# Patient Record
Sex: Female | Born: 1955
Health system: Southern US, Community
[De-identification: ages and names within clinical notes are randomized; demographics above are authoritative.]

## PROBLEM LIST (undated history)

## (undated) DIAGNOSIS — F419 Anxiety disorder, unspecified: Secondary | ICD-10-CM

## (undated) DIAGNOSIS — I10 Essential (primary) hypertension: Secondary | ICD-10-CM

## (undated) DIAGNOSIS — L309 Dermatitis, unspecified: Secondary | ICD-10-CM

## (undated) DIAGNOSIS — M1990 Unspecified osteoarthritis, unspecified site: Secondary | ICD-10-CM

## (undated) DIAGNOSIS — M797 Fibromyalgia: Secondary | ICD-10-CM

## (undated) DIAGNOSIS — T7840XA Allergy, unspecified, initial encounter: Secondary | ICD-10-CM

## (undated) DIAGNOSIS — R011 Cardiac murmur, unspecified: Secondary | ICD-10-CM

## (undated) DIAGNOSIS — S83249A Other tear of medial meniscus, current injury, unspecified knee, initial encounter: Secondary | ICD-10-CM

## (undated) DIAGNOSIS — G43909 Migraine, unspecified, not intractable, without status migrainosus: Secondary | ICD-10-CM

## (undated) DIAGNOSIS — F329 Major depressive disorder, single episode, unspecified: Secondary | ICD-10-CM

## (undated) HISTORY — PX: FINGER SURGERY: SHX640

## (undated) HISTORY — DX: Major depressive disorder, single episode, unspecified: F32.9

## (undated) HISTORY — PX: JOINT REPLACEMENT: SHX530

## (undated) HISTORY — DX: Essential (primary) hypertension: I10

## (undated) HISTORY — DX: Unspecified osteoarthritis, unspecified site: M19.90

## (undated) HISTORY — PX: HERNIA REPAIR: SHX51

## (undated) HISTORY — DX: Dermatitis, unspecified: L30.9

## (undated) HISTORY — DX: Cardiac murmur, unspecified: R01.1

## (undated) HISTORY — DX: Allergy, unspecified, initial encounter: T78.40XA

---

## 1990-07-17 HISTORY — PX: TUBAL LIGATION: SHX77

## 1998-10-04 ENCOUNTER — Ambulatory Visit (HOSPITAL_COMMUNITY): Admission: RE | Admit: 1998-10-04 | Discharge: 1998-10-04 | Payer: Self-pay | Admitting: Preventative Medicine

## 1998-10-04 ENCOUNTER — Encounter: Payer: Self-pay | Admitting: Preventative Medicine

## 1998-10-18 ENCOUNTER — Ambulatory Visit (HOSPITAL_COMMUNITY): Admission: RE | Admit: 1998-10-18 | Discharge: 1998-10-18 | Payer: Self-pay | Admitting: Preventative Medicine

## 1998-10-18 ENCOUNTER — Encounter: Payer: Self-pay | Admitting: Preventative Medicine

## 1998-11-03 ENCOUNTER — Encounter: Payer: Self-pay | Admitting: Preventative Medicine

## 1998-11-03 ENCOUNTER — Ambulatory Visit (HOSPITAL_COMMUNITY): Admission: RE | Admit: 1998-11-03 | Discharge: 1998-11-03 | Payer: Self-pay | Admitting: Preventative Medicine

## 2000-07-17 DIAGNOSIS — F32A Depression, unspecified: Secondary | ICD-10-CM

## 2000-07-17 HISTORY — DX: Depression, unspecified: F32.A

## 2002-07-17 DIAGNOSIS — I10 Essential (primary) hypertension: Secondary | ICD-10-CM

## 2002-07-17 HISTORY — DX: Essential (primary) hypertension: I10

## 2003-01-15 ENCOUNTER — Ambulatory Visit (HOSPITAL_COMMUNITY): Admission: RE | Admit: 2003-01-15 | Discharge: 2003-01-15 | Payer: Self-pay | Admitting: Internal Medicine

## 2003-01-15 ENCOUNTER — Encounter: Payer: Self-pay | Admitting: Internal Medicine

## 2003-12-11 ENCOUNTER — Ambulatory Visit (HOSPITAL_COMMUNITY): Admission: RE | Admit: 2003-12-11 | Discharge: 2003-12-11 | Payer: Self-pay | Admitting: Family Medicine

## 2004-01-19 ENCOUNTER — Ambulatory Visit (HOSPITAL_COMMUNITY): Admission: RE | Admit: 2004-01-19 | Discharge: 2004-01-19 | Payer: Self-pay | Admitting: Family Medicine

## 2004-02-19 ENCOUNTER — Ambulatory Visit (HOSPITAL_COMMUNITY): Admission: RE | Admit: 2004-02-19 | Discharge: 2004-02-19 | Payer: Self-pay | Admitting: General Surgery

## 2004-02-19 LAB — HM COLONOSCOPY: HM Colonoscopy: NORMAL

## 2004-03-30 ENCOUNTER — Ambulatory Visit (HOSPITAL_COMMUNITY): Admission: RE | Admit: 2004-03-30 | Discharge: 2004-03-30 | Payer: Self-pay | Admitting: Family Medicine

## 2004-04-05 ENCOUNTER — Ambulatory Visit (HOSPITAL_COMMUNITY): Admission: RE | Admit: 2004-04-05 | Discharge: 2004-04-05 | Payer: Self-pay | Admitting: General Surgery

## 2004-05-24 ENCOUNTER — Ambulatory Visit: Payer: Self-pay | Admitting: Family Medicine

## 2004-06-23 ENCOUNTER — Ambulatory Visit: Payer: Self-pay | Admitting: Family Medicine

## 2004-07-06 ENCOUNTER — Ambulatory Visit: Payer: Self-pay | Admitting: Orthopedic Surgery

## 2004-08-03 ENCOUNTER — Ambulatory Visit: Payer: Self-pay | Admitting: Orthopedic Surgery

## 2004-08-24 ENCOUNTER — Ambulatory Visit: Payer: Self-pay | Admitting: Orthopedic Surgery

## 2004-09-15 ENCOUNTER — Ambulatory Visit: Payer: Self-pay | Admitting: Orthopedic Surgery

## 2004-09-21 ENCOUNTER — Ambulatory Visit: Payer: Self-pay | Admitting: Family Medicine

## 2004-09-23 ENCOUNTER — Ambulatory Visit (HOSPITAL_COMMUNITY): Admission: RE | Admit: 2004-09-23 | Discharge: 2004-09-23 | Payer: Self-pay | Admitting: Family Medicine

## 2004-10-03 ENCOUNTER — Ambulatory Visit: Payer: Self-pay | Admitting: Orthopedic Surgery

## 2004-10-06 ENCOUNTER — Encounter (HOSPITAL_COMMUNITY): Admission: RE | Admit: 2004-10-06 | Discharge: 2004-11-05 | Payer: Self-pay | Admitting: Orthopedic Surgery

## 2004-10-13 ENCOUNTER — Ambulatory Visit: Payer: Self-pay | Admitting: Orthopedic Surgery

## 2004-10-21 ENCOUNTER — Encounter: Admission: RE | Admit: 2004-10-21 | Discharge: 2004-10-21 | Payer: Self-pay | Admitting: Occupational Medicine

## 2004-11-16 ENCOUNTER — Ambulatory Visit: Payer: Self-pay | Admitting: Orthopedic Surgery

## 2004-11-25 ENCOUNTER — Ambulatory Visit: Payer: Self-pay | Admitting: Orthopedic Surgery

## 2004-11-25 ENCOUNTER — Ambulatory Visit (HOSPITAL_COMMUNITY): Admission: RE | Admit: 2004-11-25 | Discharge: 2004-11-25 | Payer: Self-pay | Admitting: Orthopedic Surgery

## 2004-11-25 HISTORY — PX: SHOULDER ARTHROSCOPY WITH ROTATOR CUFF REPAIR: SHX5685

## 2004-11-28 ENCOUNTER — Ambulatory Visit: Payer: Self-pay | Admitting: Orthopedic Surgery

## 2004-11-29 ENCOUNTER — Encounter (HOSPITAL_COMMUNITY): Admission: RE | Admit: 2004-11-29 | Discharge: 2004-12-14 | Payer: Self-pay | Admitting: Orthopedic Surgery

## 2004-12-02 ENCOUNTER — Ambulatory Visit: Payer: Self-pay | Admitting: Family Medicine

## 2004-12-08 ENCOUNTER — Ambulatory Visit: Payer: Self-pay | Admitting: Orthopedic Surgery

## 2004-12-13 ENCOUNTER — Ambulatory Visit: Payer: Self-pay | Admitting: Family Medicine

## 2004-12-14 ENCOUNTER — Ambulatory Visit (HOSPITAL_COMMUNITY): Admission: RE | Admit: 2004-12-14 | Discharge: 2004-12-14 | Payer: Self-pay | Admitting: Family Medicine

## 2004-12-19 ENCOUNTER — Encounter (HOSPITAL_COMMUNITY): Admission: RE | Admit: 2004-12-19 | Discharge: 2005-01-18 | Payer: Self-pay | Admitting: Orthopedic Surgery

## 2005-01-05 ENCOUNTER — Ambulatory Visit: Payer: Self-pay | Admitting: Orthopedic Surgery

## 2005-01-20 ENCOUNTER — Encounter (HOSPITAL_COMMUNITY): Admission: RE | Admit: 2005-01-20 | Discharge: 2005-02-19 | Payer: Self-pay | Admitting: Orthopedic Surgery

## 2005-02-02 ENCOUNTER — Ambulatory Visit: Payer: Self-pay | Admitting: Orthopedic Surgery

## 2005-03-16 ENCOUNTER — Ambulatory Visit: Payer: Self-pay | Admitting: Orthopedic Surgery

## 2005-04-04 ENCOUNTER — Ambulatory Visit: Payer: Self-pay | Admitting: Family Medicine

## 2005-04-04 ENCOUNTER — Encounter (INDEPENDENT_AMBULATORY_CARE_PROVIDER_SITE_OTHER): Payer: Self-pay | Admitting: *Deleted

## 2005-04-04 LAB — CONVERTED CEMR LAB: Pap Smear: NORMAL

## 2005-06-28 ENCOUNTER — Ambulatory Visit (HOSPITAL_COMMUNITY): Admission: RE | Admit: 2005-06-28 | Discharge: 2005-06-28 | Payer: Self-pay | Admitting: Family Medicine

## 2005-09-12 ENCOUNTER — Ambulatory Visit: Payer: Self-pay | Admitting: Family Medicine

## 2005-11-07 ENCOUNTER — Ambulatory Visit: Payer: Self-pay | Admitting: Family Medicine

## 2005-11-30 ENCOUNTER — Ambulatory Visit: Payer: Self-pay | Admitting: Family Medicine

## 2006-04-11 ENCOUNTER — Encounter (INDEPENDENT_AMBULATORY_CARE_PROVIDER_SITE_OTHER): Payer: Self-pay | Admitting: *Deleted

## 2006-04-11 ENCOUNTER — Ambulatory Visit: Payer: Self-pay | Admitting: Family Medicine

## 2006-04-11 ENCOUNTER — Other Ambulatory Visit: Admission: RE | Admit: 2006-04-11 | Discharge: 2006-04-11 | Payer: Self-pay | Admitting: Family Medicine

## 2006-04-11 LAB — CONVERTED CEMR LAB: Pap Smear: NORMAL

## 2006-07-17 HISTORY — PX: UMBILICAL HERNIA REPAIR: SHX196

## 2006-08-30 ENCOUNTER — Ambulatory Visit: Payer: Self-pay | Admitting: Family Medicine

## 2006-10-15 ENCOUNTER — Ambulatory Visit: Payer: Self-pay | Admitting: Family Medicine

## 2006-10-19 ENCOUNTER — Ambulatory Visit (HOSPITAL_COMMUNITY): Admission: RE | Admit: 2006-10-19 | Discharge: 2006-10-19 | Payer: Self-pay | Admitting: Family Medicine

## 2007-04-05 ENCOUNTER — Encounter: Payer: Self-pay | Admitting: Family Medicine

## 2007-04-05 ENCOUNTER — Encounter (INDEPENDENT_AMBULATORY_CARE_PROVIDER_SITE_OTHER): Payer: Self-pay | Admitting: *Deleted

## 2007-04-05 ENCOUNTER — Other Ambulatory Visit: Admission: RE | Admit: 2007-04-05 | Discharge: 2007-04-05 | Payer: Self-pay | Admitting: Family Medicine

## 2007-04-05 ENCOUNTER — Ambulatory Visit: Payer: Self-pay | Admitting: Family Medicine

## 2007-04-05 LAB — CONVERTED CEMR LAB: Pap Smear: NORMAL

## 2007-04-06 ENCOUNTER — Encounter: Payer: Self-pay | Admitting: Family Medicine

## 2007-04-06 LAB — CONVERTED CEMR LAB
BUN: 14 mg/dL (ref 6–23)
Basophils Absolute: 0 10*3/uL (ref 0.0–0.1)
CO2: 26 meq/L (ref 19–32)
Glucose, Bld: 83 mg/dL (ref 70–99)
HCT: 38 % (ref 36.0–46.0)
Helicobacter Pylori Antibody-IgG: 0.5
Hemoglobin: 12.8 g/dL (ref 12.0–15.0)
MCHC: 33.7 g/dL (ref 30.0–36.0)
Monocytes Relative: 9 % (ref 3–11)
Neutrophils Relative %: 39 % — ABNORMAL LOW (ref 43–77)
Platelets: 374 10*3/uL (ref 150–400)
Triglycerides: 83 mg/dL (ref <150)

## 2007-04-12 ENCOUNTER — Ambulatory Visit: Payer: Self-pay | Admitting: Internal Medicine

## 2007-07-18 HISTORY — PX: SHOULDER ARTHROSCOPY: SHX128

## 2007-08-15 ENCOUNTER — Ambulatory Visit: Payer: Self-pay | Admitting: Family Medicine

## 2007-10-14 ENCOUNTER — Ambulatory Visit: Payer: Self-pay | Admitting: Family Medicine

## 2007-10-18 ENCOUNTER — Encounter (INDEPENDENT_AMBULATORY_CARE_PROVIDER_SITE_OTHER): Payer: Self-pay | Admitting: *Deleted

## 2007-10-18 DIAGNOSIS — G43909 Migraine, unspecified, not intractable, without status migrainosus: Secondary | ICD-10-CM

## 2007-10-18 DIAGNOSIS — I10 Essential (primary) hypertension: Secondary | ICD-10-CM | POA: Insufficient documentation

## 2007-11-01 ENCOUNTER — Ambulatory Visit (HOSPITAL_COMMUNITY): Admission: RE | Admit: 2007-11-01 | Discharge: 2007-11-01 | Payer: Self-pay | Admitting: Family Medicine

## 2007-11-28 ENCOUNTER — Ambulatory Visit: Payer: Self-pay | Admitting: Family Medicine

## 2007-12-03 ENCOUNTER — Encounter (INDEPENDENT_AMBULATORY_CARE_PROVIDER_SITE_OTHER): Payer: Self-pay | Admitting: *Deleted

## 2008-02-11 ENCOUNTER — Telehealth: Payer: Self-pay | Admitting: Family Medicine

## 2008-02-13 ENCOUNTER — Encounter: Payer: Self-pay | Admitting: Family Medicine

## 2008-02-13 ENCOUNTER — Ambulatory Visit: Payer: Self-pay | Admitting: Family Medicine

## 2008-02-14 DIAGNOSIS — M542 Cervicalgia: Secondary | ICD-10-CM

## 2008-02-14 DIAGNOSIS — F32A Depression, unspecified: Secondary | ICD-10-CM | POA: Insufficient documentation

## 2008-02-14 DIAGNOSIS — F329 Major depressive disorder, single episode, unspecified: Secondary | ICD-10-CM

## 2008-03-09 ENCOUNTER — Telehealth: Payer: Self-pay | Admitting: Family Medicine

## 2008-03-11 ENCOUNTER — Ambulatory Visit: Payer: Self-pay | Admitting: Family Medicine

## 2008-03-11 DIAGNOSIS — H547 Unspecified visual loss: Secondary | ICD-10-CM

## 2008-03-13 ENCOUNTER — Telehealth: Payer: Self-pay | Admitting: Family Medicine

## 2008-03-25 ENCOUNTER — Ambulatory Visit: Payer: Self-pay | Admitting: Family Medicine

## 2008-03-26 ENCOUNTER — Encounter: Payer: Self-pay | Admitting: Family Medicine

## 2008-03-31 DIAGNOSIS — D259 Leiomyoma of uterus, unspecified: Secondary | ICD-10-CM | POA: Insufficient documentation

## 2008-04-28 ENCOUNTER — Other Ambulatory Visit: Admission: RE | Admit: 2008-04-28 | Discharge: 2008-04-28 | Payer: Self-pay | Admitting: Obstetrics and Gynecology

## 2008-05-01 ENCOUNTER — Encounter: Payer: Self-pay | Admitting: Family Medicine

## 2008-05-01 LAB — CONVERTED CEMR LAB
CO2: 25 meq/L (ref 19–32)
Calcium: 10.2 mg/dL (ref 8.4–10.5)
Creatinine, Ser: 0.9 mg/dL (ref 0.40–1.20)
Eosinophils Relative: 2 % (ref 0–5)
Glucose, Bld: 83 mg/dL (ref 70–99)
HCT: 37 % (ref 36.0–46.0)
Hemoglobin: 12.8 g/dL (ref 12.0–15.0)
Lymphocytes Relative: 40 % (ref 12–46)
Lymphs Abs: 3.5 10*3/uL (ref 0.7–4.0)
Monocytes Absolute: 0.8 10*3/uL (ref 0.1–1.0)
WBC: 8.8 10*3/uL (ref 4.0–10.5)

## 2008-05-04 ENCOUNTER — Ambulatory Visit: Payer: Self-pay | Admitting: Family Medicine

## 2008-05-04 DIAGNOSIS — R079 Chest pain, unspecified: Secondary | ICD-10-CM | POA: Insufficient documentation

## 2008-05-05 ENCOUNTER — Ambulatory Visit (HOSPITAL_COMMUNITY): Admission: RE | Admit: 2008-05-05 | Discharge: 2008-05-05 | Payer: Self-pay | Admitting: Obstetrics and Gynecology

## 2008-05-11 ENCOUNTER — Telehealth: Payer: Self-pay | Admitting: Family Medicine

## 2008-05-19 ENCOUNTER — Telehealth: Payer: Self-pay | Admitting: Family Medicine

## 2008-09-30 ENCOUNTER — Telehealth: Payer: Self-pay | Admitting: Family Medicine

## 2008-10-02 ENCOUNTER — Encounter: Payer: Self-pay | Admitting: Family Medicine

## 2008-10-05 ENCOUNTER — Ambulatory Visit: Payer: Self-pay | Admitting: Family Medicine

## 2008-10-05 DIAGNOSIS — G5600 Carpal tunnel syndrome, unspecified upper limb: Secondary | ICD-10-CM | POA: Insufficient documentation

## 2008-10-07 ENCOUNTER — Encounter (INDEPENDENT_AMBULATORY_CARE_PROVIDER_SITE_OTHER): Payer: Self-pay | Admitting: *Deleted

## 2008-10-09 ENCOUNTER — Encounter: Payer: Self-pay | Admitting: Family Medicine

## 2008-11-06 ENCOUNTER — Encounter: Payer: Self-pay | Admitting: Family Medicine

## 2008-11-10 ENCOUNTER — Telehealth: Payer: Self-pay | Admitting: Family Medicine

## 2008-11-13 ENCOUNTER — Ambulatory Visit (HOSPITAL_COMMUNITY): Admission: RE | Admit: 2008-11-13 | Discharge: 2008-11-13 | Payer: Self-pay | Admitting: Family Medicine

## 2008-11-13 ENCOUNTER — Ambulatory Visit: Payer: Self-pay | Admitting: Family Medicine

## 2008-11-13 DIAGNOSIS — M549 Dorsalgia, unspecified: Secondary | ICD-10-CM | POA: Insufficient documentation

## 2009-01-29 ENCOUNTER — Encounter: Payer: Self-pay | Admitting: Gastroenterology

## 2009-02-05 ENCOUNTER — Ambulatory Visit (HOSPITAL_COMMUNITY): Admission: RE | Admit: 2009-02-05 | Discharge: 2009-02-05 | Payer: Self-pay | Admitting: Gastroenterology

## 2009-02-05 ENCOUNTER — Ambulatory Visit: Payer: Self-pay | Admitting: Gastroenterology

## 2009-02-05 ENCOUNTER — Encounter: Payer: Self-pay | Admitting: Family Medicine

## 2009-02-05 HISTORY — PX: COLONOSCOPY: SHX174

## 2009-04-23 ENCOUNTER — Encounter: Payer: Self-pay | Admitting: Family Medicine

## 2009-04-23 ENCOUNTER — Ambulatory Visit: Payer: Self-pay | Admitting: Family Medicine

## 2009-04-23 ENCOUNTER — Other Ambulatory Visit: Admission: RE | Admit: 2009-04-23 | Discharge: 2009-04-23 | Payer: Self-pay | Admitting: Family Medicine

## 2009-04-23 LAB — CONVERTED CEMR LAB
Chlamydia, DNA Probe: NEGATIVE
GC Probe Amp, Genital: NEGATIVE

## 2009-04-24 ENCOUNTER — Encounter: Payer: Self-pay | Admitting: Family Medicine

## 2009-04-24 LAB — CONVERTED CEMR LAB: Gardnerella vaginalis: NEGATIVE

## 2009-05-06 ENCOUNTER — Encounter (INDEPENDENT_AMBULATORY_CARE_PROVIDER_SITE_OTHER): Payer: Self-pay | Admitting: Family Medicine

## 2009-05-07 ENCOUNTER — Ambulatory Visit: Payer: Self-pay | Admitting: Family Medicine

## 2009-05-07 ENCOUNTER — Ambulatory Visit (HOSPITAL_COMMUNITY): Admission: RE | Admit: 2009-05-07 | Discharge: 2009-05-07 | Payer: Self-pay | Admitting: Family Medicine

## 2009-05-18 ENCOUNTER — Ambulatory Visit (HOSPITAL_COMMUNITY): Admission: RE | Admit: 2009-05-18 | Discharge: 2009-05-18 | Payer: Self-pay | Admitting: Family Medicine

## 2009-11-08 ENCOUNTER — Encounter: Payer: Self-pay | Admitting: Physician Assistant

## 2009-11-08 ENCOUNTER — Ambulatory Visit: Payer: Self-pay | Admitting: Family Medicine

## 2009-11-08 DIAGNOSIS — J310 Chronic rhinitis: Secondary | ICD-10-CM | POA: Insufficient documentation

## 2010-02-10 ENCOUNTER — Ambulatory Visit: Payer: Self-pay | Admitting: Family Medicine

## 2010-02-10 DIAGNOSIS — IMO0001 Reserved for inherently not codable concepts without codable children: Secondary | ICD-10-CM | POA: Insufficient documentation

## 2010-02-11 ENCOUNTER — Encounter: Payer: Self-pay | Admitting: Family Medicine

## 2010-02-14 LAB — CONVERTED CEMR LAB
Basophils Absolute: 0 10*3/uL (ref 0.0–0.1)
Eosinophils Absolute: 0.1 10*3/uL (ref 0.0–0.7)
Glucose, Bld: 99 mg/dL (ref 70–99)
Lymphs Abs: 2.1 10*3/uL (ref 0.7–4.0)
MCV: 89.8 fL (ref 78.0–100.0)
Neutrophils Relative %: 58 % (ref 43–77)
Platelets: 325 10*3/uL (ref 150–400)
Potassium: 4.3 meq/L (ref 3.5–5.3)
Sodium: 141 meq/L (ref 135–145)
TSH: 2.33 microintl units/mL (ref 0.350–4.500)
Vit D, 25-Hydroxy: 60 ng/mL (ref 30–89)
WBC: 6.4 10*3/uL (ref 4.0–10.5)

## 2010-02-15 ENCOUNTER — Telehealth: Payer: Self-pay | Admitting: Family Medicine

## 2010-02-18 ENCOUNTER — Ambulatory Visit (HOSPITAL_COMMUNITY): Admission: RE | Admit: 2010-02-18 | Discharge: 2010-02-18 | Payer: Self-pay | Admitting: Family Medicine

## 2010-02-21 ENCOUNTER — Telehealth: Payer: Self-pay | Admitting: Family Medicine

## 2010-03-04 ENCOUNTER — Encounter: Payer: Self-pay | Admitting: Family Medicine

## 2010-03-09 ENCOUNTER — Encounter: Payer: Self-pay | Admitting: Family Medicine

## 2010-04-08 ENCOUNTER — Encounter: Payer: Self-pay | Admitting: Family Medicine

## 2010-06-03 ENCOUNTER — Encounter: Payer: Self-pay | Admitting: Family Medicine

## 2010-06-17 ENCOUNTER — Other Ambulatory Visit
Admission: RE | Admit: 2010-06-17 | Discharge: 2010-06-17 | Payer: Self-pay | Source: Home / Self Care | Admitting: Family Medicine

## 2010-06-17 ENCOUNTER — Ambulatory Visit: Payer: Self-pay | Admitting: Family Medicine

## 2010-06-17 LAB — CONVERTED CEMR LAB: OCCULT 1: NEGATIVE

## 2010-06-21 ENCOUNTER — Encounter: Payer: Self-pay | Admitting: Family Medicine

## 2010-06-21 LAB — CONVERTED CEMR LAB
LDL Cholesterol: 128 mg/dL — ABNORMAL HIGH (ref 0–99)
Triglycerides: 111 mg/dL (ref <150)

## 2010-06-22 ENCOUNTER — Encounter: Payer: Self-pay | Admitting: Family Medicine

## 2010-06-22 LAB — CONVERTED CEMR LAB: Pap Smear: NEGATIVE

## 2010-08-06 ENCOUNTER — Encounter: Payer: Self-pay | Admitting: Family Medicine

## 2010-08-07 ENCOUNTER — Encounter: Payer: Self-pay | Admitting: Family Medicine

## 2010-08-08 ENCOUNTER — Encounter: Payer: Self-pay | Admitting: Family Medicine

## 2010-08-16 NOTE — Assessment & Plan Note (Signed)
Summary: office visit   Vital Signs:  Patient profile:   55 year old female Menstrual status:  postmenopausal Height:      63 inches Weight:      150.75 pounds BMI:     26.80 O2 Sat:      100 % on Room air Pulse rate:   82 / minute Pulse rhythm:   regular Resp:     16 per minute BP sitting:   160 / 90  (left arm)  Vitals Entered By: Adella Hare LPN (February 10, 2010 3:25 PM)  Nutrition Counseling: Patient's BMI is greater than 25 and therefore counseled on weight management options.  O2 Flow:  Room air CC: migraine the other day, bp up Is Patient Diabetic? No Comments patient admitts to not taking her medication   CC:  migraine the other day and bp up.  History of Present Illness: Pt reports she has not been doing well, takes no med, states they causing more pain than any help. Constant daily pain in left hand, rated at 7 since past 2 yrs , she has an established dx of bilateral carpal tunnel since over 1 year, left worse than right , she has opted not to have surgery.  She has occipital neck pain, relaxation and gentle exercise provides relief for 6 months.  Low back pain intermittently for th e past 2 yrs(Oct 2009)Heaviness  in both thighs to the knees Tingling intermittently in the left foot since Nov 2009.  Intermittent bilateral shoulder pain, primarily assocd with heavy lifting Pt reports that since the past 1 week , she developed fatigue and generalised pains, felt light headed and dizzy, yesterday she was too weak to do anything, did not go to work, she has had  severe nausea, no diarereah , fever or chills  Allergies (verified): No Known Drug Allergies  Review of Systems      See HPI General:  Complains of fatigue, malaise, sleep disorder, and weakness; denies chills and fever; reports poor sleep , difficulty both falling and staying asleep. Eyes:  Denies blurring and discharge. ENT:  Denies earache, hoarseness, nasal congestion, postnasal drainage, and sinus  pressure. CV:  Denies chest pain or discomfort, palpitations, and swelling of feet. Resp:  Denies cough and sputum productive. GI:  Complains of nausea; denies abdominal pain, constipation, diarrhea, and vomiting. GU:  Denies dysuria and urinary frequency. MS:  Complains of joint pain, muscle weakness, and stiffness. Neuro:  Complains of headaches. Psych:  Complains of anxiety and depression; denies suicidal thoughts/plans, thoughts of violence, and unusual visions or sounds. Endo:  Denies excessive thirst and excessive urination. Heme:  Denies abnormal bruising and bleeding. Allergy:  Denies hives or rash and itching eyes.  Physical Exam  General:  Well-developed,well-nourished,in no acute distress; alert,appropriate and cooperative throughout examination. pt anxious and somewhat depressed appearing, and in pain HEENT: No facial asymmetry,  EOMI, No sinus tenderness, TM's Clear, oropharynx  pink and moist.   Chest: Clear to auscultation bilaterally.  CVS: S1, S2, No murmurs, No S3.   Abd: Soft, Nontender.  MS: Adequate ROM spine, hips, shoulders and knees. Tender top palapation at back of neck and elbows Ext: No edema.   CNS: CN 2-12 intact, power tone and sensation normal throughout.   Skin: Intact, no visible lesions or rashes.  Psych: Good eye contact, normal affect.  Memory intact, not anxious or depressed appearing.    Impression & Recommendations:  Problem # 1:  UNSPECIFIED MYALGIA AND MYOSITIS (ICD-729.1) Assessment Deteriorated  Her updated medication list for this problem includes:    Hydrocodone-acetaminophen 5-500 Mg Tabs (Hydrocodone-acetaminophen) .Marland Kitchen... Take 1 every 4-6 hrs as needed for headache  Orders: Rheumatology Referral (Rheumatology)  Problem # 2:  DEPRESSIVE DISORDER NOT ELSEWHERE CLASSIFIED (ICD-311) Assessment: Deteriorated pt refusing antidepressantsat this time, not suicidal, homicidal or hallucinating  Problem # 3:  HYPERTENSION  (ICD-401.9) Assessment: Unchanged  The following medications were removed from the medication list:    Maxzide-25 37.5-25 Mg Tabs (Triamterene-hctz) ..... One and half daily, stopped taking stated she was intolerant of the med Her updated medication list for this problem includes:    Uniretic 15-12.5 Mg Tabs (Moexipril-hydrochlorothiazide) .Marland Kitchen... Take 1 tablet by mouth once a day  Orders: T-Basic Metabolic Panel [id number])  BP today: 160/90 Prior BP: 160/90 (11/08/2009)  Labs Reviewed: K+: 4.5 (05/01/2008) Creat: : 0.90 (05/01/2008)   Chol: 200 (05/01/2008)   HDL: 57 (05/01/2008)   LDL: 127 (05/01/2008)   TG: 79 (05/01/2008)  Complete Medication List: 1)  Benzaclin 1-5 % Gel (Clindamycin phos-benzoyl perox) .... Apply topically to affected area at bedtime 2)  Sumatriptan Succinate 100 Mg Tabs (Sumatriptan succinate) .... Take 1 tab at onset of migraine.  you may take a 2nd tab 2 hrs later if migraine is not completely resolved. max 2 tabs per 24 hrs. 3)  Hydrocodone-acetaminophen 5-500 Mg Tabs (Hydrocodone-acetaminophen) .... Take 1 every 4-6 hrs as needed for headache 4)  Uniretic 15-12.5 Mg Tabs (Moexipril-hydrochlorothiazide) .... Take 1 tablet by mouth once a day 5)  Temazepam 7.5 Mg Caps (Temazepam) .... Take 1 capsule by mouth at bedtime  Other Orders: T-CBC w/Diff [id number]) T-TSH [phone]) T-Vitamin D (25-Hydroxy) [phone]) Radiology Referral (Radiology) Ophthalmology Referral (Ophthalmology)  Patient Instructions: 1)  CPE  in 5 to 6 weeks. 2)  BMP prior to visit, ICD-9: 3)  TSH prior to visit, ICD-9:  today 4)  CBC w/ Diff prior to visit, ICD-9: 5)  Vit D 6)  You will be referred to a rheumartologist and also for  a mamogram. 7)  Your bP is high, you need to take the new med prescribed that you requested. 8)  You wll start a med for sleep , pls practiice good sleep hygiene 9)  Work excuse from 7/27 to return 02/13/2010 Prescriptions: TEMAZEPAM 7.5 MG  CAPS (TEMAZEPAM) Take 1 capsule by mouth at bedtime  #30 x 2   Entered and Authorized by:   Syliva Overman MD   Signed by:   Syliva Overman MD on 02/10/2010   Method used:   Printed then faxed to ...       [facility] [facility] (retail)       [address]       [place], [place]  [address]       Ph: [phone]       Fax: [phone]   RxID:   [id number] Earl Gala 15-12.5 MG TABS (MOEXIPRIL-HYDROCHLOROTHIAZIDE) Take 1 tablet by mouth once a day  #30 x 2   Entered and Authorized by:   Syliva Overman MD   Signed by:   Syliva Overman MD on 02/10/2010   Method used:   Electronically to        [facility]  [address] (retail)       [address]       [place], [place]  [address]       Ph: [phone]  Fax: [phone]   RxID:   [id number]

## 2010-08-16 NOTE — Letter (Signed)
Summary: mammogram appointment  mammogram appointment   Imported By: Curtis Sites 02/15/2010 13:39:46  _____________________________________________________________________  External Attachment:    Type:   Image     Comment:   External Document

## 2010-08-16 NOTE — Letter (Signed)
Summary: DR.TRUSLOW  DR.TRUSLOW   Imported By: Lind Guest 03/15/2010 08:53:29  _____________________________________________________________________  External Attachment:    Type:   Image     Comment:   External Document

## 2010-08-16 NOTE — Letter (Signed)
Summary: Out of Work  [facility]  [address]   [place], [place] [address]   Phone: [phone]  Fax: [phone]    February 10, 2010   Employee:  [employer]    To Whom It May Concern:   For Medical reasons, please excuse the above named employee from work for the following dates:  Start:   02/09/10  End:   02/13/10 to return with no restrictions  If you need additional information, please feel free to contact our office.         Sincerely,    Syliva Overman, MD

## 2010-08-16 NOTE — Letter (Signed)
Summary: 1st missed appt. letter  1st missed appt. letter   Imported By: Lind Guest 06/03/2010 08:42:23  _____________________________________________________________________  External Attachment:    Type:   Image     Comment:   External Document

## 2010-08-16 NOTE — Miscellaneous (Signed)
  Clinical Lists Changes  Medications: Added new medication of ALPRAZOLAM 0.5 MG TABS (ALPRAZOLAM) one tab by mouth two times a day as needed

## 2010-08-16 NOTE — Letter (Signed)
Summary: Letter  Letter   Imported By: Lind Guest 06/21/2010 09:19:45  _____________________________________________________________________  External Attachment:    Type:   Image     Comment:   External Document

## 2010-08-16 NOTE — Assessment & Plan Note (Signed)
Summary: neck pain- room 1   Vital Signs:  Patient profile:   55 year old female Menstrual status:  postmenopausal Height:      63 inches Weight:      148.75 pounds BMI:     26.45 O2 Sat:      98 % on Room air Pulse rate:   100 / minute Resp:     16 per minute BP sitting:   160 / 90  (left arm)  Vitals Entered By: Adella Hare LPN (November 08, 2009 2:18 PM)  Serial Vital Signs/Assessments:  Time      Position  BP       Pulse  Resp  Temp     By                     164/100                        Esperanza Sheets PA  CC: migraine since thursday, Headache Is Patient Diabetic? No Pain Assessment Patient in pain? yes     Location: neck Intensity: 3 Type: sharp Onset of pain  Intermittent Comments patient admits she does not take bp med as prescribed   CC:  migraine since thursday and Headache.  History of Present Illness: Hx of migraines. Usually monthly.  Has had them for  approx 19 yrs. Has been menopausal since 2007  but still gets the monthly migraines.  This time has had the headaches off & on x 3 weeks.  Has been using various over the counter pain relievers for the last 3 weeks.  States they only provide some relief.  Rx triptans have worked well in the past. + photophobia + phonophobia  Hx of htn. Not taking medications as prescribed. No headache, chest pain or palpitations.  Has daily congestion, which she states is due to the cold air at work blowing on her.  She brings with her today various cold medications that she takes daily to help with the congestion. States prescription nasal sprays, etc not help.  Pt states she doesn't take her BP meds because the cold medicines raise her BP anyway.     Current Medications (verified): 1)  Maxzide-25 37.5-25 Mg Tabs (Triamterene-Hctz) .... One and Half Daily  Allergies (verified): No Known Drug Allergies  Past History:  Past medical history reviewed for relevance to current acute and chronic problems.  Past Medical  History: Reviewed history from 10/18/2007 and no changes required. Current Problems:  OTITIS MEDIA (ICD-382.9) HYPERTENSION (ICD-401.9) MIGRAINE HEADACHE (ICD-346.90) .  Review of Systems General:  Denies chills and fever. ENT:  Complains of nasal congestion; denies earache, postnasal drainage, sinus pressure, and sore throat. CV:  Denies chest pain or discomfort. Resp:  Denies cough and shortness of breath. GI:  Denies nausea and vomiting. Neuro:  Complains of headaches; denies numbness, tingling, and visual disturbances.  Physical Exam  General:  Well-developed,well-nourished,in no acute distress; alert,appropriate and cooperative throughout examination Head:  Normocephalic and atraumatic without obvious abnormalities. No apparent alopecia or balding. Eyes:  No corneal or conjunctival inflammation noted. EOMI. Perrla. Funduscopic exam benign, without hemorrhages, exudates or papilledema. Vision grossly normal. Ears:  External ear exam shows no significant lesions or deformities.  Otoscopic examination reveals clear canals, tympanic membranes are intact bilaterally without bulging, retraction, inflammation or discharge. Hearing is grossly normal bilaterally. Nose:  External nasal examination shows no deformity or inflammation. Nasal mucosa are pink and moist without lesions or  exudates. Mouth:  Oral mucosa and oropharynx without lesions or exudates.  Teeth in good repair. Neck:  No deformities, masses, or tenderness noted.no thyromegaly.   Lungs:  Normal respiratory effort, chest expands symmetrically. Lungs are clear to auscultation, no crackles or wheezes. Heart:  Normal rate and regular rhythm. S1 and S2 normal without gallop, murmur, click, rub or other extra sounds. Neurologic:  alert & oriented X3, cranial nerves II-XII intact, strength normal in all extremities, and DTRs symmetrical and normal.   Cervical Nodes:  No lymphadenopathy noted Psych:  Cognition and judgment appear  intact. Alert and cooperative with normal attention span and concentration. No apparent delusions, illusions, hallucinations   Impression & Recommendations:  Problem # 1:  MIGRAINE HEADACHE (ICD-346.90) Assessment Deteriorated Discussed rebound headaches from over the counter pain relievers.  Advised pt that if she needs an over the counter pain reliever to use Aleve.  She should avoid Tylenol or ibuprofen if needing to use them > 2 days/wk. Also discussed that her uncontrolled htn may be contributing to headaches.  The following medications were removed from the medication list:    Ibuprofen 800 Mg Tabs (Ibuprofen) .Marland Kitchen... Take 1 tablet by mouth two times a day  i Her updated medication list for this problem includes:    Sumatriptan Succinate 100 Mg Tabs (Sumatriptan succinate) .Marland Kitchen... Take 1 tab at onset of migraine.  you may take a 2nd tab 2 hrs later if migraine is not completely resolved. max 2 tabs per 24 hrs.    Hydrocodone-acetaminophen 5-500 Mg Tabs (Hydrocodone-acetaminophen) .Marland Kitchen... Take 1 every 4-6 hrs as needed for headache  Orders: Ketorolac-Toradol 15mg  (H0865) Admin of Therapeutic Inj  intramuscular or subcutaneous (78469)  Problem # 2:  RHINITIS (ICD-472.0) Assessment: Unchanged Discussed with pt that she must avoid the over the counter cold medications due to her htn. She may continue Ziacam nasal spray, and may use any over the counter cold meds that say for high blood pressure.  Problem # 3:  HYPERTENSION (ICD-401.9) Assessment: Deteriorated Discussed importance of medication compliance.   Discussed risk of uncontrolled htn, including kidney disease, heart disease, heart attack & stroke.  The following medications were removed from the medication list:    Amlodipine Besylate 5 Mg Tabs (Amlodipine besylate) ..... One tab by mouth qd Her updated medication list for this problem includes:    Maxzide-25 37.5-25 Mg Tabs (Triamterene-hctz) ..... One and half daily  BP  today: 160/90 Prior BP: 122/84 (05/07/2009)  Labs Reviewed: K+: 4.5 (05/01/2008) Creat: : 0.90 (05/01/2008)   Chol: 200 (05/01/2008)   HDL: 57 (05/01/2008)   LDL: 127 (05/01/2008)   TG: 79 (05/01/2008)  Complete Medication List: 1)  Benzaclin 1-5 % Gel (Clindamycin phos-benzoyl perox) .... Apply topically to affected area at bedtime 2)  Maxzide-25 37.5-25 Mg Tabs (Triamterene-hctz) .... One and half daily 3)  Sumatriptan Succinate 100 Mg Tabs (Sumatriptan succinate) .... Take 1 tab at onset of migraine.  you may take a 2nd tab 2 hrs later if migraine is not completely resolved. max 2 tabs per 24 hrs. 4)  Hydrocodone-acetaminophen 5-500 Mg Tabs (Hydrocodone-acetaminophen) .... Take 1 every 4-6 hrs as needed for headache  Patient Instructions: 1)  Please schedule a follow-up appointment in 2 months. 2)  Restart your blood pressure medication. 3)  Avoid over the counter cold medications with decongestants.  You may take over the counter cold medications that say for use with high blood pressure. 4)  Use aleve as needed for pain.  You may  take 1 or 2 tabs two times a day as needed. 5)  I have prescribed Hydrocodone for you to use today for headache due to your high blood pressure today. 6)  I have also prescribed sumatriptan (Imitrex) for migraines.  You may use this after you restart your blood pressure meds. 7)  Check your Blood Pressure regularly. If it is above 160/100 you should make an appointment. Prescriptions: BENZACLIN 1-5 % GEL (CLINDAMYCIN PHOS-BENZOYL PEROX) Apply topically to affected area at bedtime  #30 x 2   Entered by:   Adella Hare LPN   Authorized by:   Esperanza Sheets PA   Signed by:   Adella Hare LPN on 04/54/0981   Method used:   Electronically to        [facility]  [address] (retail)       [address]       [place], [place]  [address]       Ph: [phone]       Fax: [phone]   RxID:   [id number] HYDROCODONE-ACETAMINOPHEN 5-500 MG TABS  (HYDROCODONE-ACETAMINOPHEN) take 1 every 4-6 hrs as needed for headache  #10 x 0   Entered and Authorized by:   Esperanza Sheets PA   Signed by:   Esperanza Sheets PA on 11/08/2009   Method used:   Printed then faxed to ...       [facility]  [address] [facility] (retail)       [address]       [place], [place]  [address]       Ph: [phone]       Fax: [phone]   RxID:   [id number] SUMATRIPTAN SUCCINATE 100 MG TABS (SUMATRIPTAN SUCCINATE) take 1 tab at onset of migraine.  You may take a 2nd tab 2 hrs later if migraine is not completely resolved. Max 2 tabs per 24 hrs.  #6 x 2   Entered and Authorized by:   Esperanza Sheets PA   Signed by:   Esperanza Sheets PA on 11/08/2009   Method used:   Electronically to        [facility]  [address] (retail)       [address]       [place], [place]  [address]       Ph: [phone]       Fax: [phone]   RxID:   [id number]    Medication Administration  Injection # 1:    Medication: Ketorolac-Toradol 15mg     Diagnosis: MIGRAINE HEADACHE (ICD-346.90)    Route: IM    Site: LUOQ gluteus    Exp Date: 06/17/2011    Lot #: [id number]    Mfr: hospira    Comments: toradol 60mg  given    Patient tolerated injection without complications    Given by: Adella Hare LPN (November 08, 2009 3:30 PM)  Orders Added: 1)  Ketorolac-Toradol 15mg  [J1885] 2)  Admin of Therapeutic Inj  intramuscular or subcutaneous [96372] 3)  Est. Patient Level IV [27035]

## 2010-08-16 NOTE — Letter (Signed)
Summary: Work Excuse  [facility]  [address]   [place], [place] [address]   Phone: [phone]  Fax: [phone]    Today's Date: November 08, 2009  Name of Patient: Shirley Bush  The above named patient had a medical visit today at: 2:00 pm.  Please take this into consideration when reviewing the time away from work/school.    Special Instructions:  [  ] None  [ x ] To be off the remainder of today, returning to the normal work / school schedule tomorrow.  [  ] To be off until the next scheduled appointment on ______________________.  [ X ] Other also excuse from work on 4/21/11________________________________________________________________   Sincerely yours,   Esperanza Sheets PA

## 2010-08-16 NOTE — Progress Notes (Signed)
Summary: referral  Phone Note Call from Patient   Summary of Call: pt has appt with dr. Jiles Garter office. They will see pt on 03/04/2010 10:30. called and left message for pt to call office visit Initial call taken by: Rudene Anda,  February 21, 2010 11:01 AM

## 2010-08-16 NOTE — Assessment & Plan Note (Signed)
Summary: PHY   Vital Signs:  Patient profile:   55 year old female Menstrual status:  postmenopausal Height:      63 inches Weight:      148.25 pounds BMI:     26.36 O2 Sat:      98 % on Room air Pulse rate:   85 / minute Pulse rhythm:   regular Resp:     16 per minute BP supine:   130 / 82  (right arm) BP sitting:   144 / 92  (left arm)  Vitals Entered By: Adella Hare LPN (June 17, 2010 10:31 AM)  Nutrition Counseling: Patient's BMI is greater than 25 and therefore counseled on weight management options.  O2 Flow:  Room air CC: physical Is Patient Diabetic? No Pain Assessment Patient in pain? no       Vision Screening:Left eye w/o correction: 20 / 50 Right Eye w/o correction: 20 / 25 Both eyes w/o correction:  20/ 30        Vision Entered By: Adella Hare LPN (June 17, 2010 10:33 AM)   CC:  physical.  History of Present Illness: Reports  that she is doing fairly well.She has recently been diagnised with fibromyalgia, and c/o neck, shoulder and generalised joint pains. She states she is now contemplating applying for disability. Denies recent fever or chills. Denies sinus pressure, nasal congestion , ear pain or sore throat. Denies chest congestion, or cough productive of sputum. Denies chest pain, palpitations, PND, orthopnea or leg swelling. Denies abdominal pain, nausea, vomitting, diarrhea or constipation. Denies change in bowel movements or bloody stool. Denies dysuria , frequency, incontinence or hesitancy.  Denies headaches, vertigo, seizures. Denies depression or  anxiety, she does have mild  insomnia. Denies  rash, lesions, or itch.     Current Medications (verified): 1)  Benzaclin 1-5 % Gel (Clindamycin Phos-Benzoyl Perox) .... Apply Topically To Affected Area At Bedtime 2)  Uniretic 15-12.5 Mg Tabs (Moexipril-Hydrochlorothiazide) .... Take 1 Tablet By Mouth Once A Day 3)  Cyclobenzaprine Hcl 10 Mg Tabs (Cyclobenzaprine Hcl) .Marland Kitchen.. 1-2  Tabs By Mouth At Bedtime As Needed  Allergies (verified): No Known Drug Allergies  Past History:  Past Medical History: Current Problems:  OTITIS MEDIA (ICD-382.9) HYPERTENSION (ICD-401.9) MIGRAINE HEADACHE (ICD-346.90) fibromyalgia syndrome  2011 .  Review of Systems      See HPI Eyes:  Denies discharge, eye pain, and red eye. ENT:  Denies hoarseness, nasal congestion, and nosebleeds. MS:  Complains of joint pain, joint swelling, and stiffness. Endo:  Denies cold intolerance, excessive thirst, excessive urination, and heat intolerance. Heme:  Denies abnormal bruising and bleeding. Allergy:  Denies hives or rash and itching eyes.  Physical Exam  General:  Well-developed,well-nourished,in no acute distress; alert,appropriate and cooperative throughout examination Head:  Normocephalic and atraumatic without obvious abnormalities. No apparent alopecia or balding. Eyes:  No corneal or conjunctival inflammation noted. EOMI. Perrla. Funduscopic exam benign, without hemorrhages, exudates or papilledema. Vision grossly normal. Ears:  External ear exam shows no significant lesions or deformities.  Otoscopic examination reveals clear canals, tympanic membranes are intact bilaterally without bulging, retraction, inflammation or discharge. Hearing is grossly normal bilaterally. Nose:  External nasal examination shows no deformity or inflammation. Nasal mucosa are pink and moist without lesions or exudates. Mouth:  Oral mucosa and oropharynx without lesions or exudates.  Teeth in good repair. Neck:  No deformities, masses, or tenderness noted. Chest Wall:  No deformities, masses, or tenderness noted. Breasts:  No mass, nodules,  thickening, tenderness, bulging, retraction, inflamation, nipple discharge or skin changes noted.   Lungs:  Normal respiratory effort, chest expands symmetrically. Lungs are clear to auscultation, no crackles or wheezes. Heart:  Normal rate and regular rhythm. S1 and S2  normal without gallop, murmur, click, rub or other extra sounds. Abdomen:  Bowel sounds positive,abdomen soft and non-tender without masses, organomegaly or hernias noted. Rectal:  No external abnormalities noted. Normal sphincter tone. No rectal masses or tenderness. Genitalia:  Normal introitus for age, no external lesions, no vaginal discharge, mucosa pink and moist, no vaginal or cervical lesions, no vaginal atrophy, no friaility or hemorrhage, enlarged uterus size , no adnexal masses or tenderness Msk:  No deformity or scoliosis noted of thoracic or lumbar spine.   Pulses:  R and L carotid,radial,femoral,dorsalis pedis and posterior tibial pulses are full and equal bilaterally Extremities:  No clubbing, cyanosis, edema, or deformity noted with normal full range of motion of all joints, pt does have back stiffness   Neurologic:  No cranial nerve deficits noted. Station and gait are normal. Plantar reflexes are down-going bilaterally. DTRs are symmetrical throughout. Sensory, motor and coordinative functions appear intact.   Impression & Recommendations:  Problem # 1:  UNSPECIFIED MYALGIA AND MYOSITIS (ICD-729.1) Assessment Unchanged  The following medications were removed from the medication list:    Hydrocodone-acetaminophen 5-500 Mg Tabs (Hydrocodone-acetaminophen) .Marland Kitchen... Take 1 every 4-6 hrs as needed for headache Her updated medication list for this problem includes:    Cyclobenzaprine Hcl 10 Mg Tabs (Cyclobenzaprine hcl) .Marland Kitchen... 1-2 tabs by mouth at bedtime as needed  Problem # 2:  HYPERTENSION (ICD-401.9) Assessment: Improved  Her updated medication list for this problem includes:    Uniretic 15-12.5 Mg Tabs (Moexipril-hydrochlorothiazide) .Marland Kitchen... Take 1 tablet by mouth once a day  BP today: 144/92 Prior BP: 160/90 (02/10/2010)  Labs Reviewed: K+: 4.3 (02/10/2010) Creat: : 0.76 (02/10/2010)   Chol: 200 (05/01/2008)   HDL: 57 (05/01/2008)   LDL: 127 (05/01/2008)   TG: 79  (05/01/2008)  Problem # 3:  NECK AND BACK PAIN (ICD-723.1) Assessment: Unchanged  The following medications were removed from the medication list:    Hydrocodone-acetaminophen 5-500 Mg Tabs (Hydrocodone-acetaminophen) .Marland Kitchen... Take 1 every 4-6 hrs as needed for headache Her updated medication list for this problem includes:    Cyclobenzaprine Hcl 10 Mg Tabs (Cyclobenzaprine hcl) .Marland Kitchen... 1-2 tabs by mouth at bedtime as needed  Complete Medication List: 1)  Benzaclin 1-5 % Gel (Clindamycin phos-benzoyl perox) .... Apply topically to affected area at bedtime 2)  Uniretic 15-12.5 Mg Tabs (Moexipril-hydrochlorothiazide) .... Take 1 tablet by mouth once a day 3)  Cyclobenzaprine Hcl 10 Mg Tabs (Cyclobenzaprine hcl) .Marland Kitchen.. 1-2 tabs by mouth at bedtime as needed  Other Orders: Ophthalmology Referral (Ophthalmology) T-Lipid Profile [phone]) Pap Smear (09811) Hemoccult Guaiac-1 spec.(in office) (91478)  Patient Instructions: 1)  Please schedule a follow-up appointment in 4.5 to 5  months. 2)  Lipid Panel prior to visit, ICD-9:  fasting today Prescriptions: UNIRETIC 15-12.5 MG TABS (MOEXIPRIL-HYDROCHLOROTHIAZIDE) Take 1 tablet by mouth once a day  #30 Each x 5   Entered by:   Adella Hare LPN   Authorized by:   Syliva Overman MD   Signed by:   Adella Hare LPN on 29/56/2130   Method used:   Electronically to        [facility] [address] [facility] (retail)       [address]       [place]  [place], [place]  [address]       Ph: [phone]       Fax: [phone]   RxID:   [id number]    Orders Added: 1)  Est. Patient 40-64 years [99396] 2)  Ophthalmology Referral [Ophthalmology] 3)  T-Lipid Profile [[id number]] 4)  Pap Smear [88150] 5)  Hemoccult Guaiac-1 spec.(in office) [82270]    Laboratory Results  Date/Time Received: June 17, 2010 11:18 AM  Date/Time Reported: June 17, 2010 11:18 AM   Stool - Occult Blood Hemmoccult #1: negative Date: 06/17/2010 Comments:  50201 10L 02/13 118 10/12 Adella Hare LPN  June 17, 2010 11:19 AM

## 2010-08-16 NOTE — Letter (Signed)
Summary: Pap Smear, Normal Letter, [facility]  [address]   [place], [place] [address]   Phone: [phone]  Fax: [phone]          June 22, 2010    Dear: Shirley Bush    I am pleased to notify you that your PAP smear was normal.  You will need your next PAP smear in:     ____ 3 Months    ____ 6 Months    ____ 12 Months    Please call the office at our office number above, to schedule your next appointment.    Sincerely,     [facility]

## 2010-08-16 NOTE — Letter (Signed)
Summary: Out of Work  [facility]  [address]   [place], [place] [address]   Phone: [phone]  Fax: [phone]    November 08, 2009   Employee:  Shirley Bush    To Whom It May Concern:   For Medical reasons, please excuse the above named employee from work for the following dates:  Start:   11/04/09  End:   11/09/09 to return with no restrictions  If you need additional information, please feel free to contact our office.         Sincerely,    Esperanza Sheets, [place]

## 2010-08-16 NOTE — Letter (Signed)
Summary: Letter  Letter   Imported By: Lind Guest 03/09/2010 11:39:23  _____________________________________________________________________  External Attachment:    Type:   Image     Comment:   External Document

## 2010-08-16 NOTE — Progress Notes (Signed)
Summary: please advise  Phone Note Call from Patient   Summary of Call: patient would like for Dr. Lodema Hong to call her she said she is under a lot of stress right now, crying why she talks to me, states she is on a sleeping medicine and its not helping.  Please advise. [phone] Initial call taken by: Curtis Sites,  February 15, 2010 2:25 PM  Follow-up for Phone Call        needs asn ov tommoprow Follow-up by: Syliva Overman MD,  February 15, 2010 4:39 PM

## 2010-08-16 NOTE — Letter (Signed)
Summary: RHEUMATOLOGY  RHEUMATOLOGY   Imported By: Lind Guest 05/03/2010 14:12:03  _____________________________________________________________________  External Attachment:    Type:   Image     Comment:   External Document

## 2010-08-16 NOTE — Assessment & Plan Note (Signed)
Summary: ov   Vital Signs:  Patient profile:   55 year old female Menstrual status:  postmenopausal Height:      63 inches (160.02 cm) Weight:      150 pounds (68.18 kg) BMI:     26.67 BSA:     1.71 O2 Sat:      98 % Pulse rate:   69 / minute Pulse rhythm:   regular Resp:     16 per minute BP sitting:   150 / 100  (left arm) Cuff size:   large  Vitals Entered By: Everitt Amber (November 13, 2008 10:54 AM)  Nutrition Counseling: Patient's BMI is greater than 25 and therefore counseled on weight management options. CC: Follow up chronic problems, wants to talk about the possibility of fibromyalgia, numbness and tingling in legs and she is concerned Pain Assessment Patient in pain? yes     Location: back and upper left leg Intensity: 8 Type: cramping Onset of pain  6 months ago   CC:  Follow up chronic problems, wants to talk about the possibility of fibromyalgia, and numbness and tingling in legs and she is concerned.  History of Present Illness: Aching al l over , 2 week h/o nion radiating low back pain., intermittent NOT ASSOCIATED WITH LOWER EXTREMITY WEAKNESS OR NUMBNESS. nOT ASSOCIATED WITH INCONTINENCE OF STOOL OR URINE. sHE DENIES ANY RECENT FEVR OR CHILLS. sHE DENIES HEAD OR CHEST CONGESION, BUT HAS INCREASED ALLERGY SYMPTOMS which is expected at this time of the year. She does c/o mild anxiety and feeling strssed, she denies depression and does not wish to resume medication for this at this time. Her sleep is poor.    Preventive Screening-Counseling & Management     Smoking Status: never  Allergies: No Known Drug Allergies  Review of Systems General:  Complains of fatigue and sleep disorder; denies chills and fever. ENT:  Complains of nasal congestion and postnasal drainage; denies sinus pressure and sore throat. CV:  Denies chest pain or discomfort, palpitations, and swelling of feet. Resp:  Denies cough and sputum productive. GI:  Denies abdominal pain,  constipation, diarrhea, nausea, and vomiting. GU:  Denies dysuria and urinary frequency. MS:  Complains of joint pain, low back pain, and mid back pain. Neuro:  Complains of headaches; denies memory loss and seizures. Psych:  Complains of anxiety and easily tearful; denies suicidal thoughts/plans, thoughts of violence, and unusual visions or sounds. Endo:  Denies cold intolerance, excessive hunger, excessive thirst, excessive urination, heat intolerance, polyuria, and weight change.  Physical Exam  General:  alert, well-nourished, and well-hydrated. HEENT: No facial asymmetry,  EOMI, No sinus tenderness, TM's Clear, oropharynx  pink and moist.   Chest: Clear to auscultation bilaterally.  CVS: S1, S2, No murmurs, No S3.   Abd: Soft, Nontender.  MS: DECREASED ROM spine,ADEQUATE hips, shoulders and knees.  Ext: No edema.   CNS: CN 2-12 intact, power tone and sensation normal throughout.   Skin: Intact, no visible lesions or rashes.  Psych: Good eye contact, normal affect.  Memory intact, not anxious or depressed appearing.       Impression & Recommendations:  Problem # 1:  BACK PAIN (ICD-724.5) Assessment Deteriorated  Her updated medication list for this problem includes:    Eq Ibuprofen 200 Mg Caps (Ibuprofen) .Marland Kitchen... Take 2 caps by mouth every 6 hours as needed    Ibuprofen 800 Mg Tabs (Ibuprofen) .Marland Kitchen... Take 1 tablet by mouth two times a day  i  Orders: Depo- Medrol  80mg  (J1040) Ketorolac-Toradol 15mg  (V4098) Admin of Therapeutic Inj  intramuscular or subcutaneous (11914)  Problem # 2:  DEPRESSIVE DISORDER NOT ELSEWHERE CLASSIFIED (ICD-311) Assessment: Deteriorated  Her updated medication list for this problem includes:    Amitriptyline Hcl 10 Mg Tabs (Amitriptyline hcl) .Marland Kitchen... Take 1 tab by mouth at bedtime  Problem # 3:  HYPERTENSION (ICD-401.9) Assessment: Unchanged  The following medications were removed from the medication list:    Maxzide-25 37.5-25 Mg Tabs  (Triamterene-hctz) .Marland Kitchen... Take 1 tablet by mouth once a day    Metoprolol Tartrate 50 Mg Tabs (Metoprolol tartrate) .Marland Kitchen... Take 1 tablet by mouth two times a day Her updated medication list for this problem includes:    Maxzide-25 37.5-25 Mg Tabs (Triamterene-hctz) ..... One and half daily  BP today: 150/100 Prior BP: 140/100 (10/05/2008)  Labs Reviewed: K+: 4.5 (05/01/2008) Creat: : 0.90 (05/01/2008)   Chol: 200 (05/01/2008)   HDL: 57 (05/01/2008)   LDL: 127 (05/01/2008)   TG: 79 (05/01/2008)  Complete Medication List: 1)  Eq Ibuprofen 200 Mg Caps (Ibuprofen) .... Take 2 caps by mouth every 6 hours as needed 2)  Benzaclin 1-5 % Gel (Clindamycin phos-benzoyl perox) .... Apply topically to affected area at bedtime 3)  Maxzide-25 37.5-25 Mg Tabs (Triamterene-hctz) .... One and half daily 4)  Ibuprofen 800 Mg Tabs (Ibuprofen) .... Take 1 tablet by mouth two times a day  i 5)  Amitriptyline Hcl 10 Mg Tabs (Amitriptyline hcl) .... Take 1 tab by mouth at bedtime  Other Orders: Diagnostic X-Ray/Fluoroscopy (Diagnostic X-Ray/Flu)  Patient Instructions: 1)  Please schedule a follow-up appointment in 2 months. 2)  You will get injections for your back, the dose of your BP med is increased to one and a half tablets daily, and you need to start a tablet at night , for help with chronic pain and sleep. Only fill the ibuprofen and prednisone dose pack if the back pain is still bad next week. 3)  X ray of low back today. Prescriptions: AMITRIPTYLINE HCL 10 MG TABS (AMITRIPTYLINE HCL) Take 1 tab by mouth at bedtime  #30 x 2   Entered and Authorized by:   Syliva Overman MD   Signed by:   Syliva Overman MD on 11/13/2008   Method used:   Electronically to        [facility]  [address] (retail)       [address]       [place], [place]  [address]       Ph: [phone]       Fax: [phone]   RxID:   [id number] PREDNISONE (PAK) 5 MG TABS (PREDNISONE) Use as directed  #21 x  0   Entered and Authorized by:   Syliva Overman MD   Signed by:   Syliva Overman MD on 11/13/2008   Method used:   Electronically to        [facility] [address] [facility] (retail)       [address]       [place], [place]  [address]       Ph: [phone]       Fax: [phone]   RxID:   [id number] IBUPROFEN 800 MG TABS (IBUPROFEN) Take 1 tablet by mouth two times a day  i  #20 x 0   Entered and Authorized by:   Syliva Overman MD   Signed by:  Syliva Overman MD on 11/13/2008   Method used:   Electronically to        [facility] (retail)       [address]       [place], [place]  [address]       Ph: [phone]       Fax: [phone]   RxID:   [id number] MAXZIDE-25 37.5-25 MG TABS (TRIAMTERENE-HCTZ) one and half daily  #45 x 4   Entered and Authorized by:   Syliva Overman MD   Signed by:   Syliva Overman MD on 11/13/2008   Method used:   Electronically to        [facility] [address] [facility] (retail)       [address]       [place], [place]  [address]       Ph: [phone]       Fax: [phone]   RxID:   [id number]    Medication Administration  Injection # 1:    Medication: Depo- Medrol 80mg     Diagnosis: BACK PAIN (ICD-724.5)    Route: IM    Site: RUOQ gluteus    Exp Date: 7/12    Lot #: [id number]    Mfr: Pharmacia    Patient tolerated injection without complications    Given by: Worthy Keeler LPN (November 13, 2008 4:40 PM)  Injection # 2:    Medication: Ketorolac-Toradol 15mg     Diagnosis: BACK PAIN (ICD-724.5)    Route: IM    Site: RUOQ gluteus    Exp Date: 08/17/2010    Lot #: [id number]    Mfr: [email or web address]    Comments: toradol 60mg  given    Patient tolerated injection without complications    Given by: Worthy Keeler LPN (November 13, 2008 4:41 PM)  Orders Added: 1)  Est. Patient Level IV [23557] 2)  Diagnostic X-Ray/Fluoroscopy [Diagnostic X-Ray/Flu] 3)  Depo- Medrol 80mg  [J1040] 4)   Ketorolac-Toradol 15mg  [J1885] 5)  Admin of Therapeutic Inj  intramuscular or subcutaneous [32202]

## 2010-11-11 ENCOUNTER — Encounter: Payer: Self-pay | Admitting: Family Medicine

## 2010-11-16 ENCOUNTER — Encounter: Payer: Self-pay | Admitting: Family Medicine

## 2010-11-18 ENCOUNTER — Encounter: Payer: Self-pay | Admitting: Family Medicine

## 2010-11-18 ENCOUNTER — Ambulatory Visit (INDEPENDENT_AMBULATORY_CARE_PROVIDER_SITE_OTHER): Payer: Managed Care, Other (non HMO) | Admitting: Family Medicine

## 2010-11-18 VITALS — BP 140/92 | HR 75 | Resp 16 | Ht 63.0 in | Wt 152.0 lb

## 2010-11-18 DIAGNOSIS — Z79899 Other long term (current) drug therapy: Secondary | ICD-10-CM

## 2010-11-18 DIAGNOSIS — J310 Chronic rhinitis: Secondary | ICD-10-CM

## 2010-11-18 DIAGNOSIS — M542 Cervicalgia: Secondary | ICD-10-CM

## 2010-11-18 DIAGNOSIS — I10 Essential (primary) hypertension: Secondary | ICD-10-CM

## 2010-11-18 MED ORDER — CLONIDINE HCL 0.1 MG PO TABS: ORAL_TABLET | ORAL | Status: DC

## 2010-11-18 MED ORDER — CLINDAMYCIN PHOS-BENZOYL PEROX 1-5 % EX GEL: Freq: Two times a day (BID) | CUTANEOUS | Status: DC

## 2010-11-18 NOTE — Progress Notes (Signed)
  Subjective:    Patient ID: Shirley Bush, female    DOB: Jul 09, 1956, 55 y.o.   MRN: [id number]  HPI Pt is in for re-evaluation of her chronic conditions. She continues to ex[perience significant generalized pain , and is being followed by rheumatology for fibromyalgia.Reports that she is experiencing increased forgetfulness with the xanax which is being prescribed, and is not really comfortable with either that or the flexeril. She does note however that with improved sleep she feels less pain and discomfort. She sleepson average 4 hrs per night. She uses OTC menthol nose spray for her allergies and is happy with this.    Review of Systems Denies recent fever or chills. Denies sinus pressure, nasal congestion, ear pain or sore throat. Denies chest congestion, productive cough or wheezing. Denies chest pains, palpitations, paroxysmal nocturnal dyspnea, orthopnea and leg swelling Denies abdominal pain, nausea, vomiting,diarrhea or constipation.  Denies rectal bleeding or change in bowel movement. Denies dysuria, frequency, hesitancy or incontinence.  Denies headaches, seizure, numbness, or tingling. Denies depression or  anxiety  Denies skin break down or rash.Shje states that her acne is improving with the benzaclin and requests a refill        Objective:   Physical Exam Patient alert and oriented and in no Cardiopulmonary distress.  HEENT: No facial asymmetry, EOMI, no sinus tenderness, TM's clear, Oropharynx pink and moist.  Neck supple no adenopathy.  Chest: Clear to auscultation bilaterally.  CVS: S1, S2 no murmurs, no S3.  ABD: Soft non tender. Bowel sounds normal.  Ext: No edema  MS: Adequate ROM spine, shoulders, hips and knees.  Skin: Intact, no ulcerations or rash noted. Mild to moderate facial acne, controlled  Psych: Good eye contact, normal affect. Memory intact not anxious or depressed appearing.  CNS: CN 2-12 intact, power, tone and sensation normal  throughout.        Assessment & Plan:

## 2010-11-18 NOTE — Patient Instructions (Signed)
F/u in 2 months.   New additional medication sent in for hypertension.take 1 at bedtime   Fasting lipid  And chem 7 in 2 months.  pls follow a low fat diet, and eat a lot of fruit and vegetables  It is important that you exercise regularly at least 30 minutes 5 times a week. If you develop chest pain, have severe difficulty breathing, or feel very tired, stop exercising immediately and seek medical attention    A healthy diet is rich in fruit, vegetables and whole grains. Poultry fish, nuts and beans are a healthy choice for protein rather then red meat. A low sodium diet and drinking 64 ounces of water daily is generally recommended. Oils and sweet should be limited. Carbohydrates especially for those who are diabetic or overweight, should be limited to 34-45 gram per meal. It is important to eat on a regular schedule, at least 3 times daily. Snacks should be primarily fruits, vegetables or nuts.

## 2010-11-18 NOTE — Assessment & Plan Note (Addendum)
Uncontrolled. Medication compliance addressed. Commitment to regular exercise and healthy  food choices, with portion control discussed. DASH diet and low fat diet discussed and literature offered. Changes in medication made at this visit.Clonidine added at bedtime. S/E of sedation and dry mouth discussed

## 2010-11-18 NOTE — Assessment & Plan Note (Signed)
Slightly improved, management per rheumatology.

## 2010-11-18 NOTE — Assessment & Plan Note (Signed)
Unchanged, pt will continue with OTC menthol by choice

## 2010-11-28 ENCOUNTER — Other Ambulatory Visit: Payer: Self-pay

## 2010-11-28 ENCOUNTER — Telehealth: Payer: Self-pay | Admitting: Family Medicine

## 2010-11-28 MED ORDER — CLINDAMYCIN PHOS-BENZOYL PEROX 1-5 % EX GEL: Freq: Two times a day (BID) | CUTANEOUS | Status: DC

## 2010-11-28 MED ORDER — CLINDAMYCIN PHOS-BENZOYL PEROX 1-5 % EX GEL: CUTANEOUS | Status: DC

## 2010-11-28 NOTE — Telephone Encounter (Signed)
Resent her face cream to [facility] because the other pharmacy didn't have the generic available

## 2010-11-29 NOTE — Assessment & Plan Note (Signed)
[facility]                       [facility] OFFICE NOTE   NAME:Bush, Shirley HEGGS                     MRN:          [id number]  DATE:04/12/2007                            DOB:          08-14-1955    IDENTIFICATION:  Shirley Bush is a 55 year old woman who is referred by Dr.  Lodema Hong for evaluation of chest pain.   HISTORY OF PRESENT ILLNESS:  The patient has no known history of  coronary artery disease.  She said over the about the past month she has  had two episodes of left chest pain under her left breast (just left of  the sternum).  The episodes have both last lasted a few seconds. One was  accompanied by shortness of breath, she had to sit down.  They are  sharp, non pleuritic, no change with motion though they are short lived.   Otherwise she is active at work, she works as a Nature conservation officer, is walking and  shelving things; she notes no problems with this, no change in her  ability to do it, no pain with this.   She denies reflux. She denies palpitations.   CURRENT MEDICATIONS:  1. Fexofenadine 180.  2. Triamterene hydrochlorothiazide 37.5/25 (just started Friday as she      could not afford Avalide.  3. Bupropion 100 b.i.d.  4. Mucus relief p.r.n.  5. Ibuprofen p.r.n.  6. Aspirin p.r.n.   PAST MEDICAL HISTORY:  Hypertension.  Allergies.   SOCIAL HISTORY:  Rotator cuff repair; tubal ligation.   SOCIAL HISTORY:  Does not smoke. Does not drink.   FAMILY HISTORY:  Negative for premature CAD.  One sister with  hypertension.   REVIEW OF SYSTEMS:  All systems reviewed, negative to the above problem  except as noted above.   PHYSICAL EXAMINATION:  GENERAL:  On exam the patient is in no distress.  Denies chest pain.  VITAL SIGNS:  Blood pressure is 140/90 left arm, pulse is 77, weight is  148.  HEENT:  Normocephalic, atraumatic.  EOMI.  PRL.  Mucous membranes moist.  NECK:  Normal JVP, no bruits, no thyromegaly.  LUNGS:  Clear without  rales or wheezes.  CARDIAC EXAM:  Regular rate and rhythm, grade 1/6 systolic murmur at the  left sternal border that disappears with standing. No S3 or S4.  ABDOMEN:  Supple, nontender, no masses, no hepatomegaly.  EXTREMITIES:  Good distal pulses, no lower extremity edema.   A 12-lead EKG shows normal sinus rhythm 77 beats per minute, nonspecific  ST, T wave changes.   IMPRESSION:  The patient is a 55 year old with hypertension, no family  history, no use of tobacco, no diabetes. Cholesterol - her LDL is about  120 by her report, I do not have the record.   She has had a couple of episodes of chest pain that were short lived, I  am not convinced they are representative of cardiac ischemia.  She is  active otherwise though not in an organized exercise program.  Has not  had any problems with her work load though.   What I have recommended for now - she can  continue on her medicine.  She  will need close followup with the blood pressure.  I have given her a  prescription for nitroglycerin to have as security if needed.  I do not  think she needs to start on aspirin daily.  I would like to see her back  in about  6 weeks to see how she has done.  If her symptoms worsen or change  again, she can call sooner but I do not think scheduling her for a test  is the right thing right now since the symptoms are atypical, I think  our chance of a false/positive are higher.  She seems comfortable with  this. We talked about dietary modification.  I gave her the web site for  the American Heart Association.     Pricilla Riffle, MD, [facility]  Electronically Signed    PVR/MedQ  DD: 04/12/2007  DT: 04/12/2007  Job #: [id number]

## 2010-11-29 NOTE — Op Note (Signed)
NAME:  Shirley, Bush              ACCOUNT NO.:  [id number]   MEDICAL RECORD NO.:  [id number]          PATIENT TYPE:  AMB   LOCATION:  DAY                           FACILITY:  [facility]   PHYSICIAN:  Kassie Mends, M.D.      DATE OF BIRTH:  1956/06/13   DATE OF PROCEDURE:  02/05/2009  DATE OF DISCHARGE:  02/05/2009                               OPERATIVE REPORT   REFERRING PHYSICIAN:  Milus Mallick. Lodema Hong, MD   PROCEDURE:  Colonoscopy.   INDICATION FOR EXAM:  [name] is a 55 year old female who has a first-  degree relative with colon cancer at age 99.  The last colonoscopy was 5  years ago.   FINDINGS:  1. Frequent sigmoid colon diverticula.  Otherwise, no polyps, masses,      inflammatory changes, or AVMs.  2. Normal retroflex view of the rectum.   RECOMMENDATIONS:  1. Screening colonoscopy in 5 years.  2. She should follow a high-fiber diet.  She was given a handout on      high-fiber diet and diverticulosis.   MEDICATIONS:  1. Demerol.  2. Versed.   PROCEDURE TECHNIQUE:  Physical exam was performed.  Informed consent was  obtained from the patient explaining the benefits, risks, and  alternatives to the procedure.  The patient was connected to the monitor  and placed in left lateral position.  Continuous oxygen was provided by  nasal cannula and IV medicine administered through an indwelling  cannula.  After administration of sedation and rectal exam, the  patient's rectum was intubated  and the scope was advanced under direct visualization to the cecum.  The  scope was removed slowly by carefully examining the color, texture,  anatomy integrity of mucosa on the way out.  The patient was recovered  in endoscopy and discharged to home in satisfactory condition.      Kassie Mends, M.D.  Electronically Signed     SM/MEDQ  D:  02/05/2009  T:  02/06/2009  Job:  [id number]   cc:   Milus Mallick. Lodema Hong, M.D.  Fax: [phone]

## 2010-12-02 NOTE — H&P (Signed)
NAME:  Bush, Shirley              ACCOUNT NO.:  [id number]   MEDICAL RECORD NO.:  [id number]          PATIENT TYPE:  AMB   LOCATION:  DAY                           FACILITY:  [facility]   PHYSICIAN:  Vickki Hearing, M.D.DATE OF BIRTH:  1955-10-12   DATE OF ADMISSION:  DATE OF DISCHARGE:  LH                                HISTORY & PHYSICAL   CHIEF COMPLAINT:  Pain, left shoulder.   HISTORY:  Shirley Bush is a 55 year old female who has failed conservative  management of left shoulder pain.  She has a small to moderate size left  rotator cuff tear and impingement syndrome and was treated non-operatively  with multiple injections and several weeks of physical therapy as well as  oral analgesics.  Her MRI does indicate that she has a small rotator cuff  tear of the supraspinatus tendon.  It is without atrophy and should respond  well to surgical repair.   REVIEW OF SYSTEMS:  Positive findings:  Migraine headaches and sinusitis  problems; otherwise the other eight systems reviewed were normal.   PAST MEDICAL HISTORY:  1.  Depression.  2.  Hypertension.   PAST SURGICAL HISTORY:  1.  Cesarean section on three occasions.  2.  She also had surgery on her right long finger for fracture.   MEDICATIONS:  Accupril, Zoloft, Allegra, penicillin.   FAMILY HISTORY:  Hypertension, colon cancer.   SOCIAL HISTORY:  She is single.  She does not smoke or drink.  Uses caffeine  on occasional basis and has completed her education through the 12th grade.  Her job requires frequent overhead activity and is contributing to her  disease process.   PHYSICAL EXAMINATION:  GENERAL:  She is well-developed and well-nourished.  Grooming and hygiene are normal.  She has no deformities.  Her body habitus  would be described as thin.  CARDIOVASCULAR:  No swelling or varicose veins.  Pulses were normal.  Temperature of her extremities was normal and there was no peripheral edema  or tenderness.  LYMPH NODES:   Her cervical lymph nodes were benign.  CHEST:  Clear.  HEART:  Rate and rhythm normal.  ABDOMEN:  Soft.  SKIN:  Warm, dry and intact with no rash, lesions, ulcer or cafe-au-lait  spots.  NEUROPSYCHOLOGICAL:  Normal neurologic exam including sensation and  reflexes.  She was alert and oriented x3.  Her mood was flat.  EXTREMITIES:  Her left shoulder exam showed decreased range of motion and  flexion, internal rotation and external rotation.  Straight abduction was  also limited.  However, passively her range of motions were near full.  She  did have a positive impingement sign.  She has some weakness but compensated  weakness in her rotator cuff.   DIAGNOSIS:  Rotator cuff tear with impingement syndrome.  Recommend  arthroscopy, left shoulder, with mini open rotator cuff repair.      SEH/MEDQ  D:  11/24/2004  T:  11/25/2004  Job:  [id number]

## 2010-12-02 NOTE — Op Note (Signed)
NAME:  Shirley Bush, Shirley Bush              ACCOUNT NO.:  [id number]   MEDICAL RECORD NO.:  [id number]          PATIENT TYPE:  AMB   LOCATION:  DAY                           FACILITY:  [facility]   PHYSICIAN:  Vickki Hearing, M.D.DATE OF BIRTH:  [birth year]   DATE OF PROCEDURE:  11/25/2004  DATE OF DISCHARGE:                                 OPERATIVE REPORT   CHIEF COMPLAINT:  Pain, left shoulder.   INDICATIONS FOR PROCEDURE:  Shirley Bush is 56 years old.  She has a left  rotator cuff tear documented by MRI.  She failed conservative treatment and  presented for repair.   PREOPERATIVE DIAGNOSIS:  Left rotator cuff tear.   POSTOPERATIVE DIAGNOSIS:  Left rotator cuff tear.   PROCEDURE:  1.  Arthroscopy left shoulder.  2.  Mini open rotator cuff repair.   SURGEON:  Dr. Romeo Apple.   ASSISTANT:  Victoriano Lain.   SPECIMENS:  None.   ANESTHESIA:  General.   ESTIMATED BLOOD LOSS:  Minimal blood loss.   COMPLICATIONS:  No complications.  The patient went to PACU good condition.   DETAILS OF PROCEDURE:  This patient was seen in the preop holding area,  identified as Shirley Bush.  Arm band was checked.  The patient confirmed  her identity; chart was updated.  She marked her left shoulder as the  surgical site, was countersigned by the surgeon.  She was given 1 g of  Ancef.  She was taken to the operating room for general anesthesia.  She was  placed in a Schlein shoulder positioner.  After successful anesthesia and  positioning, her left upper extremity was prepped and draped using sterile  technique.  This included the shoulder.  A time-out was taken.  The  procedure was confirmed as an arthroscopy left shoulder with mini open  rotator cuff repair on Shirley Bush.  She had gotten her antibiotics;  equipment necessary for procedure was in the room.   A posterior approach were used to enter the shoulder.  Glenohumeral  arthroscopy was performed.  Anterior portal was established, and  some  fraying of the humeral joint was debrided.  This was minor.   Re-placed the scope in the subacromial space.  She had a thickened bursal  curtain with signs of inflammation.  This was resected.  An acromioplasty  was performed.   We then identified the rotator cuff tear, supraspinatus, nonretracted 1.5 cm  tear.  We marked it with a spinal needle and then opened through an anterior  deltoid splitting approach.   Through the skin incision made over the acromion at the anterolateral  corner, subcutaneous tissue was divided; deltoid was split line with its  fibers but not detached.  Rotator cuff tear was identified, and a suture  anchor 6.5 mm with two Fibrewire sutures was placed in the humeral head and  then into the cuff.  Horizontal mattress sutures were applied.  This  repaired the cuff to a watertight seal.  The shoulder was taken through a  range of motion; cuff tear was in good condition with no tension.  This was  with  the arm at the side.  We then injected about 20 mL of Marcaine deep in  the subacromial space and soft tissues, closed the deltoid with #5 Ethibond  suture.  We placed a subcu pain pump catheter, injected the remaining 10 mL  of Marcaine into the subcu tissue.  We closed the portal sites with staples  and Steri-Strips, used 2-0 Monocryl to close the subcu, and placed staples  and Steri-Strips in the skin.  We put on a Cryocuff immobilizer, extubated  her, and took her to the recovery room in stable condition.   POSTOPERATIVE PLAN:  Routine rotator cuff protocol, immediate range of  motion to tolerance, and advance as tolerated for 6 weeks, then progressive  resistance exercises from 6-12 weeks and beyond.      SEH/MEDQ  D:  11/25/2004  T:  11/25/2004  Job:  [id number]

## 2011-01-25 ENCOUNTER — Other Ambulatory Visit: Payer: Self-pay | Admitting: Family Medicine

## 2011-01-25 DIAGNOSIS — Z139 Encounter for screening, unspecified: Secondary | ICD-10-CM

## 2011-02-04 LAB — LIPID PANEL
Cholesterol: 177 mg/dL (ref 0–200)
HDL: 59 mg/dL (ref >39)
Total CHOL/HDL Ratio: 3 Ratio
Triglycerides: 87 mg/dL (ref <150)
VLDL: 17 mg/dL (ref 0–40)

## 2011-02-04 LAB — BASIC METABOLIC PANEL
Calcium: 9.8 mg/dL (ref 8.4–10.5)
Potassium: 4.1 mEq/L (ref 3.5–5.3)
Sodium: 141 mEq/L (ref 135–145)

## 2011-02-08 ENCOUNTER — Encounter: Payer: Self-pay | Admitting: Family Medicine

## 2011-02-10 ENCOUNTER — Encounter: Payer: Self-pay | Admitting: Family Medicine

## 2011-02-10 ENCOUNTER — Ambulatory Visit (INDEPENDENT_AMBULATORY_CARE_PROVIDER_SITE_OTHER): Payer: Managed Care, Other (non HMO) | Admitting: Family Medicine

## 2011-02-10 VITALS — BP 150/84 | HR 70 | Resp 16 | Ht 63.0 in | Wt 146.1 lb

## 2011-02-10 DIAGNOSIS — F329 Major depressive disorder, single episode, unspecified: Secondary | ICD-10-CM

## 2011-02-10 DIAGNOSIS — E785 Hyperlipidemia, unspecified: Secondary | ICD-10-CM

## 2011-02-10 DIAGNOSIS — IMO0001 Reserved for inherently not codable concepts without codable children: Secondary | ICD-10-CM

## 2011-02-10 DIAGNOSIS — L708 Other acne: Secondary | ICD-10-CM

## 2011-02-10 DIAGNOSIS — M797 Fibromyalgia: Secondary | ICD-10-CM

## 2011-02-10 DIAGNOSIS — Z23 Encounter for immunization: Secondary | ICD-10-CM

## 2011-02-10 DIAGNOSIS — L709 Acne, unspecified: Secondary | ICD-10-CM

## 2011-02-10 DIAGNOSIS — I10 Essential (primary) hypertension: Secondary | ICD-10-CM

## 2011-02-10 NOTE — Patient Instructions (Addendum)
CPE December 3 or after.  You absolutely need to take the clonidine 0.1 mg at bedtime, this will help your blood pressure, hot flashes, and sleep .  Please make appt with dr Kellie Simmering and discuss your nails with him.  TdAP today  Use any OTC acne prep on your face with a benzoyl peroxide base at bedtime

## 2011-02-10 NOTE — Progress Notes (Signed)
  Subjective:    Patient ID: Shirley Bush, female    DOB: 1955/10/20, 55 y.o.   MRN: [id number]  HPI Generalized joint pains , and muscular pains persist and bad cramps, will call for f/u with rheumatologist, cold espescilly worsens her symptoms.  She has noted ridging of the nails and discoloration progressive over the past 2 years, they do in fact look "abnormal' i will send a note to rheumatologist to request his input, dermatology may need to be involved. The PT is here for follow up and re-evaluation of chronic medical conditions, medication management and review of any available recent lab and radiology data.  Preventive health is updated, specifically  Cancer screening and Immunization.   Questions or concerns regarding consultations or procedures which the PT has had in the interim are  addressed. The PT denies any adverse reactions to current medications since the last visit. She is non compliant wilt clonidine, has stopped xanax when she found out it may be associated with memory loss, and c/o uncontrolled hot flashes and poor sleep      Review of Systems Denies recent fever or chills. Denies sinus pressure, nasal congestion, ear pain or sore throat. Denies chest congestion, productive cough or wheezing. Denies chest pains, palpitations and leg swelling Denies abdominal pain, nausea, vomiting,diarrhea or constipation.   Denies dysuria, frequency, hesitancy or incontinence. Denies headaches, seizures, numbness, or tingling. Increased and anxiety and insomnia, mild depression due to challenges with her children. Denies skin break down or rash.        Objective:   Physical Exam Patient alert and oriented and in no cardiopulmonary distress.  HEENT: No facial asymmetry, EOMI, no sinus tenderness,  oropharynx pink and moist.  Neck supple no adenopathy.  Chest: Clear to auscultation bilaterally.  CVS: S1, S2 no murmurs, no S3.  ABD: Soft non tender. Bowel sounds  normal.  Ext: No edema  MS: Adequate ROM spine, shoulders, hips and knees.  Skin: Intact, no ulcerations or rash noted.  Psych: Good eye contact, normal affect. Memory intact not anxious or depressed appearing.  CNS: CN 2-12 intact, power, tone and sensation normal throughout.        Assessment & Plan:   No problem-specific assessment & plan notes found for this encounter.

## 2011-02-11 DIAGNOSIS — E785 Hyperlipidemia, unspecified: Secondary | ICD-10-CM | POA: Insufficient documentation

## 2011-02-11 NOTE — Assessment & Plan Note (Signed)
Medication compliance addressed. Commitment to regular exercise and healthy  food choices, with portion control discussed. DASH diet and low fat diet discussed and literature offered. Changes in medication made at this visit.  

## 2011-02-11 NOTE — Assessment & Plan Note (Signed)
Overall markedly improved, a lot of stress due to her children , but dealing well overall

## 2011-02-11 NOTE — Assessment & Plan Note (Signed)
Improved with dietary change only

## 2011-02-11 NOTE — Assessment & Plan Note (Signed)
Uncontrolled sympotms, non compliant with treatment plan by the rheumatologist, will be calling for f/u , also to discuss nail abnormalities

## 2011-02-13 ENCOUNTER — Telehealth: Payer: Self-pay | Admitting: Family Medicine

## 2011-02-14 NOTE — Telephone Encounter (Signed)
Called patient, no answer 

## 2011-02-15 NOTE — Telephone Encounter (Signed)
Called patient, left message.

## 2011-02-15 NOTE — Telephone Encounter (Signed)
unlikey to be due to TDAP if no redness over the area she got the injection pls let her know

## 2011-02-15 NOTE — Telephone Encounter (Signed)
Patient aware.

## 2011-02-15 NOTE — Telephone Encounter (Signed)
Patient states that after tdap injections she had three knots come up on arm pit of that arm, were swollen, and sore, states they have gone down now. Could this have been an allergic reaction or unrelated?

## 2011-02-24 ENCOUNTER — Ambulatory Visit (HOSPITAL_COMMUNITY): Admission: RE | Admit: 2011-02-24 | Payer: Managed Care, Other (non HMO) | Source: Ambulatory Visit

## 2011-03-14 ENCOUNTER — Other Ambulatory Visit: Payer: Self-pay | Admitting: Family Medicine

## 2011-03-14 DIAGNOSIS — Z139 Encounter for screening, unspecified: Secondary | ICD-10-CM

## 2011-03-17 ENCOUNTER — Ambulatory Visit (HOSPITAL_COMMUNITY): Admission: RE | Admit: 2011-03-17 | Payer: Managed Care, Other (non HMO) | Source: Ambulatory Visit

## 2011-03-21 ENCOUNTER — Ambulatory Visit (HOSPITAL_COMMUNITY): Payer: Managed Care, Other (non HMO)

## 2011-05-12 ENCOUNTER — Ambulatory Visit (HOSPITAL_COMMUNITY)
Admission: RE | Admit: 2011-05-12 | Discharge: 2011-05-12 | Disposition: A | Discharge status: Home / Self Care | Payer: Managed Care, Other (non HMO) | Source: Ambulatory Visit | Attending: Family Medicine | Admitting: Family Medicine

## 2011-05-12 DIAGNOSIS — Z139 Encounter for screening, unspecified: Secondary | ICD-10-CM

## 2011-05-12 DIAGNOSIS — Z1231 Encounter for screening mammogram for malignant neoplasm of breast: Secondary | ICD-10-CM | POA: Insufficient documentation

## 2011-06-16 ENCOUNTER — Other Ambulatory Visit: Payer: Self-pay

## 2011-06-16 MED ORDER — CLINDAMYCIN PHOS-BENZOYL PEROX 1-5 % EX GEL: CUTANEOUS | Status: DC

## 2011-06-20 ENCOUNTER — Other Ambulatory Visit: Payer: Self-pay

## 2011-06-20 MED ORDER — CLINDAMYCIN PHOS-BENZOYL PEROX 1-5 % EX GEL: CUTANEOUS | Status: DC

## 2011-06-21 ENCOUNTER — Encounter: Payer: Self-pay | Admitting: Family Medicine

## 2011-06-23 ENCOUNTER — Telehealth: Payer: Self-pay | Admitting: Family Medicine

## 2011-06-23 ENCOUNTER — Encounter: Payer: Managed Care, Other (non HMO) | Admitting: Family Medicine

## 2011-06-26 ENCOUNTER — Emergency Department (HOSPITAL_COMMUNITY): Payer: Managed Care, Other (non HMO)

## 2011-06-26 ENCOUNTER — Telehealth: Payer: Self-pay | Admitting: Family Medicine

## 2011-06-26 ENCOUNTER — Encounter (HOSPITAL_COMMUNITY): Payer: Self-pay | Admitting: Emergency Medicine

## 2011-06-26 ENCOUNTER — Emergency Department (HOSPITAL_COMMUNITY)
Admission: EM | Admit: 2011-06-26 | Discharge: 2011-06-26 | Disposition: A | Discharge status: Home / Self Care | Payer: Managed Care, Other (non HMO) | Attending: Emergency Medicine | Admitting: Emergency Medicine

## 2011-06-26 DIAGNOSIS — I10 Essential (primary) hypertension: Secondary | ICD-10-CM | POA: Insufficient documentation

## 2011-06-26 DIAGNOSIS — J111 Influenza due to unidentified influenza virus with other respiratory manifestations: Secondary | ICD-10-CM | POA: Insufficient documentation

## 2011-06-26 DIAGNOSIS — IMO0001 Reserved for inherently not codable concepts without codable children: Secondary | ICD-10-CM | POA: Insufficient documentation

## 2011-06-26 DIAGNOSIS — J40 Bronchitis, not specified as acute or chronic: Secondary | ICD-10-CM | POA: Insufficient documentation

## 2011-06-26 HISTORY — DX: Anxiety disorder, unspecified: F41.9

## 2011-06-26 MED ORDER — OSELTAMIVIR PHOSPHATE 75 MG PO CAPS: 75.0000 mg | ORAL_CAPSULE | Freq: Two times a day (BID) | ORAL | Status: AC

## 2011-06-26 MED ORDER — AMOXICILLIN 500 MG PO CAPS: 500.0000 mg | ORAL_CAPSULE | Freq: Three times a day (TID) | ORAL | Status: AC

## 2011-06-26 NOTE — Telephone Encounter (Signed)
Noted and agreed.

## 2011-06-26 NOTE — Telephone Encounter (Signed)
Patient called back again and was advised to go to the ER. See previous message

## 2011-06-26 NOTE — ED Provider Notes (Signed)
History   This chart was scribed for Shirley Lennert, MD by Sofie Rower. The patient was seen in room APA10/APA10 and the patient's care was started at 10:15AM.    CSN: [id number] Arrival date & time: 06/26/2011  9:41 AM   First MD Initiated Contact with Patient 06/26/11 0950      Chief Complaint  Patient presents with  . Cough  . Generalized Body Aches  . Otalgia  . Sore Throat    (Consider location/radiation/quality/duration/timing/severity/associated sxs/prior treatment) HPI  Shirley Bush is a 55 y.o. female who presents to the Emergency Department complaining of moderate, constant cough onset Saturday. The patient rates the pain 8/10 at worst. Associated symptoms include sore throat, ear pain, and myalgia. Pt. Denies vomiting, diarrhea. The patient reports that she has a productive cough, generating yellow sputum. Pt. Claims she has had her flu shot in September of this year. Patients PCP is Dr. Lodema Hong.  Past Medical History  Diagnosis Date  . Otitis media   . Hypertension   . FHx: migraine headaches   . Fibromyalgia syndrome 2011  . Anxiety     Past Surgical History  Procedure Date  . Tubal ligation 1992  . Cesarean section 1978  . Cesarean section 1980  . Cesarean section 1992  . Umbilical hernia repair 2008  . Left shoulder arthroscopy 2006  . Right shoulder 2007  . Left shoulder arthroscopy 2009    Family History  Problem Relation Age of Onset  . Hypertension Mother   . Hypertension Father   . Alcohol abuse Father   . Seizures Father   . Migraines Sister   . Colon cancer Sister   . Thyroid disease Sister   . Diabetes Sister   . Cancer Sister     History  Substance Use Topics  . Smoking status: Never Smoker   . Smokeless tobacco: Never Used  . Alcohol Use: No    OB History    Grav Para Term Preterm Abortions TAB SAB Ect Mult Living   4 3 3  1     3       Review of Systems  10 Systems reviewed and are negative for acute change except as  noted in the HPI.   Allergies  Review of patient's allergies indicates no known allergies.  Home Medications   Current Outpatient Rx  Name Route Sig Dispense Refill  . CLONIDINE HCL 0.1 MG PO TABS  One at bedtime 30 tablet 11  . MOEXIPRIL-HYDROCHLOROTHIAZIDE 15-12.5 MG PO TABS Oral Take by mouth daily.      Marland Kitchen HAIR/SKIN/NAILS PO Oral Take by mouth. 3 tabs daily     . AMOXICILLIN 500 MG PO CAPS Oral Take 1 capsule (500 mg total) by mouth 3 (three) times daily. 21 capsule 0  . CALCIUM-VITAMIN D 250-125 MG-UNIT PO TABS Oral Take 1 tablet by mouth daily.     . OSELTAMIVIR PHOSPHATE 75 MG PO CAPS Oral Take 1 capsule (75 mg total) by mouth every 12 (twelve) hours. 10 capsule 0    BP 158/32  Pulse 76  Temp(Src) 99 F (37.2 C) (Oral)  Resp 18  Ht 5\' 3"  (1.6 m)  Wt 142 lb (64.411 kg)  BMI 25.15 kg/m2  SpO2 100%  Physical Exam  Nursing note and vitals reviewed. Constitutional: She is oriented to person, place, and time. She appears well-developed and well-nourished.  HENT:  Head: Normocephalic and atraumatic.  Eyes: Conjunctivae and EOM are normal. No scleral icterus.  Neck: Neck supple.  No thyromegaly present.  Cardiovascular: Normal rate and regular rhythm.  Exam reveals no gallop and no friction rub.   No murmur heard. Pulmonary/Chest: Effort normal. No stridor. She has no wheezes. She has no rales. She exhibits no tenderness.  Abdominal: She exhibits no distension. There is no tenderness. There is no rebound.  Musculoskeletal: Normal range of motion. She exhibits no edema.  Lymphadenopathy:    She has no cervical adenopathy.  Neurological: She is alert and oriented to person, place, and time. Coordination normal.  Skin: Skin is warm and dry. No rash noted. No erythema.  Psychiatric: She has a normal mood and affect. Her behavior is normal.    ED Course  Procedures (including critical care time)  DIAGNOSTIC STUDIES: Oxygen Saturation is 100% on room air, normal by my  interpretation.    COORDINATION OF CARE:  10:17AM-EDP at bedside discusses patient treatment plan. 12:53PM- Recheck. EDP at bedside discusses continued treatment plan.   Results for orders placed in visit on 11/18/10  BASIC METABOLIC PANEL      Component Value Range   Sodium 141  135 - 145 (mEq/L)   Potassium 4.1  3.5 - 5.3 (mEq/L)   Chloride 106  96 - 112 (mEq/L)   CO2 25  19 - 32 (mEq/L)   Glucose, Bld 84  70 - 99 (mg/dL)   BUN 11  6 - 23 (mg/dL)   Creat 4.09  8.11 - 9.14 (mg/dL)   Calcium 9.8  8.4 - 78.2 (mg/dL)  LIPID PANEL      Component Value Range   Cholesterol 177  0 - 200 (mg/dL)   Triglycerides 87  <956 (mg/dL)   HDL 59  >21 (mg/dL)   Total CHOL/HDL Ratio 3.0     VLDL 17  0 - 40 (mg/dL)   LDL Cholesterol 308 (*) 0 - 99 (mg/dL)   Dg Chest 2 View  65/78/4696  *RADIOLOGY REPORT*  Clinical Data: Productive cough for 2 days, congestion, hypertension  CHEST - 2 VIEW  Comparison: 03/30/2004  Findings: Biconvex thoracolumbar scoliosis. Normal heart size, mediastinal contours, and pulmonary vascularity. Lungs appear minimally hyperinflated but clear. No acute infiltrate, pleural effusion, or pneumothorax. No acute osseous findings.  IMPRESSION: Minimally hyperinflated lungs without acute infiltrate. Scoliosis.  Original Report Authenticated By: Lollie Marrow, M.D.                MDM   The chart was scribed for me under my direct supervision.  I personally performed the history, physical, and medical decision making and all procedures in the evaluation of this patient.Shirley Lennert, MD 06/26/11 [phone]

## 2011-06-26 NOTE — ED Notes (Signed)
Patient c/o cough, sore throat, left ear pain, and generalized body aches. Per patient productive cough-yellowish green sputum.

## 2011-07-22 ENCOUNTER — Other Ambulatory Visit: Payer: Self-pay | Admitting: Family Medicine

## 2011-07-24 ENCOUNTER — Telehealth: Payer: Self-pay | Admitting: Family Medicine

## 2011-07-26 ENCOUNTER — Other Ambulatory Visit: Payer: Self-pay

## 2011-07-26 DIAGNOSIS — I10 Essential (primary) hypertension: Secondary | ICD-10-CM

## 2011-07-26 MED ORDER — MOEXIPRIL-HYDROCHLOROTHIAZIDE 15-12.5 MG PO TABS: 1.0000 | ORAL_TABLET | Freq: Every day | ORAL | Status: DC

## 2011-07-26 NOTE — Telephone Encounter (Signed)
Refilled med x 30 day supply.  Pt has appointment on feb. 8th

## 2011-08-25 ENCOUNTER — Other Ambulatory Visit (HOSPITAL_COMMUNITY)
Admission: RE | Admit: 2011-08-25 | Discharge: 2011-08-25 | Disposition: A | Discharge status: Home / Self Care | Payer: Managed Care, Other (non HMO) | Source: Ambulatory Visit | Attending: Family Medicine | Admitting: Family Medicine

## 2011-08-25 ENCOUNTER — Ambulatory Visit (INDEPENDENT_AMBULATORY_CARE_PROVIDER_SITE_OTHER): Payer: Managed Care, Other (non HMO) | Admitting: Family Medicine

## 2011-08-25 ENCOUNTER — Encounter: Payer: Self-pay | Admitting: Family Medicine

## 2011-08-25 VITALS — BP 150/90 | HR 86 | Resp 18 | Ht 63.0 in | Wt 143.0 lb

## 2011-08-25 DIAGNOSIS — B351 Tinea unguium: Secondary | ICD-10-CM

## 2011-08-25 DIAGNOSIS — IMO0001 Reserved for inherently not codable concepts without codable children: Secondary | ICD-10-CM

## 2011-08-25 DIAGNOSIS — J209 Acute bronchitis, unspecified: Secondary | ICD-10-CM | POA: Insufficient documentation

## 2011-08-25 DIAGNOSIS — I10 Essential (primary) hypertension: Secondary | ICD-10-CM

## 2011-08-25 DIAGNOSIS — Z0000 Encounter for general adult medical examination without abnormal findings: Secondary | ICD-10-CM

## 2011-08-25 DIAGNOSIS — Z01419 Encounter for gynecological examination (general) (routine) without abnormal findings: Secondary | ICD-10-CM | POA: Insufficient documentation

## 2011-08-25 DIAGNOSIS — R5383 Other fatigue: Secondary | ICD-10-CM

## 2011-08-25 DIAGNOSIS — M899 Disorder of bone, unspecified: Secondary | ICD-10-CM

## 2011-08-25 MED ORDER — FLUCONAZOLE 150 MG PO TABS: ORAL_TABLET | ORAL | Status: AC

## 2011-08-25 MED ORDER — BENZONATATE 100 MG PO CAPS: 100.0000 mg | ORAL_CAPSULE | Freq: Four times a day (QID) | ORAL | Status: DC | PRN

## 2011-08-25 MED ORDER — AZITHROMYCIN 250 MG PO TABS: ORAL_TABLET | ORAL | Status: AC

## 2011-08-25 MED ORDER — TERBINAFINE HCL 250 MG PO TABS: 250.0000 mg | ORAL_TABLET | Freq: Every day | ORAL | Status: DC

## 2011-08-25 NOTE — Assessment & Plan Note (Signed)
Reports persistent joint pains , limiting ability to work at times, requests 2nd opinion from rheumatology, will refer per her request

## 2011-08-25 NOTE — Patient Instructions (Signed)
F/u in 4 month.  Labs today, cbc, tsh, vit D and CMP   You are being treated for bronchitis and also fungal nail infection.  You are refererred to another rheumatologist per your request  It is important that you exercise regularly at least 30 minutes 5 times a week. If you develop chest pain, have severe difficulty breathing, or feel very tired, stop exercising immediately and seek medical attention   A healthy diet is rich in fruit, vegetables and whole grains. Poultry fish, nuts and beans are a healthy choice for protein rather then red meat. A low sodium diet and drinking 64 ounces of water daily is generally recommended. Oils and sweet should be limited. Carbohydrates especially for those who are diabetic or overweight, should be limited to 30-45 gram per meal. It is important to eat on a regular schedule, at least 3 times daily. Snacks should be primarily fruits, vegetables or nuts.

## 2011-08-25 NOTE — Progress Notes (Signed)
  Subjective:    Patient ID: Shirley Bush, female    DOB: 1956/02/22, 56 y.o.   MRN: [id number]  HPI The PT is here for annual exam and re-evaluation of chronic medical conditions, medication management and review of any available recent lab and radiology data.  Preventive health is updated, specifically  Cancer screening and Immunization.   Questions or concerns regarding consultations or procedures which the PT has had in the interim are  Addressed.Requests a new rheumatologist , since she c/o uncontrolled joint pains, esp hips , knees and hands, at times this affects her ability to adequately work. Concerned about her hair and nails also. Uses naproxen or ibuprofen. Had requested work note from Dr. Kellie Simmering who she states told her to quit her job The PT is non compliant with bP med stating at times she feels light headed or has cramps when she takes the recommended dose C/o hair loss and well as thickening and lifting of nails     Review of Systems See HPI C/o recent  Chills for the past week intermittently Denies sinus pressure c/o nasal congestion,. Denies ear pain, has had some sore throat C/o chest congestion with cough productive of yellow sputum x 1 week Denies chest pains, palpitations and leg swelling Denies abdominal pain, nausea, vomiting,diarrhea or constipation.   Denies dysuria, frequency, hesitancy or incontinence.  Denies headaches, seizures, numbness, or tingling. Denies depression, anxiety or insomnia. Denies skin break down or rash.         Objective:   Physical Exam Pleasant well nourished female, alert and oriented x 3, in no cardio-pulmonary distress. Afebrile. HEENT No facial trauma or asymetry. Sinuses non tender.  EOMI, PERTL. External ears normal, tympanic membranes clear. Oropharynx moist, no exudate, good dentition. Neck: supple, no adenopathy,JVD or thyromegaly.No bruits.  Chest: Decreased air entry scattered crackles few wheezes Non  tender to palpation  Breast: No asymetry,no masses. No nipple discharge or inversion. No axillary or supraclavicular adenopathy  Cardiovascular system; Heart sounds normal,  S1 and  S2 ,no S3.  No murmur, or thrill. Apical beat not displaced Peripheral pulses normal.  Abdomen: Soft, non tender, no organomegaly or masses. No bruits. Bowel sounds normal. No guarding, tenderness or rebound.  Rectal:  No mass. Guaiac negative stool.  GU: External genitalia normal. No lesions. Vaginal canal normal.No discharge. Uterus enlarged ,no cervical motion or adnexal tenderness.  Musculoskeletal exam: Full ROM of spine, hips , shoulders and knees. No deformity ,swelling or crepitus noted. No muscle wasting or atrophy.   Neurologic: Cranial nerves 2 to 12 intact. Power, tone ,sensation and reflexes normal throughout. No disturbance in gait. No tremor.  Skin: Intact, no ulceration, erythema , scaling or rash noted. Onychomycosis, thickening and splitting of toennails and fingernaiols Pigmentation normal throughout  Psych; Normal mood and affect. Judgement and concentration normal        Assessment & Plan:

## 2011-08-25 NOTE — Assessment & Plan Note (Signed)
Uncontrolled, reports intolerance of meds, she is advised to take half clonidine dose at bedtiime and one regular tablet during the day

## 2011-08-25 NOTE — Assessment & Plan Note (Signed)
Antibiotic and decongestant prescribed 

## 2011-08-25 NOTE — Assessment & Plan Note (Signed)
Fungal toe and fingernail infection, lamisil prescribed x 3 month

## 2011-08-26 LAB — COMPLETE METABOLIC PANEL WITH GFR
Albumin: 4.8 g/dL (ref 3.5–5.2)
Alkaline Phosphatase: 70 U/L (ref 39–117)
CO2: 23 mEq/L (ref 19–32)
Calcium: 10.1 mg/dL (ref 8.4–10.5)
Chloride: 103 mEq/L (ref 96–112)
GFR, Est African American: >89 mL/min
GFR, Est Non African American: 83 mL/min
Glucose, Bld: 92 mg/dL (ref 70–99)
Potassium: 4.5 mEq/L (ref 3.5–5.3)
Sodium: 138 mEq/L (ref 135–145)
Total Protein: 7.5 g/dL (ref 6.0–8.3)

## 2011-08-26 LAB — CBC WITH DIFFERENTIAL/PLATELET
Basophils Absolute: 0.1 10*3/uL (ref 0.0–0.1)
Basophils Relative: 1 % (ref 0–1)
Eosinophils Absolute: 0.4 10*3/uL (ref 0.0–0.7)
Eosinophils Relative: 5 % (ref 0–5)
HCT: 38.3 % (ref 36.0–46.0)
Hemoglobin: 12.8 g/dL (ref 12.0–15.0)
MCH: 30.3 pg (ref 26.0–34.0)
MCHC: 33.4 g/dL (ref 30.0–36.0)
Monocytes Absolute: 0.8 10*3/uL (ref 0.1–1.0)
Monocytes Relative: 10 % (ref 3–12)
Neutro Abs: 4 10*3/uL (ref 1.7–7.7)
RDW: 12.9 % (ref 11.5–15.5)

## 2011-08-28 ENCOUNTER — Telehealth: Payer: Self-pay | Admitting: Family Medicine

## 2011-08-28 NOTE — Telephone Encounter (Signed)
A z pack was prescribed on 2/8 pls let her know and find out what the probs are I recommend saline flushes  with otc netty pot for nasal congestion and pressure also 2 to 3 times per day

## 2011-08-29 ENCOUNTER — Other Ambulatory Visit: Payer: Self-pay | Admitting: Family Medicine

## 2011-08-29 MED ORDER — PENICILLIN V POTASSIUM 500 MG PO TABS: 500.0000 mg | ORAL_TABLET | Freq: Three times a day (TID) | ORAL | Status: AC

## 2011-08-29 NOTE — Telephone Encounter (Signed)
Spoke with pt and she stated that she completed z pak on today and now she says that she is having sinus pressure and drainage. And that she has been using the saline daily all along.  Says that she would like advice on something else to try in order to avoid staying out of work.

## 2011-08-29 NOTE — Telephone Encounter (Signed)
Left message on pt voicemail notifying her of new rx and that she can call back with any questions.

## 2011-08-29 NOTE — Telephone Encounter (Signed)
I will send in 10 days of penicillin pls let her know

## 2011-10-11 ENCOUNTER — Other Ambulatory Visit: Payer: Self-pay | Admitting: Family Medicine

## 2011-12-22 ENCOUNTER — Ambulatory Visit: Payer: Managed Care, Other (non HMO) | Admitting: Family Medicine

## 2012-01-12 ENCOUNTER — Encounter: Payer: Self-pay | Admitting: Family Medicine

## 2012-01-12 ENCOUNTER — Ambulatory Visit (INDEPENDENT_AMBULATORY_CARE_PROVIDER_SITE_OTHER): Payer: Managed Care, Other (non HMO) | Admitting: Family Medicine

## 2012-01-12 VITALS — BP 180/100 | HR 69 | Resp 18 | Ht 63.0 in | Wt 148.1 lb

## 2012-01-12 DIAGNOSIS — I10 Essential (primary) hypertension: Secondary | ICD-10-CM

## 2012-01-12 DIAGNOSIS — E785 Hyperlipidemia, unspecified: Secondary | ICD-10-CM

## 2012-01-12 DIAGNOSIS — G43909 Migraine, unspecified, not intractable, without status migrainosus: Secondary | ICD-10-CM

## 2012-01-12 MED ORDER — PROMETHAZINE HCL 25 MG PO TABS: ORAL_TABLET | ORAL | Status: DC

## 2012-01-12 MED ORDER — HYDROCODONE-ACETAMINOPHEN 5-500 MG PO TABS: ORAL_TABLET | ORAL | Status: DC

## 2012-01-12 MED ORDER — AMLODIPINE BESYLATE 5 MG PO TABS: 5.0000 mg | ORAL_TABLET | Freq: Every day | ORAL | Status: DC

## 2012-01-12 MED ORDER — METOPROLOL SUCCINATE ER 50 MG PO TB24: 50.0000 mg | ORAL_TABLET | Freq: Every day | ORAL | Status: DC

## 2012-01-12 NOTE — Assessment & Plan Note (Signed)
Uncontrolled, pt to start medication consistently

## 2012-01-12 NOTE — Patient Instructions (Addendum)
F/U in 3 weeks.  Please get appt for new rheumatologist re fibromyalgia today   Your blood pressure is too high, please commit  To taking blood pressure medication daily and return in 3 weeks for re evaluation. Call if problems occur

## 2012-01-12 NOTE — Assessment & Plan Note (Signed)
Hyperlipidemia:Low fat diet discussed and encouraged.   

## 2012-01-12 NOTE — Assessment & Plan Note (Signed)
Uncontrolled, increased frequency, on avg 3 times per month

## 2012-01-12 NOTE — Progress Notes (Signed)
  Subjective:    Patient ID: Shirley Bush, female    DOB: Apr 02, 1956, 56 y.o.   MRN: [id number]  HPI  The PT is here for follow up and re-evaluation of chronic medical conditions, medication management and review of any available recent lab and radiology data.  Preventive health is updated, specifically  Cancer screening and Immunization.   Still awaiting appt with rheumatology re fibromyalgia. States her headaches have increased in frequency, and severity, on average 3 times per month. Non compliant with med for blood pressure, states it causes cramps C/o headache both due to sinuses and sleep deprivation   Review of Systems See HPI Denies recent fever or chills. Denies sinus pressure, nasal congestion, ear pain or sore throat. Denies chest congestion, productive cough or wheezing. Denies chest pains, palpitations and leg swelling Denies abdominal pain, nausea, vomiting,diarrhea or constipation.   Denies dysuria, frequency, hesitancy or incontinence. Denies joint pain, swelling and limitation in mobility. Denies uncontrolled  depression, anxiety or insomnia.Increased stress on the job and with family Denies skin break down or rash.        Objective:   Physical Exam  Patient alert and oriented and in no cardiopulmonary distress.  HEENT: No facial asymmetry, EOMI, no sinus tenderness,  oropharynx pink and moist.  Neck supple no adenopathy.  Chest: Clear to auscultation bilaterally.  CVS: S1, S2 no murmurs, no S3.  ABD: Soft non tender. Bowel sounds normal.  Ext: No edema  MS: Adequate ROM spine, shoulders, hips and knees.  Skin: Intact, no ulcerations or rash noted.  Psych: Good eye contact, normal affect. Memory intact not anxious or depressed appearing.  CNS: CN 2-12 intact, power, tone and sensation normal throughout.       Assessment & Plan:

## 2012-01-15 ENCOUNTER — Telehealth: Payer: Self-pay | Admitting: Family Medicine

## 2012-02-07 NOTE — Telephone Encounter (Signed)
Patient is aware 

## 2012-02-09 ENCOUNTER — Ambulatory Visit: Payer: Managed Care, Other (non HMO) | Admitting: Family Medicine

## 2012-02-16 ENCOUNTER — Ambulatory Visit: Payer: Managed Care, Other (non HMO) | Admitting: Family Medicine

## 2012-03-22 ENCOUNTER — Ambulatory Visit: Payer: Managed Care, Other (non HMO) | Admitting: Family Medicine

## 2012-04-05 ENCOUNTER — Encounter: Payer: Self-pay | Admitting: Family Medicine

## 2012-04-05 ENCOUNTER — Ambulatory Visit (INDEPENDENT_AMBULATORY_CARE_PROVIDER_SITE_OTHER): Payer: Managed Care, Other (non HMO) | Admitting: Family Medicine

## 2012-04-05 VITALS — BP 146/86 | HR 68 | Resp 18 | Ht 63.0 in | Wt 147.1 lb

## 2012-04-05 DIAGNOSIS — IMO0001 Reserved for inherently not codable concepts without codable children: Secondary | ICD-10-CM

## 2012-04-05 DIAGNOSIS — I10 Essential (primary) hypertension: Secondary | ICD-10-CM

## 2012-04-05 DIAGNOSIS — M797 Fibromyalgia: Secondary | ICD-10-CM

## 2012-04-05 DIAGNOSIS — G43909 Migraine, unspecified, not intractable, without status migrainosus: Secondary | ICD-10-CM

## 2012-04-05 DIAGNOSIS — M549 Dorsalgia, unspecified: Secondary | ICD-10-CM

## 2012-04-05 MED ORDER — DIAZEPAM 2 MG PO TABS: ORAL_TABLET | ORAL | Status: DC

## 2012-04-05 MED ORDER — METOPROLOL SUCCINATE ER 100 MG PO TB24: 100.0000 mg | ORAL_TABLET | Freq: Every day | ORAL | Status: DC

## 2012-04-05 NOTE — Assessment & Plan Note (Signed)
Uncontrolled, dose increase on metoprolol

## 2012-04-05 NOTE — Assessment & Plan Note (Signed)
Back, knee and generalized pain increased with spasm, poor sleep due to symptoms, start valium at bedtime

## 2012-04-05 NOTE — Assessment & Plan Note (Signed)
Daily headaches, hydrocodone not as effective as in the past, limitation ion use suggested

## 2012-04-05 NOTE — Assessment & Plan Note (Signed)
persistent uncontrolled symptoms pt to schedule her own appt will give contact info

## 2012-04-05 NOTE — Progress Notes (Signed)
  Subjective:    Patient ID: Shirley Bush, female    DOB: [birth year], 56 y.o.   MRN: [id number]  HPI The PT is here for follow up and re-evaluation of chronic medical conditions, medication management and review of any available recent lab and radiology data.  Preventive health is updated, specifically  Cancer screening and Immunization.   Pt did not keep any appts with rheumatologist in [place], requests  a new appt with one in [place] for 2nd opinion on dx and manage,ment of fibromyalgia, hurts all over, and poor sleep. Cramps, pain meds not particularly helpful, exercise routine not consistent The PT denies any adverse reactions to current medications since the last visit.         Review of Systems See HPI Denies recent fever or chills. Denies sinus pressure, nasal congestion, ear pain or sore throat.Increased allergy symptoms with weather change , but no fevr or chills , will get flu vaccine at her workplace Denies chest congestion, productive cough or wheezing. Denies chest pains, palpitations and leg swelling Denies abdominal pain, nausea, vomiting,diarrhea or constipation.   Denies dysuria, frequency, hesitancy or incontinence.  Denies headaches, seizures, numbness, or tingling. Denies depression, anxiety or insomnia. Denies skin break down or rash.        Objective:   Physical Exam  Patient alert and oriented and in no cardiopulmonary distress.  HEENT: No facial asymmetry, EOMI, no sinus tenderness,  oropharynx pink and moist.  Neck supple no adenopathy.  Chest: Clear to auscultation bilaterally.  CVS: S1, S2 no murmurs, no S3.  ABD: Soft non tender. Bowel sounds normal.  Ext: No edema  MS: Adequate ROM spine, shoulders, hips and knees.  Skin: Intact, no ulcerations or rash noted.  Psych: Good eye contact, normal affect. Memory intact not anxious or depressed appearing.  CNS: CN 2-12 intact, power, tone and sensation normal throughout.         Assessment & Plan:

## 2012-04-05 NOTE — Patient Instructions (Addendum)
F/u in 2 month  Dose increase in toprol to 100mg  one daily. Please take TWO 50mg  tablets once daily, till done.   You will be referred to a rheumatologist in [place], we will give you the contact info to make your own appointment.  Please limit the use of ibuprofen and tylenol as discussed.  New medication at bedtime for muscle spasms, that will hopefully assist with sleep at  Night.  Please commit to physical activity every day and stretches, this is good for fibromyalgia.  Please schedule your mammogram

## 2012-04-10 ENCOUNTER — Emergency Department (HOSPITAL_COMMUNITY): Payer: Managed Care, Other (non HMO)

## 2012-04-10 ENCOUNTER — Encounter (HOSPITAL_COMMUNITY): Payer: Self-pay | Admitting: *Deleted

## 2012-04-10 ENCOUNTER — Emergency Department (HOSPITAL_COMMUNITY)
Admission: EM | Admit: 2012-04-10 | Discharge: 2012-04-10 | Disposition: A | Discharge status: Home / Self Care | Payer: Managed Care, Other (non HMO) | Attending: Emergency Medicine | Admitting: Emergency Medicine

## 2012-04-10 DIAGNOSIS — Z833 Family history of diabetes mellitus: Secondary | ICD-10-CM | POA: Insufficient documentation

## 2012-04-10 DIAGNOSIS — G43909 Migraine, unspecified, not intractable, without status migrainosus: Secondary | ICD-10-CM | POA: Insufficient documentation

## 2012-04-10 DIAGNOSIS — X500XXA Overexertion from strenuous movement or load, initial encounter: Secondary | ICD-10-CM | POA: Insufficient documentation

## 2012-04-10 DIAGNOSIS — Z8249 Family history of ischemic heart disease and other diseases of the circulatory system: Secondary | ICD-10-CM | POA: Insufficient documentation

## 2012-04-10 DIAGNOSIS — F411 Generalized anxiety disorder: Secondary | ICD-10-CM | POA: Insufficient documentation

## 2012-04-10 DIAGNOSIS — Z820 Family history of epilepsy and other diseases of the nervous system: Secondary | ICD-10-CM | POA: Insufficient documentation

## 2012-04-10 DIAGNOSIS — I10 Essential (primary) hypertension: Secondary | ICD-10-CM | POA: Insufficient documentation

## 2012-04-10 DIAGNOSIS — Z800 Family history of malignant neoplasm of digestive organs: Secondary | ICD-10-CM | POA: Insufficient documentation

## 2012-04-10 DIAGNOSIS — S8990XA Unspecified injury of unspecified lower leg, initial encounter: Secondary | ICD-10-CM | POA: Insufficient documentation

## 2012-04-10 DIAGNOSIS — IMO0001 Reserved for inherently not codable concepts without codable children: Secondary | ICD-10-CM | POA: Insufficient documentation

## 2012-04-10 DIAGNOSIS — M25569 Pain in unspecified knee: Secondary | ICD-10-CM

## 2012-04-10 MED ORDER — HYDROCODONE-ACETAMINOPHEN 5-325 MG PO TABS
1.0000 | ORAL_TABLET | Freq: Once | ORAL | Status: AC
  Administered 2012-04-10: 1 via ORAL
  Filled 2012-04-10: qty 1

## 2012-04-10 MED ORDER — HYDROCODONE-ACETAMINOPHEN 5-325 MG PO TABS: 1.0000 | ORAL_TABLET | ORAL | Status: AC | PRN

## 2012-04-10 MED ORDER — IBUPROFEN 800 MG PO TABS: 800.0000 mg | ORAL_TABLET | Freq: Three times a day (TID) | ORAL | Status: DC

## 2012-04-10 MED ORDER — IBUPROFEN 800 MG PO TABS
800.0000 mg | ORAL_TABLET | Freq: Once | ORAL | Status: AC
  Administered 2012-04-10: 800 mg via ORAL
  Filled 2012-04-10: qty 1

## 2012-04-10 NOTE — ED Provider Notes (Signed)
History     CSN: [id number]  Arrival date & time 04/10/12  4098   First MD Initiated Contact with Patient 04/10/12 0550      Chief Complaint  Patient presents with  . Knee Pain    Left knee    (Consider location/radiation/quality/duration/timing/severity/associated sxs/prior treatment) HPI Shirley Bush is a 56 y.o. female who presents to the Emergency Department complaining of left knee pain after a twisting injury around 2:00 AM where she stepped wrong. She is c/o medial knee pain worse with walking. Denies locking of the knee or weakness. Has taken no medicines.  PCP Dr. Lodema Hong  Past Medical History  Diagnosis Date  . Otitis media   . Hypertension   . FHx: migraine headaches   . Fibromyalgia syndrome 2011  . Anxiety     Past Surgical History  Procedure Date  . Tubal ligation 1992  . Cesarean section 1978  . Cesarean section 1980  . Cesarean section 1992  . Umbilical hernia repair 2008  . Left shoulder arthroscopy 2006  . Right shoulder 2007  . Left shoulder arthroscopy 2009    Family History  Problem Relation Age of Onset  . Hypertension Mother   . Hypertension Father   . Alcohol abuse Father   . Seizures Father   . Migraines Sister   . Colon cancer Sister   . Thyroid disease Sister   . Diabetes Sister   . Cancer Sister     History  Substance Use Topics  . Smoking status: Never Smoker   . Smokeless tobacco: Never Used  . Alcohol Use: No    OB History    Grav Para Term Preterm Abortions TAB SAB Ect Mult Living   4 3 3  1     3       Review of Systems  Constitutional: Negative for fever.       10 Systems reviewed and are negative for acute change except as noted in the HPI.  HENT: Negative for congestion.   Eyes: Negative for discharge and redness.  Respiratory: Negative for cough and shortness of breath.   Cardiovascular: Negative for chest pain.  Gastrointestinal: Negative for vomiting and abdominal pain.  Musculoskeletal: Negative for  back pain.       Left knee pain  Skin: Negative for rash.  Neurological: Negative for syncope, numbness and headaches.  Psychiatric/Behavioral:       No behavior change.    Allergies  Review of patient's allergies indicates no known allergies.  Home Medications   Current Outpatient Rx  Name Route Sig Dispense Refill  . ASPIRIN-ACETAMINOPHEN-CAFFEINE 250-250-65 MG PO TABS Oral Take 1 tablet by mouth every 6 (six) hours as needed.    Marland Kitchen CALCIUM-VITAMIN D 250-125 MG-UNIT PO TABS Oral Take 1 tablet by mouth daily.     Marland Kitchen DIAZEPAM 2 MG PO TABS  One tablet at bedtime for muscle spasm 30 tablet 1  . HYDROCODONE-ACETAMINOPHEN 5-500 MG PO TABS  One twice daily , as needed for headache 30 tablet 0  . IBUPROFEN 200 MG PO CAPS Oral Take by mouth.    . METOPROLOL SUCCINATE ER 100 MG PO TB24 Oral Take 1 tablet (100 mg total) by mouth daily. Take with or immediately following a meal. 30 tablet 11    Dose increase effective 04/05/2012. Discontinue 5 ...  . HAIR/SKIN/NAILS PO Oral Take by mouth. 3 tabs daily     . NAPROXEN SODIUM 220 MG PO TABS Oral Take 220 mg by mouth 2 (  two) times daily with a meal.    . PROMETHAZINE HCL 25 MG PO TABS  One half to one tablet once daily, as needed for headache and nausea 30 tablet 0    BP 188/93  Pulse 70  Temp 97.7 F (36.5 C) (Oral)  Resp 16  Ht 5\' 3"  (1.6 m)  Wt 147 lb (66.679 kg)  BMI 26.04 kg/m2  SpO2 98%  Physical Exam  Nursing note and vitals reviewed. Constitutional:       Awake, alert, nontoxic appearance.  HENT:  Head: Atraumatic.  Eyes: Right eye exhibits no discharge. Left eye exhibits no discharge.  Neck: Neck supple.  Cardiovascular: Normal heart sounds.   Pulmonary/Chest: Effort normal and breath sounds normal. She exhibits no tenderness.  Abdominal: Soft. There is no tenderness. There is no rebound.  Musculoskeletal: She exhibits no tenderness.       Baseline ROM, no obvious new focal weakness.Focal tenderness to medial joint line  with palpation. No crepitus. No effusion noted. Pulses 2+  Neurological:       Mental status and motor strength appears baseline for patient and situation.  Skin: No rash noted.  Psychiatric: She has a normal mood and affect.    ED Course  Procedures (including critical care time)  Dg Knee Complete 4 Views Left  04/10/2012  *RADIOLOGY REPORT*  Clinical Data: Left knee pain secondary to a twisting injury.  LEFT KNEE - COMPLETE 4+ VIEW  Comparison: None.  Findings: There is no fracture, dislocation, or joint effusion. Slight marginal spur formation in the patellofemoral and medial compartments.  IMPRESSION: No acute abnormalities.   Original Report Authenticated By: Gwynn Burly, M.D.     No diagnosis found.    MDM  Patient with a twisting injury to left knee. Xrays without acute findings. Suspect internal derangement of the knee. Placed in a knee immobilizer and crutches.Referral to orthopedist. Pt stable in ED with no significant deterioration in condition.The patient appears reasonably screened and/or stabilized for discharge and I doubt any other medical condition or other [facility] requiring further screening, evaluation, or treatment in the ED at this time prior to discharge.  MDM Reviewed: nursing note and vitals Interpretation: x-ray           Nicoletta Dress. Colon Branch, MD 04/10/12 [phone]

## 2012-04-10 NOTE — ED Notes (Signed)
Left knee pain started at work around 0200 after stepping back against a rack at work and twisted her knee.

## 2012-05-01 ENCOUNTER — Other Ambulatory Visit: Payer: Self-pay | Admitting: Family Medicine

## 2012-05-01 DIAGNOSIS — IMO0001 Reserved for inherently not codable concepts without codable children: Secondary | ICD-10-CM

## 2012-05-17 ENCOUNTER — Ambulatory Visit (HOSPITAL_COMMUNITY): Payer: Managed Care, Other (non HMO)

## 2012-05-24 ENCOUNTER — Ambulatory Visit (HOSPITAL_COMMUNITY): Payer: Managed Care, Other (non HMO)

## 2012-05-31 ENCOUNTER — Ambulatory Visit: Payer: Managed Care, Other (non HMO) | Admitting: Family Medicine

## 2013-01-27 ENCOUNTER — Ambulatory Visit: Payer: Managed Care, Other (non HMO) | Admitting: Family Medicine

## 2013-01-31 ENCOUNTER — Other Ambulatory Visit: Payer: Self-pay

## 2013-01-31 ENCOUNTER — Telehealth: Payer: Self-pay | Admitting: Family Medicine

## 2013-01-31 DIAGNOSIS — G43909 Migraine, unspecified, not intractable, without status migrainosus: Secondary | ICD-10-CM

## 2013-01-31 MED ORDER — IBUPROFEN 800 MG PO TABS: 800.0000 mg | ORAL_TABLET | Freq: Three times a day (TID) | ORAL | Status: DC

## 2013-01-31 MED ORDER — HYDROCODONE-ACETAMINOPHEN 5-500 MG PO TABS: ORAL_TABLET | ORAL | Status: DC

## 2013-01-31 NOTE — Telephone Encounter (Signed)
New rx called in for patient.

## 2013-02-07 ENCOUNTER — Ambulatory Visit (INDEPENDENT_AMBULATORY_CARE_PROVIDER_SITE_OTHER): Payer: Managed Care, Other (non HMO) | Admitting: Family Medicine

## 2013-02-07 ENCOUNTER — Encounter: Payer: Self-pay | Admitting: Family Medicine

## 2013-02-07 VITALS — BP 200/110 | HR 69 | Resp 16 | Wt 137.8 lb

## 2013-02-07 DIAGNOSIS — R5383 Other fatigue: Secondary | ICD-10-CM

## 2013-02-07 DIAGNOSIS — F329 Major depressive disorder, single episode, unspecified: Secondary | ICD-10-CM

## 2013-02-07 DIAGNOSIS — Z1239 Encounter for other screening for malignant neoplasm of breast: Secondary | ICD-10-CM

## 2013-02-07 DIAGNOSIS — M797 Fibromyalgia: Secondary | ICD-10-CM

## 2013-02-07 DIAGNOSIS — E785 Hyperlipidemia, unspecified: Secondary | ICD-10-CM

## 2013-02-07 DIAGNOSIS — I10 Essential (primary) hypertension: Secondary | ICD-10-CM

## 2013-02-07 DIAGNOSIS — IMO0001 Reserved for inherently not codable concepts without codable children: Secondary | ICD-10-CM

## 2013-02-07 DIAGNOSIS — Z139 Encounter for screening, unspecified: Secondary | ICD-10-CM

## 2013-02-07 DIAGNOSIS — M549 Dorsalgia, unspecified: Secondary | ICD-10-CM

## 2013-02-07 DIAGNOSIS — R5381 Other malaise: Secondary | ICD-10-CM

## 2013-02-07 DIAGNOSIS — M25562 Pain in left knee: Secondary | ICD-10-CM

## 2013-02-07 DIAGNOSIS — F3289 Other specified depressive episodes: Secondary | ICD-10-CM

## 2013-02-07 DIAGNOSIS — M25569 Pain in unspecified knee: Secondary | ICD-10-CM

## 2013-02-07 MED ORDER — CLONIDINE HCL 0.1 MG PO TABS: 0.1000 mg | ORAL_TABLET | Freq: Every day | ORAL | Status: DC

## 2013-02-07 MED ORDER — HYDROCODONE-ACETAMINOPHEN 5-325 MG PO TABS: ORAL_TABLET | ORAL | Status: AC

## 2013-02-07 MED ORDER — METOPROLOL SUCCINATE ER 100 MG PO TB24: 100.0000 mg | ORAL_TABLET | Freq: Every day | ORAL | Status: DC

## 2013-02-07 MED ORDER — HYDROCODONE-ACETAMINOPHEN 5-300 MG PO TABS: ORAL_TABLET | ORAL | Status: DC

## 2013-02-07 NOTE — Progress Notes (Signed)
  Subjective:    Patient ID: Shirley Bush, female    DOB: 08/02/1955, 57 y.o.   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 Has no clear recall of specific injury on the job or other that started this problem Record review shows that she has had epidural injections in 2000 Feels as though she will fall  at times, no fall, no incontinence of stool or urine  2 month h/o left knee pain and limitation in movement unable to fully flex x 2 month. Pain is across knee cap Says that last September she did injure the left knee on the job, saw her company Doctor and was released after 1 visit. States that up until 2 months ago she has been having no knee problems, no associated  Injury with this pain Has discontinued her blood pressure meds, and no longer checks at home, because she Knows it will be high"    Review of Systems See HPI Denies recent fever or chills. Denies sinus pressure, nasal congestion, ear pain or sore throat. Denies chest congestion, productive cough or wheezing. Denies chest pains, palpitations and leg swelling Denies abdominal pain, nausea, vomiting,diarrhea or constipation.   Denies dysuria, frequency, hesitancy or incontinence. Denies skin break down or rash.        Objective:   Physical Exam Patient alert and oriented and in no cardiopulmonary distress.Tearful at times  HEENT: No facial asymmetry, EOMI, no sinus tenderness,  oropharynx pink and moist.  Neck supple no adenopathy.  Chest: Clear to auscultation bilaterally.  CVS: S1, S2 no murmurs, no  S3.  ABD: Soft non tender. Bowel sounds normal.  Ext: No edema  MS: decreased  ROM spine, and left knee, tender over patella of left knee  Skin: Intact, no ulcerations or rash noted.  Psych: Poor  eye contact, . Memory intact  Anxious, tearful and  depressed appearing.  CNS: CN 2-12 intact, decreased power and sensation on left lower extremity      Assessment & Plan:

## 2013-02-07 NOTE — Assessment & Plan Note (Signed)
2 month h/o back pain radiating to left lower foot with left lower extremity numbness

## 2013-02-07 NOTE — Patient Instructions (Addendum)
F/u in 2 weeks, call with problems before this as we  Discussed.  New for your blood pressure are metoprolol and clonidine, please take them consistently at the same time every day for maximal benefit, clonidine is to be taken at bedtime since it has a sleepy side effect and you request medication to help with sleep  Fasting CBC, lipid, cmp, TSH and vit D tomoorw. Results will be released to you with a message   STOP ibuprofen , and alleve  For pain vicodin one daily for 30 days only is being prescribed. Chronic pain management will be through orthopedics and/or rheumatology  You are referred to rheumatologist about about fibromyalgia managem,ent, you NEEd to go   You are referred to Dr Eulah Pont re left knee   You are referred  For mammogram and MRI of your low back , you will be called with appointment information. Call back by next Tuesday if you have not heard from [place]

## 2013-02-07 NOTE — Assessment & Plan Note (Signed)
Pt c/o increasing pain and debility, had got discouraged and stopped seeing anyone for help, now wants to seek help once more

## 2013-02-08 LAB — LIPID PANEL
Cholesterol: 173 mg/dL (ref 0–200)
Total CHOL/HDL Ratio: 2.8 Ratio
Triglycerides: 93 mg/dL (ref <150)
VLDL: 19 mg/dL (ref 0–40)

## 2013-02-08 LAB — CBC WITH DIFFERENTIAL/PLATELET
Hemoglobin: 13.1 g/dL (ref 12.0–15.0)
Lymphs Abs: 2.7 10*3/uL (ref 0.7–4.0)
Monocytes Relative: 9 % (ref 3–12)
Neutro Abs: 2.7 10*3/uL (ref 1.7–7.7)
Neutrophils Relative %: 42 % — ABNORMAL LOW (ref 43–77)
RBC: 4.21 MIL/uL (ref 3.87–5.11)

## 2013-02-08 LAB — COMPREHENSIVE METABOLIC PANEL
AST: 23 U/L (ref 0–37)
Alkaline Phosphatase: 55 U/L (ref 39–117)
BUN: 13 mg/dL (ref 6–23)
Calcium: 9.6 mg/dL (ref 8.4–10.5)
Chloride: 105 mEq/L (ref 96–112)
Creat: 0.78 mg/dL (ref 0.50–1.10)

## 2013-02-08 NOTE — Assessment & Plan Note (Signed)
Increased pain and instability, refer back to ortho for re eval

## 2013-02-08 NOTE — Assessment & Plan Note (Signed)
Increasingly debilitating generalized joint and muscle pains, refer to rheumatology for re eval

## 2013-02-08 NOTE — Assessment & Plan Note (Signed)
Uncontrolled and non compliant Pt to resume medication DASH diet and commitment to daily physical activity for a minimum of 30 minutes discussed and encouraged, as a part of hypertension management. The importance of attaining a healthy weight is also discussed.

## 2013-02-08 NOTE — Assessment & Plan Note (Signed)
Stressed, "tired" of hurting, PHQ 9 score of 8 , no medication indicated , though cymbalta would be an excellent possibility to help her fibromyalgia also, I will wait on rheumatology to decide

## 2013-02-10 LAB — VITAMIN D 25 HYDROXY (VIT D DEFICIENCY, FRACTURES): Vit D, 25-Hydroxy: 72 ng/mL (ref 30–89)

## 2013-02-20 ENCOUNTER — Other Ambulatory Visit (HOSPITAL_COMMUNITY): Payer: Managed Care, Other (non HMO)

## 2013-02-20 ENCOUNTER — Ambulatory Visit (HOSPITAL_COMMUNITY): Payer: Managed Care, Other (non HMO)

## 2013-02-21 ENCOUNTER — Ambulatory Visit (HOSPITAL_COMMUNITY): Payer: Managed Care, Other (non HMO)

## 2013-02-21 ENCOUNTER — Ambulatory Visit (INDEPENDENT_AMBULATORY_CARE_PROVIDER_SITE_OTHER): Payer: Managed Care, Other (non HMO) | Admitting: Family Medicine

## 2013-02-21 ENCOUNTER — Encounter: Payer: Self-pay | Admitting: Family Medicine

## 2013-02-21 VITALS — BP 180/90 | HR 65 | Resp 16 | Ht 63.0 in | Wt 137.8 lb

## 2013-02-21 DIAGNOSIS — I10 Essential (primary) hypertension: Secondary | ICD-10-CM

## 2013-02-21 DIAGNOSIS — M797 Fibromyalgia: Secondary | ICD-10-CM

## 2013-02-21 DIAGNOSIS — M25569 Pain in unspecified knee: Secondary | ICD-10-CM

## 2013-02-21 DIAGNOSIS — M25562 Pain in left knee: Secondary | ICD-10-CM

## 2013-02-21 DIAGNOSIS — IMO0001 Reserved for inherently not codable concepts without codable children: Secondary | ICD-10-CM

## 2013-02-21 MED ORDER — CLONIDINE HCL 0.2 MG PO TABS: ORAL_TABLET | ORAL | Status: DC

## 2013-02-21 MED ORDER — LOSARTAN POTASSIUM-HCTZ 50-12.5 MG PO TABS: 1.0000 | ORAL_TABLET | Freq: Every day | ORAL | Status: DC

## 2013-02-21 NOTE — Progress Notes (Signed)
  Subjective:    Patient ID: Shirley Bush, female    DOB: November 13, 1955, 57 y.o.   MRN: [id number]  HPI The PT is here for follow up and re-evaluation of chronic medical conditions, medication management and review of any available recent lab and radiology data.  Preventive health is updated, specifically  Cancer screening and Immunization.   Questions or concerns regarding consultations or procedures which the PT has had in the interim are  Addressed.Has upcoming rheumatology appt , and today will have MRI of knee per ortho The PT c/o cramps with BP med , but is willing to take it as she is aware of how high her bP is and she is assured of good blood work, will try mustard There are no new concerns.       Review of Systems See HPI Denies recent fever or chills. Denies sinus pressure, nasal congestion, ear pain or sore throat. Denies chest congestion, productive cough or wheezing. Denies chest pains, palpitations and leg swelling Denies abdominal pain, nausea, vomiting,diarrhea or constipation.   Denies dysuria, frequency, hesitancy or incontinence. . Denies headaches, seizures, numbness, or tingling. Denies depression, anxiety or insomnia. Denies skin break down or rash.        Objective:   Physical Exam Patient alert and oriented and in no cardiopulmonary distress.  HEENT: No facial asymmetry, EOMI, no sinus tenderness,  oropharynx pink and moist.  Neck supple no adenopathy.  Chest: Clear to auscultation bilaterally.  CVS: S1, S2 no murmurs, no S3.  ABD: Soft non tender. Bowel sounds normal.  Ext: No edema  MS: Adequate ROM spine, shoulders, hips and reduced in  knees.  Skin: Intact, no ulcerations or rash noted.  Psych: Good eye contact, normal affect. Memory intact less  anxious or depressed appearing.  CNS: CN 2-12 intact, power, tone and sensation normal throughout.        Assessment & Plan:

## 2013-02-21 NOTE — Patient Instructions (Addendum)
F/u in 5 weeks, call if you need me before  Dose of clonidine increased to 0.2mg  one at bedtime. OK to take TWO 0.1mg  tablets till done, but pls collect increased dose of 0.2mg  tablet as soon as possible, that will be one at bedtime Other new medition  For blood pressure start today please hyzaar 50/12.5 one daily, take in the morning since this will make you urinate more  It is important that you exercise regularly at least 30 minutes 5 times a week. If you develop chest pain, have severe difficulty breathing, or feel very tired, stop exercising immediately and seek medical attention

## 2013-02-23 NOTE — Assessment & Plan Note (Signed)
Improved but still markedly elevated. Gradual inc in med dose and addition of hyzaar DASH diet and commitment to daily physical activity for a minimum of 30 minutes discussed and encouraged, as a part of hypertension management. The importance of attaining a healthy weight is also discussed.

## 2013-02-23 NOTE — Assessment & Plan Note (Signed)
Unchanged, MRI today per ortho

## 2013-02-23 NOTE — Assessment & Plan Note (Signed)
Unchanged, upcoming appt with rheumatology

## 2013-02-25 ENCOUNTER — Other Ambulatory Visit: Payer: Self-pay | Admitting: Family Medicine

## 2013-02-25 ENCOUNTER — Ambulatory Visit (HOSPITAL_COMMUNITY)
Admission: RE | Admit: 2013-02-25 | Discharge: 2013-02-25 | Disposition: A | Discharge status: Home / Self Care | Payer: Managed Care, Other (non HMO) | Source: Ambulatory Visit | Attending: Family Medicine | Admitting: Family Medicine

## 2013-02-25 DIAGNOSIS — M549 Dorsalgia, unspecified: Secondary | ICD-10-CM

## 2013-02-25 DIAGNOSIS — M5380 Other specified dorsopathies, site unspecified: Secondary | ICD-10-CM | POA: Insufficient documentation

## 2013-02-25 DIAGNOSIS — R209 Unspecified disturbances of skin sensation: Secondary | ICD-10-CM | POA: Insufficient documentation

## 2013-02-25 DIAGNOSIS — Z1231 Encounter for screening mammogram for malignant neoplasm of breast: Secondary | ICD-10-CM | POA: Insufficient documentation

## 2013-02-25 DIAGNOSIS — M545 Low back pain, unspecified: Secondary | ICD-10-CM | POA: Insufficient documentation

## 2013-02-25 DIAGNOSIS — M5126 Other intervertebral disc displacement, lumbar region: Secondary | ICD-10-CM | POA: Insufficient documentation

## 2013-02-25 DIAGNOSIS — Z1239 Encounter for other screening for malignant neoplasm of breast: Secondary | ICD-10-CM

## 2013-02-28 DIAGNOSIS — M064 Inflammatory polyarthropathy: Secondary | ICD-10-CM | POA: Insufficient documentation

## 2013-04-03 ENCOUNTER — Telehealth: Payer: Self-pay | Admitting: Family Medicine

## 2013-04-03 NOTE — Telephone Encounter (Signed)
Noted  

## 2013-04-03 NOTE — Telephone Encounter (Signed)
noted 

## 2013-04-04 ENCOUNTER — Ambulatory Visit: Payer: Managed Care, Other (non HMO) | Admitting: Family Medicine

## 2013-04-11 ENCOUNTER — Other Ambulatory Visit: Payer: Self-pay | Admitting: Family Medicine

## 2013-04-11 DIAGNOSIS — R1900 Intra-abdominal and pelvic swelling, mass and lump, unspecified site: Secondary | ICD-10-CM

## 2013-04-16 ENCOUNTER — Telehealth: Payer: Self-pay | Admitting: Family Medicine

## 2013-04-16 DIAGNOSIS — S83249A Other tear of medial meniscus, current injury, unspecified knee, initial encounter: Secondary | ICD-10-CM

## 2013-04-16 HISTORY — DX: Other tear of medial meniscus, current injury, unspecified knee, initial encounter: S83.249A

## 2013-04-16 NOTE — Telephone Encounter (Signed)
Please contact pt, let her know that she can have follow up studies done of her known fibroids, if she is having lower abdominal or pelvic painpelvic pressure ,or bleeding, then I can order pelvic US , trans abdominal and vaginal I had to leave her a message to call you back Please let me hear her response once she calls back

## 2013-04-17 ENCOUNTER — Encounter (HOSPITAL_BASED_OUTPATIENT_CLINIC_OR_DEPARTMENT_OTHER): Payer: Self-pay | Admitting: *Deleted

## 2013-04-17 ENCOUNTER — Telehealth: Payer: Self-pay | Admitting: Family Medicine

## 2013-04-17 ENCOUNTER — Other Ambulatory Visit: Payer: Self-pay | Admitting: Family Medicine

## 2013-04-17 DIAGNOSIS — R102 Pelvic and perineal pain: Secondary | ICD-10-CM

## 2013-04-17 DIAGNOSIS — I10 Essential (primary) hypertension: Secondary | ICD-10-CM

## 2013-04-17 DIAGNOSIS — D219 Benign neoplasm of connective and other soft tissue, unspecified: Secondary | ICD-10-CM

## 2013-04-17 DIAGNOSIS — R1900 Intra-abdominal and pelvic swelling, mass and lump, unspecified site: Secondary | ICD-10-CM

## 2013-04-17 MED ORDER — METOPROLOL SUCCINATE ER 100 MG PO TB24: 100.0000 mg | ORAL_TABLET | Freq: Every day | ORAL | Status: DC

## 2013-04-17 NOTE — Telephone Encounter (Signed)
Med sent.

## 2013-04-17 NOTE — Pre-Procedure Instructions (Signed)
To come for BMET and EKG 

## 2013-04-17 NOTE — Telephone Encounter (Signed)
I spoke directly with the pt explaining the situation with regards to imaging studies ordered. She does c/o lower abdominal pain and pressure in addition to the back pain, she has known fibroids , due to her complaint, I am re ordering pelvic strudies to furhter address this, pls see order, she is aware

## 2013-04-18 ENCOUNTER — Encounter (HOSPITAL_BASED_OUTPATIENT_CLINIC_OR_DEPARTMENT_OTHER)
Admission: RE | Admit: 2013-04-18 | Discharge: 2013-04-18 | Disposition: A | Discharge status: Home / Self Care | Payer: Managed Care, Other (non HMO) | Source: Ambulatory Visit | Attending: Orthopedic Surgery | Admitting: Orthopedic Surgery

## 2013-04-18 ENCOUNTER — Other Ambulatory Visit: Payer: Self-pay

## 2013-04-18 DIAGNOSIS — M79641 Pain in right hand: Secondary | ICD-10-CM | POA: Insufficient documentation

## 2013-04-18 DIAGNOSIS — M79673 Pain in unspecified foot: Secondary | ICD-10-CM | POA: Insufficient documentation

## 2013-04-18 DIAGNOSIS — Z01810 Encounter for preprocedural cardiovascular examination: Secondary | ICD-10-CM | POA: Insufficient documentation

## 2013-04-18 LAB — BASIC METABOLIC PANEL
GFR calc Af Amer: >90 mL/min (ref >90)
GFR calc non Af Amer: >90 mL/min (ref >90)
Glucose, Bld: 106 mg/dL — ABNORMAL HIGH (ref 70–99)
Potassium: 4.4 mEq/L (ref 3.5–5.1)
Sodium: 138 mEq/L (ref 135–145)

## 2013-04-21 ENCOUNTER — Other Ambulatory Visit: Payer: Self-pay | Admitting: Orthopedic Surgery

## 2013-04-21 ENCOUNTER — Ambulatory Visit (HOSPITAL_COMMUNITY): Admission: RE | Admit: 2013-04-21 | Payer: Managed Care, Other (non HMO) | Source: Ambulatory Visit

## 2013-04-21 ENCOUNTER — Ambulatory Visit (HOSPITAL_COMMUNITY): Payer: Managed Care, Other (non HMO)

## 2013-04-21 NOTE — Telephone Encounter (Signed)
Patient is aware 

## 2013-04-23 NOTE — H&P (Signed)
[facility] [address]   [address] [place], [place] [address] [phone] A Division of [facility]  Loreta Ave, M.D.   Robert A. Thurston Hole, M.D.   Burnell Blanks, M.D.   Eulas Post, M.D.   Lunette Stands, M.D Buford Dresser, M.D.  Charlsie Quest, M.D.   Estell Harpin, M.D.   Melina Fiddler, M.D. Genene Churn. Barry Dienes, PA-C            Kirstin A. Shepperson, PA-C Josh Leeds, PA-C [place], [place]   RE: Shirley Bush, Shirley Bush                                [phone]      DOB: January 12, 1956 PROGRESS NOTE: 02-11-13 Shirley Bush is a new patient to the office.  Presents for evaluation and treatment recommendation for her left knee.  She has been followed by Dr. Syliva Overman.  There are numerous underlying issues including fibromyalgia, as well as issues with her lumbar spine.  She is awaiting workup of her back with an MRI scan.  She is coming in specifically for me to see her for her left knee.  She injured this last year while working.  Vertical load twisting injury.  Pain medially.  This did get better, but never returned to normal.  It has gotten worse over the last couple of months.  Soreness with activity.  Medial mechanical symptoms.  Gets very stiff after prolonged sitting.  Difficulty going up and down stairs.  No real giving way.  No previous intervention, workup or treatment.  Followed for her fibromyalgia by Dr. Kellie Simmering.   Remaining history is reviewed, updated and included in the chart, including discussion of numerous issues as outlined above.  EXAMINATION: General exam is outlined and included in the chart.  Specifically, 57 year-old female.  Height: 5?3.  Weight: 137 pounds.  Blood pressure: 178/104 and she admits she did not take her hypertension medicine this morning.  Other than that she has typical soft tissue points from fibromyalgia.  A little bit of a slow gait I think more from her back than her knee.   Both knees have good motion.  2+ patellofemoral crepitus, left greater than right.  Left knee point tender medial joint line.  Positive medial McMurray's.  Ligaments are stable.  She is neurovascularly intact distally.  A little bit of varus on both sides, not too extreme.     X-RAYS: Four view standing x-ray shows some moderate medial narrowing of both knees, right equaling left.  Certainly not bone on bone.  Patellofemoral joint doesn't look too bad.    DISPOSITION:  At this point in time it is time to get a definitive diagnosis for treatment recommendation.  We are going to get an MRI scan looking at the patellofemoral joint, as well as the medial meniscus.  She will follow up with me when that is complete, tailoring further treatment depending on what that shows.    Loreta Ave, M.D.   Electronically verified by Loreta Ave, M.D. DFM:jjh Cc: Dr. Syliva Overman, fax: [phone] Cc: Dr. Stacey Drain, fax: [phone] D 02-12-13  [facility] [address]   [address] [place], [place] [address] [phone] A Division of [facility]  Loreta Ave, M.D.   Robert A. Thurston Hole, M.D.   Burnell Blanks, M.D.   Joshua P.  Dion Saucier, M.D.   Lunette Stands, M.D Jewel Baize. Eulah Pont, M.D.  Buford Dresser, M.D.  Charlsie Quest, M.D.  Estell Harpin, M.D.   Melina Fiddler, M.D. Kirstin A. Shepperson, PA-C  Josh Manchester, PA-C [place], [place]    RE: Shirley Bush, Shirley Bush   [phone]      DOB: July 16, 1956 PROGRESS NOTE: 02-25-13 Shirley Bush comes in for follow-up.  Recent review of the MRI of the left knee.  I have gone over her workup and treatment to date.  I have looked at her previous x-rays that showed some moderate medial narrowing both knees.  Her recent MRI scan shows complex sharing midbody posteromedial meniscus where she has most of her symptoms.  There are changes of chondromalacia of the patellofemoral joint and medial  compartment but these to no look too extreme.  She certainly has sufficient symptoms to proceed.  I have reviewed all of this with her.    The plan is exam under anesthesia, arthroscopy,  care of her meniscus tear and chondroplasia.  Her outcome is going to be very dependent on degree of degenerative changes that are found in her knee and I have strongly emphasized this to her.  Paperwork is completed.  All questions were answered.  More than 25 minutes were spent face-to-face covering all of this with her.  I will see her at the time of operative intervention.     Loreta Ave, M.D.  Electronically verified by Loreta Ave, M.D. DFM:gde D 02-25-13 T 02-26-13  Chart, studies, history reviewed,  No change. 04/23/13

## 2013-04-24 ENCOUNTER — Encounter (HOSPITAL_BASED_OUTPATIENT_CLINIC_OR_DEPARTMENT_OTHER)
Admission: RE | Disposition: A | Discharge status: Home / Self Care | Payer: Self-pay | Source: Ambulatory Visit | Attending: Orthopedic Surgery

## 2013-04-24 ENCOUNTER — Encounter (HOSPITAL_BASED_OUTPATIENT_CLINIC_OR_DEPARTMENT_OTHER): Payer: Self-pay | Admitting: *Deleted

## 2013-04-24 ENCOUNTER — Ambulatory Visit (HOSPITAL_BASED_OUTPATIENT_CLINIC_OR_DEPARTMENT_OTHER)
Admission: RE | Admit: 2013-04-24 | Discharge: 2013-04-24 | Disposition: A | Discharge status: Home / Self Care | Payer: Managed Care, Other (non HMO) | Source: Ambulatory Visit | Attending: Orthopedic Surgery | Admitting: Orthopedic Surgery

## 2013-04-24 ENCOUNTER — Encounter (HOSPITAL_BASED_OUTPATIENT_CLINIC_OR_DEPARTMENT_OTHER): Payer: Managed Care, Other (non HMO) | Admitting: Anesthesiology

## 2013-04-24 ENCOUNTER — Ambulatory Visit (HOSPITAL_BASED_OUTPATIENT_CLINIC_OR_DEPARTMENT_OTHER): Payer: Managed Care, Other (non HMO) | Admitting: Anesthesiology

## 2013-04-24 DIAGNOSIS — M25562 Pain in left knee: Secondary | ICD-10-CM

## 2013-04-24 DIAGNOSIS — IMO0001 Reserved for inherently not codable concepts without codable children: Secondary | ICD-10-CM | POA: Insufficient documentation

## 2013-04-24 DIAGNOSIS — I10 Essential (primary) hypertension: Secondary | ICD-10-CM | POA: Insufficient documentation

## 2013-04-24 DIAGNOSIS — M23329 Other meniscus derangements, posterior horn of medial meniscus, unspecified knee: Secondary | ICD-10-CM | POA: Insufficient documentation

## 2013-04-24 DIAGNOSIS — F1920 Other psychoactive substance dependence, uncomplicated: Secondary | ICD-10-CM | POA: Insufficient documentation

## 2013-04-24 DIAGNOSIS — M2240 Chondromalacia patellae, unspecified knee: Secondary | ICD-10-CM | POA: Insufficient documentation

## 2013-04-24 HISTORY — DX: Other tear of medial meniscus, current injury, unspecified knee, initial encounter: S83.249A

## 2013-04-24 HISTORY — PX: KNEE ARTHROSCOPY WITH MEDIAL MENISECTOMY: SHX5651

## 2013-04-24 HISTORY — DX: Migraine, unspecified, not intractable, without status migrainosus: G43.909

## 2013-04-24 HISTORY — DX: Fibromyalgia: M79.7

## 2013-04-24 SURGERY — ARTHROSCOPY, KNEE, WITH MEDIAL MENISCECTOMY
Anesthesia: General | Site: Knee | Laterality: Left | Wound class: Clean

## 2013-04-24 MED ORDER — LIDOCAINE HCL (CARDIAC) 20 MG/ML IV SOLN
INTRAVENOUS | Status: DC | PRN
  Administered 2013-04-24: 50 mg via INTRAVENOUS

## 2013-04-24 MED ORDER — FENTANYL CITRATE 0.05 MG/ML IJ SOLN
INTRAMUSCULAR | Status: DC | PRN
  Administered 2013-04-24: 100 ug via INTRAVENOUS
  Administered 2013-04-24 (×2): 25 ug via INTRAVENOUS

## 2013-04-24 MED ORDER — PROPOFOL 10 MG/ML IV BOLUS
INTRAVENOUS | Status: DC | PRN
  Administered 2013-04-24: 200 mg via INTRAVENOUS

## 2013-04-24 MED ORDER — DEXAMETHASONE SODIUM PHOSPHATE 4 MG/ML IJ SOLN
INTRAMUSCULAR | Status: DC | PRN
  Administered 2013-04-24: 10 mg via INTRAVENOUS

## 2013-04-24 MED ORDER — ONDANSETRON HCL 4 MG/2ML IJ SOLN
INTRAMUSCULAR | Status: DC | PRN
  Administered 2013-04-24: 4 mg via INTRAMUSCULAR

## 2013-04-24 MED ORDER — MIDAZOLAM HCL 2 MG/2ML IJ SOLN: 1.0000 mg | INTRAMUSCULAR | Status: DC | PRN

## 2013-04-24 MED ORDER — ACETAMINOPHEN 500 MG PO TABS
1000.0000 mg | ORAL_TABLET | Freq: Once | ORAL | Status: AC
  Administered 2013-04-24: 975 mg via ORAL

## 2013-04-24 MED ORDER — SODIUM CHLORIDE 0.9 % IR SOLN
Status: DC | PRN
  Administered 2013-04-24: 6000 mL

## 2013-04-24 MED ORDER — FENTANYL CITRATE 0.05 MG/ML IJ SOLN: 50.0000 ug | INTRAMUSCULAR | Status: DC | PRN

## 2013-04-24 MED ORDER — HYDROMORPHONE HCL PF 1 MG/ML IJ SOLN
0.2500 mg | INTRAMUSCULAR | Status: DC | PRN
  Administered 2013-04-24 (×2): 0.5 mg via INTRAVENOUS

## 2013-04-24 MED ORDER — LACTATED RINGERS IV SOLN
INTRAVENOUS | Status: DC
  Administered 2013-04-24: 09:00:00 via INTRAVENOUS

## 2013-04-24 MED ORDER — CEFAZOLIN SODIUM-DEXTROSE 2-3 GM-% IV SOLR
2.0000 g | INTRAVENOUS | Status: AC
  Administered 2013-04-24: 2 g via INTRAVENOUS

## 2013-04-24 MED ORDER — MIDAZOLAM HCL 2 MG/ML PO SYRP: 12.0000 mg | ORAL_SOLUTION | Freq: Once | ORAL | Status: DC | PRN

## 2013-04-24 MED ORDER — OXYCODONE-ACETAMINOPHEN 10-325 MG PO TABS: 1.0000 | ORAL_TABLET | ORAL | Status: DC | PRN

## 2013-04-24 MED ORDER — MIDAZOLAM HCL 5 MG/5ML IJ SOLN
INTRAMUSCULAR | Status: DC | PRN
  Administered 2013-04-24: 2 mg via INTRAVENOUS

## 2013-04-24 MED ORDER — CHLORHEXIDINE GLUCONATE 4 % EX LIQD: 60.0000 mL | Freq: Once | CUTANEOUS | Status: DC

## 2013-04-24 MED ORDER — BUPIVACAINE HCL (PF) 0.5 % IJ SOLN
INTRAMUSCULAR | Status: DC | PRN
  Administered 2013-04-24: 25 mL

## 2013-04-24 MED ORDER — METHYLPREDNISOLONE ACETATE 80 MG/ML IJ SUSP
INTRAMUSCULAR | Status: DC | PRN
  Administered 2013-04-24: 80 mg

## 2013-04-24 MED ORDER — OXYCODONE HCL 5 MG PO TABS: 5.0000 mg | ORAL_TABLET | Freq: Once | ORAL | Status: DC | PRN

## 2013-04-24 MED ORDER — LABETALOL HCL 5 MG/ML IV SOLN
INTRAVENOUS | Status: DC | PRN
  Administered 2013-04-24: 5 mg via INTRAVENOUS

## 2013-04-24 MED ORDER — DEXTROSE-NACL 5-0.45 % IV SOLN: INTRAVENOUS | Status: DC

## 2013-04-24 MED ORDER — OXYCODONE HCL 5 MG/5ML PO SOLN: 5.0000 mg | Freq: Once | ORAL | Status: DC | PRN

## 2013-04-24 MED ORDER — KETOROLAC TROMETHAMINE 30 MG/ML IJ SOLN
INTRAMUSCULAR | Status: DC | PRN
  Administered 2013-04-24: 30 mg via INTRAVENOUS

## 2013-04-24 SURGICAL SUPPLY — 40 items
BANDAGE ELASTIC 6 VELCRO ST LF (GAUZE/BANDAGES/DRESSINGS) ×2 IMPLANT
BLADE CUDA 5.5 (BLADE) IMPLANT
BLADE CUDA GRT WHITE 3.5 (BLADE) IMPLANT
BLADE CUTTER GATOR 3.5 (BLADE) ×2 IMPLANT
BLADE CUTTER MENIS 5.5 (BLADE) IMPLANT
BLADE GREAT WHITE 4.2 (BLADE) ×2 IMPLANT
BUR OVAL 4.0 (BURR) IMPLANT
CANISTER OMNI JUG 16 LITER (MISCELLANEOUS) ×2 IMPLANT
CANISTER SUCTION 2500CC (MISCELLANEOUS) IMPLANT
CLOTH BEACON ORANGE TIMEOUT ST (SAFETY) ×2 IMPLANT
CUTTER MENISCUS  4.2MM (BLADE)
CUTTER MENISCUS 4.2MM (BLADE) IMPLANT
DRAPE ARTHROSCOPY W/POUCH 90 (DRAPES) ×2 IMPLANT
DURAPREP 26ML APPLICATOR (WOUND CARE) ×2 IMPLANT
ELECT MENISCUS 165MM 90D (ELECTRODE) IMPLANT
ELECT REM PT RETURN 9FT ADLT (ELECTROSURGICAL) ×2
ELECTRODE REM PT RTRN 9FT ADLT (ELECTROSURGICAL) IMPLANT
GAUZE XEROFORM 1X8 LF (GAUZE/BANDAGES/DRESSINGS) ×2 IMPLANT
GLOVE BIO SURGEON STRL SZ 6.5 (GLOVE) ×1 IMPLANT
GLOVE BIO SURGEON STRL SZ8 (GLOVE) ×2 IMPLANT
GLOVE BIOGEL PI IND STRL 7.0 (GLOVE) IMPLANT
GLOVE BIOGEL PI IND STRL 8.5 (GLOVE) ×1 IMPLANT
GLOVE BIOGEL PI INDICATOR 7.0 (GLOVE) ×1
GLOVE BIOGEL PI INDICATOR 8.5 (GLOVE) ×1
GLOVE ORTHO TXT STRL SZ7.5 (GLOVE) ×2 IMPLANT
GOWN PREVENTION PLUS XLARGE (GOWN DISPOSABLE) ×3 IMPLANT
GOWN STRL REIN 2XL XLG LVL4 (GOWN DISPOSABLE) ×2 IMPLANT
HOLDER KNEE FOAM BLUE (MISCELLANEOUS) ×2 IMPLANT
IV NS IRRIG 3000ML ARTHROMATIC (IV SOLUTION) ×4 IMPLANT
KNEE WRAP E Z 3 GEL PACK (MISCELLANEOUS) ×2 IMPLANT
PACK ARTHROSCOPY DSU (CUSTOM PROCEDURE TRAY) ×2 IMPLANT
PACK BASIN DAY SURGERY FS (CUSTOM PROCEDURE TRAY) ×2 IMPLANT
PENCIL BUTTON HOLSTER BLD 10FT (ELECTRODE) IMPLANT
SET ARTHROSCOPY TUBING (MISCELLANEOUS) ×2
SET ARTHROSCOPY TUBING LN (MISCELLANEOUS) ×1 IMPLANT
SPONGE GAUZE 4X4 12PLY (GAUZE/BANDAGES/DRESSINGS) ×4 IMPLANT
SUT ETHILON 3 0 PS 1 (SUTURE) ×2 IMPLANT
SUT VIC AB 3-0 FS2 27 (SUTURE) IMPLANT
TOWEL OR 17X24 6PK STRL BLUE (TOWEL DISPOSABLE) ×2 IMPLANT
WATER STERILE IRR 1000ML POUR (IV SOLUTION) ×2 IMPLANT

## 2013-04-24 NOTE — Transfer of Care (Signed)
Immediate Anesthesia Transfer of Care Note  Patient: Shirley Bush  Procedure(s) Performed: Procedure(s): LEFT KNEE ARTHROSCOPY CHONDROPLASTY WITH MEDIAL MENISECTOMY (Left)  Patient Location: PACU  Anesthesia Type:General  Level of Consciousness: sedated  Airway & Oxygen Therapy: Patient Spontanous Breathing and Patient connected to face mask oxygen  Post-op Assessment: Report given to PACU RN and Post -op Vital signs reviewed and stable  Post vital signs: Reviewed and stable  Complications: No apparent anesthesia complications

## 2013-04-24 NOTE — Interval H&P Note (Signed)
History and Physical Interval Note:  04/24/2013 7:29 AM  [name]  has presented today for surgery, with the diagnosis of LEFT KNEE MEDIAL MENISCAL TEAR, CHONDROMALACIA 717.7, 836.0  The various methods of treatment have been discussed with the patient and family. After consideration of risks, benefits and other options for treatment, the patient has consented to  Procedure(s): LEFT KNEE ARTHROSCOPY CHONDROPLASTY WITH MEDIAL MENISECTOMY (Left) as a surgical intervention .  The patient's history has been reviewed, patient examined, no change in status, stable for surgery.  I have reviewed the patient's chart and labs.  Questions were answered to the patient's satisfaction.     Shirley Bush

## 2013-04-24 NOTE — Anesthesia Procedure Notes (Signed)
Procedure Name: LMA Insertion Performed by: York Grice Pre-anesthesia Checklist: Patient identified, Emergency Drugs available, Timeout performed, Suction available and Patient being monitored Patient Re-evaluated:Patient Re-evaluated prior to inductionOxygen Delivery Method: Circle system utilized Preoxygenation: Pre-oxygenation with 100% oxygen Intubation Type: IV induction Ventilation: Mask ventilation without difficulty LMA: LMA inserted LMA Size: 4.0 Number of attempts: 1 Placement Confirmation: breath sounds checked- equal and bilateral and positive ETCO2 Tube secured with: Tape Dental Injury: Teeth and Oropharynx as per pre-operative assessment

## 2013-04-24 NOTE — Progress Notes (Signed)
Crutch teaching performed with pt. Pt. Tolerated well, and used proper crutch walking

## 2013-04-24 NOTE — Anesthesia Preprocedure Evaluation (Addendum)
Anesthesia Evaluation  Patient identified by MRN, date of birth, ID band Patient awake    Reviewed: Allergy & Precautions, H&P , NPO status , Patient's Chart, lab work & pertinent test results, reviewed documented beta blocker date and time   Airway Mallampati: I TM Distance: >3 FB Neck ROM: Full    Dental no notable dental hx. (+) Teeth Intact and Dental Advisory Given   Pulmonary neg pulmonary ROS,  breath sounds clear to auscultation  Pulmonary exam normal       Cardiovascular hypertension, On Medications, Pt. on home beta blockers and Pt. on medications Rhythm:Regular Rate:Normal     Neuro/Psych Depression  Neuromuscular disease    GI/Hepatic negative GI ROS, Neg liver ROS,   Endo/Other  negative endocrine ROS  Renal/GU negative Renal ROS  negative genitourinary   Musculoskeletal  (+) Fibromyalgia -, narcotic dependent  Abdominal   Peds  Hematology negative hematology ROS (+)   Anesthesia Other Findings   Reproductive/Obstetrics negative OB ROS                         Anesthesia Physical Anesthesia Plan  ASA: II  Anesthesia Plan: General   Post-op Pain Management:    Induction: Intravenous  Airway Management Planned: LMA  Additional Equipment:   Intra-op Plan:   Post-operative Plan: Extubation in OR  Informed Consent: I have reviewed the patients History and Physical, chart, labs and discussed the procedure including the risks, benefits and alternatives for the proposed anesthesia with the patient or authorized representative who has indicated his/her understanding and acceptance.   Dental advisory given  Plan Discussed with: CRNA  Anesthesia Plan Comments:         Anesthesia Quick Evaluation

## 2013-04-24 NOTE — Anesthesia Postprocedure Evaluation (Signed)
  Anesthesia Post-op Note  Patient: Shirley Bush  Procedure(s) Performed: Procedure(s): LEFT KNEE ARTHROSCOPY CHONDROPLASTY WITH MEDIAL MENISECTOMY (Left)  Patient Location: PACU  Anesthesia Type:General  Level of Consciousness: awake and alert   Airway and Oxygen Therapy: Patient Spontanous Breathing  Post-op Pain: none  Post-op Assessment: Post-op Vital signs reviewed, Patient's Cardiovascular Status Stable, Respiratory Function Stable and Patent Airway  Post-op Vital Signs: Reviewed and stable  Complications: No apparent anesthesia complications

## 2013-04-25 ENCOUNTER — Encounter (HOSPITAL_BASED_OUTPATIENT_CLINIC_OR_DEPARTMENT_OTHER): Payer: Self-pay | Admitting: Orthopedic Surgery

## 2013-04-25 NOTE — Op Note (Signed)
NAME:  Shirley Bush, Shirley Bush NO.:  [id number]  MEDICAL RECORD NO.:  [id number]  LOCATION:                               FACILITY:  [facility]  PHYSICIAN:  Loreta Ave, M.D. DATE OF BIRTH:  1955/08/26  DATE OF PROCEDURE:  04/24/2013 DATE OF DISCHARGE:  04/24/2013                              OPERATIVE REPORT   PREOPERATIVE DIAGNOSIS:  Left knee medial meniscus tear.  POSTOPERATIVE DIAGNOSIS:  Left knee medial meniscus tear with some grade 2 and 3 chondromalacia trochlea patellofemoral joint, lateral tibial plateau.  PROCEDURE:  Left knee exam under anesthesia, arthroscopy.  Partial medial meniscectomy.  Chondroplasty, patellofemoral joint and lateral tibial plateau.  SURGEON:  Loreta Ave, M.D.  ASSISTANT:  Domingo Cocking, PA.  ANESTHESIA:  General.  BLOOD LOSS:  Minimal.  SPECIMENS:  None.  CULTURES:  None.  COMPLICATIONS:  None.  DRESSINGS:  Soft compressive.  PROCEDURE:  The patient was brought to the operating room, placed on the operating table in a supine position.  After adequate anesthesia had been obtained, leg holder applied.  Leg prepped and draped in usual sterile fashion.  Two portals, one each medial and lateral parapatellar. Arthroscope introduced, knee distended and inspected.  Good patellar tracking.  Grade 2 on patella, grade 3 on the trochlea, all debrided. Cruciate ligaments intact.  Medial compartment looked good with complex tearing medial meniscus, middle posterior 3rd.  Taken down to a stable rim, tapered into remaining meniscus.  One fragment down underneath, but nothing found actually in the gutter.  Tapered in smoothly.  Laterally a little fraying of meniscus debrided.  Grade 2, mild grade 3 changes on the plateau debrided. Entire knee examined.  No other findings were appreciated.  Instruments were fully removed.  Portals were closed with nylon.  Portals injected with Marcaine.  Knee injected with Depo-Medrol and Marcaine.   Anesthesia reversed.  Brought to the recovery room.  Tolerated the surgery well. No complications.     Loreta Ave, M.D.     DFM/MEDQ  D:  04/24/2013  T:  04/25/2013  Job:  [id number]

## 2013-05-02 ENCOUNTER — Ambulatory Visit (HOSPITAL_COMMUNITY): Payer: Managed Care, Other (non HMO)

## 2013-05-02 ENCOUNTER — Ambulatory Visit (HOSPITAL_COMMUNITY)
Admission: RE | Admit: 2013-05-02 | Discharge: 2013-05-02 | Disposition: A | Discharge status: Home / Self Care | Payer: Managed Care, Other (non HMO) | Source: Ambulatory Visit | Attending: Family Medicine | Admitting: Family Medicine

## 2013-05-02 DIAGNOSIS — N949 Unspecified condition associated with female genital organs and menstrual cycle: Secondary | ICD-10-CM | POA: Insufficient documentation

## 2013-05-02 DIAGNOSIS — D219 Benign neoplasm of connective and other soft tissue, unspecified: Secondary | ICD-10-CM

## 2013-05-02 DIAGNOSIS — R102 Pelvic and perineal pain: Secondary | ICD-10-CM

## 2013-05-02 DIAGNOSIS — D259 Leiomyoma of uterus, unspecified: Secondary | ICD-10-CM | POA: Insufficient documentation

## 2013-05-02 DIAGNOSIS — R1900 Intra-abdominal and pelvic swelling, mass and lump, unspecified site: Secondary | ICD-10-CM

## 2013-05-08 DIAGNOSIS — M722 Plantar fascial fibromatosis: Secondary | ICD-10-CM | POA: Insufficient documentation

## 2013-05-22 ENCOUNTER — Other Ambulatory Visit: Payer: Self-pay

## 2013-11-14 ENCOUNTER — Other Ambulatory Visit: Payer: Self-pay | Admitting: Occupational Medicine

## 2013-11-14 ENCOUNTER — Ambulatory Visit: Payer: Worker's Compensation

## 2013-11-14 DIAGNOSIS — R52 Pain, unspecified: Secondary | ICD-10-CM

## 2013-12-12 ENCOUNTER — Telehealth: Payer: Self-pay | Admitting: Family Medicine

## 2013-12-12 NOTE — Telephone Encounter (Signed)
I really  Have no specific ortho surgeonI I would suggest. I recommend, since surgery is "recent" she tries to work through the situation with the surgeon, Percell Miller, otherwise , she may do her research and determine who she wants to give her the 2nd opinion, I will refer if she needs a referral sent in

## 2013-12-12 NOTE — Telephone Encounter (Signed)
Patient is aware and said Thank you

## 2014-02-13 ENCOUNTER — Ambulatory Visit: Payer: Self-pay | Admitting: Family Medicine

## 2014-02-18 ENCOUNTER — Other Ambulatory Visit (HOSPITAL_COMMUNITY)
Admission: RE | Admit: 2014-02-18 | Discharge: 2014-02-18 | Disposition: A | Discharge status: Home / Self Care | Payer: BC Managed Care – PPO | Source: Ambulatory Visit | Attending: Family Medicine | Admitting: Family Medicine

## 2014-02-18 ENCOUNTER — Ambulatory Visit (INDEPENDENT_AMBULATORY_CARE_PROVIDER_SITE_OTHER): Payer: BC Managed Care – PPO | Admitting: Family Medicine

## 2014-02-18 ENCOUNTER — Encounter: Payer: Self-pay | Admitting: Gastroenterology

## 2014-02-18 ENCOUNTER — Encounter: Payer: Self-pay | Admitting: Family Medicine

## 2014-02-18 VITALS — BP 166/98 | HR 90 | Resp 18 | Wt 148.0 lb

## 2014-02-18 DIAGNOSIS — Z124 Encounter for screening for malignant neoplasm of cervix: Secondary | ICD-10-CM

## 2014-02-18 DIAGNOSIS — Z0000 Encounter for general adult medical examination without abnormal findings: Secondary | ICD-10-CM | POA: Insufficient documentation

## 2014-02-18 DIAGNOSIS — Z01419 Encounter for gynecological examination (general) (routine) without abnormal findings: Secondary | ICD-10-CM | POA: Insufficient documentation

## 2014-02-18 DIAGNOSIS — Z1211 Encounter for screening for malignant neoplasm of colon: Secondary | ICD-10-CM

## 2014-02-18 DIAGNOSIS — Z800 Family history of malignant neoplasm of digestive organs: Secondary | ICD-10-CM

## 2014-02-18 DIAGNOSIS — I10 Essential (primary) hypertension: Secondary | ICD-10-CM

## 2014-02-18 DIAGNOSIS — Z1151 Encounter for screening for human papillomavirus (HPV): Secondary | ICD-10-CM | POA: Insufficient documentation

## 2014-02-18 DIAGNOSIS — E785 Hyperlipidemia, unspecified: Secondary | ICD-10-CM

## 2014-02-18 DIAGNOSIS — Z1239 Encounter for other screening for malignant neoplasm of breast: Secondary | ICD-10-CM

## 2014-02-18 LAB — POC HEMOCCULT BLD/STL (OFFICE/1-CARD/DIAGNOSTIC): Fecal Occult Blood, POC: NEGATIVE

## 2014-02-18 MED ORDER — METOPROLOL SUCCINATE ER 100 MG PO TB24: 100.0000 mg | ORAL_TABLET | Freq: Every day | ORAL | Status: DC

## 2014-02-18 MED ORDER — METOPROLOL TARTRATE 50 MG PO TABS: 50.0000 mg | ORAL_TABLET | Freq: Two times a day (BID) | ORAL | Status: DC

## 2014-02-18 NOTE — Assessment & Plan Note (Addendum)
UnControlled, no change in medication, pt has not been taking prescribed meds, "just got tired of meds" DASH diet and commitment to daily physical activity for a minimum of 30 minutes discussed and encouraged, as a part of hypertension management. The importance of attaining a healthy weight is also discussed.

## 2014-02-18 NOTE — Assessment & Plan Note (Signed)
Annual exam as documented. Counseling done  re healthy lifestyle involving commitment to 150 minutes exercise per week, heart healthy diet, and attaining healthy weight.The importance of adequate sleep also discussed. Regular seat belt use and safe storage  of firearms if patient has them, is also discussed. Changes in health habits are decided on by the patient with goals and time frames  set for achieving them. Immunization and cancer screening needs are specifically addressed at this visit.  

## 2014-02-18 NOTE — Progress Notes (Signed)
   Subjective:    Patient ID: Shirley Bush, female    DOB: 03/26/1956, 58 y.o.   MRN: [id number]  HPI Patient is in for annual physicaleast exam. Blood pressure is markedly elevated at this visit. She has been non compliant with her medications , just "got tired" of medication. Had knee surgery and has been off work for some time, still significant joint pain   Review of Systems See HPI     Objective:   Physical Exam Pleasant well nourished female, alert and oriented x 3, in no cardio-pulmonary distress. Afebrile. HEENT No facial trauma or asymetry. Sinuses non tender.  EOMI, PERTL,  External ears normal, tympanic membranes clear. Oropharynx moist, no exudate,fairly  good dentition. Neck: supple, no adenopathy,JVD or thyromegaly.No bruits.  Chest: Clear to ascultation bilaterally.No crackles or wheezes. Non tender to palpation  Breast: No asymetry,no masses or lumps. No tenderness. No nipple discharge or inversion. No axillary or supraclavicular adenopathy  Cardiovascular system; Heart sounds normal,  S1 and  S2 ,no S3.  No murmur, or thrill. Apical beat not displaced Peripheral pulses normal.  Abdomen: Soft, non tender, no organomegaly or masses. No bruits. Bowel sounds normal. No guarding, tenderness or rebound.  Rectal:  Normal sphincter tone. No mass.No rectal masses.  Guaiac negative stool.  GU: External genitalia normal female genitalia , female distribution of hair. No lesions. Urethral meatus normal in size, no  Prolapse, no lesions visibly  Present. Bladder non tender. Vagina pink and moist , with no visible lesions , discharge present . Adequate pelvic support no  cystocele or rectocele noted Cervix pink and appears healthy, no lesions or ulcerations noted, no discharge noted from os Uterus normal size, no adnexal masses, no cervical motion or adnexal tenderness.   Musculoskeletal exam: Full ROM of spine, hips , shoulders and knees.  No  muscle wasting or atrophy.   Neurologic: Cranial nerves 2 to 12 intact. Power, tone ,sensation and reflexes normal throughout. No disturbance in gait. No tremor.  Skin: Intact, no ulceration, erythema , scaling or rash noted. Pigmentation normal throughout  Psych; Normal mood and tearful  Affect at times. Judgement and concentration normal        Assessment & Plan:  HYPERTENSION UnControlled, no change in medication, pt has not been taking prescribed meds, "just got tired of meds" DASH diet and commitment to daily physical activity for a minimum of 30 minutes discussed and encouraged, as a part of hypertension management. The importance of attaining a healthy weight is also discussed.   Annual physical exam Annual exam as documented. Counseling done  re healthy lifestyle involving commitment to 150 minutes exercise per week, heart healthy diet, and attaining healthy weight.The importance of adequate sleep also discussed. Regular seat belt use and safe storage  of firearms if patient has them, is also discussed. Changes in health habits are decided on by the patient with goals and time frames  set for achieving them. Immunization and cancer screening needs are specifically addressed at this visit.

## 2014-02-18 NOTE — Patient Instructions (Addendum)
F/u in 3 month, call if you need me before   It is extremely important that you take blood pressure medication daily a t the same time, untreated hypertension is dangerous   Fasting cbc, lipid, chem 7 and TSH this week please  You will get appt for mammogram and are referred to Dr Oneida Alar for your colonoscopy.  I hope the left knee hold up well enough to keep you active for as long as you want to be active

## 2014-02-19 LAB — LIPID PANEL
Cholesterol: 220 mg/dL — ABNORMAL HIGH (ref 0–200)
HDL: 60 mg/dL (ref >39)
LDL Cholesterol: 138 mg/dL — ABNORMAL HIGH (ref 0–99)
Total CHOL/HDL Ratio: 3.7 Ratio
Triglycerides: 112 mg/dL (ref <150)
VLDL: 22 mg/dL (ref 0–40)

## 2014-02-19 LAB — CBC
HCT: 39.3 % (ref 36.0–46.0)
Hemoglobin: 13.7 g/dL (ref 12.0–15.0)
MCH: 30.6 pg (ref 26.0–34.0)
MCHC: 34.9 g/dL (ref 30.0–36.0)
MCV: 87.7 fL (ref 78.0–100.0)
PLATELETS: 363 10*3/uL (ref 150–400)
RBC: 4.48 MIL/uL (ref 3.87–5.11)
RDW: 13 % (ref 11.5–15.5)
WBC: 7.1 10*3/uL (ref 4.0–10.5)

## 2014-02-19 LAB — BASIC METABOLIC PANEL
BUN: 14 mg/dL (ref 6–23)
CO2: 28 meq/L (ref 19–32)
CREATININE: 0.78 mg/dL (ref 0.50–1.10)
Calcium: 10.1 mg/dL (ref 8.4–10.5)
Chloride: 102 mEq/L (ref 96–112)
Glucose, Bld: 90 mg/dL (ref 70–99)
Potassium: 4.5 mEq/L (ref 3.5–5.3)
SODIUM: 138 meq/L (ref 135–145)

## 2014-02-19 LAB — TSH: TSH: 3.727 u[IU]/mL (ref 0.350–4.500)

## 2014-02-19 LAB — CYTOLOGY - PAP

## 2014-02-20 ENCOUNTER — Encounter: Payer: Self-pay | Admitting: Gastroenterology

## 2014-02-20 ENCOUNTER — Encounter: Payer: Self-pay | Admitting: Family Medicine

## 2014-02-26 ENCOUNTER — Ambulatory Visit (HOSPITAL_COMMUNITY)
Admission: RE | Admit: 2014-02-26 | Discharge: 2014-02-26 | Disposition: A | Discharge status: Home / Self Care | Payer: BC Managed Care – PPO | Source: Ambulatory Visit | Attending: Family Medicine | Admitting: Family Medicine

## 2014-02-26 DIAGNOSIS — Z1239 Encounter for other screening for malignant neoplasm of breast: Secondary | ICD-10-CM

## 2014-02-26 DIAGNOSIS — Z1231 Encounter for screening mammogram for malignant neoplasm of breast: Secondary | ICD-10-CM | POA: Insufficient documentation

## 2014-03-02 ENCOUNTER — Telehealth: Payer: Self-pay | Admitting: *Deleted

## 2014-03-02 NOTE — Telephone Encounter (Signed)
Pt wants to schedule a TCS [phone]

## 2014-03-03 ENCOUNTER — Other Ambulatory Visit: Payer: Self-pay

## 2014-03-03 ENCOUNTER — Encounter (HOSPITAL_COMMUNITY): Payer: Self-pay | Admitting: Pharmacy Technician

## 2014-03-03 DIAGNOSIS — Z1211 Encounter for screening for malignant neoplasm of colon: Secondary | ICD-10-CM

## 2014-03-03 NOTE — Progress Notes (Signed)
Opened in error

## 2014-03-03 NOTE — Telephone Encounter (Signed)
Gastroenterology Pre-Procedure Review  Request Date: 03/06/2014 Requesting Physician: ON RECALL/ RECEIVED LETTER LAST COLONOSCOPY WAS 02/05/2009 AND NEXT IN 5 YEARS ( FAMILY HX OF COLON CANCER) PT WANTED TO BE DONE THIS WEEK DUE TO GOING BACK TO WORK NEXT WEEK  PATIENT REVIEW QUESTIONS: The patient responded to the following health history questions as indicated:    1. Diabetes Melitis: no 2. Joint replacements in the past 12 months: no 3. Major health problems in the past 3 months: no 4. Has an artificial valve or MVP: no 5. Has a defibrillator: no 6. Has been advised in past to take antibiotics in advance of a procedure like teeth cleaning: no    MEDICATIONS & ALLERGIES:    Patient reports the following regarding taking any blood thinners:   Plavix? no Aspirin? no Coumadin? no  Patient confirms/reports the following medications:  Current Outpatient Prescriptions  Medication Sig Dispense Refill  . calcium carbonate (OS-CAL) 600 MG TABS tablet Take 600 mg by mouth 2 (two) times daily with a meal.      . cholecalciferol (VITAMIN D) 1000 UNITS tablet Take 1,000 Units by mouth daily.      Marland Kitchen HYDROcodone-acetaminophen (NORCO/VICODIN) 5-325 MG per tablet Take 1 tablet by mouth every 8 (eight) hours as needed for moderate pain.      . metoprolol (LOPRESSOR) 50 MG tablet Take 1 tablet (50 mg total) by mouth 2 (two) times daily.  180 tablet  2  . Multiple Vitamins-Minerals (HAIR/SKIN/NAILS PO) Take by mouth. 3 tabs daily       . naproxen sodium (ANAPROX) 550 MG tablet Take 550 mg by mouth 2 (two) times daily with a meal. As needed      . S-Adenosylmethionine 400 MG TABS Take by mouth. As needed       No current facility-administered medications for this visit.    Patient confirms/reports the following allergies:  No Known Allergies  No orders of the defined types were placed in this encounter.    AUTHORIZATION INFORMATION Primary Insurance:   ID #:   Group #:  Pre-Cert / Auth  required:  Pre-Cert / Auth #:   Secondary Insurance:   ID #:   Group #:  Pre-Cert / Auth required: Pre-Cert / Auth #:   SCHEDULE INFORMATION: Procedure has been scheduled as follows:  Date:03/06/2014                     Time:  10:30 AM Location: [facility]  This Gastroenterology Pre-Precedure Review Form is being routed to the following provider(s): Barney Drain, MD

## 2014-03-04 MED ORDER — PEG-KCL-NACL-NASULF-NA ASC-C 100 G PO SOLR: 1.0000 | ORAL | Status: DC

## 2014-03-04 NOTE — Telephone Encounter (Signed)
MOVI PREP SPLIT DOSING, FULL LIQUIDS WITH BREAKFAST.  Full Liquid Diet A high-calorie, high-protein supplement should be used to meet your nutritional requirements when the full liquid diet is continued for more than 2 or 3 days. If this diet is to be used for an extended period of time (more than 7 days), a multivitamin should be considered.  Breads and Starches  Allowed: None are allowed except crackers WHOLE OR pureed (made into a thick, smooth soup) in soup.   Avoid: Any others.    Potatoes/Pasta/Rice  Allowed: ANY ITEM AS A SOUP OR SMALL PLATE OF MASHED POTATOES.       Vegetables  Allowed: Strained tomato or vegetable juice. Vegetables pureed in soup.   Avoid: Any others.    Fruit  Allowed: Any strained fruit juices and fruit drinks. Include 1 serving of citrus or vitamin C-enriched fruit juice daily.   Avoid: Any others.  Meat and Meat Substitutes  Allowed: Egg  Avoid: Any meat, fish, or fowl. All cheese.  Milk  Allowed: Milk beverages, including milk shakes and instant breakfast mixes. Smooth yogurt.   Avoid: Any others. Avoid dairy products if not tolerated.    Soups and Combination Foods  Allowed: Broth, strained cream soups. Strained, broth-based soups.   Avoid: Any others.    Desserts and Sweets  Allowed: flavored gelatin, tapioca, plain ice cream, sherbet, smooth pudding, junket, fruit ices, frozen ice pops, pudding pops,, frozen fudge pops, chocolate syrup. Sugar, honey, jelly, syrup.   Avoid: Any others.  Fats and Oils  Allowed: Margarine, butter, cream, sour cream, oils.   Avoid: Any others.  Beverages  Allowed: All.   Avoid: None.  Condiments  Allowed: Iodized salt, pepper, spices, flavorings. Cocoa powder.   Avoid: Any others.    SAMPLE MEAL PLAN Breakfast   cup orange juice.   2 EGGS  1 cup  milk.   1 cup beverage (coffee or tea).   Cream or sugar, if desired.    Midmorning Snack  2 SCRAMBLED OR HARD BOILED  EGG   Lunch  1 cup cream soup.    cup fruit juice.   1 cup milk.    cup custard.   1 cup beverage (coffee or tea).   Cream or sugar, if desired.    Midafternoon Snack  1 cup milk shake.  Dinner  1 cup cream soup.    cup fruit juice.   1 cup milk.    cup pudding.   1 cup beverage (coffee or tea).   Cream or sugar, if desired.  Evening Snack  1 cup supplement.  To increase calories, add sugar, cream, butter, or margarine if possible. Nutritional supplements will also increase the total calories.

## 2014-03-04 NOTE — Telephone Encounter (Signed)
Rx sent to the pharmacy and the pt will come by the office to pick up instructions.

## 2014-03-06 ENCOUNTER — Encounter (HOSPITAL_COMMUNITY): Payer: Self-pay | Admitting: *Deleted

## 2014-03-06 ENCOUNTER — Encounter (HOSPITAL_COMMUNITY)
Admission: RE | Disposition: A | Discharge status: Home / Self Care | Payer: Self-pay | Source: Ambulatory Visit | Attending: Gastroenterology

## 2014-03-06 ENCOUNTER — Ambulatory Visit (HOSPITAL_COMMUNITY)
Admission: RE | Admit: 2014-03-06 | Discharge: 2014-03-06 | Disposition: A | Discharge status: Home / Self Care | Payer: BC Managed Care – PPO | Source: Ambulatory Visit | Attending: Gastroenterology | Admitting: Gastroenterology

## 2014-03-06 DIAGNOSIS — D126 Benign neoplasm of colon, unspecified: Secondary | ICD-10-CM | POA: Diagnosis not present

## 2014-03-06 DIAGNOSIS — Z1211 Encounter for screening for malignant neoplasm of colon: Secondary | ICD-10-CM | POA: Diagnosis present

## 2014-03-06 DIAGNOSIS — K648 Other hemorrhoids: Secondary | ICD-10-CM | POA: Insufficient documentation

## 2014-03-06 DIAGNOSIS — K5730 Diverticulosis of large intestine without perforation or abscess without bleeding: Secondary | ICD-10-CM | POA: Diagnosis not present

## 2014-03-06 DIAGNOSIS — Z800 Family history of malignant neoplasm of digestive organs: Secondary | ICD-10-CM | POA: Diagnosis not present

## 2014-03-06 DIAGNOSIS — I10 Essential (primary) hypertension: Secondary | ICD-10-CM | POA: Diagnosis not present

## 2014-03-06 HISTORY — PX: COLONOSCOPY: SHX5424

## 2014-03-06 SURGERY — COLONOSCOPY
Anesthesia: Moderate Sedation

## 2014-03-06 MED ORDER — MEPERIDINE HCL 100 MG/ML IJ SOLN
INTRAMUSCULAR | Status: DC | PRN
  Administered 2014-03-06 (×4): 25 mg via INTRAVENOUS

## 2014-03-06 MED ORDER — SODIUM CHLORIDE 0.9 % IV SOLN
INTRAVENOUS | Status: DC
  Administered 2014-03-06: 10:00:00 via INTRAVENOUS

## 2014-03-06 MED ORDER — STERILE WATER FOR IRRIGATION IR SOLN
Status: DC | PRN
Start: 2014-03-06 — End: 2014-03-06
  Administered 2014-03-06: 10:00:00

## 2014-03-06 MED ORDER — MIDAZOLAM HCL 5 MG/5ML IJ SOLN
INTRAMUSCULAR | Status: DC | PRN
  Administered 2014-03-06: 2 mg via INTRAVENOUS
  Administered 2014-03-06: 1 mg via INTRAVENOUS
  Administered 2014-03-06: 2 mg via INTRAVENOUS

## 2014-03-06 MED ORDER — MIDAZOLAM HCL 5 MG/5ML IJ SOLN
INTRAMUSCULAR | Status: AC
  Filled 2014-03-06: qty 10

## 2014-03-06 MED ORDER — MEPERIDINE HCL 100 MG/ML IJ SOLN
INTRAMUSCULAR | Status: AC
  Filled 2014-03-06: qty 2

## 2014-03-06 NOTE — Discharge Instructions (Signed)
You had 2 polyps removed. You have internal hemorrhoids and diverticulosis IN YOUR EFT COLON.   FOLLOW A HIGH FIBER DIET. AVOID ITEMS THAT CAUSE BLOATING. SEE INFO BELOW.  YOUR BIOPSY RESULTS SHOULD BE BACK IN 14 DAYS.  Next colonoscopy in  5 years.   Colonoscopy Care After Read the instructions outlined below and refer to this sheet in the next week. These discharge instructions provide you with general information on caring for yourself after you leave the hospital. While your treatment has been planned according to the most current medical practices available, unavoidable complications occasionally occur. If you have any problems or questions after discharge, call DR. Callin Ashe, [phone].  ACTIVITY  You may resume your regular activity, but move at a slower pace for the next 24 hours.   Take frequent rest periods for the next 24 hours.   Walking will help get rid of the air and reduce the bloated feeling in your belly (abdomen).   No driving for 24 hours (because of the medicine (anesthesia) used during the test).   You may shower.   Do not sign any important legal documents or operate any machinery for 24 hours (because of the anesthesia used during the test).    NUTRITION  Drink plenty of fluids.   You may resume your normal diet as instructed by your doctor.   Begin with a light meal and progress to your normal diet. Heavy or fried foods are harder to digest and may make you feel sick to your stomach (nauseated).   Avoid alcoholic beverages for 24 hours or as instructed.    MEDICATIONS  You may resume your normal medications.   WHAT YOU CAN EXPECT TODAY  Some feelings of bloating in the abdomen.   Passage of more gas than usual.   Spotting of blood in your stool or on the toilet paper  .  IF YOU HAD POLYPS REMOVED DURING THE COLONOSCOPY:  Eat a soft diet IF YOU HAVE NAUSEA, BLOATING, ABDOMINAL PAIN, OR VOMITING.    FINDING OUT THE RESULTS OF YOUR  TEST Not all test results are available during your visit. DR. Oneida Alar WILL CALL YOU WITHIN 7 DAYS OF YOUR PROCEDUE WITH YOUR RESULTS. Do not assume everything is normal if you have not heard from DR. Jenna Routzahn IN ONE WEEK, CALL HER OFFICE AT [phone].  SEEK IMMEDIATE MEDICAL ATTENTION AND CALL THE OFFICE: [phone] IF:  You have more than a spotting of blood in your stool.   Your belly is swollen (abdominal distention).   You are nauseated or vomiting.   You have a temperature over 101F.   You have abdominal pain or discomfort that is severe or gets worse throughout the day.  Polyps, Colon  A polyp is extra tissue that grows inside your body. Colon polyps grow in the large intestine. The large intestine, also called the colon, is part of your digestive system. It is a long, hollow tube at the end of your digestive tract where your body makes and stores stool. Most polyps are not dangerous. They are benign. This means they are not cancerous. But over time, some types of polyps can turn into cancer. Polyps that are smaller than a pea are usually not harmful. But larger polyps could someday become or may already be cancerous. To be safe, doctors remove all polyps and test them.   WHO GETS POLYPS? Anyone can get polyps, but certain people are more likely than others. You may have a greater chance of getting  polyps if:  You are over 28.   You have had polyps before.   Someone in your family has had polyps.   Someone in your family has had cancer of the large intestine.   Find out if someone in your family has had polyps. You may also be more likely to get polyps if you:   Eat a lot of fatty foods   Smoke   Drink alcohol   Do not exercise  Eat too much   TREATMENT  The caregiver will remove the polyp during sigmoidoscopy or colonoscopy.  PREVENTION There is not one sure way to prevent polyps. You might be able to lower your risk of getting them if you:  Eat more fruits and  vegetables and less fatty food.   Do not smoke.   Avoid alcohol.   Exercise every day.   Lose weight if you are overweight.   Eating more calcium and folate can also lower your risk of getting polyps. Some foods that are rich in calcium are milk, cheese, and broccoli. Some foods that are rich in folate are chickpeas, kidney beans, and spinach.   High-Fiber Diet A high-fiber diet changes your normal diet to include more whole grains, legumes, fruits, and vegetables. Changes in the diet involve replacing refined carbohydrates with unrefined foods. The calorie level of the diet is essentially unchanged. The Dietary Reference Intake (recommended amount) for adult males is 38 grams per day. For adult females, it is 25 grams per day. Pregnant and lactating women should consume 28 grams of fiber per day. Fiber is the intact part of a plant that is not broken down during digestion. Functional fiber is fiber that has been isolated from the plant to provide a beneficial effect in the body. PURPOSE  Increase stool bulk.   Ease and regulate bowel movements.   Lower cholesterol.  INDICATIONS THAT YOU NEED MORE FIBER  Constipation and hemorrhoids.   Uncomplicated diverticulosis (intestine condition) and irritable bowel syndrome.   Weight management.   As a protective measure against hardening of the arteries (atherosclerosis), diabetes, and cancer.   GUIDELINES FOR INCREASING FIBER IN THE DIET  Start adding fiber to the diet slowly. A gradual increase of about 5 more grams (2 slices of whole-wheat bread, 2 servings of most fruits or vegetables, or 1 bowl of high-fiber cereal) per day is best. Too rapid an increase in fiber may result in constipation, flatulence, and bloating.   Drink enough water and fluids to keep your urine clear or pale yellow. Water, juice, or caffeine-free drinks are recommended. Not drinking enough fluid may cause constipation.   Eat a variety of high-fiber foods rather  than one type of fiber.   Try to increase your intake of fiber through using high-fiber foods rather than fiber pills or supplements that contain small amounts of fiber.   The goal is to change the types of food eaten. Do not supplement your present diet with high-fiber foods, but replace foods in your present diet.  INCLUDE A VARIETY OF FIBER SOURCES  Replace refined and processed grains with whole grains, canned fruits with fresh fruits, and incorporate other fiber sources. White rice, white breads, and most bakery goods contain little or no fiber.   Brown whole-grain rice, buckwheat oats, and many fruits and vegetables are all good sources of fiber. These include: broccoli, Brussels sprouts, cabbage, cauliflower, beets, sweet potatoes, white potatoes (skin on), carrots, tomatoes, eggplant, squash, berries, fresh fruits, and dried fruits.   Cereals  appear to be the richest source of fiber. Cereal fiber is found in whole grains and bran. Bran is the fiber-rich outer coat of cereal grain, which is largely removed in refining. In whole-grain cereals, the bran remains. In breakfast cereals, the largest amount of fiber is found in those with "bran" in their names. The fiber content is sometimes indicated on the label.   You may need to include additional fruits and vegetables each day.   In baking, for 1 cup white flour, you may use the following substitutions:   1 cup whole-wheat flour minus 2 tablespoons.   1/2 cup white flour plus 1/2 cup whole-wheat flour.   Diverticulosis Diverticulosis is a common condition that develops when small pouches (diverticula) form in the wall of the colon. The risk of diverticulosis increases with age. It happens more often in people who eat a low-fiber diet. Most individuals with diverticulosis have no symptoms. Those individuals with symptoms usually experience belly (abdominal) pain, constipation, or loose stools (diarrhea).  HOME CARE  INSTRUCTIONS  Increase the amount of fiber in your diet as directed by your caregiver or dietician. This may reduce symptoms of diverticulosis.   Drink at least 6 to 8 glasses of water each day to prevent constipation.   Try not to strain when you have a bowel movement.   Avoiding nuts and seeds to prevent complications is still an uncertain benefit.       FOODS HAVING HIGH FIBER CONTENT INCLUDE:  Fruits. Apple, peach, pear, tangerine, raisins, prunes.   Vegetables. Brussels sprouts, asparagus, broccoli, cabbage, carrot, cauliflower, romaine lettuce, spinach, summer squash, tomato, winter squash, zucchini.   Starchy Vegetables. Baked beans, kidney beans, lima beans, split peas, lentils, potatoes (with skin).   Grains. Whole wheat bread, brown rice, bran flake cereal, plain oatmeal, white rice, shredded wheat, bran muffins.    SEEK IMMEDIATE MEDICAL CARE IF:  You develop increasing pain or severe bloating.   You have an oral temperature above 101F.   You develop vomiting or bowel movements that are bloody or black.   Hemorrhoids Hemorrhoids are dilated (enlarged) veins around the rectum. Sometimes clots will form in the veins. This makes them swollen and painful. These are called thrombosed hemorrhoids. Causes of hemorrhoids include:  Constipation.   Straining to have a bowel movement.   HEAVY LIFTING HOME CARE INSTRUCTIONS  Eat a well balanced diet and drink 6 to 8 glasses of water every day to avoid constipation. You may also use a bulk laxative.   Avoid straining to have bowel movements.   Keep anal area dry and clean.   Do not use a donut shaped pillow or sit on the toilet for long periods. This increases blood pooling and pain.   Move your bowels when your body has the urge; this will require less straining and will decrease pain and pressure.

## 2014-03-06 NOTE — Op Note (Signed)
[facility] [address] [place], [address]   COLONOSCOPY PROCEDURE REPORT  PATIENT: Shirley Bush, Shirley Bush  MR#: [id number] BIRTHDATE: 1956/05/23 , 51  yrs. old GENDER: Female ENDOSCOPIST: Barney Drain, MD REFERRED YK:DXIPJASN Moshe Cipro, M.D. PROCEDURE DATE:  03/06/2014 PROCEDURE:   Colonoscopy with snare polypectomy and Colonoscopy with cold biopsy polypectomy INDICATIONS:Patient's immediate family history of colon cancer. MEDICATIONS: Demerol 100 mg IV and Versed 5 mg IV  DESCRIPTION OF PROCEDURE:    Physical exam was performed.  Informed consent was obtained from the patient after explaining the benefits, risks, and alternatives to procedure.  The patient was connected to monitor and placed in left lateral position. Continuous oxygen was provided by nasal cannula and IV medicine administered through an indwelling cannula.  After administration of sedation and rectal exam, the patients rectum was intubated and the EC-3890Li ([id number])  colonoscope was advanced under direct visualization to the cecum.  The scope was removed slowly by carefully examining the color, texture, anatomy, and integrity mucosa on the way out.  The patient was recovered in endoscopy and discharged home in satisfactory condition.    COLON FINDINGS: A sessile polyp measuring 2 mm in size was found at the cecum.  A polypectomy was performed with cold forceps.  , A sessile polyp measuring 6 mm in size was found in the sigmoid colon.  A polypectomy was performed using snare cautery.  , There was moderate diverticulosis noted in the sigmoid colon with associated muscular hypertrophy.  , and Moderate sized internal hemorrhoids were found.  PREP QUALITY: good.  CECAL W/D TIME: 17 minutes     COMPLICATIONS: None  ENDOSCOPIC IMPRESSION: 1.   Two colon polyps removed 2.   Moderate diverticulosis in the sigmoid colon 3.   Moderate sized internal hemorrhoids  RECOMMENDATIONS: FOLLOW A HIGH  FIBER DIET.  AVOID ITEMS THAT CAUSE BLOATING. BIOPSY RESULTS SHOULD BE BACK IN 14 DAYS.  Next colonoscopy in 5 years.       _______________________________ Lorrin MaisBarney Drain, MD 03/06/2014 4:36 PM

## 2014-03-06 NOTE — H&P (Signed)
Primary Care Physician:  Tula Nakayama, MD Primary Gastroenterologist:  Dr. Oneida Alar  Pre-Procedure History & Physical: HPI:  Shirley Bush is a 58 y.o. female here for [place].  Past Medical History  Diagnosis Date  . Migraines   . Fibromyalgia   . Medial meniscus tear 04/2013    left  . Hypertension     under control with meds., has been on med. x 4 yr.    Past Surgical History  Procedure Laterality Date  . Tubal ligation  1992  . Cesarean section  1978  . Cesarean section  1980  . Cesarean section  1992  . Umbilical hernia repair  2008  . Shoulder arthroscopy Left 2009  . Shoulder arthroscopy with rotator cuff repair Left 11/25/2004  . Colonoscopy  02/05/2009  . Shoulder arthroscopy Right   . Finger surgery Right     long finger  . Knee arthroscopy with medial menisectomy Left 04/24/2013    Procedure: LEFT KNEE ARTHROSCOPY CHONDROPLASTY WITH MEDIAL MENISECTOMY;  Surgeon: Ninetta Lights, MD;  Location: [facility];  Service: Orthopedics;  Laterality: Left;    Prior to Admission medications   Medication Sig Start Date End Date Taking? Authorizing Provider  calcium carbonate (OS-CAL) 600 MG TABS tablet Take 600 mg by mouth 2 (two) times daily with a meal.   Yes Historical Provider, MD  cholecalciferol (VITAMIN D) 1000 UNITS tablet Take 1,000 Units by mouth daily.   Yes Historical Provider, MD  metoprolol (LOPRESSOR) 50 MG tablet Take 1 tablet (50 mg total) by mouth 2 (two) times daily. 02/18/14  Yes Fayrene Helper, MD  Multiple Vitamins-Minerals (HAIR/SKIN/NAILS PO) Take 3 tablets by mouth daily. 3 tabs daily   Yes Historical Provider, MD  naproxen sodium (ANAPROX) 550 MG tablet Take 550 mg by mouth 2 (two) times daily with a meal. As needed(pain)   Yes Historical Provider, MD  S-Adenosylmethionine 400 MG TABS Take 400 mg by mouth as needed. As needed   Yes Historical Provider, MD  HYDROcodone-acetaminophen (NORCO/VICODIN) 5-325 MG per  tablet Take 1 tablet by mouth every 8 (eight) hours as needed for moderate pain.    Historical Provider, MD    Allergies as of 03/03/2014  . (No Known Allergies)    Family History  Problem Relation Age of Onset  . Hypertension Mother   . Hypertension Father   . Alcohol abuse Father   . Seizures Father   . Migraines Sister   . Colon cancer Sister   . Thyroid disease Sister   . Diabetes Sister   . Cancer Sister     History   Social History  . Marital Status: Divorced    Spouse Name: N/A    Number of Children: 3  . Years of Education: N/A   Occupational History  . employed     Social History Main Topics  . Smoking status: Never Smoker   . Smokeless tobacco: Never Used  . Alcohol Use: Yes     Comment: occasionally  . Drug Use: No  . Sexual Activity: No   Other Topics Concern  . Not on file   Social History Narrative  . No narrative on file    Review of Systems: See HPI, otherwise negative ROS   Physical Exam: BP 135/85  Pulse 90  Temp(Src) 97.4 F (36.3 C) (Oral)  Resp 20  Ht 5\' 3"  (1.6 m)  Wt 145 lb (65.772 kg)  BMI 25.69 kg/m2  SpO2 98% General:  Alert,  pleasant and cooperative in NAD Head:  Normocephalic and atraumatic. Neck:  Supple; Lungs:  Clear throughout to auscultation.    Heart:  Regular rate and rhythm. Abdomen:  Soft, nontender and nondistended. Normal bowel sounds, without guarding, and without rebound.   Neurologic:  Alert and  oriented x4;  grossly normal neurologically.  Impression/Plan:    SCREENING  Plan:  1. TCS TODAY

## 2014-03-11 ENCOUNTER — Encounter (HOSPITAL_COMMUNITY): Payer: Self-pay | Admitting: Gastroenterology

## 2014-03-18 ENCOUNTER — Telehealth: Payer: Self-pay | Admitting: Gastroenterology

## 2014-03-18 NOTE — Telephone Encounter (Signed)
Please call pt. She had a simple adenoma removed from her colon.    FOLLOW A HIGH FIBER DIET. AVOID ITEMS THAT CAUSE BLOATING.   Next colonoscopy in  5 years.

## 2014-03-19 NOTE — Telephone Encounter (Signed)
Called and informed pt.  

## 2014-03-19 NOTE — Telephone Encounter (Signed)
Reminder in epic °

## 2014-03-20 ENCOUNTER — Ambulatory Visit: Payer: Self-pay | Admitting: Gastroenterology

## 2014-05-18 ENCOUNTER — Encounter (HOSPITAL_COMMUNITY): Payer: Self-pay | Admitting: Gastroenterology

## 2014-05-19 ENCOUNTER — Other Ambulatory Visit: Payer: Self-pay | Admitting: Physician Assistant

## 2014-05-19 ENCOUNTER — Telehealth: Payer: Self-pay

## 2014-05-19 NOTE — H&P (Signed)
TOTAL KNEE ADMISSION H&P  Patient is being admitted for left total knee arthroplasty.  Subjective:  Chief Complaint:left knee pain.  HPI: Shirley Bush, 58 y.o. female, has a history of pain and functional disability in the left knee due to arthritis and has failed non-surgical conservative treatments for greater than 12 weeks to includeNSAID's and/or analgesics, corticosteriod injections, viscosupplementation injections and activity modification.  Onset of symptoms was gradual, starting 2 years ago with rapidlly worsening course since that time. The patient noted prior procedures on the knee to include  arthroscopy and menisectomy on the left knee(s).  Patient currently rates pain in the left knee(s) at 8 out of 10 with activity. Patient has night pain, worsening of pain with activity and weight bearing, pain that interferes with activities of daily living and joint swelling.  Patient has evidence of subchondral cysts, subchondral sclerosis, periarticular osteophytes and joint space narrowing by imaging studies. There is no active infection.  Patient Active Problem List   Diagnosis Date Noted  . Annual physical exam 02/18/2014  . Left knee pain 02/07/2013  . Back pain with radiation 02/07/2013  . Fibromyalgia 04/05/2012  . Onychomycosis 08/25/2011  . Other and unspecified hyperlipidemia 02/11/2011  . UNSPECIFIED MYALGIA AND MYOSITIS 02/10/2010  . CARPAL TUNNEL SYNDROME, LEFT 10/05/2008  . CHEST PAIN 05/04/2008  . FIBROIDS, UTERUS 03/31/2008  . DEPRESSIVE DISORDER NOT ELSEWHERE CLASSIFIED 02/14/2008  . NECK AND BACK PAIN 02/14/2008  . MIGRAINE HEADACHE 10/18/2007  . HYPERTENSION 10/18/2007   Past Medical History  Diagnosis Date  . Migraines   . Fibromyalgia   . Medial meniscus tear 04/2013    left  . Hypertension     under control with meds., has been on med. x 4 yr.    Past Surgical History  Procedure Laterality Date  . Tubal ligation  1992  . Cesarean section  1978  .  Cesarean section  1980  . Cesarean section  1992  . Umbilical hernia repair  2008  . Shoulder arthroscopy Left 2009  . Shoulder arthroscopy with rotator cuff repair Left 11/25/2004  . Colonoscopy  02/05/2009  . Shoulder arthroscopy Right   . Finger surgery Right     long finger  . Knee arthroscopy with medial menisectomy Left 04/24/2013    Procedure: LEFT KNEE ARTHROSCOPY CHONDROPLASTY WITH MEDIAL MENISECTOMY;  Surgeon: Ninetta Lights, MD;  Location: [facility];  Service: Orthopedics;  Laterality: Left;  . Colonoscopy N/A 03/06/2014    Procedure: COLONOSCOPY;  Surgeon: Danie Binder, MD;  [facility];  Service: Endoscopy;  Laterality: N/A;  10:30 AM     (Not in a hospital admission) No Known Allergies  History  Substance Use Topics  . Smoking status: Never Smoker   . Smokeless tobacco: Never Used  . Alcohol Use: Yes     Comment: occasionally    Family History  Problem Relation Age of Onset  . Hypertension Mother   . Hypertension Father   . Alcohol abuse Father   . Seizures Father   . Migraines Sister   . Colon cancer Sister   . Thyroid disease Sister   . Diabetes Sister   . Cancer Sister      Review of Systems  Constitutional: Negative.   HENT: Negative.   Eyes: Negative.   Respiratory: Negative.   Cardiovascular: Negative.   Gastrointestinal: Negative.   Genitourinary: Negative.   Musculoskeletal: Positive for joint pain.  Skin: Negative.   Neurological: Negative.   Endo/Heme/Allergies: Negative.  Psychiatric/Behavioral: Negative.     Objective:  Physical Exam  Constitutional: She is oriented to person, place, and time. She appears well-developed and well-nourished.  HENT:  Head: Normocephalic and atraumatic.  Eyes: EOM are normal. Pupils are equal, round, and reactive to light.  Neck: Normal range of motion. Neck supple.  Cardiovascular: Normal rate, regular rhythm and normal heart sounds.  Exam reveals no gallop and no friction  rub.   No murmur heard. Respiratory: No respiratory distress. She has no wheezes. She has no rales.  GI: Soft. Bowel sounds are normal.  Musculoskeletal:  Antalgic gait varus thrust on the left. Left knee has 5 degrees of varus which is partially correctable. Motion is 0 to a little better than 90. Tibiofemoral and patellofemoral crepitus. She is neurovascularly intact distally.  Neurological: She is alert and oriented to person, place, and time.  Skin: Skin is warm and dry.  Psychiatric: She has a normal mood and affect. Her behavior is normal. Judgment and thought content normal.    Vital signs in last 24 hours: @VSRANGES @  Labs:   Estimated body mass index is 25.69 kg/(m^2) as calculated from the following:   Height as of 03/06/14: 5\' 3"  (1.6 m).   Weight as of 03/06/14: 65.772 kg (145 lb).   Imaging Review Plain radiographs demonstrate severe degenerative joint disease of the left knee(s). The overall alignment ismild varus. The bone quality appears to be fair for age and reported activity level.  Assessment/Plan:  End stage arthritis, left knee   The patient history, physical examination, clinical judgment of the provider and imaging studies are consistent with end stage degenerative joint disease of the left knee(s) and total knee arthroplasty is deemed medically necessary. The treatment options including medical management, injection therapy arthroscopy and arthroplasty were discussed at length. The risks and benefits of total knee arthroplasty were presented and reviewed. The risks due to aseptic loosening, infection, stiffness, patella tracking problems, thromboembolic complications and other imponderables were discussed. The patient acknowledged the explanation, agreed to proceed with the plan and consent was signed. Patient is being admitted for inpatient treatment for surgery, pain control, PT, OT, prophylactic antibiotics, VTE prophylaxis, progressive ambulation and ADL's and  discharge planning. The patient is planning to be discharged home with home health services

## 2014-05-19 NOTE — Telephone Encounter (Signed)
She needs an OV was high at last visit , needs to come in  Send in pls lisinopril/hctz 20/25 one daily #30 only , and let her know She needs to make and keep appt in next 3 weeks for evaluation and medical management Remove metoprolol

## 2014-05-20 MED ORDER — LISINOPRIL-HYDROCHLOROTHIAZIDE 20-25 MG PO TABS: 1.0000 | ORAL_TABLET | Freq: Every day | ORAL | Status: DC

## 2014-05-20 NOTE — Telephone Encounter (Signed)
Spoke with patient and she is aware that she needs to have appointment.  Given appointment for 11-17.  Med sent to pharmacy.

## 2014-05-20 NOTE — Addendum Note (Signed)
Addended by: Denman George B on: 05/20/2014 08:43 AM   Modules accepted: Orders

## 2014-05-26 ENCOUNTER — Inpatient Hospital Stay (HOSPITAL_COMMUNITY)
Admission: RE | Admit: 2014-05-26 | Discharge: 2014-05-26 | Disposition: A | Discharge status: Home / Self Care | Payer: Self-pay | Source: Ambulatory Visit

## 2014-06-02 ENCOUNTER — Ambulatory Visit (INDEPENDENT_AMBULATORY_CARE_PROVIDER_SITE_OTHER): Payer: Worker's Compensation | Admitting: Family Medicine

## 2014-06-02 ENCOUNTER — Encounter: Payer: Self-pay | Admitting: Family Medicine

## 2014-06-02 VITALS — BP 130/80 | HR 76 | Resp 18 | Ht 63.0 in | Wt 152.1 lb

## 2014-06-02 DIAGNOSIS — I10 Essential (primary) hypertension: Secondary | ICD-10-CM

## 2014-06-02 DIAGNOSIS — M25562 Pain in left knee: Secondary | ICD-10-CM

## 2014-06-02 DIAGNOSIS — E785 Hyperlipidemia, unspecified: Secondary | ICD-10-CM

## 2014-06-02 DIAGNOSIS — J302 Other seasonal allergic rhinitis: Secondary | ICD-10-CM

## 2014-06-02 DIAGNOSIS — G43109 Migraine with aura, not intractable, without status migrainosus: Secondary | ICD-10-CM

## 2014-06-02 MED ORDER — RANITIDINE HCL 150 MG PO CAPS: 150.0000 mg | ORAL_CAPSULE | Freq: Two times a day (BID) | ORAL | Status: DC

## 2014-06-02 NOTE — Progress Notes (Signed)
   Subjective:    Patient ID: Shirley Bush, female    DOB: 1956/02/06, 58 y.o.   MRN: [id number]  HPI The PT is here for follow up and re-evaluation of chronic medical conditions, medication management and review of any available recent lab and radiology data. Needs bP re evaluated ans she plans to have knee surgery in the near future and this needs to be controlled Preventive health is updated, specifically  Cancer screening and Immunization.   Questions or concerns regarding consultations or procedures which the PT has had in the interim are  addressed. The PT denies any adverse reactions to current medications since the last visit.         Review of Systems See HPI Denies recent fever or chills. Denies sinus pressure, nasal congestion, ear pain or sore throat. Denies chest congestion, productive cough or wheezing. Denies chest pains, palpitations and leg swelling Denies abdominal pain, nausea, vomiting,diarrhea or constipation.   Denies dysuria, frequency, hesitancy or incontinence.  Denies headaches, seizures, numbness, or tingling. Denies uncontrolled  depression, anxiety or insomnia. Denies skin break down or rash.        Objective:   Physical Exam BP 130/80 mmHg  Pulse 76  Resp 18  Ht 5\' 3"  (1.6 m)  Wt 152 lb 1.9 oz (69.001 kg)  BMI 26.95 kg/m2  SpO2 98% Patient alert and oriented and in no cardiopulmonary distress.  HEENT: No facial asymmetry, EOMI,   oropharynx pink and moist.  Neck supple no JVD, no mass.  Chest: Clear to auscultation bilaterally.  CVS: S1, S2 no murmurs, no S3.Regular rate.  ABD: Soft non tender.   Ext: No edema  MS: Adequate ROM spine, shoulders, hips and reduced in  knees.  Skin: Intact, no ulcerations or rash noted.  Psych: Good eye contact, normal affect. Memory intact not anxious or depressed appearing.  CNS: CN 2-12 intact, power,  normal throughout.no focal deficits noted.        Assessment & Plan:  Essential  hypertension Controlled, no change in medication DASH diet and commitment to daily physical activity for a minimum of 30 minutes discussed and encouraged, as a part of hypertension management. The importance of attaining a healthy weight is also discussed.   Hyperlipemia deteriorated and uncontrolled Hyperlipidemia:Low fat diet discussed and encouraged.  \  Migraine Less frequent and severe, responds well to fioricet , continue same  Allergic rhinitis Controlled, no change in medication   Left knee pain Worsened, surgery planned, pain management is by orthopedics

## 2014-06-02 NOTE — Patient Instructions (Addendum)
F/u in 3.5 month, call if you need me before  Excellent blood pressure no med change  Work on low fat diet so cholesterol improves  Fasting lipid and cmp in 3.5 month  Info on low fat diet ,  will be provided today  Zantac twice daily has been sent for heartburn

## 2014-06-03 ENCOUNTER — Encounter (HOSPITAL_COMMUNITY): Admission: RE | Payer: Self-pay | Source: Ambulatory Visit

## 2014-06-03 ENCOUNTER — Inpatient Hospital Stay (HOSPITAL_COMMUNITY)
Admission: RE | Admit: 2014-06-03 | Payer: Worker's Compensation | Source: Ambulatory Visit | Admitting: Orthopedic Surgery

## 2014-06-03 SURGERY — ARTHROPLASTY, KNEE, TOTAL
Anesthesia: General | Site: Knee | Laterality: Left

## 2014-06-05 ENCOUNTER — Other Ambulatory Visit: Payer: Self-pay | Admitting: Family Medicine

## 2014-06-19 ENCOUNTER — Ambulatory Visit: Payer: BC Managed Care – PPO | Admitting: Family Medicine

## 2014-07-19 DIAGNOSIS — J309 Allergic rhinitis, unspecified: Secondary | ICD-10-CM | POA: Insufficient documentation

## 2014-07-19 NOTE — Assessment & Plan Note (Signed)
Worsened, surgery planned, pain management is by orthopedics

## 2014-07-19 NOTE — Assessment & Plan Note (Signed)
Less frequent and severe, responds well to fioricet , continue same

## 2014-07-19 NOTE — Assessment & Plan Note (Signed)
Controlled, no change in medication DASH diet and commitment to daily physical activity for a minimum of 30 minutes discussed and encouraged, as a part of hypertension management. The importance of attaining a healthy weight is also discussed.  

## 2014-07-19 NOTE — Assessment & Plan Note (Signed)
Controlled, no change in medication  

## 2014-07-19 NOTE — Assessment & Plan Note (Signed)
deteriorated and uncontrolled Hyperlipidemia:Low fat diet discussed and encouraged.  \

## 2014-08-26 ENCOUNTER — Other Ambulatory Visit: Payer: Self-pay | Admitting: Family Medicine

## 2014-08-28 ENCOUNTER — Other Ambulatory Visit: Payer: Self-pay

## 2014-08-28 ENCOUNTER — Other Ambulatory Visit: Payer: Self-pay | Admitting: Family Medicine

## 2014-08-28 MED ORDER — LISINOPRIL-HYDROCHLOROTHIAZIDE 20-25 MG PO TABS: 1.0000 | ORAL_TABLET | Freq: Every day | ORAL | Status: DC

## 2014-09-25 LAB — LIPID PANEL
Cholesterol: 198 mg/dL (ref 0–200)
HDL: 49 mg/dL (ref >=46)
LDL Cholesterol: 125 mg/dL — ABNORMAL HIGH (ref 0–99)
Total CHOL/HDL Ratio: 4 Ratio
Triglycerides: 120 mg/dL (ref <150)
VLDL: 24 mg/dL (ref 0–40)

## 2014-09-25 LAB — COMPREHENSIVE METABOLIC PANEL
ALBUMIN: 4.3 g/dL (ref 3.5–5.2)
ALT: 14 U/L (ref 0–35)
AST: 19 U/L (ref 0–37)
Alkaline Phosphatase: 59 U/L (ref 39–117)
BUN: 13 mg/dL (ref 6–23)
CO2: 28 meq/L (ref 19–32)
CREATININE: 0.83 mg/dL (ref 0.50–1.10)
Calcium: 9.7 mg/dL (ref 8.4–10.5)
Chloride: 102 mEq/L (ref 96–112)
Glucose, Bld: 94 mg/dL (ref 70–99)
Potassium: 4 mEq/L (ref 3.5–5.3)
Sodium: 138 mEq/L (ref 135–145)
TOTAL PROTEIN: 6.9 g/dL (ref 6.0–8.3)
Total Bilirubin: 0.5 mg/dL (ref 0.2–1.2)

## 2014-10-20 ENCOUNTER — Encounter: Payer: Self-pay | Admitting: Family Medicine

## 2014-10-20 ENCOUNTER — Ambulatory Visit (HOSPITAL_COMMUNITY)
Admission: RE | Admit: 2014-10-20 | Discharge: 2014-10-20 | Disposition: A | Discharge status: Home / Self Care | Payer: Worker's Compensation | Source: Ambulatory Visit | Attending: Family Medicine | Admitting: Family Medicine

## 2014-10-20 ENCOUNTER — Ambulatory Visit (INDEPENDENT_AMBULATORY_CARE_PROVIDER_SITE_OTHER): Payer: Worker's Compensation | Admitting: Family Medicine

## 2014-10-20 VITALS — BP 124/70 | HR 88 | Resp 18 | Ht 63.5 in | Wt 156.1 lb

## 2014-10-20 DIAGNOSIS — Z01818 Encounter for other preprocedural examination: Secondary | ICD-10-CM

## 2014-10-20 DIAGNOSIS — I10 Essential (primary) hypertension: Secondary | ICD-10-CM

## 2014-10-20 DIAGNOSIS — E785 Hyperlipidemia, unspecified: Secondary | ICD-10-CM

## 2014-10-20 DIAGNOSIS — J302 Other seasonal allergic rhinitis: Secondary | ICD-10-CM | POA: Diagnosis not present

## 2014-10-20 LAB — POCT URINALYSIS DIPSTICK
BILIRUBIN UA: NEGATIVE
Blood, UA: NEGATIVE
Glucose, UA: NEGATIVE
KETONES UA: NEGATIVE
Leukocytes, UA: NEGATIVE
Nitrite, UA: NEGATIVE
PH UA: 6
Protein, UA: NEGATIVE
Spec Grav, UA: 1.015
Urobilinogen, UA: 0.2

## 2014-10-20 LAB — CBC
HEMATOCRIT: 39.5 % (ref 36.0–46.0)
Hemoglobin: 13.6 g/dL (ref 12.0–15.0)
MCH: 31 pg (ref 26.0–34.0)
MCHC: 34.4 g/dL (ref 30.0–36.0)
MCV: 90 fL (ref 78.0–100.0)
MPV: 9.7 fL (ref 8.6–12.4)
Platelets: 377 10*3/uL (ref 150–400)
RBC: 4.39 MIL/uL (ref 3.87–5.11)
RDW: 13.3 % (ref 11.5–15.5)
WBC: 10.6 10*3/uL — AB (ref 4.0–10.5)

## 2014-10-20 MED ORDER — LISINOPRIL-HYDROCHLOROTHIAZIDE 20-25 MG PO TABS: 1.0000 | ORAL_TABLET | Freq: Every day | ORAL | Status: DC

## 2014-10-20 MED ORDER — MONTELUKAST SODIUM 10 MG PO TABS: 10.0000 mg | ORAL_TABLET | Freq: Every day | ORAL | Status: DC

## 2014-10-20 MED ORDER — CYCLOBENZAPRINE HCL 10 MG PO TABS: 10.0000 mg | ORAL_TABLET | Freq: Every day | ORAL | Status: DC

## 2014-10-20 NOTE — Patient Instructions (Signed)
F/u in 6 month, call if you need me before  CXR and CBC today  Everything looks very good , all the best with left knee replacement on 11/11/2014  I will be in touch with Dr Percell Miller  Thanks for choosing [facility], we consider it a privelige to serve you.  New is daily singulair

## 2014-10-27 ENCOUNTER — Other Ambulatory Visit: Payer: Self-pay | Admitting: Physician Assistant

## 2014-10-27 NOTE — H&P (Signed)
TOTAL KNEE ADMISSION H&P  Patient is being admitted for right total knee arthroplasty.  Subjective:  Chief Complaint:left knee pain.  HPI: Shirley Bush, 59 y.o. female, has a history of pain and functional disability in the left knee due to arthritis and has failed non-surgical conservative treatments for greater than 12 weeks to includeNSAID's and/or analgesics, corticosteriod injections and viscosupplementation injections.  Onset of symptoms was gradual, starting 2 years ago with rapidlly worsening course since that time. The patient noted prior procedures on the knee to include  arthroscopy and menisectomy on the left knee(s).  Patient currently rates pain in the left knee(s) at 8 out of 10 with activity. Patient has night pain, worsening of pain with activity and weight bearing, pain that interferes with activities of daily living, pain with passive range of motion, crepitus and joint swelling.  Patient has evidence of subchondral sclerosis and joint space narrowing by imaging studies. There is no active infection.  Patient Active Problem List   Diagnosis Date Noted  . Allergic rhinitis 07/19/2014  . Annual physical exam 02/18/2014  . Left knee pain 02/07/2013  . Back pain with radiation 02/07/2013  . Fibromyalgia 04/05/2012  . Onychomycosis 08/25/2011  . Hyperlipemia 02/11/2011  . UNSPECIFIED MYALGIA AND MYOSITIS 02/10/2010  . CARPAL TUNNEL SYNDROME, LEFT 10/05/2008  . CHEST PAIN 05/04/2008  . FIBROIDS, UTERUS 03/31/2008  . DEPRESSIVE DISORDER NOT ELSEWHERE CLASSIFIED 02/14/2008  . NECK AND BACK PAIN 02/14/2008  . Migraine 10/18/2007  . Essential hypertension 10/18/2007   Past Medical History  Diagnosis Date  . Migraines   . Fibromyalgia   . Medial meniscus tear 04/2013    left  . Hypertension     under control with meds., has been on med. x 4 yr.    Past Surgical History  Procedure Laterality Date  . Tubal ligation  1992  . Cesarean section  1978  . Cesarean section   1980  . Cesarean section  1992  . Umbilical hernia repair  2008  . Shoulder arthroscopy Left 2009  . Shoulder arthroscopy with rotator cuff repair Left 11/25/2004  . Colonoscopy  02/05/2009  . Shoulder arthroscopy Right   . Finger surgery Right     long finger  . Knee arthroscopy with medial menisectomy Left 04/24/2013    Procedure: LEFT KNEE ARTHROSCOPY CHONDROPLASTY WITH MEDIAL MENISECTOMY;  Surgeon: Ninetta Lights, MD;  Location: [place];  Service: Orthopedics;  Laterality: Left;  . Colonoscopy N/A 03/06/2014    Procedure: COLONOSCOPY;  Surgeon: Danie Binder, MD;  Location[facility];  Service: Endoscopy;  Laterality: N/A;  10:30 AM     (Not in a hospital admission) No Known Allergies  History  Substance Use Topics  . Smoking status: Never Smoker   . Smokeless tobacco: Never Used  . Alcohol Use: Yes     Comment: occasionally    Family History  Problem Relation Age of Onset  . Hypertension Mother   . Hypertension Father   . Alcohol abuse Father   . Seizures Father   . Migraines Sister   . Colon cancer Sister   . Thyroid disease Sister   . Diabetes Sister   . Cancer Sister      Review of Systems  Constitutional: Negative.   HENT: Negative.   Eyes: Negative.   Respiratory: Negative.   Cardiovascular: Negative.   Gastrointestinal: Negative.   Genitourinary: Negative.   Musculoskeletal: Positive for joint pain.  Skin: Negative.   Neurological: Negative.  Endo/Heme/Allergies: Negative.   Psychiatric/Behavioral: Negative.     Objective:  Physical Exam  Constitutional: She is oriented to person, place, and time. She appears well-developed and well-nourished.  HENT:  Head: Normocephalic and atraumatic.  Eyes: EOM are normal. Pupils are equal, round, and reactive to light.  Neck: Normal range of motion. Neck supple.  Cardiovascular: Normal rate, regular rhythm and normal heart sounds.   Respiratory: Effort normal and breath sounds normal.  No respiratory distress. She has no wheezes. She has no rales.  GI: Soft. Bowel sounds are normal.  Musculoskeletal:  Alert and oriented x 3.  Examination of her left knee reveals trace effusion.  Range of motion 0-90 degrees.  Marked patellofemoral crepitus.  She is neurovascularly intact distally.    Neurological: She is alert and oriented to person, place, and time.  Skin: Skin is warm and dry.  Psychiatric: She has a normal mood and affect. Her behavior is normal. Judgment and thought content normal.    Vital signs in last 24 hours: @VSRANGES @  Labs:   Estimated body mass index is 27.22 kg/(m^2) as calculated from the following:   Height as of 10/20/14: 5' 3.5" (1.613 m).   Weight as of 10/20/14: 70.816 kg (156 lb 1.9 oz).   Imaging Review Plain radiographs demonstrate severe degenerative joint disease of the left knee(s). The overall alignment ismild varus. The bone quality appears to be fair for age and reported activity level.  Assessment/Plan:  End stage arthritis, left knee   The patient history, physical examination, clinical judgment of the provider and imaging studies are consistent with end stage degenerative joint disease of the left knee(s) and total knee arthroplasty is deemed medically necessary. The treatment options including medical management, injection therapy arthroscopy and arthroplasty were discussed at length. The risks and benefits of total knee arthroplasty were presented and reviewed. The risks due to aseptic loosening, infection, stiffness, patella tracking problems, thromboembolic complications and other imponderables were discussed. The patient acknowledged the explanation, agreed to proceed with the plan and consent was signed. Patient is being admitted for inpatient treatment for surgery, pain control, PT, OT, prophylactic antibiotics, VTE prophylaxis, progressive ambulation and ADL's and discharge planning. The patient is planning to be discharged to skilled  nursing facility

## 2014-10-30 ENCOUNTER — Encounter (HOSPITAL_COMMUNITY): Payer: Self-pay

## 2014-10-30 ENCOUNTER — Encounter (HOSPITAL_COMMUNITY)
Admission: RE | Admit: 2014-10-30 | Discharge: 2014-10-30 | Disposition: A | Discharge status: Home / Self Care | Payer: Worker's Compensation | Source: Ambulatory Visit | Attending: Orthopedic Surgery | Admitting: Orthopedic Surgery

## 2014-10-30 DIAGNOSIS — Z01810 Encounter for preprocedural cardiovascular examination: Secondary | ICD-10-CM | POA: Insufficient documentation

## 2014-10-30 DIAGNOSIS — Z01812 Encounter for preprocedural laboratory examination: Secondary | ICD-10-CM | POA: Insufficient documentation

## 2014-10-30 LAB — COMPREHENSIVE METABOLIC PANEL
ALK PHOS: 68 U/L (ref 39–117)
ALT: 15 U/L (ref 0–35)
AST: 20 U/L (ref 0–37)
Albumin: 4.2 g/dL (ref 3.5–5.2)
Anion gap: 9 (ref 5–15)
BILIRUBIN TOTAL: 0.4 mg/dL (ref 0.3–1.2)
BUN: 13 mg/dL (ref 6–23)
CHLORIDE: 102 mmol/L (ref 96–112)
CO2: 27 mmol/L (ref 19–32)
Calcium: 10.2 mg/dL (ref 8.4–10.5)
Creatinine, Ser: 0.8 mg/dL (ref 0.50–1.10)
GFR calc non Af Amer: 81 mL/min — ABNORMAL LOW (ref >90)
Glucose, Bld: 108 mg/dL — ABNORMAL HIGH (ref 70–99)
Potassium: 3.7 mmol/L (ref 3.5–5.1)
SODIUM: 138 mmol/L (ref 135–145)
Total Protein: 7.4 g/dL (ref 6.0–8.3)

## 2014-10-30 LAB — CBC WITH DIFFERENTIAL/PLATELET
BASOS ABS: 0 10*3/uL (ref 0.0–0.1)
Basophils Relative: 0 % (ref 0–1)
Eosinophils Absolute: 0.1 10*3/uL (ref 0.0–0.7)
Eosinophils Relative: 1 % (ref 0–5)
HCT: 38.9 % (ref 36.0–46.0)
Hemoglobin: 13.6 g/dL (ref 12.0–15.0)
LYMPHS ABS: 2.3 10*3/uL (ref 0.7–4.0)
LYMPHS PCT: 33 % (ref 12–46)
MCH: 31.1 pg (ref 26.0–34.0)
MCHC: 35 g/dL (ref 30.0–36.0)
MCV: 88.8 fL (ref 78.0–100.0)
Monocytes Absolute: 0.4 10*3/uL (ref 0.1–1.0)
Monocytes Relative: 6 % (ref 3–12)
NEUTROS ABS: 4.2 10*3/uL (ref 1.7–7.7)
NEUTROS PCT: 60 % (ref 43–77)
PLATELETS: 320 10*3/uL (ref 150–400)
RBC: 4.38 MIL/uL (ref 3.87–5.11)
RDW: 11.8 % (ref 11.5–15.5)
WBC: 7 10*3/uL (ref 4.0–10.5)

## 2014-10-30 LAB — TYPE AND SCREEN
ABO/RH(D): A POS
ANTIBODY SCREEN: NEGATIVE

## 2014-10-30 LAB — URINALYSIS, ROUTINE W REFLEX MICROSCOPIC
Bilirubin Urine: NEGATIVE
GLUCOSE, UA: NEGATIVE mg/dL
HGB URINE DIPSTICK: NEGATIVE
KETONES UR: NEGATIVE mg/dL
Leukocytes, UA: NEGATIVE
Nitrite: NEGATIVE
PH: 7 (ref 5.0–8.0)
PROTEIN: NEGATIVE mg/dL
Specific Gravity, Urine: 1.008 (ref 1.005–1.030)
Urobilinogen, UA: 0.2 mg/dL (ref 0.0–1.0)

## 2014-10-30 LAB — SURGICAL PCR SCREEN
MRSA, PCR: NEGATIVE
Staphylococcus aureus: NEGATIVE

## 2014-10-30 LAB — ABO/RH: ABO/RH(D): A POS

## 2014-10-30 LAB — APTT: aPTT: 34 seconds (ref 24–37)

## 2014-10-30 LAB — PROTIME-INR
INR: 0.97 (ref 0.00–1.49)
PROTHROMBIN TIME: 13 s (ref 11.6–15.2)

## 2014-10-30 NOTE — Pre-Procedure Instructions (Signed)
Shirley Bush  10/30/2014   Your procedure is scheduled on:  Wednesday November 11, 2014 at 1:15 PM.  Report to [facility] Admitting at 11:15 AM.  Call this number if you have problems the morning of surgery: [phone]   Remember:   Do not eat food or drink liquids after midnight.   Take these medicines the morning of surgery with A SIP OF WATER: Hydrocodone if needed, Ranitidine (Zantac) and Nasacort nasal spray if needed, and Tramadol (Ultram) if needed   Please stop taking any vitamins, herbal medications, Ibuprofen, Advil, Motrin, Alleve, etc on Wednesday April 20th   Do not wear jewelry, make-up or nail polish.  Do not wear lotions, powders, or perfumes.   Do not shave 48 hours prior to surgery.   Do not bring valuables to the hospital.  [facility] is not responsible for any belongings or valuables.               Contacts, dentures or bridgework may not be worn into surgery.  Leave suitcase in the car. After surgery it may be brought to your room.  For patients admitted to the hospital, discharge time is determined by your treatment team.               Patients discharged the day of surgery will not be allowed to drive home.  Name and phone number of your driver:   Special Instructions: Shower using CHG soap the night before and the morning of your surgery   Please read over the following fact sheets that you were given: Pain Booklet, Coughing and Deep Breathing, Blood Transfusion Information, Total Joint Packet, MRSA Information and Surgical Site Infection Prevention

## 2014-10-30 NOTE — Progress Notes (Signed)
PCP is Tula Nakayama. Patient denied having any cardiac or pulmonary issues.

## 2014-10-31 LAB — URINE CULTURE

## 2014-11-05 ENCOUNTER — Encounter: Payer: Self-pay | Admitting: Family Medicine

## 2014-11-10 MED ORDER — CEFAZOLIN SODIUM-DEXTROSE 2-3 GM-% IV SOLR
2.0000 g | INTRAVENOUS | Status: AC
  Administered 2014-11-11: 2 g via INTRAVENOUS
  Filled 2014-11-10: qty 50

## 2014-11-10 MED ORDER — CHLORHEXIDINE GLUCONATE 4 % EX LIQD
60.0000 mL | Freq: Once | CUTANEOUS | Status: DC
  Filled 2014-11-10: qty 60

## 2014-11-10 MED ORDER — LACTATED RINGERS IV SOLN
INTRAVENOUS | Status: DC
  Administered 2014-11-11: 12:00:00 via INTRAVENOUS

## 2014-11-11 ENCOUNTER — Inpatient Hospital Stay (HOSPITAL_COMMUNITY): Payer: Worker's Compensation | Admitting: Anesthesiology

## 2014-11-11 ENCOUNTER — Inpatient Hospital Stay (HOSPITAL_COMMUNITY): Payer: Worker's Compensation

## 2014-11-11 ENCOUNTER — Encounter (HOSPITAL_COMMUNITY): Payer: Self-pay | Admitting: *Deleted

## 2014-11-11 ENCOUNTER — Inpatient Hospital Stay (HOSPITAL_COMMUNITY)
Admission: RE | Admit: 2014-11-11 | Discharge: 2014-11-16 | DRG: 470 | Disposition: A | Discharge status: Home Health | Payer: Worker's Compensation | Source: Ambulatory Visit | Attending: Orthopedic Surgery | Admitting: Orthopedic Surgery

## 2014-11-11 ENCOUNTER — Encounter (HOSPITAL_COMMUNITY)
Admission: RE | Disposition: A | Discharge status: Home Health | Payer: Self-pay | Source: Ambulatory Visit | Attending: Orthopedic Surgery

## 2014-11-11 DIAGNOSIS — Z7902 Long term (current) use of antithrombotics/antiplatelets: Secondary | ICD-10-CM

## 2014-11-11 DIAGNOSIS — D62 Acute posthemorrhagic anemia: Secondary | ICD-10-CM | POA: Diagnosis not present

## 2014-11-11 DIAGNOSIS — M797 Fibromyalgia: Secondary | ICD-10-CM | POA: Diagnosis present

## 2014-11-11 DIAGNOSIS — I10 Essential (primary) hypertension: Secondary | ICD-10-CM | POA: Diagnosis present

## 2014-11-11 DIAGNOSIS — Z96659 Presence of unspecified artificial knee joint: Secondary | ICD-10-CM

## 2014-11-11 DIAGNOSIS — M25562 Pain in left knee: Secondary | ICD-10-CM | POA: Diagnosis present

## 2014-11-11 DIAGNOSIS — Z811 Family history of alcohol abuse and dependence: Secondary | ICD-10-CM

## 2014-11-11 DIAGNOSIS — R112 Nausea with vomiting, unspecified: Secondary | ICD-10-CM | POA: Diagnosis not present

## 2014-11-11 DIAGNOSIS — M1712 Unilateral primary osteoarthritis, left knee: Secondary | ICD-10-CM | POA: Diagnosis present

## 2014-11-11 DIAGNOSIS — M1710 Unilateral primary osteoarthritis, unspecified knee: Secondary | ICD-10-CM | POA: Diagnosis present

## 2014-11-11 DIAGNOSIS — Z8249 Family history of ischemic heart disease and other diseases of the circulatory system: Secondary | ICD-10-CM

## 2014-11-11 DIAGNOSIS — Z833 Family history of diabetes mellitus: Secondary | ICD-10-CM

## 2014-11-11 DIAGNOSIS — Z79899 Other long term (current) drug therapy: Secondary | ICD-10-CM

## 2014-11-11 DIAGNOSIS — E785 Hyperlipidemia, unspecified: Secondary | ICD-10-CM | POA: Diagnosis present

## 2014-11-11 DIAGNOSIS — M179 Osteoarthritis of knee, unspecified: Secondary | ICD-10-CM | POA: Diagnosis present

## 2014-11-11 HISTORY — PX: TOTAL KNEE ARTHROPLASTY: SHX125

## 2014-11-11 SURGERY — ARTHROPLASTY, KNEE, TOTAL
Anesthesia: Spinal | Site: Knee | Laterality: Left

## 2014-11-11 MED ORDER — ONDANSETRON HCL 4 MG/2ML IJ SOLN
4.0000 mg | Freq: Four times a day (QID) | INTRAMUSCULAR | Status: DC | PRN
  Administered 2014-11-11 – 2014-11-12 (×3): 4 mg via INTRAVENOUS
  Filled 2014-11-11 (×3): qty 2

## 2014-11-11 MED ORDER — LISINOPRIL 20 MG PO TABS
20.0000 mg | ORAL_TABLET | Freq: Every day | ORAL | Status: DC
  Administered 2014-11-12 – 2014-11-16 (×5): 20 mg via ORAL
  Filled 2014-11-11 (×5): qty 1

## 2014-11-11 MED ORDER — SODIUM CHLORIDE 0.9 % IR SOLN
Status: DC | PRN
  Administered 2014-11-11: 3000 mL

## 2014-11-11 MED ORDER — CELECOXIB 200 MG PO CAPS
200.0000 mg | ORAL_CAPSULE | Freq: Two times a day (BID) | ORAL | Status: DC
  Administered 2014-11-12 – 2014-11-16 (×9): 200 mg via ORAL
  Filled 2014-11-11 (×9): qty 1

## 2014-11-11 MED ORDER — ONDANSETRON HCL 4 MG PO TABS: 4.0000 mg | ORAL_TABLET | Freq: Three times a day (TID) | ORAL | Status: DC | PRN

## 2014-11-11 MED ORDER — PROPOFOL 10 MG/ML IV BOLUS
INTRAVENOUS | Status: AC
  Filled 2014-11-11: qty 20

## 2014-11-11 MED ORDER — HYDROMORPHONE HCL 1 MG/ML IJ SOLN
0.2500 mg | INTRAMUSCULAR | Status: DC | PRN
  Administered 2014-11-11 (×2): 0.5 mg via INTRAVENOUS

## 2014-11-11 MED ORDER — HYDROMORPHONE HCL 1 MG/ML IJ SOLN
0.5000 mg | INTRAMUSCULAR | Status: DC | PRN
  Administered 2014-11-11 – 2014-11-12 (×3): 1 mg via INTRAVENOUS
  Administered 2014-11-12: 0.5 mg via INTRAVENOUS
  Filled 2014-11-11 (×6): qty 1

## 2014-11-11 MED ORDER — HYDROMORPHONE HCL 1 MG/ML IJ SOLN
INTRAMUSCULAR | Status: AC
  Filled 2014-11-11: qty 1

## 2014-11-11 MED ORDER — PROPOFOL INFUSION 10 MG/ML OPTIME
INTRAVENOUS | Status: DC | PRN
  Administered 2014-11-11: 75 ug/kg/min via INTRAVENOUS

## 2014-11-11 MED ORDER — CEFAZOLIN SODIUM 1-5 GM-% IV SOLN
1.0000 g | Freq: Four times a day (QID) | INTRAVENOUS | Status: AC
  Administered 2014-11-11 – 2014-11-12 (×2): 1 g via INTRAVENOUS
  Filled 2014-11-11 (×2): qty 50

## 2014-11-11 MED ORDER — SODIUM CHLORIDE 0.9 % IJ SOLN
INTRAMUSCULAR | Status: DC | PRN
  Administered 2014-11-11: 10 mL

## 2014-11-11 MED ORDER — METHOCARBAMOL 1000 MG/10ML IJ SOLN
500.0000 mg | Freq: Four times a day (QID) | INTRAVENOUS | Status: DC | PRN
  Filled 2014-11-11: qty 5

## 2014-11-11 MED ORDER — ONDANSETRON HCL 4 MG PO TABS: 4.0000 mg | ORAL_TABLET | Freq: Four times a day (QID) | ORAL | Status: DC | PRN

## 2014-11-11 MED ORDER — ACETAMINOPHEN 650 MG RE SUPP: 650.0000 mg | Freq: Four times a day (QID) | RECTAL | Status: DC | PRN

## 2014-11-11 MED ORDER — BUPIVACAINE LIPOSOME 1.3 % IJ SUSP
INTRAMUSCULAR | Status: DC | PRN
  Administered 2014-11-11: 20 mL

## 2014-11-11 MED ORDER — DIPHENHYDRAMINE HCL 12.5 MG/5ML PO ELIX: 12.5000 mg | ORAL_SOLUTION | ORAL | Status: DC | PRN

## 2014-11-11 MED ORDER — BISACODYL 5 MG PO TBEC: 5.0000 mg | DELAYED_RELEASE_TABLET | Freq: Every day | ORAL | Status: DC | PRN

## 2014-11-11 MED ORDER — MENTHOL 3 MG MT LOZG: 1.0000 | LOZENGE | OROMUCOSAL | Status: DC | PRN

## 2014-11-11 MED ORDER — BUPIVACAINE HCL (PF) 0.25 % IJ SOLN
INTRAMUSCULAR | Status: AC
  Filled 2014-11-11: qty 30

## 2014-11-11 MED ORDER — OXYCODONE HCL 5 MG PO TABS: 5.0000 mg | ORAL_TABLET | Freq: Once | ORAL | Status: DC | PRN

## 2014-11-11 MED ORDER — HYDROCHLOROTHIAZIDE 25 MG PO TABS
25.0000 mg | ORAL_TABLET | Freq: Every day | ORAL | Status: DC
  Administered 2014-11-12 – 2014-11-16 (×5): 25 mg via ORAL
  Filled 2014-11-11 (×5): qty 1

## 2014-11-11 MED ORDER — ONDANSETRON HCL 4 MG/2ML IJ SOLN
INTRAMUSCULAR | Status: DC | PRN
  Administered 2014-11-11: 4 mg via INTRAVENOUS

## 2014-11-11 MED ORDER — SENNOSIDES-DOCUSATE SODIUM 8.6-50 MG PO TABS: 1.0000 | ORAL_TABLET | Freq: Every evening | ORAL | Status: DC | PRN

## 2014-11-11 MED ORDER — ONDANSETRON HCL 4 MG/2ML IJ SOLN
INTRAMUSCULAR | Status: AC
  Filled 2014-11-11: qty 2

## 2014-11-11 MED ORDER — FENTANYL CITRATE (PF) 250 MCG/5ML IJ SOLN
INTRAMUSCULAR | Status: AC
  Filled 2014-11-11: qty 5

## 2014-11-11 MED ORDER — FAMOTIDINE 20 MG PO TABS
20.0000 mg | ORAL_TABLET | Freq: Two times a day (BID) | ORAL | Status: DC
  Administered 2014-11-11 – 2014-11-16 (×10): 20 mg via ORAL
  Filled 2014-11-11 (×10): qty 1

## 2014-11-11 MED ORDER — ACETAMINOPHEN 325 MG PO TABS
650.0000 mg | ORAL_TABLET | Freq: Four times a day (QID) | ORAL | Status: DC | PRN
  Administered 2014-11-14: 650 mg via ORAL
  Filled 2014-11-11: qty 2

## 2014-11-11 MED ORDER — METHOCARBAMOL 500 MG PO TABS: 500.0000 mg | ORAL_TABLET | Freq: Four times a day (QID) | ORAL | Status: DC

## 2014-11-11 MED ORDER — DEXAMETHASONE SODIUM PHOSPHATE 10 MG/ML IJ SOLN
10.0000 mg | Freq: Once | INTRAMUSCULAR | Status: AC
  Administered 2014-11-12: 10 mg via INTRAVENOUS
  Filled 2014-11-11: qty 1

## 2014-11-11 MED ORDER — 0.9 % SODIUM CHLORIDE (POUR BTL) OPTIME
TOPICAL | Status: DC | PRN
  Administered 2014-11-11: 1000 mL

## 2014-11-11 MED ORDER — DOCUSATE SODIUM 100 MG PO CAPS
100.0000 mg | ORAL_CAPSULE | Freq: Two times a day (BID) | ORAL | Status: DC
  Administered 2014-11-11 – 2014-11-16 (×10): 100 mg via ORAL
  Filled 2014-11-11 (×10): qty 1

## 2014-11-11 MED ORDER — METOCLOPRAMIDE HCL 5 MG PO TABS: 5.0000 mg | ORAL_TABLET | Freq: Three times a day (TID) | ORAL | Status: DC | PRN

## 2014-11-11 MED ORDER — MIDAZOLAM HCL 2 MG/2ML IJ SOLN
INTRAMUSCULAR | Status: AC
  Filled 2014-11-11: qty 2

## 2014-11-11 MED ORDER — METHOCARBAMOL 500 MG PO TABS
500.0000 mg | ORAL_TABLET | Freq: Four times a day (QID) | ORAL | Status: DC | PRN
  Administered 2014-11-11 – 2014-11-16 (×8): 500 mg via ORAL
  Filled 2014-11-11 (×8): qty 1

## 2014-11-11 MED ORDER — OXYCODONE-ACETAMINOPHEN 5-325 MG PO TABS: 1.0000 | ORAL_TABLET | ORAL | Status: DC | PRN

## 2014-11-11 MED ORDER — PROMETHAZINE HCL 25 MG/ML IJ SOLN: 6.2500 mg | INTRAMUSCULAR | Status: DC | PRN

## 2014-11-11 MED ORDER — CALCIUM CARBONATE 1250 (500 CA) MG PO TABS
1.0000 | ORAL_TABLET | Freq: Two times a day (BID) | ORAL | Status: DC
  Administered 2014-11-12 – 2014-11-16 (×8): 500 mg via ORAL
  Filled 2014-11-11 (×8): qty 1

## 2014-11-11 MED ORDER — CALCIUM CARBONATE 600 MG PO TABS: 600.0000 mg | ORAL_TABLET | Freq: Two times a day (BID) | ORAL | Status: DC

## 2014-11-11 MED ORDER — ASPIRIN-ACETAMINOPHEN-CAFFEINE 250-250-65 MG PO TABS
2.0000 | ORAL_TABLET | Freq: Three times a day (TID) | ORAL | Status: DC | PRN
  Administered 2014-11-14: 2 via ORAL
  Filled 2014-11-11 (×5): qty 2

## 2014-11-11 MED ORDER — BUPIVACAINE HCL 0.5 % IJ SOLN
INTRAMUSCULAR | Status: DC | PRN
  Administered 2014-11-11: 10 mL

## 2014-11-11 MED ORDER — ZOLPIDEM TARTRATE 5 MG PO TABS: 5.0000 mg | ORAL_TABLET | Freq: Every evening | ORAL | Status: DC | PRN

## 2014-11-11 MED ORDER — POTASSIUM CHLORIDE IN NACL 20-0.9 MEQ/L-% IV SOLN
INTRAVENOUS | Status: DC
  Administered 2014-11-11: 19:00:00 via INTRAVENOUS
  Filled 2014-11-11 (×3): qty 1000

## 2014-11-11 MED ORDER — APIXABAN 2.5 MG PO TABS
2.5000 mg | ORAL_TABLET | Freq: Two times a day (BID) | ORAL | Status: DC
  Administered 2014-11-12 – 2014-11-16 (×10): 2.5 mg via ORAL
  Filled 2014-11-11 (×10): qty 1

## 2014-11-11 MED ORDER — OXYCODONE HCL 5 MG/5ML PO SOLN: 5.0000 mg | Freq: Once | ORAL | Status: DC | PRN

## 2014-11-11 MED ORDER — FLEET ENEMA 7-19 GM/118ML RE ENEM: 1.0000 | ENEMA | Freq: Once | RECTAL | Status: AC | PRN

## 2014-11-11 MED ORDER — LISINOPRIL-HYDROCHLOROTHIAZIDE 20-25 MG PO TABS: 1.0000 | ORAL_TABLET | Freq: Every day | ORAL | Status: DC

## 2014-11-11 MED ORDER — BUPIVACAINE LIPOSOME 1.3 % IJ SUSP
20.0000 mL | INTRAMUSCULAR | Status: DC
  Filled 2014-11-11: qty 20

## 2014-11-11 MED ORDER — PHENOL 1.4 % MT LIQD: 1.0000 | OROMUCOSAL | Status: DC | PRN

## 2014-11-11 MED ORDER — FENTANYL CITRATE (PF) 100 MCG/2ML IJ SOLN
INTRAMUSCULAR | Status: DC | PRN
  Administered 2014-11-11 (×2): 50 ug via INTRAVENOUS
  Administered 2014-11-11: 100 ug via INTRAVENOUS
  Administered 2014-11-11 (×3): 50 ug via INTRAVENOUS
  Administered 2014-11-11: 100 ug via INTRAVENOUS
  Administered 2014-11-11: 50 ug via INTRAVENOUS

## 2014-11-11 MED ORDER — METOCLOPRAMIDE HCL 5 MG/ML IJ SOLN: 5.0000 mg | Freq: Three times a day (TID) | INTRAMUSCULAR | Status: DC | PRN

## 2014-11-11 MED ORDER — APIXABAN 2.5 MG PO TABS: ORAL_TABLET | ORAL | Status: DC

## 2014-11-11 MED ORDER — LACTATED RINGERS IV SOLN
INTRAVENOUS | Status: DC | PRN
  Administered 2014-11-11: 13:00:00 via INTRAVENOUS

## 2014-11-11 MED ORDER — VITAMIN D 1000 UNITS PO TABS
1000.0000 [IU] | ORAL_TABLET | Freq: Every day | ORAL | Status: DC
  Administered 2014-11-12 – 2014-11-16 (×5): 1000 [IU] via ORAL
  Filled 2014-11-11 (×5): qty 1

## 2014-11-11 MED ORDER — PROPOFOL 10 MG/ML IV BOLUS
INTRAVENOUS | Status: DC | PRN
  Administered 2014-11-11: 20 mg via INTRAVENOUS

## 2014-11-11 MED ORDER — OXYCODONE HCL 5 MG PO TABS
5.0000 mg | ORAL_TABLET | ORAL | Status: DC | PRN
  Administered 2014-11-11 – 2014-11-12 (×4): 10 mg via ORAL
  Filled 2014-11-11 (×4): qty 2

## 2014-11-11 MED ORDER — MONTELUKAST SODIUM 10 MG PO TABS
10.0000 mg | ORAL_TABLET | Freq: Every day | ORAL | Status: DC
  Administered 2014-11-11 – 2014-11-15 (×5): 10 mg via ORAL
  Filled 2014-11-11 (×5): qty 1

## 2014-11-11 SURGICAL SUPPLY — 71 items
APL SKNCLS STERI-STRIP NONHPOA (GAUZE/BANDAGES/DRESSINGS) ×1
BANDAGE ELASTIC 4 VELCRO ST LF (GAUZE/BANDAGES/DRESSINGS) ×3 IMPLANT
BANDAGE ELASTIC 6 VELCRO ST LF (GAUZE/BANDAGES/DRESSINGS) ×3 IMPLANT
BANDAGE ESMARK 6X9 LF (GAUZE/BANDAGES/DRESSINGS) ×1 IMPLANT
BENZOIN TINCTURE PRP APPL 2/3 (GAUZE/BANDAGES/DRESSINGS) ×3 IMPLANT
BLADE SAG 18X100X1.27 (BLADE) ×6 IMPLANT
BNDG CMPR 9X6 STRL LF SNTH (GAUZE/BANDAGES/DRESSINGS) ×1
BNDG ESMARK 6X9 LF (GAUZE/BANDAGES/DRESSINGS) ×3
BOWL SMART MIX CTS (DISPOSABLE) ×3 IMPLANT
CAPT KNEE TOTAL 3 ×2 IMPLANT
CEMENT BONE SIMPLEX SPEEDSET (Cement) ×6 IMPLANT
CLOSURE STERI-STRIP 1/2X4 (GAUZE/BANDAGES/DRESSINGS) ×1
CLOSURE WOUND 1/2 X4 (GAUZE/BANDAGES/DRESSINGS) ×2
CLSR STERI-STRIP ANTIMIC 1/2X4 (GAUZE/BANDAGES/DRESSINGS) ×1 IMPLANT
COVER SURGICAL LIGHT HANDLE (MISCELLANEOUS) ×3 IMPLANT
CUFF TOURNIQUET SINGLE 34IN LL (TOURNIQUET CUFF) ×3 IMPLANT
DRAPE EXTREMITY T 121X128X90 (DRAPE) ×3 IMPLANT
DRAPE IMP U-DRAPE 54X76 (DRAPES) ×3 IMPLANT
DRAPE PROXIMA HALF (DRAPES) ×3 IMPLANT
DRAPE U-SHAPE 47X51 STRL (DRAPES) ×3 IMPLANT
DRSG PAD ABDOMINAL 8X10 ST (GAUZE/BANDAGES/DRESSINGS) ×3 IMPLANT
DURAPREP 26ML APPLICATOR (WOUND CARE) ×6 IMPLANT
ELECT CAUTERY BLADE 6.4 (BLADE) ×3 IMPLANT
ELECT REM PT RETURN 9FT ADLT (ELECTROSURGICAL) ×3
ELECTRODE REM PT RTRN 9FT ADLT (ELECTROSURGICAL) ×1 IMPLANT
EVACUATOR 1/8 PVC DRAIN (DRAIN) ×3 IMPLANT
FACESHIELD WRAPAROUND (MASK) ×6 IMPLANT
FACESHIELD WRAPAROUND OR TEAM (MASK) ×2 IMPLANT
GAUZE SPONGE 4X4 12PLY STRL (GAUZE/BANDAGES/DRESSINGS) ×3 IMPLANT
GLOVE BIOGEL PI IND STRL 7.0 (GLOVE) ×1 IMPLANT
GLOVE BIOGEL PI INDICATOR 7.0 (GLOVE) ×2
GLOVE ECLIPSE 7.0 STRL STRAW (GLOVE) ×3 IMPLANT
GLOVE ORTHO TXT STRL SZ7.5 (GLOVE) ×3 IMPLANT
GOWN STRL REUS W/ TWL LRG LVL3 (GOWN DISPOSABLE) ×2 IMPLANT
GOWN STRL REUS W/ TWL XL LVL3 (GOWN DISPOSABLE) ×1 IMPLANT
GOWN STRL REUS W/TWL LRG LVL3 (GOWN DISPOSABLE) ×6
GOWN STRL REUS W/TWL XL LVL3 (GOWN DISPOSABLE) ×3
HANDPIECE INTERPULSE COAX TIP (DISPOSABLE) ×3
IMMOBILIZER KNEE 22 (SOFTGOODS) ×2 IMPLANT
IMMOBILIZER KNEE 22 UNIV (SOFTGOODS) ×3 IMPLANT
IMMOBILIZER KNEE 24 THIGH 36 (MISCELLANEOUS) IMPLANT
IMMOBILIZER KNEE 24 UNIV (MISCELLANEOUS)
KIT BASIN OR (CUSTOM PROCEDURE TRAY) ×3 IMPLANT
KIT ROOM TURNOVER OR (KITS) ×3 IMPLANT
MANIFOLD NEPTUNE II (INSTRUMENTS) ×3 IMPLANT
NDL 18GX1X1/2 (RX/OR ONLY) (NEEDLE) ×1 IMPLANT
NDL HYPO 25GX1X1/2 BEV (NEEDLE) ×1 IMPLANT
NEEDLE 18GX1X1/2 (RX/OR ONLY) (NEEDLE) ×3 IMPLANT
NEEDLE HYPO 25GX1X1/2 BEV (NEEDLE) ×3 IMPLANT
NS IRRIG 1000ML POUR BTL (IV SOLUTION) ×3 IMPLANT
PACK TOTAL JOINT (CUSTOM PROCEDURE TRAY) ×3 IMPLANT
PACK UNIVERSAL I (CUSTOM PROCEDURE TRAY) ×3 IMPLANT
PAD ARMBOARD 7.5X6 YLW CONV (MISCELLANEOUS) ×6 IMPLANT
PAD CAST 4YDX4 CTTN HI CHSV (CAST SUPPLIES) ×1 IMPLANT
PADDING CAST COTTON 4X4 STRL (CAST SUPPLIES) ×3
PADDING CAST COTTON 6X4 STRL (CAST SUPPLIES) ×3 IMPLANT
SET HNDPC FAN SPRY TIP SCT (DISPOSABLE) ×1 IMPLANT
STRIP CLOSURE SKIN 1/2X4 (GAUZE/BANDAGES/DRESSINGS) ×4 IMPLANT
SUCTION FRAZIER TIP 10 FR DISP (SUCTIONS) ×3 IMPLANT
SUT MNCRL AB 4-0 PS2 18 (SUTURE) ×3 IMPLANT
SUT VIC AB 0 CT1 27 (SUTURE)
SUT VIC AB 0 CT1 27XBRD ANBCTR (SUTURE) IMPLANT
SUT VIC AB 1 CT1 27 (SUTURE) ×6
SUT VIC AB 1 CT1 27XBRD ANBCTR (SUTURE) ×2 IMPLANT
SUT VIC AB 2-0 CT1 27 (SUTURE) ×6
SUT VIC AB 2-0 CT1 TAPERPNT 27 (SUTURE) ×2 IMPLANT
SYR 50ML LL SCALE MARK (SYRINGE) ×3 IMPLANT
SYR CONTROL 10ML LL (SYRINGE) ×3 IMPLANT
TOWEL OR 17X24 6PK STRL BLUE (TOWEL DISPOSABLE) ×3 IMPLANT
TOWEL OR 17X26 10 PK STRL BLUE (TOWEL DISPOSABLE) ×3 IMPLANT
WATER STERILE IRR 1000ML POUR (IV SOLUTION) ×3 IMPLANT

## 2014-11-11 NOTE — Anesthesia Preprocedure Evaluation (Signed)
Anesthesia Evaluation  Patient identified by MRN, date of birth, ID band Patient awake    Airway Mallampati: I       Dental  (+) Teeth Intact   Pulmonary neg pulmonary ROS,  breath sounds clear to auscultation        Cardiovascular hypertension, negative cardio ROS  Rhythm:Regular     Neuro/Psych  Headaches,  Neuromuscular disease    GI/Hepatic Neg liver ROS,   Endo/Other  negative endocrine ROS  Renal/GU negative Renal ROS     Musculoskeletal  (+) Fibromyalgia -  Abdominal   Peds  Hematology   Anesthesia Other Findings   Reproductive/Obstetrics                             Anesthesia Physical Anesthesia Plan  ASA: II  Anesthesia Plan: Spinal   Post-op Pain Management:    Induction: Intravenous  Airway Management Planned: Natural Airway and Simple Face Mask  Additional Equipment:   Intra-op Plan:   Post-operative Plan: Extubation in OR  Informed Consent:   Plan Discussed with:   Anesthesia Plan Comments:         Anesthesia Quick Evaluation

## 2014-11-11 NOTE — Anesthesia Postprocedure Evaluation (Signed)
  Anesthesia Post-op Note  Patient: Marcille Blanco  Procedure(s) Performed: Procedure(s): LEFT TOTAL KNEE ARTHROPLASTY (Left)  Patient Location: PACU  Anesthesia Type:Spinal  Level of Consciousness: awake and alert   Airway and Oxygen Therapy: Patient Spontanous Breathing  Post-op Pain: no pain as spinal still intact  Post-op Assessment: Post-op Vital signs reviewed and Patient's Cardiovascular Status Stable  Post-op Vital Signs: stable  Last Vitals:  Filed Vitals:   11/11/14 1515  BP:   Pulse: 62  Temp:   Resp: 16    Complications: No apparent anesthesia complications

## 2014-11-11 NOTE — Transfer of Care (Signed)
Immediate Anesthesia Transfer of Care Note  Patient: Shirley Bush  Procedure(s) Performed: Procedure(s): LEFT TOTAL KNEE ARTHROPLASTY (Left)  Patient Location: PACU  Anesthesia Type:Spinal  Level of Consciousness: awake, alert  and oriented  Airway & Oxygen Therapy: Patient Spontanous Breathing and Patient connected to nasal cannula oxygen  Post-op Assessment: Report given to RN and Post -op Vital signs reviewed and stable  Post vital signs: Reviewed and stable  Last Vitals:  Filed Vitals:   11/11/14 1115  BP: 119/78  Pulse: 90  Temp: 36.6 C  Resp: 18    Complications: No apparent anesthesia complications

## 2014-11-11 NOTE — Progress Notes (Signed)
Orthopedic Tech Progress Note Patient Details:  Shirley Bush May 28, 1956 [id number] Applied CPM to LLE.  Applied OHF with trapeze to pt.'s bed. CPM Left Knee CPM Left Knee: On Left Knee Flexion (Degrees): 90 Left Knee Extension (Degrees): 0   Darrol Poke 11/11/2014, 3:22 PM

## 2014-11-11 NOTE — Interval H&P Note (Signed)
History and Physical Interval Note:  11/11/2014 8:33 AM  Shirley Bush  has presented today for surgery, with the diagnosis of djd left kne  The various methods of treatment have been discussed with the patient and family. After consideration of risks, benefits and other options for treatment, the patient has consented to  Procedure(s): LEFT TOTAL KNEE ARTHROPLASTY (Left) as a surgical intervention .  The patient's history has been reviewed, patient examined, no change in status, stable for surgery.  I have reviewed the patient's chart and labs.  Questions were answered to the patient's satisfaction.     [name] F

## 2014-11-11 NOTE — Discharge Summary (Addendum)
Patient ID: Shirley Bush MRN: [id number] DOB/AGE: 57-Dec-1957 57 y.o.  Admit date: 11/11/2014 Discharge date: 11/16/2014  Admission Diagnoses:  Active Problems:   DJD (degenerative joint disease) of Bush   Discharge Diagnoses:  Same  Past Medical History  Diagnosis Date  . Migraines   . Fibromyalgia   . Medial meniscus tear 04/2013    left  . Hypertension     under control with meds., has been on med. x 4 yr.    Surgeries: Procedure(s): LEFT TOTAL Bush ARTHROPLASTY on 11/11/2014   Consultants:    Discharged Condition: Improved  Hospital Course: Shirley Bush is an 59 y.o. female who was admitted 11/11/2014 for operative treatment of primary localized osteoarthritis left Bush. Patient has severe unremitting pain that affects sleep, daily activities, and work/hobbies. After pre-op clearance the patient was taken to the operating room on 11/11/2014 and underwent  Procedure(s): LEFT TOTAL Bush ARTHROPLASTY.  Patient with a pre-op Hb of 13.6 developed ABLA on pod #1 with a Hb of 10.5 and 10.1 on pod #2.  She is currently stable but we will continue to follow.  Patient was given perioperative antibiotics:      Anti-infectives    Start     Dose/Rate Route Frequency Ordered Stop   11/11/14 1900  ceFAZolin (ANCEF) IVPB 1 g/50 mL premix     1 g 100 mL/hr over 30 Minutes Intravenous Every 6 hours 11/11/14 1759 11/12/14 0044   11/11/14 0600  ceFAZolin (ANCEF) IVPB 2 g/50 mL premix     2 g 100 mL/hr over 30 Minutes Intravenous On call to O.R. 11/10/14 1424 11/11/14 1300       Patient was given sequential compression devices, early ambulation, and chemoprophylaxis to prevent DVT.  Patient benefited maximally from hospital stay and there were no complications.    Recent vital signs:  Patient Vitals for the past 24 hrs:  BP Temp Temp src Pulse Resp SpO2  11/16/14 0600 111/68 mmHg 98.1 F (36.7 C) Oral 92 16 97 %  11/15/14 2036 128/78 mmHg 98.3 F (36.8 C) Oral 97 16 98 %   11/15/14 1600 - - - - 16 100 %  11/15/14 1527 98/68 mmHg 98.1 F (36.7 C) Oral (!) 120 16 100 %  11/15/14 1200 - - - - 16 100 %     Recent laboratory studies:   Recent Labs  11/14/14 0354  WBC 10.2  HGB 10.2*  HCT 29.0*  PLT 268  NA 136  K 4.3  CL 98  CO2 28  BUN 9  CREATININE 0.78  GLUCOSE 105*  CALCIUM 9.4     Discharge Medications:     Medication List    STOP taking these medications        aspirin-acetaminophen-caffeine 250-250-65 MG per tablet  Commonly known as:  EXCEDRIN MIGRAINE     cyclobenzaprine 10 MG tablet  Commonly known as:  FLEXERIL     HYDROcodone-acetaminophen 5-325 MG per tablet  Commonly known as:  NORCO/VICODIN     traMADol 50 MG tablet  Commonly known as:  ULTRAM      TAKE these medications        apixaban 2.5 MG Tabs tablet  Commonly known as:  ELIQUIS  Take 1 tab po q12 hours x 12 days following surgery to prevent blood clots     bisacodyl 5 MG EC tablet  Commonly known as:  DULCOLAX  Take 1 tablet (5 mg total) by mouth daily as needed for moderate constipation.  calcium carbonate 600 MG Tabs tablet  Commonly known as:  OS-CAL  Take 600 mg by mouth 2 (two) times daily with a meal.     cholecalciferol 1000 UNITS tablet  Commonly known as:  VITAMIN D  Take 1,000 Units by mouth daily.     HAIR/SKIN/NAILS PO  Take 3 tablets by mouth daily. 3 tabs daily     lisinopril-hydrochlorothiazide 20-25 MG per tablet  Commonly known as:  PRINZIDE,ZESTORETIC  Take 1 tablet by mouth daily.     meperidine 50 MG tablet  Commonly known as:  DEMEROL  Take 1-2 tabs q4-6 hours prn pain     methocarbamol 500 MG tablet  Commonly known as:  ROBAXIN  Take 1 tablet (500 mg total) by mouth 4 (four) times daily.     montelukast 10 MG tablet  Commonly known as:  SINGULAIR  Take 1 tablet (10 mg total) by mouth at bedtime.     NASACORT ALLERGY 24HR NA  Place 1 spray into the nose daily as needed (allergies).     ondansetron 4 MG tablet   Commonly known as:  ZOFRAN  Take 1 tablet (4 mg total) by mouth every 8 (eight) hours as needed for nausea or vomiting.     ranitidine 150 MG capsule  Commonly known as:  ZANTAC  Take 1 capsule (150 mg total) by mouth 2 (two) times daily.        Diagnostic Studies: Dg Chest 2 View  10/20/2014   CLINICAL DATA:  Pre-op for Bush surgery on 11/11/2014. History of hypertension. Initial encounter.  EXAM: CHEST  2 VIEW  COMPARISON:  06/26/2011 radiographs.  FINDINGS: The heart size and mediastinal contours are normal. The lungs are clear. There is no pleural effusion or pneumothorax. No acute osseous findings are identified. Mild scoliosis is not as well demonstrated on the current examination. There are postsurgical changes in the left shoulder.  IMPRESSION: Stable chest.  No active cardiopulmonary process.   Electronically Signed   By: Richardean Sale M.D.   On: 10/20/2014 14:20   Dg Bush Left Port  11/11/2014   CLINICAL DATA:  59 year old female status post left total Bush arthroplasty  EXAM: PORTABLE LEFT Bush - 1-2 VIEW  COMPARISON:  Preoperative radiographs 11/14/2013  FINDINGS: Surgical changes of a total Bush arthroplasty without evidence of hardware complication. Surgical drain noted in the suprapatellar recess. Expected postsurgical subcutaneous emphysema. Normal bony mineralization. No lytic or blastic osseous lesion.  IMPRESSION: Left total Bush arthroplasty without evidence of hardware complication.   Electronically Signed   By: Jacqulynn Cadet M.D.   On: 11/11/2014 20:01    Disposition: 01-Home or Self Care  Discharge Instructions    CPM    Complete by:  As directed   Continuous passive motion machine (CPM):      Use the CPM from 0- to 60 for 6 hours per day.      You may increase by 10 per day.  You may break it up into 2 or 3 sessions per day.      Use CPM for 2-3 weeks or until you are told to stop.     Call MD / Call 911    Complete by:  As directed   If you experience chest  pain or shortness of breath, CALL 911 and be transported to the hospital emergency room.  If you develope a fever above 101 F, pus (white drainage) or increased drainage or redness at the wound, or calf pain, call  your surgeon's office.     Change dressing    Complete by:  As directed   Change dressing on saturday, then change the dressing daily with sterile 4 x 4 inch gauze dressing and apply TED hose.  You may clean the incision with alcohol prior to redressing.     Constipation Prevention    Complete by:  As directed   Drink plenty of fluids.  Prune juice may be helpful.  You may use a stool softener, such as Colace (over the counter) 100 mg twice a day.  Use MiraLax (over the counter) for constipation as needed.     Diet - low sodium heart healthy    Complete by:  As directed      Discharge instructions    Complete by:  As directed   INSTRUCTIONS AFTER JOINT REPLACEMENT   Remove items at home which could result in a fall. This includes throw rugs or furniture in walking pathways ICE to the affected joint every three hours while awake for 30 minutes at a time, for at least the first 3-5 days, and then as needed for pain and swelling.  Continue to use ice for pain and swelling. You may notice swelling that will progress down to the foot and ankle.  This is normal after surgery.  Elevate your leg when you are not up walking on it.   Continue to use the breathing machine you got in the hospital (incentive spirometer) which will help keep your temperature down.  It is common for your temperature to cycle up and down following surgery, especially at night when you are not up moving around and exerting yourself.  The breathing machine keeps your lungs expanded and your temperature down.  BLOOD THINNER:  TAKE ELIQUIS AS DIRECTED FOR A TOTAL OF 12 DAYS FOLLOWING SURGERY TO PREVENT BLOOD CLOTS  DIET:  As you were doing prior to hospitalization, we recommend a well-balanced diet.  DRESSING / WOUND CARE  / SHOWERING  You may change your dressing 3-5 days after surgery.  Then change the dressing every day with sterile gauze.  Please use good hand washing techniques before changing the dressing.  Do not use any lotions or creams on the incision until instructed by your surgeon. and You may shower 3 days after surgery, but keep the wounds dry during showering.  You may use an occlusive plastic wrap (Press'n Seal for example), NO SOAKING/SUBMERGING IN THE BATHTUB.  If the bandage gets wet, change with a clean dry gauze.  If the incision gets wet, pat the wound dry with a clean towel.  ACTIVITY  Increase activity slowly as tolerated, but follow the weight bearing instructions below.   No driving for 6 weeks or until further direction given by your physician.  You cannot drive while taking narcotics.  No lifting or carrying greater than 10 lbs. until further directed by your surgeon. Avoid periods of inactivity such as sitting longer than an hour when not asleep. This helps prevent blood clots.  You may return to work once you are authorized by your doctor.     WEIGHT BEARING   Weight bearing as tolerated with assist device (walker, cane, etc) as directed, use it as long as suggested by your surgeon or therapist, typically at least 4-6 weeks.   EXERCISES  Results after joint replacement surgery are often greatly improved when you follow the exercise, range of motion and muscle strengthening exercises prescribed by your doctor. Safety measures are also important to  protect the joint from further injury. Any time any of these exercises cause you to have increased pain or swelling, decrease what you are doing until you are comfortable again and then slowly increase them. If you have problems or questions, call your caregiver or physical therapist for advice.   Rehabilitation is important following a joint replacement. After just a few days of immobilization, the muscles of the leg can become weakened  and shrink (atrophy).  These exercises are designed to build up the tone and strength of the thigh and leg muscles and to improve motion. Often times heat used for twenty to thirty minutes before working out will loosen up your tissues and help with improving the range of motion but do not use heat for the first two weeks following surgery (sometimes heat can increase post-operative swelling).   These exercises can be done on a training (exercise) mat, on the floor, on a table or on a bed. Use whatever works the best and is most comfortable for you.    Use music or television while you are exercising so that the exercises are a pleasant break in your day. This will make your life better with the exercises acting as a break in your routine that you can look forward to.   Perform all exercises about fifteen times, three times per day or as directed.  You should exercise both the operative leg and the other leg as well.  Exercises include:   Quad Sets - Tighten up the muscle on the front of the thigh (Quad) and hold for 5-10 seconds.   Straight Leg Raises - With your Bush straight (if you were given a brace, keep it on), lift the leg to 60 degrees, hold for 3 seconds, and slowly lower the leg.  Perform this exercise against resistance later as your leg gets stronger.  Leg Slides: Lying on your back, slowly slide your foot toward your buttocks, bending your Bush up off the floor (only go as far as is comfortable). Then slowly slide your foot back down until your leg is flat on the floor again.  Angel Wings: Lying on your back spread your legs to the side as far apart as you can without causing discomfort.  Hamstring Strength:  Lying on your back, push your heel against the floor with your leg straight by tightening up the muscles of your buttocks.  Repeat, but this time bend your Bush to a comfortable angle, and push your heel against the floor.  You may put a pillow under the heel to make it more comfortable  if necessary.   A rehabilitation program following joint replacement surgery can speed recovery and prevent re-injury in the future due to weakened muscles. Contact your doctor or a physical therapist for more information on Bush rehabilitation.    CONSTIPATION  Constipation is defined medically as fewer than three stools per week and severe constipation as less than one stool per week.  Even if you have a regular bowel pattern at home, your normal regimen is likely to be disrupted due to multiple reasons following surgery.  Combination of anesthesia, postoperative narcotics, change in appetite and fluid intake all can affect your bowels.   YOU MUST use at least one of the following options; they are listed in order of increasing strength to get the job done.  They are all available over the counter, and you may need to use some, POSSIBLY even all of these options:    Drink plenty  of fluids (prune juice may be helpful) and high fiber foods Colace 100 mg by mouth twice a day  Senokot for constipation as directed and as needed Dulcolax (bisacodyl), take with full glass of water  Miralax (polyethylene glycol) once or twice a day as needed.  If you have tried all these things and are unable to have a bowel movement in the first 3-4 days after surgery call either your surgeon or your primary doctor.    If you experience loose stools or diarrhea, hold the medications until you stool forms back up.  If your symptoms do not get better within 1 week or if they get worse, check with your doctor.  If you experience "the worst abdominal pain ever" or develop nausea or vomiting, please contact the office immediately for further recommendations for treatment.   ITCHING:  If you experience itching with your medications, try taking only a single pain pill, or even half a pain pill at a time.  You can also use Benadryl over the counter for itching or also to help with sleep.   TED HOSE STOCKINGS:  Use  stockings on both legs until for at least 2 weeks or as directed by physician office. They may be removed at night for sleeping.  MEDICATIONS:  See your medication summary on the "After Visit Summary" that nursing will review with you.  You may have some home medications which will be placed on hold until you complete the course of blood thinner medication.  It is important for you to complete the blood thinner medication as prescribed.  PRECAUTIONS:  If you experience chest pain or shortness of breath - call 911 immediately for transfer to the hospital emergency department.   If you develop a fever greater that 101 F, purulent drainage from wound, increased redness or drainage from wound, foul odor from the wound/dressing, or calf pain - CONTACT YOUR SURGEON.                                                   FOLLOW-UP APPOINTMENTS:  If you do not already have a post-op appointment, please call the office for an appointment to be seen by your surgeon.  Guidelines for how soon to be seen are listed in your "After Visit Summary", but are typically between 1-4 weeks after surgery.  OTHER INSTRUCTIONS:   Bush Replacement:  Do not place pillow under Bush, focus on keeping the Bush straight while resting. CPM instructions: 0-90 degrees, 2 hours in the morning, 2 hours in the afternoon, and 2 hours in the evening. Place foam block, curve side up under heel at all times except when in CPM or when walking.  DO NOT modify, tear, cut, or change the foam block in any way.  MAKE SURE YOU:  Understand these instructions.  Get help right away if you are not doing well or get worse.     Do not put a pillow under the Bush. Place it under the heel.    Complete by:  As directed   Place gray foam under operative heel when in bed or in a chair to work on extension     Increase activity slowly as tolerated    Complete by:  As directed      TED hose    Complete by:  As directed   Use  stockings (TED hose) for 2  weeks on both leg(s).  You may remove them at night for sleeping.           Follow-up Information    Follow up with [facility], MD. Schedule an appointment as soon as possible for a visit in 1 week.   Specialty:  Orthopedic Surgery   Contact information:   [address] [place] [address] [phone]        Signed: Fannie Bush 11/16/2014, 10:03 AM

## 2014-11-11 NOTE — H&P (View-Only) (Signed)
TOTAL KNEE ADMISSION H&P  Patient is being admitted for right total knee arthroplasty.  Subjective:  Chief Complaint:left knee pain.  HPI: Shirley Bush, 59 y.o. female, has a history of pain and functional disability in the left knee due to arthritis and has failed non-surgical conservative treatments for greater than 12 weeks to includeNSAID's and/or analgesics, corticosteriod injections and viscosupplementation injections.  Onset of symptoms was gradual, starting 2 years ago with rapidlly worsening course since that time. The patient noted prior procedures on the knee to include  arthroscopy and menisectomy on the left knee(s).  Patient currently rates pain in the left knee(s) at 8 out of 10 with activity. Patient has night pain, worsening of pain with activity and weight bearing, pain that interferes with activities of daily living, pain with passive range of motion, crepitus and joint swelling.  Patient has evidence of subchondral sclerosis and joint space narrowing by imaging studies. There is no active infection.  Patient Active Problem List   Diagnosis Date Noted  . Allergic rhinitis 07/19/2014  . Annual physical exam 02/18/2014  . Left knee pain 02/07/2013  . Back pain with radiation 02/07/2013  . Fibromyalgia 04/05/2012  . Onychomycosis 08/25/2011  . Hyperlipemia 02/11/2011  . UNSPECIFIED MYALGIA AND MYOSITIS 02/10/2010  . CARPAL TUNNEL SYNDROME, LEFT 10/05/2008  . CHEST PAIN 05/04/2008  . FIBROIDS, UTERUS 03/31/2008  . DEPRESSIVE DISORDER NOT ELSEWHERE CLASSIFIED 02/14/2008  . NECK AND BACK PAIN 02/14/2008  . Migraine 10/18/2007  . Essential hypertension 10/18/2007   Past Medical History  Diagnosis Date  . Migraines   . Fibromyalgia   . Medial meniscus tear 04/2013    left  . Hypertension     under control with meds., has been on med. x 4 yr.    Past Surgical History  Procedure Laterality Date  . Tubal ligation  1992  . Cesarean section  1978  . Cesarean section   1980  . Cesarean section  1992  . Umbilical hernia repair  2008  . Shoulder arthroscopy Left 2009  . Shoulder arthroscopy with rotator cuff repair Left 11/25/2004  . Colonoscopy  02/05/2009  . Shoulder arthroscopy Right   . Finger surgery Right     long finger  . Knee arthroscopy with medial menisectomy Left 04/24/2013    Procedure: LEFT KNEE ARTHROSCOPY CHONDROPLASTY WITH MEDIAL MENISECTOMY;  Surgeon: Ninetta Lights, MD;  Location: [facility];  Service: Orthopedics;  Laterality: Left;  . Colonoscopy N/A 03/06/2014    Procedure: COLONOSCOPY;  Surgeon: Danie Binder, MD;  Location: [facility];  Service: Endoscopy;  Laterality: N/A;  10:30 AM     (Not in a hospital admission) No Known Allergies  History  Substance Use Topics  . Smoking status: Never Smoker   . Smokeless tobacco: Never Used  . Alcohol Use: Yes     Comment: occasionally    Family History  Problem Relation Age of Onset  . Hypertension Mother   . Hypertension Father   . Alcohol abuse Father   . Seizures Father   . Migraines Sister   . Colon cancer Sister   . Thyroid disease Sister   . Diabetes Sister   . Cancer Sister      Review of Systems  Constitutional: Negative.   HENT: Negative.   Eyes: Negative.   Respiratory: Negative.   Cardiovascular: Negative.   Gastrointestinal: Negative.   Genitourinary: Negative.   Musculoskeletal: Positive for joint pain.  Skin: Negative.   Neurological: Negative.  Endo/Heme/Allergies: Negative.   Psychiatric/Behavioral: Negative.     Objective:  Physical Exam  Constitutional: She is oriented to person, place, and time. She appears well-developed and well-nourished.  HENT:  Head: Normocephalic and atraumatic.  Eyes: EOM are normal. Pupils are equal, round, and reactive to light.  Neck: Normal range of motion. Neck supple.  Cardiovascular: Normal rate, regular rhythm and normal heart sounds.   Respiratory: Effort normal and breath sounds normal.  No respiratory distress. She has no wheezes. She has no rales.  GI: Soft. Bowel sounds are normal.  Musculoskeletal:  Alert and oriented x 3.  Examination of her left knee reveals trace effusion.  Range of motion 0-90 degrees.  Marked patellofemoral crepitus.  She is neurovascularly intact distally.    Neurological: She is alert and oriented to person, place, and time.  Skin: Skin is warm and dry.  Psychiatric: She has a normal mood and affect. Her behavior is normal. Judgment and thought content normal.    Vital signs in last 24 hours: @VSRANGES @  Labs:   Estimated body mass index is 27.22 kg/(m^2) as calculated from the following:   Height as of 10/20/14: 5' 3.5" (1.613 m).   Weight as of 10/20/14: 70.816 kg (156 lb 1.9 oz).   Imaging Review Plain radiographs demonstrate severe degenerative joint disease of the left knee(s). The overall alignment ismild varus. The bone quality appears to be fair for age and reported activity level.  Assessment/Plan:  End stage arthritis, left knee   The patient history, physical examination, clinical judgment of the provider and imaging studies are consistent with end stage degenerative joint disease of the left knee(s) and total knee arthroplasty is deemed medically necessary. The treatment options including medical management, injection therapy arthroscopy and arthroplasty were discussed at length. The risks and benefits of total knee arthroplasty were presented and reviewed. The risks due to aseptic loosening, infection, stiffness, patella tracking problems, thromboembolic complications and other imponderables were discussed. The patient acknowledged the explanation, agreed to proceed with the plan and consent was signed. Patient is being admitted for inpatient treatment for surgery, pain control, PT, OT, prophylactic antibiotics, VTE prophylaxis, progressive ambulation and ADL's and discharge planning. The patient is planning to be discharged to skilled  nursing facility

## 2014-11-11 NOTE — Progress Notes (Signed)
Report given to Jayse Hodkinson rn as caregiver 

## 2014-11-12 ENCOUNTER — Encounter (HOSPITAL_COMMUNITY): Payer: Self-pay | Admitting: Orthopedic Surgery

## 2014-11-12 LAB — BASIC METABOLIC PANEL
Anion gap: 10 (ref 5–15)
BUN: 9 mg/dL (ref 6–23)
CALCIUM: 8.8 mg/dL (ref 8.4–10.5)
CO2: 24 mmol/L (ref 19–32)
Chloride: 99 mmol/L (ref 96–112)
Creatinine, Ser: 0.79 mg/dL (ref 0.50–1.10)
GFR calc non Af Amer: >90 mL/min (ref >90)
Glucose, Bld: 142 mg/dL — ABNORMAL HIGH (ref 70–99)
POTASSIUM: 4.3 mmol/L (ref 3.5–5.1)
SODIUM: 133 mmol/L — AB (ref 135–145)

## 2014-11-12 LAB — CBC
HEMATOCRIT: 30.1 % — AB (ref 36.0–46.0)
HEMOGLOBIN: 10.5 g/dL — AB (ref 12.0–15.0)
MCH: 31.4 pg (ref 26.0–34.0)
MCHC: 34.9 g/dL (ref 30.0–36.0)
MCV: 90.1 fL (ref 78.0–100.0)
Platelets: 276 10*3/uL (ref 150–400)
RBC: 3.34 MIL/uL — AB (ref 3.87–5.11)
RDW: 12.1 % (ref 11.5–15.5)
WBC: 15.2 10*3/uL — AB (ref 4.0–10.5)

## 2014-11-12 MED ORDER — MEPERIDINE HCL 50 MG PO TABS
50.0000 mg | ORAL_TABLET | ORAL | Status: DC | PRN
  Administered 2014-11-12 – 2014-11-16 (×11): 50 mg via ORAL
  Filled 2014-11-12 (×11): qty 1

## 2014-11-12 MED ORDER — MEPERIDINE HCL 50 MG PO TABS: ORAL_TABLET | ORAL | Status: DC

## 2014-11-12 NOTE — Progress Notes (Signed)
Orthopedic Tech Progress Note Patient Details:  Shirley Bush Nov 14, 1955 [id number] On cpm at 7:10 pm Patient ID: GUNDA MAQUEDA, female   DOB: 06/21/1956, 58 y.o.   MRN: [id number]   Braulio Bosch 11/12/2014, 7:12 PM

## 2014-11-12 NOTE — Evaluation (Signed)
Occupational Therapy Evaluation Patient Details Name: Shirley Bush MRN: [id number] DOB: [birth year] Today's Date: 11/12/2014    History of Present Illness Pt is a 59 y/o F s/p L TKA.  Pt's PMH includes back pain w/ radiation, fibromyalgia, unspecified myalgia and myositis, L carpal tunnel syndrome, chest pain, depressive disorder, and B RC surgery (per pt).   Clinical Impression   Patient independent > mod I PTA. Patient currently functioning at an overall supervision>min assist level. Patient will benefit from acute OT to increase overall independence in the areas of ADLs, functional mobility, and overall safety in order to safely discharge home with intermittent supervision from family .     Follow Up Recommendations  No OT follow up;Supervision - Intermittent    Equipment Recommendations  Other (comment) (AE - reacher, sock aid, LH sponge, LH shoe horn)    Recommendations for Other Services None at this time   Precautions / Restrictions Precautions Precautions: Knee;Fall Precaution Comments: Reviewed no pillow under knee Required Braces or Orthoses: Knee Immobilizer - Left Knee Immobilizer - Left: Other (comment) (not specified in order) Restrictions Weight Bearing Restrictions: Yes LLE Weight Bearing: Weight bearing as tolerated      Mobility Bed Mobility Overal bed mobility: Needs Assistance Bed Mobility: Sit to Supine Sit to supine: Supervision   General bed mobility comments: Use of bed rails  Transfers Overall transfer level: Needs assistance Equipment used: Rolling walker (2 wheeled) Transfers: Sit to/from Stand Sit to Stand: Supervision General transfer comment: supervision for safety and cues for technique and hand placement     Balance Overall balance assessment: Needs assistance Sitting-balance support: No upper extremity supported;Feet supported Sitting balance-Leahy Scale: Good     Standing balance support: Bilateral upper extremity  supported;During functional activity Standing balance-Leahy Scale: Fair    ADL Overall ADL's : Needs assistance/impaired General ADL Comments: Patient unable to reach LLE for LB ADLs. Educated patient on AE to increase independence and patient interested to practice using them. Patient engaged in bed mobility and engaged in functional mobility <> BR for functional toilet transfer on/off elevated toilet seat. Patient close supervision for mobility at this time. Recommending intermittent supervision post acute d/c, patient states her daughters will be there intermittently.     Pertinent Vitals/Pain Pain Assessment: 0-10 Pain Score: 10-Worst pain ever Pain Location: left knee Pain Descriptors / Indicators: Sore Pain Intervention(s): Monitored during session;Repositioned     Hand Dominance Left   Extremity/Trunk Assessment Upper Extremity Assessment Upper Extremity Assessment: Overall WFL for tasks assessed   Lower Extremity Assessment Lower Extremity Assessment: Defer to PT evaluation   Cervical / Trunk Assessment Cervical / Trunk Assessment: Normal   Communication Communication Communication: No difficulties   Cognition Arousal/Alertness: Lethargic;Suspect due to medications Behavior During Therapy: [facility] for tasks assessed/performed Overall Cognitive Status: Within Functional Limits for tasks assessed             Home Living Family/patient expects to be discharged to:: Private residence Living Arrangements: Alone Available Help at Discharge: Family;Available PRN/intermittently (daughter available early mornings and evenings and other daughter works 12 hour shifts) Type of Home: House Home Access: Stairs to enter Technical brewer of Steps: 4 Entrance Stairs-Rails: Left;Right Point [place]: One level     Bathroom Shower/Tub: Corporate investment banker: [place] - single point;Walker - 2 wheels;Bedside commode;Crutches    Additional Comments: Pt says she has family she can call who can come help her if needed  Prior Functioning/Environment Level of Independence: Independent with assistive device(s)        Comments: Used cane "some, not a whole lot"    OT Diagnosis: Generalized weakness;Acute pain   OT Problem List: Decreased strength;Decreased range of motion;Decreased activity tolerance;Impaired balance (sitting and/or standing);Decreased safety awareness;Decreased knowledge of use of DME or AE;Decreased knowledge of precautions;Pain   OT Treatment/Interventions: Self-care/ADL training;Therapeutic exercise;Energy conservation;DME and/or AE instruction;Therapeutic activities;Patient/family education;Balance training    OT Goals(Current goals can be found in the care plan section) Acute Rehab OT Goals Patient Stated Goal: nausea to go away OT Goal Formulation: With patient Time For Goal Achievement: 11/19/14 Potential to Achieve Goals: Good ADL Goals Pt Will Perform Grooming: with modified independence;standing Pt Will Perform Upper Body Bathing: Independently;sitting Pt Will Perform Lower Body Bathing: with modified independence;sit to/from stand Pt Will Perform Upper Body Dressing: Independently;sitting Pt Will Perform Lower Body Dressing: with modified independence;sit to/from stand Pt Will Transfer to Toilet: with modified independence;ambulating;bedside commode Pt Will Perform Toileting - Clothing Manipulation and hygiene: with modified independence;sit to/from stand Pt Will Perform Tub/Shower Transfer: Tub transfer;ambulating;3 in 1;rolling walker;with modified independence  OT Frequency: Min 2X/week   Barriers to D/C: Decreased caregiver support   End of Session Equipment Utilized During Treatment: Rolling walker;Left knee immobilizer CPM Left Knee CPM Left Knee: Off  Activity Tolerance: Patient tolerated treatment well Patient left: in bed;with call bell/phone within reach    Time: 1610-9604 OT Time Calculation (min): 23 min Charges:  OT General Charges $OT Visit: 1 Procedure OT Evaluation $Initial OT Evaluation Tier I: 1 Procedure OT Treatments $Self Care/Home Management : 8-22 mins  Josephus Harriger , MS, OTR/L, CLT Pager: [phone]  11/12/2014, 2:46 PM

## 2014-11-12 NOTE — Progress Notes (Signed)
Subjective: 1 Day Post-Op Procedure(s) (LRB): LEFT TOTAL Bush ARTHROPLASTY (Left) Patient reports pain as severe.  Patient reports marked nausea/vomiting throughout yesterday late afternoon and into this morning.  Patient thinks this is attributed to the oxycodone as she has had a similar reaction in the past.  No lightheadedness/dizziness, chest pain/sob.  Negative flatus/bm.  Decreased appetite.  Objective: Vital signs in last 24 hours: Temp:  [97.4 F (36.3 C)-98.6 F (37 C)] 98.6 F (37 C) (04/28 0617) Pulse Rate:  [58-98] 98 (04/28 0617) Resp:  [8-24] 18 (04/28 0400) BP: (96-134)/(51-84) 134/74 mmHg (04/28 0617) SpO2:  [93 %-100 %] 100 % (04/28 0617) Weight:  [71.487 kg (157 lb 9.6 oz)] 71.487 kg (157 lb 9.6 oz) (04/27 1115)  Intake/Output from previous day: 04/27 0701 - 04/28 0700 In: 1350 [I.V.:1350] Out: 1600 [Urine:750; Emesis/NG output:300; Drains:550] Intake/Output this shift: Total I/O In: -  Out: 1000 [Urine:550; Emesis/NG output:300; Drains:150]  No results for input(s): HGB in the last 72 hours. No results for input(s): WBC, RBC, HCT, PLT in the last 72 hours. No results for input(s): NA, K, CL, CO2, BUN, CREATININE, GLUCOSE, CALCIUM in the last 72 hours. No results for input(s): LABPT, INR in the last 72 hours.  Neurologically intact Neurovascular intact Sensation intact distally Intact pulses distally Dorsiflexion/Plantar flexion intact Compartment soft  hemovac drain pulled by me today Negative homans bilaterally  Assessment/Plan: 1 Day Post-Op Procedure(s) (LRB): LEFT TOTAL Bush ARTHROPLASTY (Left) Advance diet Up with therapy D/C IV fluids Discharge to SNF once insurance approved and patient's pain and nausea/vomiting under control WBAT LLE Dry dressing change prn Will d/c oxycodone and replace with demerol   Shirley Bush 11/12/2014, 6:35 AM

## 2014-11-12 NOTE — Progress Notes (Signed)
Physical Therapy Treatment Patient Details Name: Shirley Bush MRN: [id number] DOB: [birth year] Today's Date: 11/12/2014    History of Present Illness Pt is a 59 y/o F s/p L TKA.  Pt's PMH includes back pain w/ radiation, fibromyalgia, unspecified myalgia and myositis, L carpal tunnel syndrome, chest pain, depressive disorder, and B RC surgery (per pt).    PT Comments    Pt limited with mobility during session due to nausea and lethargy, suspect due to medications. Pt agreeable to perform HEP, but deferred ambulation due to nausea. Anticipate pt will progress well and increase functional independence once nausea has subsided. Continue with POC.  Follow Up Recommendations  Home health PT;Supervision - Intermittent     Equipment Recommendations  None recommended by PT    Recommendations for Other Services       Precautions / Restrictions Precautions Precautions: Knee;Fall Required Braces or Orthoses: Knee Immobilizer - Left Restrictions LLE Weight Bearing: Weight bearing as tolerated    Mobility  Bed Mobility Overal bed mobility: Needs Assistance Bed Mobility: Sit to Supine       Sit to supine: Min assist   General bed mobility comments: Min A for LLE. Pt able to use rails to push up in bed.  Transfers Overall transfer level: Needs assistance Equipment used: Rolling walker (2 wheeled) Transfers: Sit to/from Stand Sit to Stand: Min guard         General transfer comment: Min guard for safety. Cues for hand placement and safety.  Ambulation/Gait             General Gait Details: Unable to attempt ambulation due to nausea.   Stairs            Wheelchair Mobility    Modified Rankin (Stroke Patients Only)       Balance                                    Cognition Arousal/Alertness: Lethargic;Suspect due to medications Behavior During Therapy: [facility] for tasks assessed/performed Overall Cognitive Status: Within Functional Limits  for tasks assessed                      Exercises Total Joint Exercises Quad Sets: AROM;Both;10 reps Heel Slides: AAROM;Left;5 reps Hip ABduction/ADduction: AAROM;Left;10 reps Straight Leg Raises: AAROM;Left;5 reps    General Comments        Pertinent Vitals/Pain Pain Assessment: No/denies pain Pain Intervention(s): Monitored during session;Premedicated before session    Home Living                      Prior Function            PT Goals (current goals can now be found in the care plan section) Progress towards PT goals: Progressing toward goals    Frequency  7X/week    PT Plan Current plan remains appropriate    Co-evaluation             End of Session   Activity Tolerance: Patient limited by fatigue;Patient limited by lethargy;Other (comment) (Pt limited by nausea.) Patient left: in bed;with call bell/phone within reach     Time: 0258-5277 PT Time Calculation (min) (ACUTE ONLY): 26 min  Charges:                       G Codes:      Gerlean Ren [place],  SPTA 11/12/2014, 1:50 PM

## 2014-11-12 NOTE — Progress Notes (Signed)
PT Cancellation Note  Patient Details Name: Shirley Bush MRN: [id number] DOB: 1956-07-13   Cancelled Treatment:    Reason Eval/Treat Not Completed: Other (comment) (WB status not included in PT order).  Awaiting WB status from physician and will complete evaluation once appropriate.  Thank you for this order.   Joslyn Hy PT, DPT [phone] Pager: [phone] 11/12/2014, 8:55 AM

## 2014-11-12 NOTE — Discharge Instructions (Signed)
INSTRUCTIONS AFTER JOINT REPLACEMENT   o Remove items at home which could result in a fall. This includes throw rugs or furniture in walking pathways o ICE to the affected joint every three hours while awake for 30 minutes at a time, for at least the first 3-5 days, and then as needed for pain and swelling.  Continue to use ice for pain and swelling. You may notice swelling that will progress down to the foot and ankle.  This is normal after surgery.  Elevate your leg when you are not up walking on it.   o Continue to use the breathing machine you got in the hospital (incentive spirometer) which will help keep your temperature down.  It is common for your temperature to cycle up and down following surgery, especially at night when you are not up moving around and exerting yourself.  The breathing machine keeps your lungs expanded and your temperature down.  BLOOD THINNER:  TAKE ELIQUIS AS DIRECTED FOR A TOTAL OF 12 DAYS FOLLOWING SURGERY TO PREVENT BLOOD CLOTS  DIET:  As you were doing prior to hospitalization, we recommend a well-balanced diet.  DRESSING / WOUND CARE / SHOWERING  You may change your dressing 3-5 days after surgery.  Then change the dressing every day with sterile gauze.  Please use good hand washing techniques before changing the dressing.  Do not use any lotions or creams on the incision until instructed by your surgeon. and You may shower 3 days after surgery, but keep the wounds dry during showering.  You may use an occlusive plastic wrap (Press'n Seal for example), NO SOAKING/SUBMERGING IN THE BATHTUB.  If the bandage gets wet, change with a clean dry gauze.  If the incision gets wet, pat the wound dry with a clean towel.  ACTIVITY  o Increase activity slowly as tolerated, but follow the weight bearing instructions below.   o No driving for 6 weeks or until further direction given by your physician.  You cannot drive while taking narcotics.  o No lifting or carrying greater  than 10 lbs. until further directed by your surgeon. o Avoid periods of inactivity such as sitting longer than an hour when not asleep. This helps prevent blood clots.  o You may return to work once you are authorized by your doctor.     WEIGHT BEARING   Weight bearing as tolerated with assist device (walker, cane, etc) as directed, use it as long as suggested by your surgeon or therapist, typically at least 4-6 weeks.   EXERCISES  Results after joint replacement surgery are often greatly improved when you follow the exercise, range of motion and muscle strengthening exercises prescribed by your doctor. Safety measures are also important to protect the joint from further injury. Any time any of these exercises cause you to have increased pain or swelling, decrease what you are doing until you are comfortable again and then slowly increase them. If you have problems or questions, call your caregiver or physical therapist for advice.   Rehabilitation is important following a joint replacement. After just a few days of immobilization, the muscles of the leg can become weakened and shrink (atrophy).  These exercises are designed to build up the tone and strength of the thigh and leg muscles and to improve motion. Often times heat used for twenty to thirty minutes before working out will loosen up your tissues and help with improving the range of motion but do not use heat for the first two  weeks following surgery (sometimes heat can increase post-operative swelling).   These exercises can be done on a training (exercise) mat, on the floor, on a table or on a bed. Use whatever works the best and is most comfortable for you.    Use music or television while you are exercising so that the exercises are a pleasant break in your day. This will make your life better with the exercises acting as a break in your routine that you can look forward to.   Perform all exercises about fifteen times, three times per  day or as directed.  You should exercise both the operative leg and the other leg as well.  Exercises include:    Quad Sets - Tighten up the muscle on the front of the thigh (Quad) and hold for 5-10 seconds.    Straight Leg Raises - With your knee straight (if you were given a brace, keep it on), lift the leg to 60 degrees, hold for 3 seconds, and slowly lower the leg.  Perform this exercise against resistance later as your leg gets stronger.   Leg Slides: Lying on your back, slowly slide your foot toward your buttocks, bending your knee up off the floor (only go as far as is comfortable). Then slowly slide your foot back down until your leg is flat on the floor again.   Angel Wings: Lying on your back spread your legs to the side as far apart as you can without causing discomfort.   Hamstring Strength:  Lying on your back, push your heel against the floor with your leg straight by tightening up the muscles of your buttocks.  Repeat, but this time bend your knee to a comfortable angle, and push your heel against the floor.  You may put a pillow under the heel to make it more comfortable if necessary.   A rehabilitation program following joint replacement surgery can speed recovery and prevent re-injury in the future due to weakened muscles. Contact your doctor or a physical therapist for more information on knee rehabilitation.    CONSTIPATION  Constipation is defined medically as fewer than three stools per week and severe constipation as less than one stool per week.  Even if you have a regular bowel pattern at home, your normal regimen is likely to be disrupted due to multiple reasons following surgery.  Combination of anesthesia, postoperative narcotics, change in appetite and fluid intake all can affect your bowels.   YOU MUST use at least one of the following options; they are listed in order of increasing strength to get the job done.  They are all available over the counter, and you may  need to use some, POSSIBLY even all of these options:    Drink plenty of fluids (prune juice may be helpful) and high fiber foods Colace 100 mg by mouth twice a day  Senokot for constipation as directed and as needed Dulcolax (bisacodyl), take with full glass of water  Miralax (polyethylene glycol) once or twice a day as needed.  If you have tried all these things and are unable to have a bowel movement in the first 3-4 days after surgery call either your surgeon or your primary doctor.    If you experience loose stools or diarrhea, hold the medications until you stool forms back up.  If your symptoms do not get better within 1 week or if they get worse, check with your doctor.  If you experience "the worst abdominal pain ever" or  develop nausea or vomiting, please contact the office immediately for further recommendations for treatment.   ITCHING:  If you experience itching with your medications, try taking only a single pain pill, or even half a pain pill at a time.  You can also use Benadryl over the counter for itching or also to help with sleep.   TED HOSE STOCKINGS:  Use stockings on both legs until for at least 2 weeks or as directed by physician office. They may be removed at night for sleeping.  MEDICATIONS:  See your medication summary on the After Visit Summary that nursing will review with you.  You may have some home medications which will be placed on hold until you complete the course of blood thinner medication.  It is important for you to complete the blood thinner medication as prescribed.  PRECAUTIONS:  If you experience chest pain or shortness of breath - call 911 immediately for transfer to the hospital emergency department.   If you develop a fever greater that 101 F, purulent drainage from wound, increased redness or drainage from wound, foul odor from the wound/dressing, or calf pain - CONTACT YOUR SURGEON.                                                   FOLLOW-UP  APPOINTMENTS:  If you do not already have a post-op appointment, please call the office for an appointment to be seen by your surgeon.  Guidelines for how soon to be seen are listed in your After Visit Summary, but are typically between 1-4 weeks after surgery.  OTHER INSTRUCTIONS:   Knee Replacement:  Do not place pillow under knee, focus on keeping the knee straight while resting. CPM instructions: 0-90 degrees, 2 hours in the morning, 2 hours in the afternoon, and 2 hours in the evening. Place foam block, curve side up under heel at all times except when in CPM or when walking.  DO NOT modify, tear, cut, or change the foam block in any way.  MAKE SURE YOU:   Understand these instructions.   Get help right away if you are not doing well or get worse.    Thank you for letting us be a part of your medical care team.  It is a privilege we respect greatly.  We hope these instructions will help you stay on track for a fast and full recovery!   ---------------------------------------------------------------------------------------------------------------------------- Information on my medicine - ELIQUIS (apixaban)  This medication education was reviewed with me or my healthcare representative as part of my discharge preparation.  The pharmacist that spoke with me during my hospital stay was:  Dareen Piano, [facility] [place]  Why was Eliquis prescribed for you? Eliquis was prescribed for you to reduce the risk of blood clots forming after orthopedic surgery.    What do You need to know about Eliquis? Take your Eliquis TWICE DAILY - one tablet in the morning and one tablet in the evening with or without food.  It would be best to take the dose about the same time each day.  If you have difficulty swallowing the tablet whole please discuss with your pharmacist how to take the medication safely.  Take Eliquis exactly as prescribed by your doctor and DO NOT stop taking Eliquis without talking to  the doctor who prescribed the medication.  Stopping without  other medication to take the place of Eliquis may increase your risk of developing a clot.  After discharge, you should have regular check-up appointments with your healthcare provider that is prescribing your Eliquis.  What do you do if you miss a dose? If a dose of ELIQUIS is not taken at the scheduled time, take it as soon as possible on the same day and twice-daily administration should be resumed.  The dose should not be doubled to make up for a missed dose.  Do not take more than one tablet of ELIQUIS at the same time.  Important Safety Information A possible side effect of Eliquis is bleeding. You should call your healthcare provider right away if you experience any of the following: ? Bleeding from an injury or your nose that does not stop. ? Unusual colored urine (red or dark brown) or unusual colored stools (red or black). ? Unusual bruising for unknown reasons. ? A serious fall or if you hit your head (even if there is no bleeding).  Some medicines may interact with Eliquis and might increase your risk of bleeding or clotting while on Eliquis. To help avoid this, consult your healthcare provider or pharmacist prior to using any new prescription or non-prescription medications, including herbals, vitamins, non-steroidal anti-inflammatory drugs (NSAIDs) and supplements.  This website has more information on Eliquis (apixaban): http://www.eliquis.com/eliquis/home

## 2014-11-12 NOTE — Evaluation (Signed)
Physical Therapy Evaluation Patient Details Name: Shirley Bush MRN: [id number] DOB: 1955/10/09 Today's Date: 11/12/2014   History of Present Illness  Pt is a 59 y/o F s/p L TKA.  Pt's PMH includes back pain w/ radiation, fibromyalgia, unspecified myalgia and myositis, L carpal tunnel syndrome, chest pain, depressive disorder, and B RC surgery (per pt).  Clinical Impression  Pt is s/p L TKA resulting in the deficits listed below (see PT Problem List).  Pt demonstrated ability to ambulate 120 ft and ascend/descend 4 steps this session. Pt will benefit from skilled PT to increase their independence and safety with mobility to allow discharge to the venue listed below.     Follow Up Recommendations Home health PT;Supervision - Intermittent    Equipment Recommendations  None recommended by PT    Recommendations for Other Services       Precautions / Restrictions Precautions Precautions: Fall;Knee Precaution Booklet Issued: Yes (comment) Precaution Comments: Reviewed no pillow under knee Required Braces or Orthoses: Knee Immobilizer - Left Knee Immobilizer - Left: Other (comment) (no specific order) Restrictions Weight Bearing Restrictions: Yes LLE Weight Bearing: Weight bearing as tolerated      Mobility  Bed Mobility               General bed mobility comments: in recliner  Transfers Overall transfer level: Needs assistance Equipment used: Rolling walker (2 wheeled) Transfers: Sit to/from Stand Sit to Stand: Min guard         General transfer comment: Cues for hand placement, sequencing, and to stand upright by pushing through BLEs  Ambulation/Gait Ambulation/Gait assistance: Min guard Ambulation Distance (Feet): 120 Feet Assistive device: Rolling walker (2 wheeled) Gait Pattern/deviations: Step-to pattern;Step-through pattern;Decreased stride length;Decreased stance time - left;Antalgic;Trunk flexed   Gait velocity interpretation: Below normal speed for  age/gender General Gait Details: Pt w/ min L hip hike likely 2/2 KI. Pt self corrected trunk flexion while ambulating.  Verbal cues for sequencing w/ RW  Stairs Stairs: Yes Stairs assistance: Min guard Stair Management: One rail Left;Step to pattern;Sideways Number of Stairs: 2 (x2) General stair comments: Verbal cues and demonstration for proper sequencing  Wheelchair Mobility    Modified Rankin (Stroke Patients Only)       Balance                                             Pertinent Vitals/Pain Pain Assessment: 0-10 Pain Score: 10-Worst pain ever Pain Location: L knee Pain Descriptors / Indicators: Sharp;Dull Pain Intervention(s): Limited activity within patient's tolerance;Monitored during session;Repositioned    Home Living Family/patient expects to be discharged to:: Private residence Living Arrangements: Alone Available Help at Discharge: Family (daughter during the day on and off throughout week) Type of Home: House Home Access: Stairs to enter Entrance Stairs-Rails: Left;Right (can't reach both) Entrance Stairs-Number of Steps: 4 Home Layout: One level Home Equipment: Cane - single point;Walker - 2 wheels;Bedside commode;Crutches Additional Comments: Pt says she has family she can call who can come help her if needed    Prior Function Level of Independence: Independent with assistive device(s)         Comments: Used cane at all times     Hand Dominance   Dominant Hand: Left    Extremity/Trunk Assessment               Lower Extremity Assessment: LLE deficits/detail  LLE Deficits / Details: weakness and limited ROM as expected s/p L TKA  Cervical / Trunk Assessment: Normal  Communication   Communication: No difficulties  Cognition Arousal/Alertness: Awake/alert Behavior During Therapy: WFL for tasks assessed/performed Overall Cognitive Status: Within Functional Limits for tasks assessed                       General Comments General comments (skin integrity, edema, etc.): Pt w/ productive vomiting prior to mobility sitting in recliner.  Pt denied nausea throughout the remainder of the session    Exercises Total Joint Exercises Ankle Circles/Pumps: AROM;Both;10 reps;Seated Quad Sets: AROM;Both;5 reps;Seated Heel Slides: AROM;Left;5 reps;Supine Knee Flexion: AROM;Left;5 reps;Seated Goniometric ROM: 4-135      Assessment/Plan    PT Assessment Patient needs continued PT services  PT Diagnosis Difficulty walking;Abnormality of gait;Generalized weakness;Acute pain   PT Problem List Decreased strength;Decreased range of motion;Decreased activity tolerance;Decreased balance;Decreased mobility;Decreased coordination;Decreased knowledge of use of DME;Decreased safety awareness;Decreased knowledge of precautions;Decreased skin integrity;Pain  PT Treatment Interventions DME instruction;Gait training;Stair training;Functional mobility training;Therapeutic activities;Therapeutic exercise;Balance training;Neuromuscular re-education;Patient/family education;Modalities   PT Goals (Current goals can be found in the Care Plan section) Acute Rehab PT Goals Patient Stated Goal: to go home tomorrow and to get stronger PT Goal Formulation: With patient/family Time For Goal Achievement: 11/19/14 Potential to Achieve Goals: Good    Frequency 7X/week   Barriers to discharge Inaccessible home environment;Decreased caregiver support steps to enter home and limited caregiver assist    Co-evaluation               End of Session Equipment Utilized During Treatment: Gait belt;Left knee immobilizer Activity Tolerance: Patient tolerated treatment well Patient left: in chair;with call bell/phone within reach;with family/visitor present Nurse Communication: Mobility status;Precautions;Weight bearing status         Time: 8756-4332 PT Time Calculation (min) (ACUTE ONLY): 41 min   Charges:   PT  Evaluation $Initial PT Evaluation Tier I: 1 Procedure PT Treatments $Gait Training: 8-22 mins $Therapeutic Exercise: 8-22 mins   PT G Codes:        Joslyn Hy PT, DPT [phone] Pager: [phone] 11/12/2014, 11:55 AM

## 2014-11-13 LAB — BASIC METABOLIC PANEL
ANION GAP: 7 (ref 5–15)
BUN: 6 mg/dL (ref 6–23)
CALCIUM: 9.2 mg/dL (ref 8.4–10.5)
CO2: 28 mmol/L (ref 19–32)
CREATININE: 0.76 mg/dL (ref 0.50–1.10)
Chloride: 98 mmol/L (ref 96–112)
GFR calc Af Amer: >90 mL/min (ref >90)
GLUCOSE: 127 mg/dL — AB (ref 70–99)
Potassium: 4.6 mmol/L (ref 3.5–5.1)
SODIUM: 133 mmol/L — AB (ref 135–145)

## 2014-11-13 LAB — CBC
HCT: 28.2 % — ABNORMAL LOW (ref 36.0–46.0)
HEMOGLOBIN: 10.1 g/dL — AB (ref 12.0–15.0)
MCH: 31 pg (ref 26.0–34.0)
MCHC: 35.8 g/dL (ref 30.0–36.0)
MCV: 86.5 fL (ref 78.0–100.0)
PLATELETS: 268 10*3/uL (ref 150–400)
RBC: 3.26 MIL/uL — AB (ref 3.87–5.11)
RDW: 11.8 % (ref 11.5–15.5)
WBC: 16.4 10*3/uL — AB (ref 4.0–10.5)

## 2014-11-13 NOTE — Progress Notes (Signed)
Physical Therapy Treatment Patient Details Name: Shirley Bush MRN: [id number] DOB: 1955-10-24 Today's Date: 11/13/2014    History of Present Illness Pt is a 59 y/o F s/p L TKA.  Pt's PMH includes back pain w/ radiation, fibromyalgia, unspecified myalgia and myositis, L carpal tunnel syndrome, chest pain, depressive disorder, and B RC surgery (per pt).    PT Comments    Patient is making good progress with PT.  From a mobility standpoint anticipate patient will be ready for DC today.  Pt does not have assist at home for the majority of the time as pt's daughter works night shift and will not be home each day.  Pt has expressed interest in SNF upon d/c 2/2 decreased assist and to increase safety.     Follow Up Recommendations  Home health PT;Supervision - Intermittent;SNF     Equipment Recommendations  None recommended by PT    Recommendations for Other Services       Precautions / Restrictions Precautions Precautions: Knee;Fall Precaution Comments: Reviewed no pillow under knee Required Braces or Orthoses: Knee Immobilizer - Left Knee Immobilizer - Left: Other (comment) (not specified in order) Restrictions Weight Bearing Restrictions: Yes LLE Weight Bearing: Weight bearing as tolerated    Mobility  Bed Mobility               General bed mobility comments: in recliner  Transfers Overall transfer level: Needs assistance Equipment used: Rolling walker (2 wheeled) Transfers: Sit to/from Stand Sit to Stand: Supervision         General transfer comment: supervision for safety  Ambulation/Gait Ambulation/Gait assistance: Supervision Ambulation Distance (Feet): 200 Feet Assistive device: Rolling walker (2 wheeled) Gait Pattern/deviations: Step-through pattern;Antalgic;Trunk flexed;Decreased stride length;Decreased stance time - left   Gait velocity interpretation: Below normal speed for age/gender General Gait Details: slight L hip hike likely 2/2 L  KI   Stairs            Wheelchair Mobility    Modified Rankin (Stroke Patients Only)       Balance Overall balance assessment: Needs assistance Sitting-balance support: No upper extremity supported;Feet supported Sitting balance-Leahy Scale: Good     Standing balance support: No upper extremity supported;During functional activity (putting on gown w/o BUEs supported) Standing balance-Leahy Scale: Good                      Cognition Arousal/Alertness: Awake/alert Behavior During Therapy: WFL for tasks assessed/performed Overall Cognitive Status: Within Functional Limits for tasks assessed                      Exercises Total Joint Exercises Quad Sets: AROM;Both;5 reps;Seated Hip ABduction/ADduction: AROM;Left;10 reps;Seated Straight Leg Raises: AROM;Left;10 reps;Seated Long Arc Quad: AROM;Left;10 reps;Seated Knee Flexion: AROM;Left;5 reps;Seated Goniometric ROM: 0-96    General Comments        Pertinent Vitals/Pain Pain Assessment: 0-10 Pain Score: 5  Pain Location: L knee Pain Descriptors / Indicators: Aching Pain Intervention(s): Limited activity within patient's tolerance;Monitored during session;Repositioned    Home Living                      Prior Function            PT Goals (current goals can now be found in the care plan section) Acute Rehab PT Goals PT Goal Formulation: With patient/family Time For Goal Achievement: 11/19/14 Potential to Achieve Goals: Good Progress towards PT goals: Progressing toward goals  Frequency  7X/week    PT Plan Current plan remains appropriate    Co-evaluation             End of Session Equipment Utilized During Treatment: Gait belt;Left knee immobilizer Activity Tolerance: Patient limited by fatigue;Patient tolerated treatment well Patient left: in chair;with call bell/phone within reach     Time: 0758-0818 PT Time Calculation (min) (ACUTE ONLY): 20 min  Charges:   $Gait Training: 8-22 mins                    G Codes:      Joslyn Hy PT, [place] [phone] Pager: [phone] 11/13/2014, 8:36 AM

## 2014-11-13 NOTE — Clinical Social Work Note (Signed)
CSW received notification that the insurance adjuster for this patient's worker's comp will not be back in the office until Monday.  No other adjuster is approved to work this case.  Worker's comp rep, Sonia Baller, states cannot proceed with SNF placement until adjuster gives permission. Staff updated.  Nonnie Done, LCSW [phone]  Psychiatric & Orthopedics (5N 1-8) Clinical Social Worker

## 2014-11-13 NOTE — Clinical Social Work Note (Signed)
Clinical Social Work Assessment  Patient Details  Name: Shirley Bush MRN: [id number] Date of Birth: [birth year]  Date of referral:  11/13/14               Reason for consult:  Facility Placement                Permission sought to share information with:  Chartered certified accountant granted to share information::  Yes, Verbal Permission Granted  Name::        Agency::   (all agencies in [place])  Relationship::     Contact Information:     Housing/Transportation Living arrangements for the past 2 months:  Single Family Home Source of Information:  Patient Patient Interpreter Needed:  None Criminal Activity/Legal Involvement Pertinent to Current Situation/Hospitalization:  No - Comment as needed Significant Relationships:  Adult Children, Parents Lives with:  Self Do you feel safe going back to the place where you live?  No (patient feels she would be a fall risk and requests to go to SNF at time of dc) Need for family participation in patient care:     Care giving concerns:  No caregivers assessed or at bedside during the course of this assessment   Social Worker assessment / plan:  CSW assessed patient at bedside. Patient had knee surgery and is filing a claim with worker's compensation.  Her Worker's Comp rep is Lucienne Capers and can be reached at [phone].  PT is recommending HH with intermittent supervision though patient lives at home alone and is requesting to be dc'd to SNF.  This information has been relayed to worker's comp rep.  RNCM and CSW are awaiting a response from [facility] rep.  Patient aware.  Employment status:  [employer] information:  Other (Comment Required) (Worker's Comp) PT Recommendations:  Home with Home Health (Intermittent Supervision) Information / Referral to community resources:  Acute Rehab  Patient/Family's Response to care:  Patient is appreciative of CSW assistance and support during her hospital  admission.  Patient/Family's Understanding of and Emotional Response to Diagnosis, Current Treatment, and Prognosis:  Patient is realistic regarding her prognosis and recovery.  Patient is apprehensive to return home alone as she feels she is a fall risk  Emotional Assessment Appearance:  Appears stated age Attitude/Demeanor/Rapport:   (Appropriate) Affect (typically observed):  Accepting, Calm, Stable, Pleasant, Hopeful Orientation:  Oriented to Self, Oriented to Place, Oriented to  Time, Oriented to Situation Alcohol / Substance use:  Not Applicable Psych involvement (Current and /or in the community):  No (Comment)  Discharge Needs  Concerns to be addressed:  No discharge needs identified Readmission within the last 30 days:  No Current discharge risk:  None Barriers to Discharge:  Other (Worker's Comp approval)   Dulcy Fanny, LCSW 11/13/2014, 11:01 AM

## 2014-11-13 NOTE — Progress Notes (Signed)
Subjective: 2 Days Post-Op Procedure(s) (LRB): LEFT TOTAL KNEE ARTHROPLASTY (Left) Patient reports pain as mild.  Nausea/vomiting has subsided and patient feeling much better this am.  No lightheadedness/dizziness, chest pain/sob.  Positive flatus but no bm as of yet.  Tolerating diet.  Objective: Vital signs in last 24 hours: Temp:  [97.4 F (36.3 C)-98.6 F (37 C)] 98.2 F (36.8 C) (04/29 0704) Pulse Rate:  [80-106] 80 (04/29 0704) Resp:  [16-18] 16 (04/29 0704) BP: (129-148)/(73-79) 129/73 mmHg (04/29 0704) SpO2:  [97 %-100 %] 100 % (04/29 0704)  Intake/Output from previous day: 04/28 0701 - 04/29 0700 In: 3885 [P.O.:480; I.V.:3405] Out: 300 [Urine:300] Intake/Output this shift:     Recent Labs  11/12/14 0600 11/13/14 0410  HGB 10.5* 10.1*    Recent Labs  11/12/14 0600 11/13/14 0410  WBC 15.2* 16.4*  RBC 3.34* 3.26*  HCT 30.1* 28.2*  PLT 276 268    Recent Labs  11/12/14 0600 11/13/14 0410  NA 133* 133*  K 4.3 4.6  CL 99 98  CO2 24 28  BUN 9 6  CREATININE 0.79 0.76  GLUCOSE 142* 127*  CALCIUM 8.8 9.2   No results for input(s): LABPT, INR in the last 72 hours.  Neurologically intact Neurovascular intact Sensation intact distally Intact pulses distally Dorsiflexion/Plantar flexion intact Incision: dressing C/D/I No cellulitis present Compartment soft  Negative homans bilaterally Dressing changed by me today  Assessment/Plan: 2 Days Post-Op Procedure(s) (LRB): LEFT TOTAL KNEE ARTHROPLASTY (Left) Advance diet Up with therapy Discharge to SNF TODAY WBAT LLE ABLA-mild and stable Dry dressing change prn  Fannie Knee 11/13/2014, 7:31 AM

## 2014-11-13 NOTE — Clinical Social Work Placement (Signed)
   CLINICAL SOCIAL WORK PLACEMENT  NOTE  Date:  11/13/2014  Patient Details  Name: Shirley Bush MRN: [id number] Date of Birth: 1955/08/18  Clinical Social Work is seeking post-discharge placement for this patient at the [place] level of care (*CSW will initial, date and re-position this form in  chart as items are completed):  Yes   Patient/family provided with [facility]'s list of facilities offering this level of care within the geographic area requested by the patient (or if unable, by the patient's family).  Yes   Patient/family informed of their freedom to choose among providers that offer the needed level of care, that participate in Medicare, Medicaid or managed care program needed by the patient, have an available bed and are willing to accept the patient.  Yes   Patient/family informed of [place]'s ownership interest in [facility] and [facility], as well as of the fact that they are under no obligation to receive care at these facilities.  PASRR submitted to EDS on 11/13/14     PASRR number received on 11/13/14     Existing PASRR number confirmed on       FL2 transmitted to all facilities in geographic area requested by pt/family on 11/13/14     FL2 transmitted to all facilities within larger geographic area on       Patient informed that his/her managed care company has contracts with or will negotiate with certain facilities, including the following:        Yes   Patient/family informed of bed offers received.  Patient chooses bed at  (Requests Facility in [place] only)     Physician recommends and patient chooses bed at      Patient to be transferred to   on  .  Patient to be transferred to facility by       Patient family notified on   of transfer.  Name of family member notified:  patient is alert and oriented x4  and states will update family as necessary     PHYSICIAN Please prepare  priority discharge summary, including medications, Please sign FL2, Please prepare prescriptions     Additional Comment:    _______________________________________________ Dulcy Fanny, LCSW 11/13/2014, 11:04 AM

## 2014-11-13 NOTE — Op Note (Signed)
NAME:  Shirley Bush, Shirley Bush NO.:  [id number]  MEDICAL RECORD NO.:  [id number]  LOCATION:  5N06C                        FACILITY:  [facility]  PHYSICIAN:  Ninetta Lights, M.D. DATE OF BIRTH:  May 31, 1956  DATE OF PROCEDURE:  11/11/2014 DATE OF DISCHARGE:                              OPERATIVE REPORT   PREOPERATIVE DIAGNOSIS:  End-stage arthritis, left knee, secondary posttraumatic.  POSTOPERATIVE DIAGNOSE:  End-stage arthritis, left knee, secondary posttraumatic.  PROCEDURE:  Left knee modified minimally invasive total knee replacement, Stryker Triathlon prosthesis.  Cemented pegged cruciate retaining #3 femoral component.  Cemented #3 tibial component, 9 mm CS insert.  Cemented resurfacing 32-mm patellar component.  Soft tissue balancing.  SURGEON:  Ninetta Lights, M.D.  ASSISTANT:  Elmyra Ricks, P.A., present throughout the entire case and necessary for timely completion of procedure.  ANESTHESIA:  Spinal.  BLOOD LOSS:  Minimal.  SPECIMENS:  None.  CULTURES:  None.  COMPLICATIONS:  None.  DRESSINGS:  Soft compressive, knee immobilizer.  DRAINS:  Hemovac x1.  TOURNIQUET TIME:  1 hour.  PROCEDURE IN DETAIL:  The patient was brought to the operating room, placed on the operating table in supine position.  After adequate anesthesia had been obtained, tourniquet applied.  Prepped and draped in usual sterile fashion.  Exsanguinated with elevation of Esmarch. Tourniquet inflated to 350 mmHg.  Straight incision above the patella down the tibial tubercle.  Medial arthrotomy, vastus splitting, preserving quad tendon.  Distal femur exposed.  An 8-mm resection. Flexible intramedullary guide.  Using epicondylar axis, the femur was sized, cut, and fitted for a pegged cruciate retaining #3 component. Extramedullary guide, proximal tibia.  A 3-degree posterior slope cut. Size #3 component.  Nicely balanced in flexion and extension.  Patella exposed, posterior  10 mm removed.  Drilled, sized, and fitted for a 32- mm component.  Trials put in place.  A 9-mm insert.  With this construct, I was pleased with the motion, stability, balancing, and patellar tracking.  Tibia was marked for rotation and hand reamed.  All trials removed.  Copious irrigation with a pulse irrigating device. Cement prepared, placed on all components, firmly seated.  Polyethylene attached to tibia, knee reduced.  Patella held with a clamp.  Once cement hardened, the knee was irrigated again.  Soft tissues injected with Exparel.  Hemovac placed.  Arthrotomy closed with Ethibond. Subcutaneous and subcuticular closure.  Margins were injected with Marcaine.  Sterile compressive dressing applied.  Tourniquet deflated and removed.  Knee immobilizer applied.  Anesthesia reversed.  Brought to the recovery room.  Tolerated the surgery well.  No complications.     Ninetta Lights, M.D.     DFM/MEDQ  D:  11/12/2014  T:  11/13/2014  Job:  [id number]

## 2014-11-14 LAB — CBC
HEMATOCRIT: 29 % — AB (ref 36.0–46.0)
Hemoglobin: 10.2 g/dL — ABNORMAL LOW (ref 12.0–15.0)
MCH: 31 pg (ref 26.0–34.0)
MCHC: 35.2 g/dL (ref 30.0–36.0)
MCV: 88.1 fL (ref 78.0–100.0)
PLATELETS: 268 10*3/uL (ref 150–400)
RBC: 3.29 MIL/uL — ABNORMAL LOW (ref 3.87–5.11)
RDW: 12.1 % (ref 11.5–15.5)
WBC: 10.2 10*3/uL (ref 4.0–10.5)

## 2014-11-14 LAB — BASIC METABOLIC PANEL
Anion gap: 10 (ref 5–15)
BUN: 9 mg/dL (ref 6–23)
CALCIUM: 9.4 mg/dL (ref 8.4–10.5)
CO2: 28 mmol/L (ref 19–32)
Chloride: 98 mmol/L (ref 96–112)
Creatinine, Ser: 0.78 mg/dL (ref 0.50–1.10)
GFR calc non Af Amer: >90 mL/min (ref >90)
GLUCOSE: 105 mg/dL — AB (ref 70–99)
Potassium: 4.3 mmol/L (ref 3.5–5.1)
Sodium: 136 mmol/L (ref 135–145)

## 2014-11-14 MED ORDER — SODIUM CHLORIDE 0.9 % IJ SOLN
3.0000 mL | Freq: Two times a day (BID) | INTRAMUSCULAR | Status: DC
  Administered 2014-11-14 – 2014-11-15 (×3): 3 mL via INTRAVENOUS

## 2014-11-14 MED ORDER — SODIUM CHLORIDE 0.9 % IJ SOLN: 3.0000 mL | INTRAMUSCULAR | Status: DC | PRN

## 2014-11-14 NOTE — Progress Notes (Signed)
Physical Therapy Treatment Patient Details Name: CIAIRA NATIVIDAD MRN: [id number] DOB: 1955/12/04 Today's Date: 11/14/2014    History of Present Illness Pt is a 59 y/o F s/p L TKA.  Pt's PMH includes back pain w/ radiation, fibromyalgia, unspecified myalgia and myositis, L carpal tunnel syndrome, chest pain, depressive disorder, and B RC surgery (per pt).    PT Comments    Pt progressing well. Pt ambulated without KI 250 feet with RW. No knee buckling noted.  Follow Up Recommendations  Home health PT;Supervision - Intermittent;SNF     Equipment Recommendations  None recommended by PT    Recommendations for Other Services       Precautions / Restrictions Precautions Precautions: Knee;Fall Restrictions LLE Weight Bearing: Weight bearing as tolerated    Mobility  Bed Mobility               General bed mobility comments: Pt in recliner upon PT arrival.  Transfers   Equipment used: Rolling walker (2 wheeled) Transfers: Sit to/from Stand Sit to Stand: Supervision         General transfer comment: verbal cues for sequencing  Ambulation/Gait Ambulation/Gait assistance: Supervision Ambulation Distance (Feet): 250 Feet Assistive device: Rolling walker (2 wheeled) Gait Pattern/deviations: Step-through pattern;Decreased stride length;Antalgic   Gait velocity interpretation: Below normal speed for age/gender General Gait Details: Ambulated without KI. No difficulty or knee buckling noted.   Stairs            Wheelchair Mobility    Modified Rankin (Stroke Patients Only)       Balance                                    Cognition Arousal/Alertness: Awake/alert Behavior During Therapy: WFL for tasks assessed/performed Overall Cognitive Status: Within Functional Limits for tasks assessed                      Exercises Total Joint Exercises Ankle Circles/Pumps: AROM;Both;10 reps Quad Sets: AROM;Left;10 reps Heel Slides:  AAROM;Left;10 reps Hip ABduction/ADduction: AROM;Left;10 reps Straight Leg Raises: AROM;Left;5 reps Goniometric ROM: 0-90 AAROM L knee in long sitting    General Comments        Pertinent Vitals/Pain Pain Assessment: 0-10 Pain Score: 5  Pain Location: L knee Pain Descriptors / Indicators: Aching Pain Intervention(s): Monitored during session;Repositioned    Home Living                      Prior Function            PT Goals (current goals can now be found in the care plan section) Progress towards PT goals: Progressing toward goals    Frequency  7X/week    PT Plan Current plan remains appropriate    Co-evaluation             End of Session Equipment Utilized During Treatment: Gait belt Activity Tolerance: Patient tolerated treatment well Patient left: in chair;with call bell/phone within reach     Time: 0820-0845 PT Time Calculation (min) (ACUTE ONLY): 25 min  Charges:  $Gait Training: 8-22 mins $Therapeutic Exercise: 8-22 mins                    G Codes:      [name] 11/14/2014, 9:26 AM

## 2014-11-14 NOTE — Progress Notes (Signed)
Physical Therapy Treatment Patient Details Name: TABRIA STEINES MRN: [id number] DOB: 30-Apr-1956 Today's Date: 11/14/2014    History of Present Illness Pt is a 59 y/o F s/p L TKA.  Pt's PMH includes back pain w/ radiation, fibromyalgia, unspecified myalgia and myositis, L carpal tunnel syndrome, chest pain, depressive disorder, and B RC surgery (per pt).    PT Comments    Continue per POC.  Follow Up Recommendations  SNF;Supervision/Assistance - 24 hour     Equipment Recommendations  None recommended by PT    Recommendations for Other Services       Precautions / Restrictions Precautions Precautions: Knee;Fall Restrictions LLE Weight Bearing: Weight bearing as tolerated    Mobility  Bed Mobility               General bed mobility comments: Pt in recliner upon PT arrival.  Transfers Overall transfer level: Needs assistance Equipment used: Rolling walker (2 wheeled)   Sit to Stand: Supervision         General transfer comment: verbal cues for sequencing  Ambulation/Gait Ambulation/Gait assistance: Supervision Ambulation Distance (Feet): 250 Feet Assistive device: Rolling walker (2 wheeled) Gait Pattern/deviations: Step-through pattern;Antalgic;Decreased stride length   Gait velocity interpretation: Below normal speed for age/gender General Gait Details: Ambulated without KI. No difficulty or knee buckling noted.   Stairs            Wheelchair Mobility    Modified Rankin (Stroke Patients Only)       Balance                                    Cognition Arousal/Alertness: Awake/alert Behavior During Therapy: WFL for tasks assessed/performed Overall Cognitive Status: Within Functional Limits for tasks assessed                      Exercises Total Joint Exercises Ankle Circles/Pumps: AROM;15 reps;Both    General Comments        Pertinent Vitals/Pain Pain Assessment: 0-10 Pain Score: 4  Pain Location: L  knee Pain Descriptors / Indicators: Pressure Pain Intervention(s): Monitored during session;Ice applied    Home Living                      Prior Function            PT Goals (current goals can now be found in the care plan section) Progress towards PT goals: Progressing toward goals    Frequency  7X/week    PT Plan Current plan remains appropriate    Co-evaluation             End of Session Equipment Utilized During Treatment: Gait belt Activity Tolerance: Patient tolerated treatment well Patient left: in chair;with call bell/phone within reach;with family/visitor present     Time: 4196-2229 PT Time Calculation (min) (ACUTE ONLY): 21 min  Charges:  $Gait Training: 8-22 mins                    G Codes:      [name] 11/14/2014, 2:24 PM

## 2014-11-14 NOTE — Progress Notes (Signed)
Subjective: 3 Days Post-Op Procedure(s) (LRB): LEFT TOTAL KNEE ARTHROPLASTY (Left) Patient reports pain as mild and moderate.    Objective: Vital signs in last 24 hours: Temp:  [98.2 F (36.8 C)-98.8 F (37.1 C)] 98.8 F (37.1 C) (04/30 0514) Pulse Rate:  [84-95] 95 (04/30 0514) Resp:  [16-17] 16 (04/30 0514) BP: (115-122)/(60-74) 122/74 mmHg (04/30 0514) SpO2:  [96 %-100 %] 96 % (04/30 0514)  Intake/Output from previous day: 04/29 0701 - 04/30 0700 In: 240 [P.O.:240] Out: -  Intake/Output this shift:     Recent Labs  11/12/14 0600 11/13/14 0410 11/14/14 0354  HGB 10.5* 10.1* 10.2*    Recent Labs  11/13/14 0410 11/14/14 0354  WBC 16.4* 10.2  RBC 3.26* 3.29*  HCT 28.2* 29.0*  PLT 268 268    Recent Labs  11/13/14 0410 11/14/14 0354  NA 133* 136  K 4.6 4.3  CL 98 98  CO2 28 28  BUN 6 9  CREATININE 0.76 0.78  GLUCOSE 127* 105*  CALCIUM 9.2 9.4   No results for input(s): LABPT, INR in the last 72 hours.  Neurovascular intact Sensation intact distally Intact pulses distally Dorsiflexion/Plantar flexion intact Incision: dressing C/D/I and no drainage No cellulitis present Compartment soft  Assessment/Plan: 3 Days Post-Op Procedure(s) (LRB): LEFT TOTAL KNEE ARTHROPLASTY (Left) Up with therapy  Per SW note workers comp approval not available until Mon. Will plan on d/c to SNF Mon Pain control as ordered DVT proph Eliquis WBAT LLE Cont CPM as ordered   Tranika Scholler 11/14/2014, 11:11 AM

## 2014-11-15 NOTE — Progress Notes (Signed)
Physical Therapy Treatment Patient Details Name: MARLAYA TURCK MRN: [id number] DOB: [birth year] Today's Date: 11/15/2014    History of Present Illness Pt is a 59 y/o F s/p L TKA.  Pt's PMH includes back pain w/ radiation, fibromyalgia, unspecified myalgia and myositis, L carpal tunnel syndrome, chest pain, depressive disorder, and B RC surgery (per pt).    PT Comments    Pt progressing well. Continue per POC.  Follow Up Recommendations  SNF;Supervision/Assistance - 24 hour (due to pt living alone)     Equipment Recommendations  None recommended by PT    Recommendations for Other Services       Precautions / Restrictions Precautions Precautions: Knee;Fall Knee Immobilizer - Left: Discontinue once straight leg raise with < 10 degree lag (Pt able to perform SLR without knee lag.) Restrictions LLE Weight Bearing: Weight bearing as tolerated    Mobility  Bed Mobility               General bed mobility comments: Pt up in recliner upon PT arrival.  Transfers   Equipment used: Rolling walker (2 wheeled)   Sit to Stand: Supervision            Ambulation/Gait Ambulation/Gait assistance: Supervision Ambulation Distance (Feet): 300 Feet Assistive device: Rolling walker (2 wheeled) Gait Pattern/deviations: Step-through pattern;Decreased stride length;Antalgic   Gait velocity interpretation: Below normal speed for age/gender General Gait Details: Ambulated without KI. No difficulty or knee buckling noted.   Stairs            Wheelchair Mobility    Modified Rankin (Stroke Patients Only)       Balance                                    Cognition Arousal/Alertness: Awake/alert Behavior During Therapy: WFL for tasks assessed/performed Overall Cognitive Status: Within Functional Limits for tasks assessed                      Exercises Total Joint Exercises Ankle Circles/Pumps: AROM;Both;10 reps Quad Sets: AROM;Left;10  reps Heel Slides: AROM;Left;10 reps Hip ABduction/ADduction: AROM;Left;10 reps Straight Leg Raises: AROM;Left;10 reps    General Comments        Pertinent Vitals/Pain Pain Assessment: 0-10 Pain Score: 6  Pain Location: L knee Pain Intervention(s): Monitored during session;Patient requesting pain meds-RN notified    Home Living                      Prior Function            PT Goals (current goals can now be found in the care plan section) Progress towards PT goals: Progressing toward goals    Frequency  7X/week    PT Plan Current plan remains appropriate    Co-evaluation             End of Session Equipment Utilized During Treatment: Gait belt Activity Tolerance: Patient tolerated treatment well Patient left: in chair;with call bell/phone within reach     Time: 0831-0846 PT Time Calculation (min) (ACUTE ONLY): 15 min  Charges:  $Gait Training: 8-22 mins                    G Codes:      [name] 11/15/2014, 9:17 AM

## 2014-11-15 NOTE — Progress Notes (Signed)
Physical Therapy Treatment Patient Details Name: Shirley Bush MRN: [id number] DOB: [birth year] Today's Date: 11/15/2014    History of Present Illness Pt is a 59 y/o F s/p L TKA.  Pt's PMH includes back pain w/ radiation, fibromyalgia, unspecified myalgia and myositis, L carpal tunnel syndrome, chest pain, depressive disorder, and B RC surgery (per pt).    PT Comments    Pt is concerned about d/c home due to living alone and [facility].  Follow Up Recommendations  SNF;Supervision - Intermittent (due to living alone)     Equipment Recommendations  None recommended by PT    Recommendations for Other Services       Precautions / Restrictions Precautions Precautions: Knee;Fall Knee Immobilizer - Left: Discontinue once straight leg raise with < 10 degree lag (Pt able to perform SLR without knee lag.) Restrictions Weight Bearing Restrictions: Yes LLE Weight Bearing: Weight bearing as tolerated    Mobility  Bed Mobility               General bed mobility comments: Pt up in recliner upon PT arrival.  Transfers   Equipment used: Rolling walker (2 wheeled)   Sit to Stand: Supervision            Ambulation/Gait Ambulation/Gait assistance: Supervision Ambulation Distance (Feet): 300 Feet Assistive device: Rolling walker (2 wheeled) Gait Pattern/deviations: Step-through pattern;Decreased stride length;Antalgic   Gait velocity interpretation: Below normal speed for age/gender General Gait Details: Ambulated without KI. No difficulty or knee buckling noted.   Stairs   Stairs assistance: Min guard Stair Management: One rail Left;Step to pattern;Sideways Number of Stairs: 5 General stair comments: verbal cues for technique/sequencing  Wheelchair Mobility    Modified Rankin (Stroke Patients Only)       Balance                                    Cognition Arousal/Alertness: Awake/alert Behavior During Therapy: WFL for tasks  assessed/performed Overall Cognitive Status: Within Functional Limits for tasks assessed                      Exercises Total Joint Exercises Ankle Circles/Pumps: AROM;Both;10 reps Quad Sets: AROM;Left;10 reps Heel Slides: AROM;Left;10 reps Hip ABduction/ADduction: AROM;Left;10 reps Straight Leg Raises: AROM;Left;10 reps    General Comments        Pertinent Vitals/Pain Pain Assessment: 0-10 Pain Score: 6  Pain Location: L knee Pain Descriptors / Indicators: Pressure;Tightness Pain Intervention(s): Monitored during session;Patient requesting pain meds-RN notified;Ice applied    Home Living                      Prior Function            PT Goals (current goals can now be found in the care plan section) Progress towards PT goals: Progressing toward goals    Frequency  7X/week    PT Plan Current plan remains appropriate    Co-evaluation             End of Session Equipment Utilized During Treatment: Gait belt Activity Tolerance: Patient tolerated treatment well Patient left: in chair;with call bell/phone within reach     Time: 1254-1305 PT Time Calculation (min) (ACUTE ONLY): 11 min  Charges:  $Gait Training: 8-22 mins                    G Codes:  Shirley Bush 11/15/2014, 1:12 PM

## 2014-11-15 NOTE — Progress Notes (Signed)
Subjective: 4 Days Post-Op Procedure(s) (LRB): LEFT TOTAL KNEE ARTHROPLASTY (Left) Patient reports pain as mild and moderate.    Objective: Vital signs in last 24 hours: Temp:  [98.3 F (36.8 C)-98.5 F (36.9 C)] 98.3 F (36.8 C) (05/01 0730) Pulse Rate:  [88-114] 114 (05/01 0730) Resp:  [16] 16 (05/01 0730) BP: (104-116)/(69-80) 116/80 mmHg (05/01 0730) SpO2:  [98 %-100 %] 100 % (05/01 0730)  Intake/Output from previous day: 04/30 0701 - 05/01 0700 In: 1000 [P.O.:1000] Out: -  Intake/Output this shift: Total I/O In: 120 [P.O.:120] Out: -    Recent Labs  11/13/14 0410 11/14/14 0354  HGB 10.1* 10.2*    Recent Labs  11/13/14 0410 11/14/14 0354  WBC 16.4* 10.2  RBC 3.26* 3.29*  HCT 28.2* 29.0*  PLT 268 268    Recent Labs  11/13/14 0410 11/14/14 0354  NA 133* 136  K 4.6 4.3  CL 98 98  CO2 28 28  BUN 6 9  CREATININE 0.76 0.78  GLUCOSE 127* 105*  CALCIUM 9.2 9.4   No results for input(s): LABPT, INR in the last 72 hours.  Neurovascular intact Sensation intact distally Intact pulses distally Dorsiflexion/Plantar flexion intact Incision: dressing C/D/I No cellulitis present  Assessment/Plan: 4 Days Post-Op Procedure(s) (LRB): LEFT TOTAL KNEE ARTHROPLASTY (Left) Up with therapy  Per SW note workers comp approval not available until Mon. Will plan on d/c to SNF Mon Pain control as ordered DVT proph Eliquis WBAT LLE Cont CPM as ordered  Shirley Bush 11/15/2014, 9:11 AM

## 2014-11-16 NOTE — Progress Notes (Signed)
Subjective: 5 Days Post-Op Procedure(s) (LRB): LEFT TOTAL KNEE ARTHROPLASTY (Left) Patient reports pain as mild.  Patient doing much better today.  No nausea/vomiting, lightheadedness/dizziness, chest pain/sob.  Positive flatus and bm.  Tolerating diet.    Objective: Vital signs in last 24 hours: Temp:  [98.1 F (36.7 C)-98.3 F (36.8 C)] 98.1 F (36.7 C) (05/02 0600) Pulse Rate:  [92-120] 92 (05/02 0600) Resp:  [16] 16 (05/02 0600) BP: (98-128)/(68-78) 111/68 mmHg (05/02 0600) SpO2:  [97 %-100 %] 97 % (05/02 0600)  Intake/Output from previous day: 05/01 0701 - 05/02 0700 In: 1000 [P.O.:1000] Out: -  Intake/Output this shift: Total I/O In: 240 [P.O.:240] Out: -    Recent Labs  11/14/14 0354  HGB 10.2*    Recent Labs  11/14/14 0354  WBC 10.2  RBC 3.29*  HCT 29.0*  PLT 268    Recent Labs  11/14/14 0354  NA 136  K 4.3  CL 98  CO2 28  BUN 9  CREATININE 0.78  GLUCOSE 105*  CALCIUM 9.4   No results for input(s): LABPT, INR in the last 72 hours.  Neurologically intact Neurovascular intact Sensation intact distally Intact pulses distally Dorsiflexion/Plantar flexion intact Compartment soft  Negative homans bilaterally  Assessment/Plan: 5 Days Post-Op Procedure(s) (LRB): LEFT TOTAL KNEE ARTHROPLASTY (Left) Advance diet Up with therapy Discharge home with home health today as patient now feels comfortable to do so WBAT LLE Dry dressing change prn ABLA- mild and stable  Fannie Knee 11/16/2014, 10:04 AM

## 2014-11-16 NOTE — Progress Notes (Signed)
Physical Therapy Treatment Patient Details Name: Shirley Bush MRN: [id number] DOB: [birth year] Today's Date: 11/16/2014    History of Present Illness Pt is a 59 y/o F s/p L TKA.  Pt's PMH includes back pain w/ radiation, fibromyalgia, unspecified myalgia and myositis, L carpal tunnel syndrome, chest pain, depressive disorder, and B RC surgery (per pt).    PT Comments    Patient is making good progress with PT.  From a mobility standpoint anticipate patient will be ready for DC today and is awaiting final decision on where she will be going upon d/c.       Follow Up Recommendations  SNF;Supervision - Intermittent (May change: pt in communication w/ CM and SW)     Equipment Recommendations  None recommended by PT    Recommendations for Other Services       Precautions / Restrictions Precautions Precautions: Knee;Fall Precaution Comments: Reviewed no pillow under knee Restrictions Weight Bearing Restrictions: Yes LLE Weight Bearing: Weight bearing as tolerated    Mobility  Bed Mobility               General bed mobility comments: Pt up in recliner upon PT arrival.  Transfers Overall transfer level: Needs assistance Equipment used: Rolling walker (2 wheeled) Transfers: Sit to/from Stand Sit to Stand: Supervision         General transfer comment: supervision for safety  Ambulation/Gait Ambulation/Gait assistance: Supervision Ambulation Distance (Feet): 350 Feet Assistive device: Rolling walker (2 wheeled) Gait Pattern/deviations: Step-through pattern;Decreased stride length;Antalgic   Gait velocity interpretation: Below normal speed for age/gender General Gait Details: Ambulated w/o KI again this session.  Pt reports her L knee occassionally feels wheen during toe off but pt was able to improve this following verbal cues to tighten muscles during stance phase.  Otherwise, supervision for safety   Stairs            Wheelchair Mobility    Modified  Rankin (Stroke Patients Only)       Balance Overall balance assessment: Needs assistance Sitting-balance support: No upper extremity supported;Feet supported Sitting balance-Leahy Scale: Good     Standing balance support: No upper extremity supported Standing balance-Leahy Scale: Fair Standing balance comment: Pt able to stand w/o UE support at bathroom sink to wash hands                    Cognition Arousal/Alertness: Awake/alert Behavior During Therapy: WFL for tasks assessed/performed Overall Cognitive Status: Within Functional Limits for tasks assessed                      Exercises Total Joint Exercises Quad Sets: AROM;Left;5 reps;Seated Long Arc Quad: AROM;Left;10 reps;Seated Knee Flexion: AROM;Left;15 reps;Seated;Standing Goniometric ROM: 0-92    General Comments        Pertinent Vitals/Pain Pain Assessment: 0-10 Pain Score: 5  Pain Location: L knee Pain Descriptors / Indicators: Aching;Tightness Pain Intervention(s): Monitored during session;Limited activity within patient's tolerance;Repositioned;Premedicated before session    Home Living                      Prior Function            PT Goals (current goals can now be found in the care plan section) Acute Rehab PT Goals Patient Stated Goal: to leave hospital Progress towards PT goals: Progressing toward goals    Frequency  7X/week    PT Plan Current plan remains appropriate    Co-evaluation  End of Session Equipment Utilized During Treatment: Gait belt Activity Tolerance: Patient tolerated treatment well Patient left: in chair;with call bell/phone within reach     Time: 1046-1057 PT Time Calculation (min) (ACUTE ONLY): 11 min  Charges:  $Gait Training: 8-22 mins                    G Codes:      Joslyn Hy PT, [place] [phone] Pager: [phone] 11/16/2014, 11:33 AM

## 2014-11-16 NOTE — Progress Notes (Signed)
Physical Therapy Treatment Patient Details Name: Shirley Bush MRN: [id number] DOB: [birth year] Today's Date: 11/16/2014    History of Present Illness Pt is a 59 y/o F s/p L TKA.  Pt's PMH includes back pain w/ radiation, fibromyalgia, unspecified myalgia and myositis, L carpal tunnel syndrome, chest pain, depressive disorder, and B RC surgery (per pt).    PT Comments    Patient is making good progress with PT.  From a mobility standpoint anticipate patient will be ready for DC home today.     Follow Up Recommendations  Home health PT;Supervision - Intermittent (Pt is now comfortable w/ returning home)     Equipment Recommendations  None recommended by PT    Recommendations for Other Services       Precautions / Restrictions Precautions Precautions: Knee;Fall Precaution Comments: Reviewed no pillow under knee Restrictions Weight Bearing Restrictions: Yes LLE Weight Bearing: Weight bearing as tolerated    Mobility  Bed Mobility               General bed mobility comments: Pt up in recliner upon PT arrival.  Transfers Overall transfer level: Needs assistance Equipment used: Rolling walker (2 wheeled) Transfers: Sit to/from Stand Sit to Stand: Supervision         General transfer comment: supervision for safety  Ambulation/Gait Ambulation/Gait assistance: Supervision Ambulation Distance (Feet): 450 Feet Assistive device: Rolling walker (2 wheeled) Gait Pattern/deviations: Step-through pattern;Antalgic;Decreased stride length   Gait velocity interpretation: Below normal speed for age/gender General Gait Details: Supervision for safety, good carryover from previous session   Stairs            Wheelchair Mobility    Modified Rankin (Stroke Patients Only)       Balance Overall balance assessment: Needs assistance Sitting-balance support: No upper extremity supported;Feet supported Sitting balance-Leahy Scale: Good     Standing balance  support: Bilateral upper extremity supported;During functional activity Standing balance-Leahy Scale: Good Standing balance comment: Pt able to stand w/o UE support at bathroom sink to wash hands                    Cognition Arousal/Alertness: Awake/alert Behavior During Therapy: WFL for tasks assessed/performed Overall Cognitive Status: Within Functional Limits for tasks assessed                      Exercises Total Joint Exercises Quad Sets: AROM;Left;5 reps;Seated Hip ABduction/ADduction: AROM;Left;10 reps;Standing Long Arc Quad: AROM;Left;10 reps;Seated Knee Flexion: AROM;Standing;15 reps;Left Goniometric ROM: 0-92 Standing Hip Extension: AROM;Left;10 reps;Standing    General Comments General comments (skin integrity, edema, etc.): Discussed w/ pt the option of using a cane as she is moving extremely well and is stable w/ RW; however pt politely refused to attempt using cane as she currently feels more comfortable using RW.      Pertinent Vitals/Pain Pain Assessment: 0-10 Pain Score: 4  Pain Location: L knee Pain Descriptors / Indicators: Tightness Pain Intervention(s): Limited activity within patient's tolerance;Monitored during session;Repositioned    Home Living                      Prior Function            PT Goals (current goals can now be found in the care plan section) Acute Rehab PT Goals Patient Stated Goal: to leave hospital Progress towards PT goals: Progressing toward goals    Frequency  7X/week    PT Plan Discharge plan needs to be  updated    Co-evaluation             End of Session Equipment Utilized During Treatment: Gait belt Activity Tolerance: Patient tolerated treatment well Patient left: in chair;with call bell/phone within reach     Time: 1302-1314 PT Time Calculation (min) (ACUTE ONLY): 12 min  Charges:  $Gait Training: 8-22 mins                    G Codes:      Joslyn Hy PT, [place] [phone] Pager:  [phone] 11/16/2014, 2:06 PM

## 2014-11-16 NOTE — Clinical Social Work Note (Addendum)
2:22pm- CSW was informed by [facility] that [place] rep has approved patient to go home with [facility] agency.  CSW signing off.  Please re-consult if need should arise.  11:20am- CSW has left message with patient's worker's compensation representative Lucienne Capers [phone] regarding SNF authorization.  [employer] rep stated on Friday with PT recommending intermittent supervision if patient had to remain in the hospital until today (3 additional midnights), the patient would likely not need SNF at time of dc on Monday.  Patient is aware and agreeable to home with outpatient PT.  RNCM aware.  Both RNCM and CSW are awaiting a return call from [facility] rep/adjuster.    Nonnie Done, LCSW [phone]  Psychiatric & Orthopedics (5N 1-8) Clinical Social Worker

## 2014-11-22 DIAGNOSIS — Z01818 Encounter for other preprocedural examination: Secondary | ICD-10-CM | POA: Insufficient documentation

## 2014-11-22 NOTE — Progress Notes (Signed)
   Subjective:    Patient ID: Shirley Bush, female    DOB: October 21, 1955, 59 y.o.   MRN: [id number]  HPI Pt in for medical clearance for upcoming left knee replacement. States she is "ready for surgery" and has no complaints, specifically denies any recent chest pain or exertional fatigue, she also  Denies symptoms of infection   Review of Systems See HPI Denies recent fever or chills. Denies sinus pressure, nasal congestion, ear pain or sore throat. Denies chest congestion, productive cough or wheezing. Denies chest pains, palpitations and leg swelling Denies abdominal pain, nausea, vomiting,diarrhea or constipation.   Denies dysuria, frequency, hesitancy or incontinence.  Denies headaches, seizures, numbness, or tingling. Denies depression, anxiety or insomnia. Denies skin break down or rash.         Objective:   Physical Exam BP 124/70 mmHg  Pulse 88  Resp 18  Ht 5' 3.5" (1.613 m)  Wt 156 lb 1.9 oz (70.816 kg)  BMI 27.22 kg/m2  SpO2 97% Patient alert and oriented and in no cardiopulmonary distress.  HEENT: No facial asymmetry, EOMI,   oropharynx pink and moist.  Neck supple no JVD, no mass.  Chest: Clear to auscultation bilaterally.  CVS: S1, S2 no murmurs, no S3.Regular rate.  ABD: Soft non tender.   Ext: No edema  MS: Adequate ROM spine, shoulders, hips and reduced in left  knees.  Skin: Intact, no ulcerations or rash noted.  Psych: Good eye contact, normal affect. Memory intact not anxious or depressed appearing.  CNS: CN 2-12 intact, power,  normal throughout.no focal deficits noted.        Assessment & Plan:  Annual physical exam     Preoperative clearance Cleared medically and from a cardiac standpoint for left knee replacement. Physical exam, urinalysis and EKG are normal CXR shows clear lung fields   Allergic rhinitis Increased symptoms with season change, add singulair daily   Essential hypertension Controlled, no change in  medication

## 2014-11-22 NOTE — Assessment & Plan Note (Signed)
Controlled, no change in medication  

## 2014-11-22 NOTE — Assessment & Plan Note (Signed)
Increased symptoms with season change, add singulair daily

## 2014-11-22 NOTE — Assessment & Plan Note (Signed)
Cleared medically and from a cardiac standpoint for left knee replacement. Physical exam, urinalysis and EKG are normal CXR shows clear lung fields

## 2014-12-04 ENCOUNTER — Ambulatory Visit: Payer: Self-pay | Admitting: Family Medicine

## 2015-03-16 ENCOUNTER — Encounter: Payer: Self-pay | Admitting: *Deleted

## 2015-03-25 ENCOUNTER — Telehealth: Payer: Self-pay | Admitting: Family Medicine

## 2015-03-25 NOTE — Telephone Encounter (Signed)
Patient is calling requesting a 3 month supply of lisinopril-hydrochlorothiazide (PRINZIDE,ZESTORETIC) 20-25 MG per tablet be sent to [place], please advise?

## 2015-03-25 NOTE — Telephone Encounter (Signed)
Patient is calling requesting a 3 month supply of lisinopril-hydrochlorothiazide (PRINZIDE,ZESTORETIC) 20-25 MG per tablet be sent to [place], please advise?

## 2015-03-26 ENCOUNTER — Other Ambulatory Visit: Payer: Self-pay

## 2015-03-26 MED ORDER — LISINOPRIL-HYDROCHLOROTHIAZIDE 20-25 MG PO TABS: 1.0000 | ORAL_TABLET | Freq: Every day | ORAL | Status: DC

## 2015-03-26 NOTE — Telephone Encounter (Signed)
Med changed to a 90 day supply per patient request.

## 2015-04-22 ENCOUNTER — Ambulatory Visit: Payer: Self-pay | Admitting: Family Medicine

## 2015-05-20 ENCOUNTER — Ambulatory Visit: Payer: Self-pay | Admitting: Family Medicine

## 2015-07-15 ENCOUNTER — Telehealth: Payer: Self-pay | Admitting: Family Medicine

## 2015-07-15 ENCOUNTER — Other Ambulatory Visit: Payer: Self-pay

## 2015-07-15 MED ORDER — LISINOPRIL-HYDROCHLOROTHIAZIDE 20-25 MG PO TABS: 1.0000 | ORAL_TABLET | Freq: Every day | ORAL | Status: DC

## 2015-07-15 NOTE — Telephone Encounter (Signed)
Medication refilled.  Also sent letter reminding patient that she is past due for follow up and that she can come in for flu shot

## 2015-07-15 NOTE — Telephone Encounter (Signed)
Patient is calling asking for a refill on lisinopril-hydrochlorothiazide (PRINZIDE,ZESTORETIC) 20-25 MG per tablet  And she needs a 90 day supply

## 2015-07-22 ENCOUNTER — Other Ambulatory Visit: Payer: Self-pay

## 2015-07-22 ENCOUNTER — Telehealth: Payer: Self-pay | Admitting: Family Medicine

## 2015-07-22 MED ORDER — LISINOPRIL-HYDROCHLOROTHIAZIDE 20-25 MG PO TABS: 1.0000 | ORAL_TABLET | Freq: Every day | ORAL | Status: DC

## 2015-07-22 NOTE — Telephone Encounter (Signed)
Patient is stating that Suzie Portela has not gotten her blood pressure medication refilled, she states they never received our ok, but I told the patient that Loma Sousa had sent in refills on 07/15/15

## 2015-07-22 NOTE — Telephone Encounter (Signed)
Med refilled again

## 2015-10-05 ENCOUNTER — Encounter: Payer: Self-pay | Admitting: Family Medicine

## 2015-10-05 ENCOUNTER — Ambulatory Visit (INDEPENDENT_AMBULATORY_CARE_PROVIDER_SITE_OTHER): Payer: BLUE CROSS/BLUE SHIELD | Admitting: Family Medicine

## 2015-10-05 VITALS — BP 156/84 | HR 88 | Resp 18 | Ht 63.5 in | Wt 150.0 lb

## 2015-10-05 DIAGNOSIS — Z23 Encounter for immunization: Secondary | ICD-10-CM | POA: Diagnosis not present

## 2015-10-05 DIAGNOSIS — J3089 Other allergic rhinitis: Secondary | ICD-10-CM

## 2015-10-05 DIAGNOSIS — F329 Major depressive disorder, single episode, unspecified: Secondary | ICD-10-CM

## 2015-10-05 DIAGNOSIS — I10 Essential (primary) hypertension: Secondary | ICD-10-CM

## 2015-10-05 DIAGNOSIS — F32A Depression, unspecified: Secondary | ICD-10-CM

## 2015-10-05 DIAGNOSIS — E785 Hyperlipidemia, unspecified: Secondary | ICD-10-CM

## 2015-10-05 MED ORDER — FLUOXETINE HCL 20 MG PO TABS: 20.0000 mg | ORAL_TABLET | Freq: Every day | ORAL | Status: DC

## 2015-10-05 MED ORDER — TEMAZEPAM 15 MG PO CAPS: 15.0000 mg | ORAL_CAPSULE | Freq: Every evening | ORAL | Status: DC | PRN

## 2015-10-05 NOTE — Patient Instructions (Addendum)
Physical Exam in 8 to 10 weeks, call if you need me before  New for depression and difficulty with sleep are fluoxetine and restoril, you are also referred to therapist which ill be very beneficial  Try to use tylenol 500mg  tablet one daily in place of the meloxicam if able  Flu vaccine today  Use daily nasacort for allergies  Fasting labs are due  Please schedule you mammogram , # will be provided

## 2015-10-05 NOTE — Progress Notes (Signed)
Subjective:    Patient ID: Shirley Bush, female    DOB: March 22, 1956, 60 y.o.   MRN: [id number]  HPI   Shirley Bush     MRN: [id number]      DOB: 04/28/1956   HPI [name] is here for follow up and re-evaluation of chronic medical conditions, medication management and review of any available recent lab and radiology data.  Preventive health is updated, specifically  Cancer screening and Immunization.   Questions or concerns regarding consultations or procedures which the PT has had in the interim are  addressed. The PT denies any adverse reactions to current medications since the last visit.    ROS Denies recent fever or chills. C/o increased  sinus pressure and , nasal congestion,denies  ear pain or sore throat. Denies chest congestion, productive cough c/o wheezing. Denies chest pains, palpitations and leg swelling Denies abdominal pain, nausea, vomiting,diarrhea or constipation.   Denies dysuria, frequency, hesitancy or incontinence. Denies joint pain, swelling and limitation in mobility. Denies headaches, seizures, numbness, or tingling. C/o increased  depression, anxiety and  Insomnia due to loss of job relate to her recent ortho surgeries, not suicidal or homicidal Denies skin break down or rash.   PE  BP 156/84 mmHg  Pulse 88  Resp 18  Ht 5' 3.5" (1.613 m)  Wt 150 lb (68.04 kg)  BMI 26.15 kg/m2  SpO2 100%  Patient alert and oriented and in no cardiopulmonary distress.  HEENT: No facial asymmetry, EOMI,   oropharynx pink and moist.  Neck supple no JVD, no mass. conjunctival erythema and excessive watery eyes, erythema and edema of nasal mucosa  Chest: Clear to auscultation bilaterally.  CVS: S1, S2 no murmurs, no S3.Regular rate.  ABD: Soft non tender.   Ext: No edema  MS: Adequate ROM spine, shoulders, hips and knees.  Skin: Intact, no ulcerations or rash noted.  Psych: Good eye contact, normal affect. Memory intact not anxious tearful and   depressed appearing at times  CNS: CN 2-12 intact, power,  normal throughout.no focal deficits noted.   Assessment & Plan   Depression Start fluoxetine  and restoril , re eval in 6 to 8 weeks, also referred to  therapy and agrees to go at this time  Essential hypertension Uncontrolled, dose increase in medication DASH diet and commitment to daily physical activity for a minimum of 30 minutes discussed and encouraged, as a part of hypertension management. The importance of attaining a healthy weight is also discussed.  BP/Weight 10/05/2015 11/16/2014 11/11/2014 10/30/2014 10/20/2014 06/02/2014 [id number]  Systolic BP A999333 0000000 - [id number] A999333 [id number] A999333  Diastolic BP 84 81 - 78 70 80 85  Wt. (Lbs) 150 - 157.6 157.6 156.12 152.12 145  BMI 26.15 - 27.92 27.92 27.22 26.95 25.69       f/u in 6 to 8 weeks  Need for prophylactic vaccination and inoculation against influenza After obtaining informed consent, the vaccine is  administered by LPN.   Hyperlipemia Improved, continue dietary and lifestyle management Hyperlipidemia:Low fat diet discussed and encouraged.   Lipid Panel  Lab Results  Component Value Date   CHOL 180 10/07/2015   HDL 57 10/07/2015   LDLCALC 103 10/07/2015   TRIG 101 10/07/2015   CHOLHDL 3.2 10/07/2015        Allergic rhinitis Uncontrolled symptoms with recent change in season, commit to daily nasal steroid, med sent       Review of Systems     Objective:  Physical Exam        Assessment & Plan:

## 2015-10-07 ENCOUNTER — Other Ambulatory Visit: Payer: Self-pay | Admitting: Family Medicine

## 2015-10-07 ENCOUNTER — Telehealth: Payer: Self-pay

## 2015-10-07 DIAGNOSIS — R7301 Impaired fasting glucose: Secondary | ICD-10-CM

## 2015-10-07 DIAGNOSIS — E785 Hyperlipidemia, unspecified: Secondary | ICD-10-CM

## 2015-10-07 DIAGNOSIS — I10 Essential (primary) hypertension: Secondary | ICD-10-CM

## 2015-10-07 LAB — LIPID PANEL
CHOL/HDL RATIO: 3.2 ratio (ref <=5.0)
CHOLESTEROL: 180 mg/dL (ref 125–200)
HDL: 57 mg/dL (ref >=46)
LDL Cholesterol: 103 mg/dL (ref <130)
TRIGLYCERIDES: 101 mg/dL (ref <150)
VLDL: 20 mg/dL (ref <30)

## 2015-10-07 LAB — COMPLETE METABOLIC PANEL WITH GFR
ALBUMIN: 4.2 g/dL (ref 3.6–5.1)
ALK PHOS: 53 U/L (ref 33–130)
ALT: 17 U/L (ref 6–29)
AST: 21 U/L (ref 10–35)
BUN: 9 mg/dL (ref 7–25)
CALCIUM: 9.5 mg/dL (ref 8.6–10.4)
CHLORIDE: 102 mmol/L (ref 98–110)
CO2: 28 mmol/L (ref 20–31)
Creat: 0.91 mg/dL (ref 0.50–1.05)
GFR, EST AFRICAN AMERICAN: 81 mL/min (ref >=60)
GFR, Est Non African American: 70 mL/min (ref >=60)
Glucose, Bld: 88 mg/dL (ref 65–99)
POTASSIUM: 4.6 mmol/L (ref 3.5–5.3)
Sodium: 139 mmol/L (ref 135–146)
Total Bilirubin: 0.3 mg/dL (ref 0.2–1.2)
Total Protein: 6.9 g/dL (ref 6.1–8.1)

## 2015-10-07 LAB — HEMOGLOBIN A1C
Hgb A1c MFr Bld: 5.7 % — ABNORMAL HIGH (ref <5.7)
Mean Plasma Glucose: 117 mg/dL — ABNORMAL HIGH (ref <117)

## 2015-10-07 NOTE — Telephone Encounter (Signed)
Labs ordered.

## 2015-10-10 DIAGNOSIS — Z23 Encounter for immunization: Secondary | ICD-10-CM | POA: Insufficient documentation

## 2015-10-10 NOTE — Assessment & Plan Note (Signed)
After obtaining informed consent, the vaccine is  administered by LPN.  

## 2015-10-10 NOTE — Assessment & Plan Note (Addendum)
Start fluoxetine  and restoril , re eval in 6 to 8 weeks, also referred to  therapy and agrees to go at this time

## 2015-10-10 NOTE — Assessment & Plan Note (Signed)
Uncontrolled, dose increase in medication DASH diet and commitment to daily physical activity for a minimum of 30 minutes discussed and encouraged, as a part of hypertension management. The importance of attaining a healthy weight is also discussed.  BP/Weight 10/05/2015 11/16/2014 11/11/2014 10/30/2014 10/20/2014 06/02/2014 [id number]  Systolic BP A999333 0000000 - [id number] A999333 [id number] A999333  Diastolic BP 84 81 - 78 70 80 85  Wt. (Lbs) 150 - 157.6 157.6 156.12 152.12 145  BMI 26.15 - 27.92 27.92 27.22 26.95 25.69       f/u in 6 to 8 weeks

## 2015-10-10 NOTE — Assessment & Plan Note (Signed)
Uncontrolled symptoms with recent change in season, commit to daily nasal steroid, med sent

## 2015-10-10 NOTE — Assessment & Plan Note (Signed)
Improved, continue dietary and lifestyle management Hyperlipidemia:Low fat diet discussed and encouraged.   Lipid Panel  Lab Results  Component Value Date   CHOL 180 10/07/2015   HDL 57 10/07/2015   LDLCALC 103 10/07/2015   TRIG 101 10/07/2015   CHOLHDL 3.2 10/07/2015

## 2015-10-12 ENCOUNTER — Other Ambulatory Visit: Payer: Self-pay | Admitting: Family Medicine

## 2015-10-12 DIAGNOSIS — Z1231 Encounter for screening mammogram for malignant neoplasm of breast: Secondary | ICD-10-CM

## 2015-10-12 LAB — HEPATITIS C ANTIBODY: HCV Ab: NEGATIVE

## 2015-10-13 ENCOUNTER — Ambulatory Visit (HOSPITAL_COMMUNITY): Payer: Self-pay

## 2015-10-14 ENCOUNTER — Ambulatory Visit (HOSPITAL_COMMUNITY)
Admission: RE | Admit: 2015-10-14 | Discharge: 2015-10-14 | Disposition: A | Discharge status: Home / Self Care | Payer: BLUE CROSS/BLUE SHIELD | Source: Ambulatory Visit | Attending: Family Medicine | Admitting: Family Medicine

## 2015-10-14 DIAGNOSIS — Z1231 Encounter for screening mammogram for malignant neoplasm of breast: Secondary | ICD-10-CM | POA: Insufficient documentation

## 2015-10-20 ENCOUNTER — Telehealth: Payer: Self-pay | Admitting: Family Medicine

## 2015-10-20 NOTE — Telephone Encounter (Signed)
Patient doesn't feel any better since starting the fluoxetine. Has been on it for 2 weeks. Wants to know what she needs to do next or if you want to change her med? Please advise

## 2015-10-20 NOTE — Telephone Encounter (Signed)
Shirley Bush called and stated that her medications were no longer helping, she is asking what can she do, please advise?

## 2015-10-20 NOTE — Telephone Encounter (Signed)
I recommend psychiatry to help with her depression management since so severe, pls refer if she agrees

## 2015-10-21 MED ORDER — BUPROPION HCL ER (XL) 150 MG PO TB24: 150.0000 mg | ORAL_TABLET | Freq: Every day | ORAL | Status: DC

## 2015-10-21 NOTE — Telephone Encounter (Signed)
She already has an appt with [facility] April 26th. fyi

## 2015-10-21 NOTE — Telephone Encounter (Signed)
Patient aware and med sent  

## 2015-10-21 NOTE — Telephone Encounter (Signed)
I have added wellbutrin 150 mg daily, this is printed, pls fax after you speak with her, she is to continue the fluoxetine

## 2015-10-21 NOTE — Addendum Note (Signed)
Addended by: Fayrene Helper on: 10/21/2015 08:42 AM   Modules accepted: Orders

## 2015-11-08 ENCOUNTER — Telehealth (HOSPITAL_COMMUNITY): Payer: Self-pay | Admitting: *Deleted

## 2015-11-10 ENCOUNTER — Ambulatory Visit (HOSPITAL_COMMUNITY): Payer: Self-pay | Admitting: Psychiatry

## 2015-11-19 ENCOUNTER — Telehealth: Payer: Self-pay | Admitting: Family Medicine

## 2015-11-19 NOTE — Telephone Encounter (Signed)
Patient is stating that the medication she was put on is really not helping, she had to cancel her appointment with Dr. Harrington Challenger she states her insurance would not pay for, She is specifically asking for medication Ketamine please advise

## 2015-11-22 NOTE — Telephone Encounter (Signed)
Called patient and left message for them to return call at the office   

## 2015-11-22 NOTE — Telephone Encounter (Signed)
[employer] would not cover Shirley Bush. Will call her insurance and see who is in network and call us back to be referred to them

## 2015-12-03 ENCOUNTER — Encounter: Payer: Self-pay | Admitting: Family Medicine

## 2015-12-03 ENCOUNTER — Other Ambulatory Visit (HOSPITAL_COMMUNITY)
Admission: RE | Admit: 2015-12-03 | Discharge: 2015-12-03 | Disposition: A | Discharge status: Home / Self Care | Payer: BLUE CROSS/BLUE SHIELD | Source: Ambulatory Visit | Attending: Family Medicine | Admitting: Family Medicine

## 2015-12-03 ENCOUNTER — Ambulatory Visit (INDEPENDENT_AMBULATORY_CARE_PROVIDER_SITE_OTHER): Payer: BLUE CROSS/BLUE SHIELD | Admitting: Family Medicine

## 2015-12-03 VITALS — BP 140/82 | HR 80 | Resp 16 | Ht 64.0 in | Wt 151.1 lb

## 2015-12-03 DIAGNOSIS — Z0000 Encounter for general adult medical examination without abnormal findings: Secondary | ICD-10-CM

## 2015-12-03 DIAGNOSIS — Z1159 Encounter for screening for other viral diseases: Secondary | ICD-10-CM | POA: Diagnosis not present

## 2015-12-03 DIAGNOSIS — Z1211 Encounter for screening for malignant neoplasm of colon: Secondary | ICD-10-CM | POA: Diagnosis not present

## 2015-12-03 DIAGNOSIS — Z0001 Encounter for general adult medical examination with abnormal findings: Secondary | ICD-10-CM | POA: Insufficient documentation

## 2015-12-03 DIAGNOSIS — F329 Major depressive disorder, single episode, unspecified: Secondary | ICD-10-CM

## 2015-12-03 DIAGNOSIS — I10 Essential (primary) hypertension: Secondary | ICD-10-CM | POA: Diagnosis not present

## 2015-12-03 DIAGNOSIS — F32A Depression, unspecified: Secondary | ICD-10-CM

## 2015-12-03 DIAGNOSIS — Z01419 Encounter for gynecological examination (general) (routine) without abnormal findings: Secondary | ICD-10-CM | POA: Diagnosis present

## 2015-12-03 DIAGNOSIS — R7301 Impaired fasting glucose: Secondary | ICD-10-CM

## 2015-12-03 DIAGNOSIS — J3089 Other allergic rhinitis: Secondary | ICD-10-CM

## 2015-12-03 DIAGNOSIS — Z124 Encounter for screening for malignant neoplasm of cervix: Secondary | ICD-10-CM

## 2015-12-03 HISTORY — DX: Encounter for general adult medical examination without abnormal findings: Z00.00

## 2015-12-03 LAB — POC HEMOCCULT BLD/STL (OFFICE/1-CARD/DIAGNOSTIC): FECAL OCCULT BLD: NEGATIVE

## 2015-12-03 NOTE — Assessment & Plan Note (Signed)
Unchanged from March visit, pt has not been taking the medication. Request psychiatry eval and management Not actively suicidal or homicidal. Referred to Provider of her choice

## 2015-12-03 NOTE — Assessment & Plan Note (Signed)
increassed and uncontrolled symptoms, nasacort and saline flushes as neded

## 2015-12-03 NOTE — Assessment & Plan Note (Signed)

## 2015-12-03 NOTE — Patient Instructions (Addendum)
F/u in 5.5 months , call if you need me before  You are referred to psychiatry  CVontinue to reduce carbs so sugar normalizes  Saline nasal flushes  And your nasonex are all that you need now, no infection  No fasting HBa1C, chem 7 , HIV, CBC in 5.5 months  Please start coated aspirin 81 mg daily for stroke risk reduction  KEEP your best friend!  Please work on good  health habits so that your health will improve. 1. Commitment to daily physical activity for 30 to 60  minutes, if you are able to do this.  2. Commitment to wise food choices. Aim for half of your  food intake to be vegetable and fruit, one quarter starchy foods, and one quarter protein. Try to eat on a regular schedule  3 meals per day, snacking between meals should be limited to vegetables or fruits or small portions of nuts. 64 ounces of water per day is generally recommended, unless you have specific health conditions, like heart failure or kidney failure where you will need to limit fluid intake.  3. Commitment to sufficient and a  good quality of physical and mental rest daily, generally between 6 to 8 hours per day.  WITH PERSISTANCE AND PERSEVERANCE, THE IMPOSSIBLE , BECOMES THE NORM!  Thank you  for choosing [facility]. We consider it a privelige to serve you.  Delivering excellent health care in a caring and  compassionate way is our goal.  Partnering with you,  so that together we can achieve this goal is our strategy.

## 2015-12-03 NOTE — Progress Notes (Signed)
   Subjective:    Patient ID: Shirley Bush, female    DOB: [birth year], 58 y.o.   MRN: [id number]  HPI Patient is in for annual physical exam. C/o excessive cough and uncontrolled depression, No response to medication noted so she stopped them on her own. Wants to see psychiatrist, not actively suicidal or homicidal Recent labs, if available are reviewed. Immunization is reviewed , and  updated if needed.   Review of Systems See HPI     Objective:   Physical Exam  BP 140/82 mmHg  Pulse 80  Resp 16  Ht 5\' 4"  (1.626 m)  Wt 151 lb 1.9 oz (68.548 kg)  BMI 25.93 kg/m2  SpO2 99% Pleasant well nourished female, alert and oriented x 3, in no cardio-pulmonary distress. Afebrile. HEENT No facial trauma or asymetry. Sinuses non tender.  Extra occullar muscles intact. Erythema and edema of nasal mucosa. External ears normal, tympanic membranes clear. Oropharynx moist, no exudate, good dentition. Neck: supple, no adenopathy,JVD or thyromegaly.No bruits.  Chest: Clear to ascultation bilaterally.No crackles or wheezes. Non tender to palpation  Breast: No asymetry,no masses or lumps. No tenderness. No nipple discharge or inversion. No axillary or supraclavicular adenopathy  Cardiovascular system; Heart sounds normal,  S1 and  S2 ,no S3.  No murmur, or thrill. Apical beat not displaced Peripheral pulses normal.  Abdomen: Soft, non tender, no organomegaly or masses. No bruits. Bowel sounds normal. No guarding, tenderness or rebound.  Rectal:  Normal sphincter tone. No mass.No rectal masses.  Guaiac negative stool.  GU: External genitalia normal female genitalia , female distribution of hair. No lesions. Urethral meatus normal in size, no  Prolapse, no lesions visibly  Present. Bladder non tender. Vagina pink and moist , with no visible lesions , discharge present . Adequate pelvic support no  cystocele or rectocele noted Cervix pink and appears healthy, no lesions or  ulcerations noted, no discharge noted from os Uterus normal size, no adnexal masses, no cervical motion or adnexal tenderness.   Musculoskeletal exam: Full ROM of spine, hips , shoulders and knees. No deformity ,swelling or crepitus noted. No muscle wasting or atrophy.   Neurologic: Cranial nerves 2 to 12 intact. Power, tone ,sensation and reflexes normal throughout. No disturbance in gait. No tremor.  Skin: Intact, no ulceration, erythema , scaling or rash noted. Pigmentation normal throughout  Psych; Depressed mood and normal affect. Judgement and concentration normal       Assessment & Plan:  Annual physical exam Annual exam as documented. Counseling done  re healthy lifestyle involving commitment to 150 minutes exercise per week, heart healthy diet, and attaining healthy weight.The importance of adequate sleep also discussed. Regular seat belt use and home safety, is also discussed. Changes in health habits are decided on by the patient with goals and time frames  set for achieving them. Immunization and cancer screening needs are specifically addressed at this visit.   Allergic rhinitis increassed and uncontrolled symptoms, nasacort and saline flushes as neded  Depression Unchanged from March visit, pt has not been taking the medication. Request psychiatry eval and management Not actively suicidal or homicidal. Referred to Provider of her choice

## 2015-12-08 LAB — CYTOLOGY - PAP

## 2016-01-27 ENCOUNTER — Other Ambulatory Visit: Payer: Self-pay | Admitting: Family Medicine

## 2016-04-25 ENCOUNTER — Other Ambulatory Visit: Payer: Self-pay | Admitting: Family Medicine

## 2016-04-25 LAB — CBC
HEMATOCRIT: 39.3 % (ref 35.0–45.0)
HEMOGLOBIN: 13.5 g/dL (ref 11.7–15.5)
MCH: 30.8 pg (ref 27.0–33.0)
MCHC: 34.4 g/dL (ref 32.0–36.0)
MCV: 89.5 fL (ref 80.0–100.0)
MPV: 9.8 fL (ref 7.5–12.5)
Platelets: 372 10*3/uL (ref 140–400)
RBC: 4.39 MIL/uL (ref 3.80–5.10)
RDW: 13.4 % (ref 11.0–15.0)
WBC: 6.9 10*3/uL (ref 3.8–10.8)

## 2016-04-26 LAB — BASIC METABOLIC PANEL
BUN: 11 mg/dL (ref 7–25)
CALCIUM: 10 mg/dL (ref 8.6–10.4)
CO2: 25 mmol/L (ref 20–31)
Chloride: 99 mmol/L (ref 98–110)
Creat: 1.06 mg/dL — ABNORMAL HIGH (ref 0.50–1.05)
Glucose, Bld: 92 mg/dL (ref 65–99)
POTASSIUM: 5 mmol/L (ref 3.5–5.3)
SODIUM: 137 mmol/L (ref 135–146)

## 2016-04-26 LAB — HIV ANTIBODY (ROUTINE TESTING W REFLEX): HIV: NONREACTIVE

## 2016-04-26 LAB — HEMOGLOBIN A1C
Hgb A1c MFr Bld: 5.6 % (ref <5.7)
MEAN PLASMA GLUCOSE: 114 mg/dL

## 2016-04-27 ENCOUNTER — Ambulatory Visit (INDEPENDENT_AMBULATORY_CARE_PROVIDER_SITE_OTHER): Payer: BLUE CROSS/BLUE SHIELD | Admitting: Family Medicine

## 2016-04-27 ENCOUNTER — Encounter: Payer: Self-pay | Admitting: Family Medicine

## 2016-04-27 VITALS — BP 124/82 | HR 102 | Ht 64.0 in | Wt 155.1 lb

## 2016-04-27 DIAGNOSIS — M797 Fibromyalgia: Secondary | ICD-10-CM

## 2016-04-27 DIAGNOSIS — I10 Essential (primary) hypertension: Secondary | ICD-10-CM | POA: Diagnosis not present

## 2016-04-27 DIAGNOSIS — Z23 Encounter for immunization: Secondary | ICD-10-CM

## 2016-04-27 DIAGNOSIS — F3289 Other specified depressive episodes: Secondary | ICD-10-CM | POA: Diagnosis not present

## 2016-04-27 DIAGNOSIS — M549 Dorsalgia, unspecified: Secondary | ICD-10-CM

## 2016-04-27 DIAGNOSIS — J3089 Other allergic rhinitis: Secondary | ICD-10-CM

## 2016-04-27 LAB — TSH: TSH: 1.61 m[IU]/L

## 2016-04-27 MED ORDER — CYCLOBENZAPRINE HCL 5 MG PO TABS: 5.0000 mg | ORAL_TABLET | Freq: Every day | ORAL | 5 refills | Status: DC

## 2016-04-27 MED ORDER — LISINOPRIL-HYDROCHLOROTHIAZIDE 20-25 MG PO TABS: 1.0000 | ORAL_TABLET | Freq: Every day | ORAL | 1 refills | Status: DC

## 2016-04-27 NOTE — Progress Notes (Signed)
   AZRIELA FIELDEN     MRN: [id number]      DOB: 1956/06/29   HPI Ms. Wenberg is here for follow up and re-evaluation of chronic medical conditions, medication management and review of any available recent lab and radiology data.  Preventive health is updated, specifically  Cancer screening and Immunization.   Questions or concerns regarding consultations or procedures which the PT has had in the interim are  addressed. Seeing psychiatry states not really better but denies being suicidal or homicidal, willing to try with medication. States trying to get disability, because of generalized joint pains, fibromyalgia uncontrolled with poor sleep, offered muscle relaxant, bur she "is scared",  But at end of visit , agrees to try this   ROS Denies recent fever or chills. Denies sinus pressure, nasal congestion, ear pain or sore throat. Denies chest congestion, productive cough or wheezing. Denies chest pains, palpitations and leg swelling Denies abdominal pain, nausea, vomiting,diarrhea or constipation.   Denies dysuria, frequency, hesitancy or incontinence. . Denies uncontrolled  Headaches, or  seizures Denies skin break down or rash.   PE  BP 124/82   Pulse (!) 102   Ht 5\' 4"  (1.626 m)   Wt 155 lb 1.9 oz (70.4 kg)   SpO2 98%   BMI 26.63 kg/m   Patient alert and oriented and in no cardiopulmonary distress.  HEENT: No facial asymmetry, EOMI,   oropharynx pink and moist.  Neck supple no JVD, no mass.  Chest: Clear to auscultation bilaterally.  CVS: S1, S2 no murmurs, no S3.Regular rate.  ABD: Soft non tender.   Ext: No edema  MS: Adequate ROM spine, shoulders, hips and knees. Multiple tender poiunts Skin: Intact, no ulcerations or rash noted.  Psych: Good eye contact, normal affect. Memory intact mildly  anxious , at times tearful and  depressed appearing.  CNS: CN 2-12 intact, power,  normal throughout.no focal deficits noted.   Assessment & Plan  Essential  hypertension Controlled, no change in medication DASH diet and commitment to daily physical activity for a minimum of 30 minutes discussed and encouraged, as a part of hypertension management. The importance of attaining a healthy weight is also discussed.  BP/Weight 04/27/2016 12/03/2015 10/05/2015 11/16/2014 11/11/2014 0000000 [id number]  Systolic BP A999333 [id number] A999333 0000000 - [id number] A999333  Diastolic BP 82 82 84 81 - 78 70  Wt. (Lbs) 155.12 151.12 150 - 157.6 157.6 156.12  BMI 26.63 25.93 26.15 - 27.92 27.92 27.22       Depression Being treated by psychiatry, reports no response to treatment yet, but wants this to improve, will continue with therapy and psych, recently started on increased dose of cymbalta  Fibromyalgia Reports chronic uncontrolled generalized pain and stiffness. Wants to hold on rheumatology eval. Encouraged daily exercise and stretching, new is flexeril at bedtime if tolerated  Allergic rhinitis Controlled, no change in medication   Back pain with radiation No current or recent flare

## 2016-04-27 NOTE — Assessment & Plan Note (Signed)
Reports chronic uncontrolled generalized pain and stiffness. Wants to hold on rheumatology eval. Encouraged daily exercise and stretching, new is flexeril at bedtime if tolerated

## 2016-04-27 NOTE — Assessment & Plan Note (Signed)
Controlled, no change in medication  

## 2016-04-27 NOTE — Assessment & Plan Note (Signed)
Controlled, no change in medication DASH diet and commitment to daily physical activity for a minimum of 30 minutes discussed and encouraged, as a part of hypertension management. The importance of attaining a healthy weight is also discussed.  BP/Weight 04/27/2016 12/03/2015 10/05/2015 11/16/2014 11/11/2014 0000000 [id number]  Systolic BP A999333 [id number] A999333 0000000 - [id number] A999333  Diastolic BP 82 82 84 81 - 78 70  Wt. (Lbs) 155.12 151.12 150 - 157.6 157.6 156.12  BMI 26.63 25.93 26.15 - 27.92 27.92 27.22

## 2016-04-27 NOTE — Assessment & Plan Note (Signed)
Being treated by psychiatry, reports no response to treatment yet, but wants this to improve, will continue with therapy and psych, recently started on increased dose of cymbalta

## 2016-04-27 NOTE — Patient Instructions (Addendum)
F/u in 5 month, call if you need me before  Flu vaccine today  Excellent labs and blood pressure  Call for referral to rheumatologist for fibromyalgia management , when you decide on this  New for fibromyalgia and help with sleep is muscle relaxant , flexeril, one at bedtime, this should help alot  Keep all appointments with mental health please  Thank you  for choosing  Primary [facility]. We consider it a privelige to serve you.  Delivering excellent health care in a caring and  compassionate way is our goal.  Partnering with you,  so that together we can achieve this goal is our strategy.

## 2016-04-27 NOTE — Assessment & Plan Note (Signed)
No current or recent flare

## 2016-09-28 ENCOUNTER — Ambulatory Visit: Payer: Self-pay | Admitting: Family Medicine

## 2016-11-16 ENCOUNTER — Ambulatory Visit (INDEPENDENT_AMBULATORY_CARE_PROVIDER_SITE_OTHER): Payer: Self-pay | Admitting: Family Medicine

## 2016-11-16 ENCOUNTER — Encounter: Payer: Self-pay | Admitting: Family Medicine

## 2016-11-16 VITALS — BP 150/86 | HR 72 | Resp 16 | Ht 64.0 in | Wt 160.0 lb

## 2016-11-16 DIAGNOSIS — I10 Essential (primary) hypertension: Secondary | ICD-10-CM

## 2016-11-16 DIAGNOSIS — J309 Allergic rhinitis, unspecified: Secondary | ICD-10-CM

## 2016-11-16 MED ORDER — LISINOPRIL-HYDROCHLOROTHIAZIDE 20-25 MG PO TABS: 1.0000 | ORAL_TABLET | Freq: Every day | ORAL | 3 refills | Status: DC

## 2016-11-16 NOTE — Assessment & Plan Note (Signed)
Pt to change to nasonex which she buys OTC

## 2016-11-16 NOTE — Progress Notes (Signed)
   Shirley Bush     MRN: [id number]      DOB: 1956-04-09   HPI Shirley Bush is here for follow up and the need to refill her anti hypertensive medication. She is unemployed and uninsured. She intends to start a job in the near future , though worried that she may be unable to actually do it because of chronic joint issues for which she has had multiple different surgeries. States has been unable to get disability, and needs to work to support herself as her cash is running out Had started using flonase for allergies but causes her to itch so is resuming nasonex ROS See HPI Denies recent fever chills, sore throat or productive  Cough Denies abdominal pain , nausea, vomiting , constipation or loose stool Increased stress related to family issues with her son and grandchildren currently living with her, but realizes she needs to get out to work  PE  BP (!) 150/86   Pulse 72   Resp 16   Ht 5\' 4"  (1.626 m)   Wt 160 lb (72.6 kg)   SpO2 97%   BMI 27.46 kg/m   Patient alert and oriented and in no cardiopulmonary distress.  HEENT: No facial asymmetry, EOMI,   oropharynx pink and moist.  Neck supple no JVD, no mass.  Chest: Clear to auscultation bilaterally.  CVS: S1, S2 no murmurs, no S3.Regular rate.  ABD: Soft non tender.   Ext: No edema  MS: Adequate ROM spine, shoulders, hips and knees.  Skin: Intact, no ulcerations or rash noted.  Psych: Good eye contact, normal affect. Memory intact not anxious , easily y tearful not  depressed appearing.  CNS: CN 2-12 intact, power,  normal throughout.no focal deficits noted.   Assessment & Plan  Essential hypertension Not at goal, however , pt stressed and tearful at visit, and has in the past been very intolerant of alternate medications, will continue same. DASH diet and commitment to daily physical activity for a minimum of 30 minutes discussed and encouraged, as a part of hypertension management. The importance of attaining a  healthy weight is also discussed.  BP/Weight 11/16/2016 04/27/2016 12/03/2015 10/05/2015 11/16/2014 11/11/2014 0/38/8828  Systolic BP 003 491 791 505 697 - 948  Diastolic BP 86 82 82 84 81 - 78  Wt. (Lbs) 160 155.12 151.12 150 - 157.6 157.6  BMI 27.46 26.63 25.93 26.15 - 27.92 27.92        Allergic rhinitis Pt to change to nasonex which she buys OTC

## 2016-11-16 NOTE — Patient Instructions (Addendum)
f/u in 1 year, call if you need me before  Medication for your blood pressure is sent for 1 year  All the best with your upcoming job  Call for mammogram through the hospital, free mammograms are offered every year for uninsured in the Fall, call [phone]   You need labs in the Fall as soon as you are able please call us for an appt for your physical and labs

## 2016-11-16 NOTE — Assessment & Plan Note (Signed)
Not at goal, however , pt stressed and tearful at visit, and has in the past been very intolerant of alternate medications, will continue same. DASH diet and commitment to daily physical activity for a minimum of 30 minutes discussed and encouraged, as a part of hypertension management. The importance of attaining a healthy weight is also discussed.  BP/Weight 11/16/2016 04/27/2016 12/03/2015 10/05/2015 11/16/2014 11/11/2014 0/15/6153  Systolic BP 794 327 614 709 295 - 747  Diastolic BP 86 82 82 84 81 - 78  Wt. (Lbs) 160 155.12 151.12 150 - 157.6 157.6  BMI 27.46 26.63 25.93 26.15 - 27.92 27.92

## 2016-11-17 ENCOUNTER — Encounter: Payer: Self-pay | Admitting: Family Medicine

## 2017-05-06 ENCOUNTER — Other Ambulatory Visit: Payer: Self-pay | Admitting: Family Medicine

## 2017-05-07 ENCOUNTER — Telehealth: Payer: Self-pay | Admitting: Family Medicine

## 2017-05-07 NOTE — Telephone Encounter (Signed)
Patient calling about Rx Temazepam.  She states [facility] has not heard back from our office. She does not have any left.    cb  [phone]

## 2017-05-10 NOTE — Telephone Encounter (Signed)
Tried to call patient. Med is not on current med list- I have to wait until PCP returns

## 2017-05-29 ENCOUNTER — Ambulatory Visit: Payer: Medicare HMO | Admitting: Family Medicine

## 2017-05-29 ENCOUNTER — Encounter: Payer: Self-pay | Admitting: Family Medicine

## 2017-05-29 VITALS — BP 142/84 | HR 76 | Resp 16 | Ht 64.0 in | Wt 157.0 lb

## 2017-05-29 DIAGNOSIS — Z1231 Encounter for screening mammogram for malignant neoplasm of breast: Secondary | ICD-10-CM | POA: Diagnosis not present

## 2017-05-29 DIAGNOSIS — I10 Essential (primary) hypertension: Secondary | ICD-10-CM

## 2017-05-29 DIAGNOSIS — J0100 Acute maxillary sinusitis, unspecified: Secondary | ICD-10-CM | POA: Diagnosis not present

## 2017-05-29 DIAGNOSIS — F321 Major depressive disorder, single episode, moderate: Secondary | ICD-10-CM | POA: Diagnosis not present

## 2017-05-29 DIAGNOSIS — J209 Acute bronchitis, unspecified: Secondary | ICD-10-CM | POA: Diagnosis not present

## 2017-05-29 MED ORDER — AZITHROMYCIN 250 MG PO TABS: ORAL_TABLET | ORAL | 0 refills | Status: DC

## 2017-05-29 MED ORDER — BENZONATATE 100 MG PO CAPS: 100.0000 mg | ORAL_CAPSULE | Freq: Two times a day (BID) | ORAL | 0 refills | Status: DC | PRN

## 2017-05-29 NOTE — Assessment & Plan Note (Addendum)
PHQ 9 score of 22 wants and needs help refer to psych, not suicidal or homicidal, will wait on psych to prescribe medication

## 2017-05-29 NOTE — Patient Instructions (Addendum)
Welcome to medicare with MD in January  Return in 2 weeks for flu vaccine please  Please schedule your mammogram, call this number [phone]  You are  Treated for bronchitis and sinusitis, 2 medications are sent in  You need to see psychioiatry re depression and are referred [facility]   CBC, fasting lipid, cmp and eGFr, TSH and vit D in January 1 week before welcome to medicare visit with me  Thank you  for choosing [facility]. We consider it a privelige to serve you.  Delivering excellent health care in a caring and  compassionate way is our goal.  Partnering with you,  so that together we can achieve this goal is our strategy.

## 2017-05-29 NOTE — Progress Notes (Signed)
   GAIGE FUSSNER     MRN: [id number]      DOB: 1956-01-10   HPI Shirley Bush  2 week h/o worsening head and chest congestion, associated with  chills intermittently. Nasal drainage has thickened , and is yellowish green, and at times bloody. Sputum is thick and yellow. C/o left  ear pressure,  and sore throat. Increasing fatigue , poor appetitie and sleep disturbed by cough. No improvement with OTC medication. This  Morning awoke with severe headache rated at 8 from back of the neck, no nausea, has poor vision for some time has needed prescription glases She took 2 sudafed and ibuprofen wirth some relief  ROS Denies chest pains, palpitations and leg swelling Denies abdominal pain, nausea, vomiting,diarrhea or constipation.   Denies dysuria, frequency, hesitancy or incontinence. Denies joint pain, swelling and limitation in mobility. Denies headaches, seizures, numbness, or tingling. Denies depression, anxiety or insomnia. Denies skin break down or rash.   PE  BP (!) 150/86   Pulse 76   Resp 16   Ht 5\' 4"  (1.626 m)   Wt 157 lb (71.2 kg)   SpO2 98%   BMI 26.95 kg/m   Patient alert and oriented and in no cardiopulmonary distress.  HEENT: No facial asymmetry, EOMI,   oropharynx pink and moist.  Neck supple no JVD, no mass.Frontal and maxillary sins tenderness  Chest: decreased air entry , scattered crackles and wheezes.  CVS: S1, S2 no murmurs, no S3.Regular rate.  ABD: Soft non tender.   Ext: No edema  MS: Adequate ROM spine, shoulders, hips and knees.  Skin: Intact, no ulcerations or rash noted.  Psych: Good eye contact, normal affect. Memory intact not anxious or depressed appearing.  CNS: CN 2-12 intact, power,  normal throughout.no focal deficits noted.   Assessment & Plan  Depression PHQ 9 score of 22 wants and needs help refer to psych, not suicidal or homicidal, will wait on psych to prescribe medication  Essential hypertension Uncontrolled , has been  taking decongestants and is out of one of her medications, she is to resume all medications as prescribed, return for nurse bP check DASH diet and commitment to daily physical activity for a minimum of 30 minutes discussed and encouraged, as a part of hypertension management. The importance of attaining a healthy weight is also discussed.  BP/Weight 05/29/2017 11/16/2016 04/27/2016 12/03/2015 10/05/2015 11/16/2014 3/33/8329  Systolic BP 191 660 600 459 977 414 -  Diastolic BP 84 86 82 82 84 81 -  Wt. (Lbs) 157 160 155.12 151.12 150 - 157.6  BMI 26.95 27.46 26.63 25.93 26.15 - 27.92       Acute bronchitis Antibiotic and decongestant prescribed  Acute non-recurrent maxillary sinusitis Antibiotic coursde prescribed and saline nasal flushes recommended

## 2017-05-31 ENCOUNTER — Ambulatory Visit (HOSPITAL_COMMUNITY): Payer: Self-pay

## 2017-06-04 ENCOUNTER — Ambulatory Visit (HOSPITAL_COMMUNITY)
Admission: RE | Admit: 2017-06-04 | Discharge: 2017-06-04 | Disposition: A | Discharge status: Home / Self Care | Payer: Medicare HMO | Source: Ambulatory Visit | Attending: Family Medicine | Admitting: Family Medicine

## 2017-06-04 DIAGNOSIS — Z1231 Encounter for screening mammogram for malignant neoplasm of breast: Secondary | ICD-10-CM | POA: Diagnosis not present

## 2017-06-11 DIAGNOSIS — J0100 Acute maxillary sinusitis, unspecified: Secondary | ICD-10-CM | POA: Insufficient documentation

## 2017-06-11 NOTE — Assessment & Plan Note (Signed)
Antibiotic coursde prescribed and saline nasal flushes recommended

## 2017-06-11 NOTE — Assessment & Plan Note (Signed)
Uncontrolled , has been taking decongestants and is out of one of her medications, she is to resume all medications as prescribed, return for nurse bP check DASH diet and commitment to daily physical activity for a minimum of 30 minutes discussed and encouraged, as a part of hypertension management. The importance of attaining a healthy weight is also discussed.  BP/Weight 05/29/2017 11/16/2016 04/27/2016 12/03/2015 10/05/2015 11/16/2014 9/50/7225  Systolic BP 750 518 335 825 189 842 -  Diastolic BP 84 86 82 82 84 81 -  Wt. (Lbs) 157 160 155.12 151.12 150 - 157.6  BMI 26.95 27.46 26.63 25.93 26.15 - 27.92

## 2017-06-11 NOTE — Assessment & Plan Note (Signed)
Antibiotic and decongestant prescribed 

## 2017-06-18 ENCOUNTER — Telehealth (HOSPITAL_COMMUNITY): Payer: Self-pay

## 2017-06-18 NOTE — Telephone Encounter (Addendum)
[facility] Follow up call:   Writer follow up with patient.  Patient reports continued depression and a lack of energy.  Patient reports doing so much for others but she is not doing anytign for herself.   Patient reports that she is helping to care for her two grandchildren.   Writer scheduled an appt with a psychiatrist Dr. Modesta Messing on 07-04-2017 and a therapist Arrie Eastern on 06-28-2017 in [place].

## 2017-06-28 ENCOUNTER — Ambulatory Visit (HOSPITAL_COMMUNITY): Payer: Self-pay | Admitting: Licensed Clinical Social Worker

## 2017-07-02 ENCOUNTER — Telehealth (HOSPITAL_COMMUNITY): Payer: Self-pay
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
## 2017-07-03 NOTE — Progress Notes (Signed)
Psychiatric Initial Adult Assessment   Patient Identification: Shirley Bush MRN:  [id number] Date of Evaluation:  07/04/2017 Referral Source: Dr. Tula Nakayama Chief Complaint:   Chief Complaint    Establish Care; Depression; Psychiatric Evaluation    "I'm stuck in survival mode" Visit Diagnosis:    ICD-10-CM   1. MDD (major depressive disorder), recurrent episode, moderate (HCC) F33.1     History of Present Illness:   Shirley Bush is a 61 y.o. year old female with a history of depression, hypertension, who is referred for depression.   She states that she has been struggling with depression since 2003 when she lost her job.  She has fibromyalgia since 2009 and has been suffering from pain.  She believes her depression has been getting worse over the past several years. She states that "I'm stuck in survival mode."  Although she was approved on disability this September, she needs to use it "to feed everybody." She feels frustrated that her son spends money without enough consideration, referring that he bought a car and has no money to buy a gas.  She notices that she has been getting more irritable lately with her grandchild at home, although she understands that he is a kid.  She does not think it helps him nor her and she hopes to take care of herself.  She also talks about discordance with younger sister.  The patient takes care of her mother full lives in the neighborhood.  Although she used to supply things to her, she has not been able to do it after she got unemployed. She feels exhausted and tends to stay in the house.  She tries to find "peace and let things go." she wants to get back on her life, where she used to enjoy exercise and walking.    She endorses insomnia.  She has fair appetite and concentration.  She reports loss of energy.  She denies SI, HI.  She denies alcohol or drug use. Other ROS as below.   Associated Signs/Symptoms: Depression Symptoms:  depressed  mood, anhedonia, insomnia, fatigue, (Hypo) Manic Symptoms:  denies Anxiety Symptoms:  mild anxiety Psychotic Symptoms:  denies AH, VH, denies paranoia PTSD Symptoms: history of being bullied, denies hypervigilance, nightmares, flashback  Past Psychiatric History:  Outpatient: on and off since 2002 for depression Psychiatry admission: denies Previous suicide attempt: denies Past trials of medication: duloxetine History of violence: denies  Previous Psychotropic Medications: Yes   Substance Abuse History in the last 12 months:  No.  Consequences of Substance Abuse: NA  Past Medical History:  Past Medical History:  Diagnosis Date  . Fibromyalgia   . Hypertension    under control with meds., has been on med. x 4 yr.  . Medial meniscus tear 04/2013   left  . Migraines     Past Surgical History:  Procedure Laterality Date  . [place]  . CESAREAN SECTION  1980  . CESAREAN SECTION  1992  . COLONOSCOPY  02/05/2009  . COLONOSCOPY N/A 03/06/2014   Procedure: COLONOSCOPY;  Surgeon: Danie Binder, MD;  [facility];  Service: Endoscopy;  Laterality: N/A;  10:30 AM  . FINGER SURGERY Right    long finger  . KNEE ARTHROSCOPY WITH MEDIAL MENISECTOMY Left 04/24/2013   Procedure: LEFT KNEE ARTHROSCOPY CHONDROPLASTY WITH MEDIAL MENISECTOMY;  Surgeon: Ninetta Lights, MD;  Location: [facility];  Service: Orthopedics;  Laterality: Left;  . SHOULDER ARTHROSCOPY Left 2009  .  SHOULDER ARTHROSCOPY Right   . SHOULDER ARTHROSCOPY WITH ROTATOR CUFF REPAIR Left 11/25/2004  . TOTAL KNEE ARTHROPLASTY Left 11/11/2014   Procedure: LEFT TOTAL KNEE ARTHROPLASTY;  Surgeon: Kathryne Hitch, MD;  Location: [facility];  Service: Orthopedics;  Laterality: Left;  . TUBAL LIGATION  1992  . UMBILICAL HERNIA REPAIR  10-25-06    Family Psychiatric History:  Father- alcohol use, paternal side- alcohol use,   Family History:  Family History  Problem Relation Age of Onset  .  Hypertension Mother   . Hypertension Father   . Alcohol abuse Father   . Seizures Father   . Migraines Sister   . Thyroid disease Sister   . Colon cancer Sister   . Thyroid disease Sister   . Diabetes Sister     Social History:   Social History   Socioeconomic History  . Marital status: Divorced    Spouse name: None  . Number of children: 3  . Years of education: None  . Highest education level: None  Social Needs  . Financial resource strain: None  . Food insecurity - worry: None  . Food insecurity - inability: None  . Transportation needs - medical: None  . Transportation needs - non-medical: None  Occupational History  . Occupation: employed   Tobacco Use  . Smoking status: Never Smoker  . Smokeless tobacco: Never Used  Substance and Sexual Activity  . Alcohol use: Yes    Comment: occasionally  . Drug use: No  . Sexual activity: No  Other Topics Concern  . None  Social History Narrative  . 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 There are two grandchildren she takes care of Her parents divorced when she was 38 year old. She has limited contact with her father with alcohol use. She has four siblings, she states that they don't communicate well with each other. She has discordance with younger sister Divorced, her ex-husband deceased in 10-25-15  Allergies:  No Known Allergies  Metabolic Disorder Labs: Lab Results  Component Value Date   HGBA1C 5.6 04/25/2016   MPG 114 04/25/2016   MPG 117 (H) 10/07/2015   No results found for: PROLACTIN Lab Results  Component Value Date   CHOL 180 10/07/2015   TRIG 101 10/07/2015   HDL 57 10/07/2015   CHOLHDL 3.2 10/07/2015   VLDL 20 10/07/2015   LDLCALC 103 10/07/2015   LDLCALC 125 (H) 09/24/2014     Current Medications: Current Outpatient Medications  Medication Sig Dispense  Refill  . azithromycin (ZITHROMAX) 250 MG tablet Two tablets on day omne, then one daily  For an additional 4  days 6 tablet 0  . benzonatate (TESSALON) 100 MG capsule Take 1 capsule (100 mg total) 2 (two) times daily as needed by mouth for cough. 14 capsule 0  . lisinopril-hydrochlorothiazide (PRINZIDE,ZESTORETIC) 20-25 MG tablet Take 1 tablet by mouth daily. 90 tablet 3  . mometasone (NASONEX) 50 MCG/ACT nasal spray Place 2 sprays into the nose daily. 17 g 12   No current facility-administered medications for this visit.     Neurologic: Headache: No Seizure: No Paresthesias:No  Musculoskeletal: Strength & Muscle Tone: within normal limits Gait & Station: normal Patient leans: N/A  Psychiatric Specialty Exam: Review of Systems  Musculoskeletal: Positive for joint pain and myalgias.  Psychiatric/Behavioral: Positive for depression. Negative for hallucinations,  memory loss, substance abuse and suicidal ideas. The patient is nervous/anxious and has insomnia.   All other systems reviewed and are negative.   Blood pressure (!) 151/89, pulse 80, height 5\' 4"  (1.626 m), weight 154 lb (69.9 kg), SpO2 97 %.Body mass index is 26.43 kg/m.  General Appearance: Fairly Groomed  Eye Contact:  Good  Speech:  Clear and Coherent  Volume:  Normal  Mood:  Depressed  Affect:  Appropriate, Congruent, Constricted, Depressed and Tearful  Thought Process:  Coherent and Goal Directed  Orientation:  Full (Time, Place, and Person)  Thought Content:  Logical Perceptions: denies AH/VH  Suicidal Thoughts:  No  Homicidal Thoughts:  No  Memory:  Immediate;   Good Recent;   Good Remote;   Good  Judgement:  Good  Insight:  Fair  Psychomotor Activity:  Normal  Concentration:  Concentration: Good and Attention Span: Good  Recall:  Good  Fund of Knowledge:Good  Language: Good  Akathisia:  No  Handed:  Right  AIMS (if indicated):  N/A  Assets:  Communication Skills Desire for Improvement  ADL's:   Intact  Cognition: WNL  Sleep:  poor   Assessment Shirley Bush is a 61 y.o. year old female with a history of depression, hypertension, who is referred for depression.   # MDD, moderate, recurrent without psychotic features Patient endorses neurovegetative symptoms in the setting of unemployment and taking care of her grandchild and discordance with her son at home.  Will start sertraline was up titration to target neurovegetative symptoms. (will not prescribe SNRI at this time given potential side effect of hypertension).  Discussed behavioral activation.  Discussed self compassion.  She will greatly benefit from CBT; make referral.   Plan 1. Start sertraline 25 mg daily for one week, then 50 mg daily  2. Return to clinic in one month for 30 mins 3. Referral for therapy  The patient demonstrates the following risk factors for suicide: Chronic risk factors for suicide include: psychiatric disorder of depression, , medical illness of fibromyalgia and chronic pain. Acute risk factors for suicide include: family or marital conflict and unemployment. Protective factors for this patient include: responsibility to others (children, family), coping skills and hope for the future. Considering these factors, the overall suicide risk at this point appears to be low. Patient is appropriate for outpatient follow up.   Treatment Plan Summary: Plan as above   Norman Clay, MD 12/19/20189:30 AM

## 2017-07-04 ENCOUNTER — Encounter (HOSPITAL_COMMUNITY): Payer: Self-pay | Admitting: Psychiatry

## 2017-07-04 ENCOUNTER — Ambulatory Visit (INDEPENDENT_AMBULATORY_CARE_PROVIDER_SITE_OTHER): Payer: Medicare HMO

## 2017-07-04 ENCOUNTER — Ambulatory Visit (INDEPENDENT_AMBULATORY_CARE_PROVIDER_SITE_OTHER): Payer: Medicare HMO | Admitting: Psychiatry

## 2017-07-04 VITALS — BP 151/89 | HR 80 | Ht 64.0 in | Wt 154.0 lb

## 2017-07-04 DIAGNOSIS — R450 Nervousness: Secondary | ICD-10-CM | POA: Diagnosis not present

## 2017-07-04 DIAGNOSIS — F331 Major depressive disorder, recurrent, moderate: Secondary | ICD-10-CM

## 2017-07-04 DIAGNOSIS — F419 Anxiety disorder, unspecified: Secondary | ICD-10-CM

## 2017-07-04 DIAGNOSIS — G4700 Insomnia, unspecified: Secondary | ICD-10-CM

## 2017-07-04 DIAGNOSIS — Z23 Encounter for immunization: Secondary | ICD-10-CM

## 2017-07-04 DIAGNOSIS — Z811 Family history of alcohol abuse and dependence: Secondary | ICD-10-CM | POA: Diagnosis not present

## 2017-07-04 DIAGNOSIS — M797 Fibromyalgia: Secondary | ICD-10-CM | POA: Diagnosis not present

## 2017-07-04 DIAGNOSIS — I10 Essential (primary) hypertension: Secondary | ICD-10-CM | POA: Diagnosis not present

## 2017-07-04 DIAGNOSIS — F332 Major depressive disorder, recurrent severe without psychotic features: Secondary | ICD-10-CM | POA: Insufficient documentation

## 2017-07-04 MED ORDER — SERTRALINE HCL 50 MG PO TABS: ORAL_TABLET | ORAL | 1 refills | Status: DC

## 2017-07-04 NOTE — Patient Instructions (Signed)
1. Start sertraline 25 mg daily for one week, then 50 mg daily  2. Return to clinic in one month for 30 mins

## 2017-07-05 ENCOUNTER — Encounter (HOSPITAL_COMMUNITY): Payer: Self-pay | Admitting: Licensed Clinical Social Worker

## 2017-07-05 ENCOUNTER — Ambulatory Visit (INDEPENDENT_AMBULATORY_CARE_PROVIDER_SITE_OTHER): Payer: Medicare HMO | Admitting: Licensed Clinical Social Worker

## 2017-07-05 DIAGNOSIS — F331 Major depressive disorder, recurrent, moderate: Secondary | ICD-10-CM | POA: Diagnosis not present
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
## 2017-07-30 ENCOUNTER — Other Ambulatory Visit: Payer: Self-pay | Admitting: Family Medicine

## 2017-07-31 NOTE — Progress Notes (Deleted)
BH MD/PA/NP OP Progress Note  07/31/2017 12:08 PM Shirley Bush  MRN:  [id number]  Chief Complaint:  HPI: *** Visit Diagnosis: No diagnosis found.  Past Psychiatric History:  I have reviewed the patient's psychiatry history in detail and updated the patient record. Outpatient: on and off since 2002 for depression Psychiatry admission: denies Previous suicide attempt: denies Past trials of medication: duloxetine History of violence: denies history of being bullied, denies hypervigilance, nightmares, flashback  Past Medical History:  Past Medical History:  Diagnosis Date  . Fibromyalgia   . Hypertension    under control with meds., has been on med. x 4 yr.  . Medial meniscus tear 04/2013   left  . Migraines     Past Surgical History:  Procedure Laterality Date  . [place]  . CESAREAN SECTION  1980  . CESAREAN SECTION  1992  . COLONOSCOPY  02/05/2009  . COLONOSCOPY N/A 03/06/2014   Procedure: COLONOSCOPY;  Surgeon: Danie Binder, MD;  [facility];  Service: Endoscopy;  Laterality: N/A;  10:30 AM  . FINGER SURGERY Right    long finger  . KNEE ARTHROSCOPY WITH MEDIAL MENISECTOMY Left 04/24/2013   Procedure: LEFT KNEE ARTHROSCOPY CHONDROPLASTY WITH MEDIAL MENISECTOMY;  Surgeon: Ninetta Lights, MD;  Location: [facility];  Service: Orthopedics;  Laterality: Left;  . SHOULDER ARTHROSCOPY Left 2009  . SHOULDER ARTHROSCOPY Right   . SHOULDER ARTHROSCOPY WITH ROTATOR CUFF REPAIR Left 11/25/2004  . TOTAL KNEE ARTHROPLASTY Left 11/11/2014   Procedure: LEFT TOTAL KNEE ARTHROPLASTY;  Surgeon: Kathryne Hitch, MD;  Location: [facility];  Service: Orthopedics;  Laterality: Left;  . TUBAL LIGATION  1992  . UMBILICAL HERNIA REPAIR  2008    Family Psychiatric History: I have reviewed the patient's family history in detail and updated the patient record. Father- alcohol use, paternal side- alcohol use,   Family History:  Family History  Problem  Relation Age of Onset  . Hypertension Mother   . Hypertension Father   . Alcohol abuse Father   . Seizures Father   . Migraines Sister   . Thyroid disease Sister   . Colon cancer Sister   . Thyroid disease Sister   . Diabetes Sister     Social History:  Social History   Socioeconomic History  . Marital status: Divorced    Spouse name: Not on file  . Number of children: 3  . Years of education: Not on file  . Highest education level: Not on file  Social Needs  . Financial resource strain: Not on file  . Food insecurity - worry: Not on file  . Food insecurity - inability: Not on file  . Transportation needs - medical: Not on file  . Transportation needs - non-medical: Not on file  Occupational History  . Occupation: employed   Tobacco Use  . Smoking status: Never Smoker  . Smokeless tobacco: Never Used  Substance and Sexual Activity  . Alcohol use: Yes    Comment: occasionally  . Drug use: No  . Sexual activity: No  Other Topics Concern  . Not on file  Social History Narrative  . 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 There are two grandchildren she takes care of Her parents divorced when she was 9 year old. She has limited contact with her father with alcohol  use. She has four siblings, she states that they don't communicate well with each other. She has discordance with younger sister Divorced, her ex-husband deceased in Oct 16, 2015    Allergies: No Known Allergies  Metabolic Disorder Labs: Lab Results  Component Value Date   HGBA1C 5.6 04/25/2016   MPG 114 04/25/2016   MPG 117 (H) 10/07/2015   No results found for: PROLACTIN Lab Results  Component Value Date   CHOL 180 10/07/2015   TRIG 101 10/07/2015   HDL 57 10/07/2015   CHOLHDL 3.2 10/07/2015   VLDL 20 10/07/2015   LDLCALC 103 10/07/2015   LDLCALC 125 (H) 09/24/2014   Lab Results   Component Value Date   TSH 1.61 04/25/2016   TSH 3.727 02/18/2014    Therapeutic Level Labs: No results found for: LITHIUM No results found for: VALPROATE No components found for:  CBMZ  Current Medications: Current Outpatient Medications  Medication Sig Dispense Refill  . azithromycin (ZITHROMAX) 250 MG tablet Two tablets on day omne, then one daily  For an additional 4  days 6 tablet 0  . benzonatate (TESSALON) 100 MG capsule Take 1 capsule (100 mg total) 2 (two) times daily as needed by mouth for cough. 14 capsule 0  . lisinopril-hydrochlorothiazide (PRINZIDE,ZESTORETIC) 20-25 MG tablet Take 1 tablet by mouth daily. 90 tablet 3  . mometasone (NASONEX) 50 MCG/ACT nasal spray Place 2 sprays into the nose daily. 17 g 12  . sertraline (ZOLOFT) 50 MG tablet Start 25 mg daily for one week, then 50 mg daily 30 tablet 1   No current facility-administered medications for this visit.      Musculoskeletal: Strength & Muscle Tone: within normal limits Gait & Station: normal Patient leans: N/A  Psychiatric Specialty Exam: ROS  There were no vitals taken for this visit.There is no height or weight on file to calculate BMI.  General Appearance: Fairly Groomed  Eye Contact:  Good  Speech:  Clear and Coherent  Volume:  Normal  Mood:  {BHH MOOD:22306}  Affect:  {Affect (PAA):22687}  Thought Process:  Coherent and Goal Directed  Orientation:  Full (Time, Place, and Person)  Thought Content: Logical   Suicidal Thoughts:  {ST/HT (PAA):22692}  Homicidal Thoughts:  {ST/HT (PAA):22692}  Memory:  Immediate;   Good Recent;   Good Remote;   Good  Judgement:  {Judgement (PAA):22694}  Insight:  {Insight (PAA):22695}  Psychomotor Activity:  Normal  Concentration:  Concentration: Good and Attention Span: Good  Recall:  Good  Fund of Knowledge: Good  Language: Good  Akathisia:  No  Handed:  Right  AIMS (if indicated): not done  Assets:  Communication Skills Desire for Improvement   ADL's:  Intact  Cognition: WNL  Sleep:  {BHH GOOD/FAIR/POOR:22877}   Screenings: PHQ2-9     Office Visit from 05/29/2017 in [facility] Office Visit from 04/27/2016 in [facility] Office Visit from 10/05/2015 in [facility] Office Visit from 02/07/2013 in [facility]  PHQ-2 Total Score  6  6  6  3   PHQ-9 Total Score  21  18  18  8        Assessment and Plan:  TAISA DELORIA is a 62 y.o. year old female with a history of depression, hypertension, who presents for follow up appointment for No diagnosis found.  # MDD, moderate, recurrent without psychotic features   Patient endorses neurovegetative symptoms in the setting of unemployment and taking care of her grandchild and discordance with her son at  home.  Will start sertraline was up titration to target neurovegetative symptoms. (will not prescribe SNRI at this time given potential side effect of hypertension).  Discussed behavioral activation.  Discussed self compassion.  She will greatly benefit from CBT; make referral.   Plan 1. Start sertraline 25 mg daily for one week, then 50 mg daily  2. Return to clinic in one month for 30 mins 3. Referral for therapy  The patient demonstrates the following risk factors for suicide: Chronic risk factors for suicide include: psychiatric disorder of depression, , medical illness of fibromyalgia and chronic pain. Acute risk factors for suicide include: family or marital conflict and unemployment. Protective factors for this patient include: responsibility to others (children, family), coping skills and hope for the future. Considering these factors, the overall suicide risk at this point appears to be low. Patient is appropriate for outpatient follow up.   Norman Clay, MD 07/31/2017, 12:08 PM

## 2017-08-01 DIAGNOSIS — I10 Essential (primary) hypertension: Secondary | ICD-10-CM | POA: Diagnosis not present

## 2017-08-02 ENCOUNTER — Telehealth: Payer: Self-pay | Admitting: Family Medicine

## 2017-08-02 LAB — LIPID PANEL
CHOL/HDL RATIO: 4.1 (calc) (ref <5.0)
Cholesterol: 223 mg/dL — ABNORMAL HIGH (ref <200)
HDL: 55 mg/dL (ref >50)
LDL CHOLESTEROL (CALC): 142 mg/dL — AB
NON-HDL CHOLESTEROL (CALC): 168 mg/dL — AB (ref <130)
Triglycerides: 133 mg/dL (ref <150)

## 2017-08-02 LAB — COMPLETE METABOLIC PANEL WITH GFR
AG Ratio: 1.7 (calc) (ref 1.0–2.5)
ALKALINE PHOSPHATASE (APISO): 79 U/L (ref 33–130)
ALT: 31 U/L — AB (ref 6–29)
AST: 32 U/L (ref 10–35)
Albumin: 4.8 g/dL (ref 3.6–5.1)
BUN: 16 mg/dL (ref 7–25)
CALCIUM: 9.8 mg/dL (ref 8.6–10.4)
CO2: 26 mmol/L (ref 20–32)
CREATININE: 1 mg/dL (ref 0.50–1.05)
Chloride: 103 mmol/L (ref 98–110)
GFR, EST NON AFRICAN AMERICAN: 62 mL/min/{1.73_m2} (ref >=60)
GFR, Est African American: 71 mL/min/{1.73_m2} (ref >=60)
Globulin: 2.8 g/dL (calc) (ref 1.9–3.7)
Glucose, Bld: 110 mg/dL (ref 65–139)
POTASSIUM: 4 mmol/L (ref 3.5–5.3)
Sodium: 139 mmol/L (ref 135–146)
Total Bilirubin: 0.4 mg/dL (ref 0.2–1.2)
Total Protein: 7.6 g/dL (ref 6.1–8.1)

## 2017-08-02 LAB — TSH: TSH: 2.46 m[IU]/L (ref 0.40–4.50)

## 2017-08-02 LAB — VITAMIN D 25 HYDROXY (VIT D DEFICIENCY, FRACTURES): Vit D, 25-Hydroxy: 62 ng/mL (ref 30–100)

## 2017-08-02 NOTE — Telephone Encounter (Signed)
Patient is requesting a refill of tessalon capsules Cb#: [place] in [place].

## 2017-08-03 ENCOUNTER — Other Ambulatory Visit: Payer: Self-pay

## 2017-08-03 ENCOUNTER — Ambulatory Visit (HOSPITAL_COMMUNITY): Payer: Self-pay | Admitting: Psychiatry

## 2017-08-03 MED ORDER — BENZONATATE 100 MG PO CAPS: 100.0000 mg | ORAL_CAPSULE | Freq: Two times a day (BID) | ORAL | 0 refills | Status: DC | PRN

## 2017-08-03 NOTE — Telephone Encounter (Signed)
Refill sent.

## 2017-08-06 ENCOUNTER — Ambulatory Visit (HOSPITAL_COMMUNITY): Payer: Self-pay | Admitting: Licensed Clinical Social Worker

## 2017-08-09 ENCOUNTER — Ambulatory Visit (INDEPENDENT_AMBULATORY_CARE_PROVIDER_SITE_OTHER): Payer: Medicare HMO | Admitting: Family Medicine

## 2017-08-09 ENCOUNTER — Encounter: Payer: Self-pay | Admitting: Family Medicine

## 2017-08-09 ENCOUNTER — Other Ambulatory Visit: Payer: Self-pay

## 2017-08-09 VITALS — BP 130/80 | HR 88 | Resp 16 | Ht 64.0 in | Wt 155.1 lb

## 2017-08-09 DIAGNOSIS — Z800 Family history of malignant neoplasm of digestive organs: Secondary | ICD-10-CM

## 2017-08-09 DIAGNOSIS — Z0000 Encounter for general adult medical examination without abnormal findings: Secondary | ICD-10-CM | POA: Diagnosis not present

## 2017-08-09 MED ORDER — MONTELUKAST SODIUM 10 MG PO TABS: 10.0000 mg | ORAL_TABLET | Freq: Every day | ORAL | 3 refills | Status: DC

## 2017-08-09 MED ORDER — BENZONATATE 100 MG PO CAPS: 100.0000 mg | ORAL_CAPSULE | Freq: Two times a day (BID) | ORAL | 0 refills | Status: DC | PRN

## 2017-08-09 NOTE — Patient Instructions (Signed)
Physical exam end August  Call if you need me before  Please reduce fried and fatty foods, change to more fruit and vegetable and less processed foods Water is the drink Check at the [facility] for exercise  EKG is perfect  Thank you  for choosing [facility]. We consider it a privelige to serve you.  Delivering excellent health care in a caring and  compassionate way is our goal.  Partnering with you,  so that together we can achieve this goal is our strategy.   Medications are sent to your pharmacy

## 2017-08-09 NOTE — Progress Notes (Signed)
Preventive Screening-Counseling & Management   Patient present here today for a welcome to  Medicare   wellness visit.   Current Problems (verified)   Medications Prior to Visit Allergies (verified)   PAST HISTORY  Family History (verified)   Social History divorced, with 3 children, disabled 2018 due to fibromyalgia, knee and back problems. Never smoked    Risk Factors  Current exercise habits: no current exercise,  Dietary issues discussed: encouraged heart healthy diet    Cardiac risk factors:hypertension, hyperlipidemia, lack of exercise  Depression Screen  (Note: if answer to either of the following is "Yes", a more complete depression screening is indicated)   Over the past two weeks, have you felt down, depressed or hopeless? yes  Over the past two weeks, have you felt little interest or pleasure in doing things? yes  Have you lost interest or pleasure in daily life? yes Do you often feel hopeless? No  Do you cry easily over simple problems? Yes Being treated by psychiatry currently    Activities of Daily Living  In your present state of health, do you have any difficulty performing the following activities?  Driving?: No Managing money?: No Feeding yourself?:No Getting from bed to chair?:No Climbing a flight of stairs?: if she takes her time and holds onto rail but very limited due to left knee Preparing food and eating?:No Bathing or showering?:No Getting dressed?:No Getting to the toilet?:No Using the toilet?:No Moving around from place to place?: No  Fall Risk Assessment In the past year have you fallen or had a near fall?: once- in the last snow  Are you currently taking any medications that make you dizzy?:No   Hearing Difficulties: No Do you often ask people to speak up or repeat themselves?: has hearing impairement in left ear  Do you experience ringing or noises in your ears?:occasionally  Do you have difficulty understanding soft or whispered  voices?:sometimes  Cognitive Testing  Alert? Yes Normal Appearance?Yes  Oriented to person? Yes Place? Yes  Time? Yes  Displays appropriate judgment?Yes  Can read the correct time from a watch face? yes Are you having problems remembering things? Sometimes with short term memory since starting zoloft   Advanced Directives have been discussed with the patient?Yes, will give information     List the Names of Other Physician/Practitioners you currently use: Dr Charise Carwin- psych    Indicate any recent Medical Services you may have received from other than [facility] providers in the past year (date may be approximate).     Medicare Attestation  I have personally reviewed:  The patient's medical and social history  Their use of alcohol, tobacco or illicit drugs  Their current medications and supplements  The patient's functional ability including ADLs,fall risks, home safety risks, cognitive, and hearing and visual impairment  Diet and physical activities  Evidence for depression or mood disorders  The patient's weight, height, BMI, and visual acuity have been recorded in the chart. I have made referrals, counseling, and provided education to the patient based on review of the above and I have provided the patient with a written personalized care plan for preventive services.    Physical Exam BP 130/80   Pulse 88   Resp 16   Ht 5\' 4"  (1.626 m)   Wt 155 lb 1.9 oz (70.4 kg)   SpO2 97%   BMI 26.63 kg/m   EKG: NSR, no ischemia, no LVH   Assessment & Plan:  Welcome to Medicare preventive visit Annual  exam as documented. Counseling done  re healthy lifestyle involving commitment to 150 minutes exercise per week, heart healthy diet, and attaining healthy weight.The importance of adequate sleep also discussed. Regular seat belt use and home safety, is also discussed. Changes in health habits are decided on by the patient with goals and time frames  set for achieving  them. Immunization and cancer screening needs are specifically addressed at this visit. End of life discussion initiated and material provided, patient has no living will, she is interested in pursuing this EKG Normal , no ischemia or LVH, normal sinus rythm

## 2017-08-09 NOTE — Assessment & Plan Note (Addendum)
Annual exam as documented. Counseling done  re healthy lifestyle involving commitment to 150 minutes exercise per week, heart healthy diet, and attaining healthy weight.The importance of adequate sleep also discussed. Regular seat belt use and home safety, is also discussed. Changes in health habits are decided on by the patient with goals and time frames  set for achieving them. Immunization and cancer screening needs are specifically addressed at this visit. End of life discussion initiated and material provided, patient has no living will, she is interested in pursuing this EKG Normal , no ischemia or LVH, normal sinus rythm

## 2017-08-13 NOTE — Progress Notes (Signed)
[place] MD/PA/NP OP Progress Note  08/15/2017 8:36 AM Shirley Bush  MRN:  [id number]  Chief Complaint:  Chief Complaint    Follow-up; Depression     HPI:  Patient presents for follow-up appointment for depression.  She states that she feels less irritable.  She endorses fatigue and states she has not been able to do anything.  She feels frustrated with herself as she has not been able to do things for her granddaughter.  She feels frustrated with her son, age 13 at home, talking about an episode of buying a car.  She believes that he may be struggling with depression as well. He has a job and stays in the house playing video games. She is concerned about nose congestion and memory loss; she was diagnosed with allergy. She denies worsening in her symptoms after increasing sertraline.  She has been taking care of her mother; bringing her to the appointment.  She states that she is still not able to take care of herself, although she takes care of other people. She wonders how therapy can be helpful for her; she is advised to discuss goals for therapy with her therapist. She endorses initial and middle insomnia.  She has fair appetite.  She has difficulty with concentration.  She has anhedonia.  She denies SI.  She denies anxiety or panic attacks.    Wt Readings from Last 3 Encounters:  08/15/17 154 lb (69.9 kg)  08/09/17 155 lb 1.9 oz (70.4 kg)  07/04/17 154 lb (69.9 kg)    Visit Diagnosis:    ICD-10-CM   1. MDD (major depressive disorder), recurrent episode, moderate ([place]) F33.1     Past Psychiatric History:  I have reviewed the patient's psychiatry history in detail and updated the patient record. Outpatient: on and off since 2002 for depression Psychiatry admission: denies Previous suicide attempt: denies Past trials of medication: duloxetine History of violence: denies   Past Medical History:  Past Medical History:  Diagnosis Date  . Depression 2002  . Fibromyalgia   .  Hypertension 2004  . Medial meniscus tear 04/2013   left  . Migraines     Past Surgical History:  Procedure Laterality Date  . [place]  . CESAREAN SECTION  1980  . CESAREAN SECTION  1992  . COLONOSCOPY  02/05/2009  . COLONOSCOPY N/A 03/06/2014   Procedure: COLONOSCOPY;  Surgeon: Danie Binder, MD;  [facility];  Service: Endoscopy;  Laterality: N/A;  10:30 AM  . FINGER SURGERY Right    long finger  . KNEE ARTHROSCOPY WITH MEDIAL MENISECTOMY Left 04/24/2013   Procedure: LEFT KNEE ARTHROSCOPY CHONDROPLASTY WITH MEDIAL MENISECTOMY;  Surgeon: Ninetta Lights, MD;  Location: [facility];  Service: Orthopedics;  Laterality: Left;  . SHOULDER ARTHROSCOPY Left 2009  . SHOULDER ARTHROSCOPY Right   . SHOULDER ARTHROSCOPY WITH ROTATOR CUFF REPAIR Left 11/25/2004  . TOTAL KNEE ARTHROPLASTY Left 11/11/2014   Procedure: LEFT TOTAL KNEE ARTHROPLASTY;  Surgeon: Kathryne Hitch, MD;  Location: [facility];  Service: Orthopedics;  Laterality: Left;  . TUBAL LIGATION  1992  . UMBILICAL HERNIA REPAIR  2008    Family Psychiatric History: I have reviewed the patient's family history in detail and updated the patient record.  Family History:  Family History  Problem Relation Age of Onset  . Hypertension Mother   . Hypertension Father   . Alcohol abuse Father   . Seizures Father   . Migraines Sister   .  Thyroid disease Sister   . Colon cancer Sister   . Thyroid disease Sister   . Cancer Sister        breast   . Cancer Maternal Aunt        gyne cancer    Social History:  Social History   Socioeconomic History  . Marital status: Divorced    Spouse name: None  . Number of children: 3  . Years of education: None  . Highest education level: None  Social Needs  . Financial resource strain: None  . Food insecurity - worry: None  . Food insecurity - inability: None  . Transportation needs - medical: None  . Transportation needs - non-medical: None   Occupational History  . Occupation: employed   Tobacco Use  . Smoking status: Never Smoker  . Smokeless tobacco: Never Used  Substance and Sexual Activity  . Alcohol use: Yes    Comment: occasionally  . Drug use: No  . Sexual activity: No  Other Topics Concern  . None  Social History Narrative  . None    Allergies: No Known Allergies  Metabolic Disorder Labs: Lab Results  Component Value Date   HGBA1C 5.6 04/25/2016   MPG 114 04/25/2016   MPG 117 (H) 10/07/2015   No results found for: PROLACTIN Lab Results  Component Value Date   CHOL 223 (H) 08/01/2017   TRIG 133 08/01/2017   HDL 55 08/01/2017   CHOLHDL 4.1 08/01/2017   VLDL 20 10/07/2015   LDLCALC 103 10/07/2015   LDLCALC 125 (H) 09/24/2014   Lab Results  Component Value Date   TSH 2.46 08/01/2017   TSH 1.61 04/25/2016    Therapeutic Level Labs: No results found for: LITHIUM No results found for: VALPROATE No components found for:  CBMZ  Current Medications: Current Outpatient Medications  Medication Sig Dispense Refill  . Aspirin-Acetaminophen-Caffeine (EXCEDRIN PO) Take 1 tablet by mouth as needed.    . benzonatate (TESSALON) 100 MG capsule Take 1 capsule (100 mg total) by mouth 2 (two) times daily as needed for cough. 20 capsule 0  . lisinopril-hydrochlorothiazide (PRINZIDE,ZESTORETIC) 20-25 MG tablet Take 1 tablet by mouth daily. 90 tablet 3  . mometasone (NASONEX) 50 MCG/ACT nasal spray Place 2 sprays into the nose daily. 17 g 12  . montelukast (SINGULAIR) 10 MG tablet Take 1 tablet (10 mg total) by mouth at bedtime. 30 tablet 3  . sertraline (ZOLOFT) 50 MG tablet Start 25 mg daily for one week, then 50 mg daily 30 tablet 1   No current facility-administered medications for this visit.      Musculoskeletal: Strength & Muscle Tone: within normal limits Gait & Station: normal Patient leans: N/A  Psychiatric Specialty Exam: Review of Systems  Psychiatric/Behavioral: Positive for depression and  memory loss. Negative for hallucinations, substance abuse and suicidal ideas. The patient is nervous/anxious and has insomnia.   All other systems reviewed and are negative.   Blood pressure (!) 149/93, pulse 95, height 5\' 3"  (1.6 m), weight 154 lb (69.9 kg), SpO2 99 %.Body mass index is 27.28 kg/m.  General Appearance: Fairly Groomed  Eye Contact:  Good  Speech:  Clear and Coherent  Volume:  Normal  Mood:  Depressed  Affect:  Appropriate, Congruent and less restricted, tearful at times  Thought Process:  Coherent and Goal Directed  Orientation:  Full (Time, Place, and Person)  Thought Content: Logical   Suicidal Thoughts:  No  Homicidal Thoughts:  No  Memory:  Immediate;  Good Recent;   Good Remote;   Good  Judgement:  Good  Insight:  Fair  Psychomotor Activity:  Normal  Concentration:  Concentration: Good and Attention Span: Good  Recall:  Good  Fund of Knowledge: Good  Language: Good  Akathisia:  No  Handed:  Right  AIMS (if indicated): not done  Assets:  Communication Skills Desire for Improvement  ADL's:  Intact  Cognition: WNL  Sleep:  Poor   Screenings: PHQ2-9     Office Visit from 05/29/2017 in [facility] Office Visit from 04/27/2016 in [facility] Office Visit from 10/05/2015 in [facility] Office Visit from 02/07/2013 in [facility]  PHQ-2 Total Score  6  6  6  3   PHQ-9 Total Score  21  18  18  8        Assessment and Plan:  Shirley Bush is a 62 y.o. year old female with a history of depression, hypertension , who presents for follow up appointment for MDD (major depressive disorder), recurrent episode, moderate ([place]).  Psychosocial stressors including discordance with her son, taking care of her gland daughter at home, and unemployment.   # MDD, moderate, recurrent without psychotic features Although patient endorses neurovegetative symptoms, there has been some improvement in irritability, will uptitrate  the dose to target residual symptoms of depression.  Noted that patient reports nose congestion and memory loss; it is unclear whether these symptoms are secondary to medication. Will continue to monitor.  We will start trazodone as needed for insomnia.  Discussed behavioral activation.  Discussed self compassion.  She is encouraged to continue to see a therapist.   Plan 1. Increase sertraline 100 mg daily  2. Start Trazodone 25-50 mg at night as needed for sleep 3. Return to clinic in one month for 30 mins  The patient demonstrates the following risk factors for suicide: Chronic risk factors for suicide include: psychiatric disorder of depression, , medical illness of fibromyalgia and chronic pain. Acute risk factors for suicide include: family or marital conflict and unemployment. Protective factors for this patient include: responsibility to others (children, family), coping skills and hope for the future. Considering these factors, the overall suicide risk at this point appears to be low. Patient is appropriate for outpatient follow up.  The duration of this appointment visit was 30 minutes of face-to-face time with the patient.  Greater than 50% of this time was spent in counseling, explanation of  diagnosis, planning of further management, and coordination of care.  Norman Clay, MD 08/15/2017, 8:36 AM

## 2017-08-15 ENCOUNTER — Encounter (HOSPITAL_COMMUNITY): Payer: Self-pay | Admitting: Psychiatry

## 2017-08-15 ENCOUNTER — Ambulatory Visit (HOSPITAL_COMMUNITY): Payer: Medicare HMO | Admitting: Psychiatry

## 2017-08-15 VITALS — BP 149/93 | HR 95 | Ht 63.0 in | Wt 154.0 lb

## 2017-08-15 DIAGNOSIS — F419 Anxiety disorder, unspecified: Secondary | ICD-10-CM

## 2017-08-15 DIAGNOSIS — F331 Major depressive disorder, recurrent, moderate: Secondary | ICD-10-CM | POA: Diagnosis not present

## 2017-08-15 DIAGNOSIS — R450 Nervousness: Secondary | ICD-10-CM | POA: Diagnosis not present

## 2017-08-15 DIAGNOSIS — Z560 Unemployment, unspecified: Secondary | ICD-10-CM

## 2017-08-15 DIAGNOSIS — R0981 Nasal congestion: Secondary | ICD-10-CM

## 2017-08-15 DIAGNOSIS — G4700 Insomnia, unspecified: Secondary | ICD-10-CM | POA: Diagnosis not present

## 2017-08-15 DIAGNOSIS — Z811 Family history of alcohol abuse and dependence: Secondary | ICD-10-CM

## 2017-08-15 DIAGNOSIS — R413 Other amnesia: Secondary | ICD-10-CM

## 2017-08-15 MED ORDER — TRAZODONE HCL 50 MG PO TABS: ORAL_TABLET | ORAL | 0 refills | Status: DC

## 2017-08-15 MED ORDER — SERTRALINE HCL 100 MG PO TABS: 100.0000 mg | ORAL_TABLET | Freq: Every day | ORAL | 0 refills | Status: DC

## 2017-08-15 NOTE — Patient Instructions (Signed)
1. Increase sertraline 100 mg daily  2. Start Trazodone 25-50 mg at night as needed for sleep 3. Return to clinic in one month for 30 mins

## 2017-08-22 ENCOUNTER — Encounter (HOSPITAL_COMMUNITY): Payer: Self-pay | Admitting: Licensed Clinical Social Worker

## 2017-08-22 ENCOUNTER — Ambulatory Visit (HOSPITAL_COMMUNITY): Payer: Medicare HMO | Admitting: Licensed Clinical Social Worker

## 2017-08-22 DIAGNOSIS — F331 Major depressive disorder, recurrent, moderate: Secondary | ICD-10-CM

## 2017-08-22 NOTE — Progress Notes (Signed)
   THERAPIST PROGRESS NOTE  Session Time: 9:00 am-9:40 am  Participation Level: Active  Behavioral Response: CasualAlertDepressed  Type of Therapy: Individual Therapy  Treatment Goals addressed: Coping  Interventions: CBT and Solution Focused  Summary: Shirley Bush is a 62 y.o. female who presents oriented x5 (person, place, situation, time, and object), alert, casually dressed, appropriately groomed, average height, average weight, depressed and cooperative to address mood. Patient has a history of medical treatment including knee replacement and fibromyalgia. Patient has a history of mental health treatment including outpatient therapy and medication management. Patient denies symptoms of mania. Patient denies suicidal and homicidal ideations. Patient denies psychosis including auditory and visual hallucinations. Patient denies substance abuse. Patient denies a history of elopement. Patient is at low risk for lethality.    Physically: Patient reports that she is having aches and pains from fibromyalgia. She also reports occasional back pain. Patient has a desire to work on her health by going to the gym.  Spiritually/values: Patient reported that she has been reading the bible and watching preaching on tv. She has not been to church in [place] and wants to find a new church.  Relationships: No issues identified.  Emotional/Mental/Behavior: Patient reported that she has experienced some increase in motivation. She is able to do more than she has been related to caring for her granddaughter. Patient has worked on accepting where she is at in life (not working, taking care of her granddaughter). Patient noted that she wants to stay healthy and is going to look into joining [employer] at the gym and has made the commitment to go twice a week.   Patient engaged in session. She responded well to interventions. Patient continues to meet criteria for Major depressive disorder, recurrent  episode, moderate. Patient will continue in outpatient therapy due to being the least restrictive service to meet her needs. Patient made minimal progress on her goals.   Suicidal/Homicidal: Negativewithout intent/plan  Therapist Response: Therapist reviewed patient's recent thoughts and behaviors. Therapist utilized CBT to address mood. Therapist processed patient's feelings to identify triggers for mood. Therapist discussed with patient acceptance and how to improve mood.   Plan: Return again in 4 weeks.  Diagnosis: Axis I: Major depressive disorder, recurrent episode, moderate    Axis II: No diagnosis    Glori Bickers, LCSW 08/22/2017

## 2017-09-11 NOTE — Progress Notes (Signed)
[facility] MD/PA/NP OP Progress Note  09/17/2017 9:39 AM Shirley Bush  MRN:  [id number]  Chief Complaint:  Chief Complaint    Depression; Follow-up     HPI:  Patient presents for follow up appointment for depression. She states that although she feels better she still feels "stuck "in "survival mode." She endorses anhedonia; although she will make herself dressed, she does nothing but watching TV.  she feels irritable and she tense to leave the situation when she is with her granddaughter. She states that her granddaughter does not deserve it. She has not gone to church lately due to low motivation and she does not want to be around with her people. She has not started [facility], although she signed up for it. She talks about financial strain of her son, although she tries not feels stressed about it. She is unsure what she wants to do even if she is not depressed. She talks about the time she cared for her children and other people; she states that she has not taken care of herself. She agrees to work on self compassion so that she can support other people.  She endorses insomnia. She feels fatigue and has anhedonia. She has fair concentration. She does not concern about memory loss she reported at the last visit. She denies SI. She feels anxious and tense at times. She denies panic attacks. She denies HI.   Wt Readings from Last 3 Encounters:  09/17/17 157 lb (71.2 kg)  08/15/17 154 lb (69.9 kg)  08/09/17 155 lb 1.9 oz (70.4 kg)   Visit Diagnosis:    ICD-10-CM   1. MDD (major depressive disorder), recurrent episode, moderate ([place]) F33.1     Past Psychiatric History:  I have reviewed the patient's psychiatry history in detail and updated the patient record. Outpatient:on and off since 2002 for depression Psychiatry admission:denies Previous suicide attempt:denies Past trials of medication:duloxetine History of violence:denies  Past Medical History:  Past Medical History:  Diagnosis Date   . Depression 2002  . Fibromyalgia   . Hypertension 2004  . Medial meniscus tear 04/2013   left  . Migraines     Past Surgical History:  Procedure Laterality Date  . [place]  . CESAREAN SECTION  1980  . CESAREAN SECTION  1992  . COLONOSCOPY  02/05/2009  . COLONOSCOPY N/A 03/06/2014   Procedure: COLONOSCOPY;  Surgeon: Danie Binder, MD;  [facility];  Service: Endoscopy;  Laterality: N/A;  10:30 AM  . FINGER SURGERY Right    long finger  . KNEE ARTHROSCOPY WITH MEDIAL MENISECTOMY Left 04/24/2013   Procedure: LEFT KNEE ARTHROSCOPY CHONDROPLASTY WITH MEDIAL MENISECTOMY;  Surgeon: Ninetta Lights, MD;  Location: [facility];  Service: Orthopedics;  Laterality: Left;  . SHOULDER ARTHROSCOPY Left 2009  . SHOULDER ARTHROSCOPY Right   . SHOULDER ARTHROSCOPY WITH ROTATOR CUFF REPAIR Left 11/25/2004  . TOTAL KNEE ARTHROPLASTY Left 11/11/2014   Procedure: LEFT TOTAL KNEE ARTHROPLASTY;  Surgeon: Kathryne Hitch, MD;  Location: [place];  Service: Orthopedics;  Laterality: Left;  . TUBAL LIGATION  1992  . UMBILICAL HERNIA REPAIR  2008    Family Psychiatric History: I have reviewed the patient's family history in detail and updated the patient record.  Family History:  Family History  Problem Relation Age of Onset  . Hypertension Mother   . Hypertension Father   . Alcohol abuse Father   . Seizures Father   . Migraines Sister   . Thyroid  disease Sister   . Colon cancer Sister   . Thyroid disease Sister   . Cancer Sister        breast   . Cancer Maternal Aunt        gyne cancer    Social History:  Social History   Socioeconomic History  . Marital status: Divorced    Spouse name: None  . Number of children: 3  . Years of education: None  . Highest education level: None  Social Needs  . Financial resource strain: None  . Food insecurity - worry: None  . Food insecurity - inability: None  . Transportation needs - medical: None  .  Transportation needs - non-medical: None  Occupational History  . Occupation: employed   Tobacco Use  . Smoking status: Never Smoker  . Smokeless tobacco: Never Used  Substance and Sexual Activity  . Alcohol use: Yes    Comment: occasionally  . Drug use: No  . Sexual activity: No  Other Topics Concern  . None  Social History Narrative  . None    Allergies: No Known Allergies  Metabolic Disorder Labs: Lab Results  Component Value Date   HGBA1C 5.6 04/25/2016   MPG 114 04/25/2016   MPG 117 (H) 10/07/2015   No results found for: PROLACTIN Lab Results  Component Value Date   CHOL 223 (H) 08/01/2017   TRIG 133 08/01/2017   HDL 55 08/01/2017   CHOLHDL 4.1 08/01/2017   VLDL 20 10/07/2015   LDLCALC 103 10/07/2015   LDLCALC 125 (H) 09/24/2014   Lab Results  Component Value Date   TSH 2.46 08/01/2017   TSH 1.61 04/25/2016    Therapeutic Level Labs: No results found for: LITHIUM No results found for: VALPROATE No components found for:  CBMZ  Current Medications: Current Outpatient Medications  Medication Sig Dispense Refill  . Aspirin-Acetaminophen-Caffeine (EXCEDRIN PO) Take 1 tablet by mouth as needed.    . benzonatate (TESSALON) 100 MG capsule Take 1 capsule (100 mg total) by mouth 2 (two) times daily as needed for cough. 20 capsule 0  . lisinopril-hydrochlorothiazide (PRINZIDE,ZESTORETIC) 20-25 MG tablet Take 1 tablet by mouth daily. 90 tablet 3  . mometasone (NASONEX) 50 MCG/ACT nasal spray Place 2 sprays into the nose daily. 17 g 12  . montelukast (SINGULAIR) 10 MG tablet Take 1 tablet (10 mg total) by mouth at bedtime. 30 tablet 3  . sertraline (ZOLOFT) 100 MG tablet Take 1 tablet (100 mg total) by mouth at bedtime. 90 tablet 0  . traZODone (DESYREL) 50 MG tablet 25-50 mg at night as needed for sleep 30 tablet 0   No current facility-administered medications for this visit.      Musculoskeletal: Strength & Muscle Tone: within normal limits Gait & Station:  normal Patient leans: N/A  Psychiatric Specialty Exam: Review of Systems  Psychiatric/Behavioral: Positive for depression. Negative for hallucinations, memory loss, substance abuse and suicidal ideas. The patient is nervous/anxious and has insomnia.   All other systems reviewed and are negative.   Blood pressure (!) 168/92, pulse 79, height 5\' 3"  (1.6 m), weight 157 lb (71.2 kg), SpO2 100 %.Body mass index is 27.81 kg/m.  General Appearance: Fairly Groomed  Eye Contact:  Good  Speech:  Clear and Coherent  Volume:  Normal  Mood:  Depressed  Affect:  Appropriate, Congruent, Restricted and Tearful  Thought Process:  Coherent and Goal Directed  Orientation:  Full (Time, Place, and Person)  Thought Content: Logical   Suicidal Thoughts:  No  Homicidal Thoughts:  No  Memory:  Immediate;   Good Recent;   Good Remote;   Good  Judgement:  Good  Insight:  Fair  Psychomotor Activity:  Normal  Concentration:  Concentration: Good and Attention Span: Good  Recall:  Good  Fund of Knowledge: Good  Language: Good  Akathisia:  No  Handed:  Right  AIMS (if indicated): not done  Assets:  Communication Skills Desire for Improvement  ADL's:  Intact  Cognition: WNL  Sleep:  Poor   Screenings: PHQ2-9     Office Visit from 05/29/2017 in [facility] Office Visit from 04/27/2016 in [facility] Office Visit from 10/05/2015 in [facility] Office Visit from 02/07/2013 in [facility]  PHQ-2 Total Score  6  6  6  3   PHQ-9 Total Score  21  18  18  8        Assessment and Plan:  Shirley Bush is a 62 y.o. year old female with a history of depression, hypertension , who presents for follow up appointment for MDD (major depressive disorder), recurrent episode, moderate ([place]).  Psychosocial stressors including discordance with her son, taking care of her gland daughter at home, and unemployment.   # MDD, moderate, recurrent without psychotic  features Although there has been slight improvement in neurovegetative symptoms, patient continues to complain irritability and marked anhedonia. Will up titrate sertraline to target residual symptoms of depression. Will up titrate trazodone as needed for insomnia. Discussed behavioral activation. Discussed self compassion. She will continue to see a therapist.   Plan I have reviewed and updated plans as below 1. Increase sertraline 150 mg daily  2. Increase Trazodone 100 mg at night as needed for sleep 3. Return to clinic in one month for 30 mins - She will try Zumba twice a week with her daughter  The patient demonstrates the following risk factors for suicide: Chronic risk factors for suicide include:psychiatric disorder ofdepression,, medical illnessof fibromyalgiaand chronic pain. Acute risk factorsfor suicide include: family or marital conflict and unemployment. Protective factorsfor this patient include: responsibility to others (children, family), coping skills and hope for the future. Considering these factors, the overall suicide risk at this point appears to below. Patientisappropriate for outpatient follow up.  The duration of this appointment visit was 30 minutes of face-to-face time with the patient.  Greater than 50% of this time was spent in counseling, explanation of  diagnosis, planning of further management, and coordination of care.  Norman Clay, MD 09/17/2017, 9:39 AM

## 2017-09-17 ENCOUNTER — Ambulatory Visit (HOSPITAL_COMMUNITY): Payer: Medicare HMO | Admitting: Psychiatry

## 2017-09-17 ENCOUNTER — Encounter (HOSPITAL_COMMUNITY): Payer: Self-pay | Admitting: Psychiatry

## 2017-09-17 VITALS — BP 168/92 | HR 79 | Ht 63.0 in | Wt 157.0 lb

## 2017-09-17 DIAGNOSIS — G4700 Insomnia, unspecified: Secondary | ICD-10-CM | POA: Diagnosis not present

## 2017-09-17 DIAGNOSIS — R450 Nervousness: Secondary | ICD-10-CM | POA: Diagnosis not present

## 2017-09-17 DIAGNOSIS — F331 Major depressive disorder, recurrent, moderate: Secondary | ICD-10-CM | POA: Diagnosis not present

## 2017-09-17 DIAGNOSIS — Z811 Family history of alcohol abuse and dependence: Secondary | ICD-10-CM | POA: Diagnosis not present

## 2017-09-17 DIAGNOSIS — F419 Anxiety disorder, unspecified: Secondary | ICD-10-CM | POA: Diagnosis not present

## 2017-09-17 MED ORDER — TRAZODONE HCL 100 MG PO TABS: 100.0000 mg | ORAL_TABLET | Freq: Every evening | ORAL | 0 refills | Status: DC | PRN

## 2017-09-17 MED ORDER — SERTRALINE HCL 100 MG PO TABS: 150.0000 mg | ORAL_TABLET | Freq: Every day | ORAL | 0 refills | Status: DC

## 2017-09-17 NOTE — Patient Instructions (Signed)
1. Increase sertraline 150 mg daily  2. Increase Trazodone 100 mg at night as needed for sleep 3. Return to clinic in one month for 30 mins

## 2017-09-20 ENCOUNTER — Ambulatory Visit (HOSPITAL_COMMUNITY): Payer: Self-pay | Admitting: Licensed Clinical Social Worker

## 2017-09-20 ENCOUNTER — Telehealth (HOSPITAL_COMMUNITY): Payer: Self-pay | Admitting: Licensed Clinical Social Worker

## 2017-09-20 NOTE — Telephone Encounter (Signed)
Awesome! Thanks

## 2017-10-16 NOTE — Progress Notes (Deleted)
BH MD/PA/NP OP Progress Note  10/16/2017 9:03 AM Shirley Bush  MRN:  [id number]  Chief Complaint:  HPI: *** Visit Diagnosis: No diagnosis found.  Past Psychiatric History:  I have reviewed the patient's psychiatry history in detail and updated the patient record. Outpatient:on and off since 2002 for depression Psychiatry admission:denies Previous suicide attempt:denies Past trials of medication:duloxetine History of violence:denies    Past Medical History:  Past Medical History:  Diagnosis Date  . Depression 2002  . Fibromyalgia   . Hypertension 2004  . Medial meniscus tear 04/2013   left  . Migraines     Past Surgical History:  Procedure Laterality Date  . [place]  . CESAREAN SECTION  1980  . CESAREAN SECTION  1992  . COLONOSCOPY  02/05/2009  . COLONOSCOPY N/A 03/06/2014   Procedure: COLONOSCOPY;  Surgeon: Danie Binder, MD;  [facility];  Service: Endoscopy;  Laterality: N/A;  10:30 AM  . FINGER SURGERY Right    long finger  . KNEE ARTHROSCOPY WITH MEDIAL MENISECTOMY Left 04/24/2013   Procedure: LEFT KNEE ARTHROSCOPY CHONDROPLASTY WITH MEDIAL MENISECTOMY;  Surgeon: Ninetta Lights, MD;  Location: [facility];  Service: Orthopedics;  Laterality: Left;  . SHOULDER ARTHROSCOPY Left 2009  . SHOULDER ARTHROSCOPY Right   . SHOULDER ARTHROSCOPY WITH ROTATOR CUFF REPAIR Left 11/25/2004  . TOTAL KNEE ARTHROPLASTY Left 11/11/2014   Procedure: LEFT TOTAL KNEE ARTHROPLASTY;  Surgeon: Kathryne Hitch, MD;  Location: [facility];  Service: Orthopedics;  Laterality: Left;  . TUBAL LIGATION  1992  . UMBILICAL HERNIA REPAIR  2008    Family Psychiatric History: I have reviewed the patient's family history in detail and updated the patient record.  Family History:  Family History  Problem Relation Age of Onset  . Hypertension Mother   . Hypertension Father   . Alcohol abuse Father   . Seizures Father   . Migraines Sister   . Thyroid  disease Sister   . Colon cancer Sister   . Thyroid disease Sister   . Cancer Sister        breast   . Cancer Maternal Aunt        gyne cancer    Social History:  Social History   Socioeconomic History  . Marital status: Divorced    Spouse name: Not on file  . Number of children: 3  . Years of education: Not on file  . Highest education level: Not on file  Occupational History  . Occupation: employed   Scientific laboratory technician  . Financial resource strain: Not on file  . Food insecurity:    Worry: Not on file    Inability: Not on file  . Transportation needs:    Medical: Not on file    Non-medical: Not on file  Tobacco Use  . Smoking status: Never Smoker  . Smokeless tobacco: Never Used  Substance and Sexual Activity  . Alcohol use: Yes    Comment: occasionally  . Drug use: No  . Sexual activity: Never  Lifestyle  . Physical activity:    Days per week: Not on file    Minutes per session: Not on file  . Stress: Not on file  Relationships  . Social connections:    Talks on phone: Not on file    Gets together: Not on file    Attends religious service: Not on file    Active member of club or organization: Not on file    Attends  meetings of clubs or organizations: Not on file    Relationship status: Not on file  Other Topics Concern  . Not on file  Social History Narrative  . Not on file    Allergies: No Known Allergies  Metabolic Disorder Labs: Lab Results  Component Value Date   HGBA1C 5.6 04/25/2016   MPG 114 04/25/2016   MPG 117 (H) 10/07/2015   No results found for: PROLACTIN Lab Results  Component Value Date   CHOL 223 (H) 08/01/2017   TRIG 133 08/01/2017   HDL 55 08/01/2017   CHOLHDL 4.1 08/01/2017   VLDL 20 10/07/2015   LDLCALC 142 (H) 08/01/2017   LDLCALC 103 10/07/2015   Lab Results  Component Value Date   TSH 2.46 08/01/2017   TSH 1.61 04/25/2016    Therapeutic Level Labs: No results found for: LITHIUM No results found for: VALPROATE No  components found for:  CBMZ  Current Medications: Current Outpatient Medications  Medication Sig Dispense Refill  . Aspirin-Acetaminophen-Caffeine (EXCEDRIN PO) Take 1 tablet by mouth as needed.    . benzonatate (TESSALON) 100 MG capsule Take 1 capsule (100 mg total) by mouth 2 (two) times daily as needed for cough. 20 capsule 0  . lisinopril-hydrochlorothiazide (PRINZIDE,ZESTORETIC) 20-25 MG tablet Take 1 tablet by mouth daily. 90 tablet 3  . mometasone (NASONEX) 50 MCG/ACT nasal spray Place 2 sprays into the nose daily. 17 g 12  . montelukast (SINGULAIR) 10 MG tablet Take 1 tablet (10 mg total) by mouth at bedtime. 30 tablet 3  . sertraline (ZOLOFT) 100 MG tablet Take 1.5 tablets (150 mg total) by mouth at bedtime. 135 tablet 0  . traZODone (DESYREL) 100 MG tablet Take 1 tablet (100 mg total) by mouth at bedtime as needed for sleep. 90 tablet 0   No current facility-administered medications for this visit.      Musculoskeletal: Strength & Muscle Tone: within normal limits Gait & Station: normal Patient leans: N/A  Psychiatric Specialty Exam: ROS  There were no vitals taken for this visit.There is no height or weight on file to calculate BMI.  General Appearance: Fairly Groomed  Eye Contact:  Good  Speech:  Clear and Coherent  Volume:  Normal  Mood:  {BHH MOOD:22306}  Affect:  {Affect (PAA):22687}  Thought Process:  Coherent and Goal Directed  Orientation:  Full (Time, Place, and Person)  Thought Content: Logical   Suicidal Thoughts:  {ST/HT (PAA):22692}  Homicidal Thoughts:  {ST/HT (PAA):22692}  Memory:  Immediate;   Good Recent;   Good Remote;   Good  Judgement:  {Judgement (PAA):22694}  Insight:  {Insight (PAA):22695}  Psychomotor Activity:  Normal  Concentration:  Concentration: Good and Attention Span: Good  Recall:  Good  Fund of Knowledge: Good  Language: Good  Akathisia:  No  Handed:  Right  AIMS (if indicated): not done  Assets:  Communication Skills Desire  for Improvement  ADL's:  Intact  Cognition: WNL  Sleep:  {BHH GOOD/FAIR/POOR:22877}   Screenings: PHQ2-9     Office Visit from 05/29/2017 in [facility] Office Visit from 04/27/2016 in [facility] Office Visit from 10/05/2015 in [facility] Office Visit from 02/07/2013 in [facility]  PHQ-2 Total Score  6  6  6  3   PHQ-9 Total Score  21  18  18  8        Assessment and Plan:  Shirley Bush is a 62 y.o. year old female with a history of depression, hypertension, who  presents for follow up appointment for No diagnosis found. Psychosocial stressors including discordance with her son,taking care of her gland daughter at home,and unemployment.  # MDD, moderate, recurrent without psychotic features Although there has been slight improvement in neurovegetative symptoms, patient continues to complain irritability and marked anhedonia. Will up titrate sertraline to target residual symptoms of depression. Will up titrate trazodone as needed for insomnia. Discussed behavioral activation. Discussed self compassion. She will continue to see a therapist.   Plan  1. Increase sertraline 150 mg daily  2. Increase Trazodone 100 mg at night as needed for sleep 3.Return to clinic in one month for 30 mins - She will try Zumba twice a week with her daughter  The patient demonstrates the following risk factors for suicide: Chronic risk factors for suicide include:psychiatric disorder ofdepression,, medical illnessof fibromyalgiaand chronic pain. Acute risk factorsfor suicide include: family or marital conflict and unemployment. Protective factorsfor this patient include: responsibility to others (children, family), coping skills and hope for the future. Considering these factors, the overall suicide risk at this point appears to below. Patientisappropriate for outpatient follow up.    Norman Clay, MD 10/16/2017, 9:03 AM

## 2017-10-18 ENCOUNTER — Ambulatory Visit (HOSPITAL_COMMUNITY): Payer: Self-pay | Admitting: Psychiatry

## 2017-10-23 ENCOUNTER — Ambulatory Visit (HOSPITAL_COMMUNITY): Payer: Self-pay | Admitting: Licensed Clinical Social Worker

## 2018-01-23 ENCOUNTER — Ambulatory Visit (INDEPENDENT_AMBULATORY_CARE_PROVIDER_SITE_OTHER): Payer: Medicare HMO | Admitting: Family Medicine

## 2018-01-23 ENCOUNTER — Encounter: Payer: Self-pay | Admitting: Family Medicine

## 2018-01-23 ENCOUNTER — Encounter: Payer: Self-pay | Admitting: Gastroenterology

## 2018-01-23 VITALS — BP 178/100 | HR 86 | Resp 16 | Ht 64.0 in | Wt 156.0 lb

## 2018-01-23 DIAGNOSIS — F331 Major depressive disorder, recurrent, moderate: Secondary | ICD-10-CM | POA: Diagnosis not present

## 2018-01-23 DIAGNOSIS — J309 Allergic rhinitis, unspecified: Secondary | ICD-10-CM | POA: Diagnosis not present

## 2018-01-23 DIAGNOSIS — D219 Benign neoplasm of connective and other soft tissue, unspecified: Secondary | ICD-10-CM

## 2018-01-23 DIAGNOSIS — R198 Other specified symptoms and signs involving the digestive system and abdomen: Secondary | ICD-10-CM | POA: Insufficient documentation

## 2018-01-23 DIAGNOSIS — I10 Essential (primary) hypertension: Secondary | ICD-10-CM

## 2018-01-23 DIAGNOSIS — Z13220 Encounter for screening for lipoid disorders: Secondary | ICD-10-CM | POA: Diagnosis not present

## 2018-01-23 DIAGNOSIS — R102 Pelvic and perineal pain: Secondary | ICD-10-CM | POA: Diagnosis not present

## 2018-01-23 DIAGNOSIS — Z800 Family history of malignant neoplasm of digestive organs: Secondary | ICD-10-CM | POA: Diagnosis not present

## 2018-01-23 MED ORDER — MONTELUKAST SODIUM 10 MG PO TABS: 10.0000 mg | ORAL_TABLET | Freq: Every day | ORAL | 3 refills | Status: DC

## 2018-01-23 MED ORDER — FLUOXETINE HCL 20 MG PO TABS: 20.0000 mg | ORAL_TABLET | Freq: Every day | ORAL | 3 refills | Status: DC

## 2018-01-23 MED ORDER — LISINOPRIL-HYDROCHLOROTHIAZIDE 20-25 MG PO TABS: 1.0000 | ORAL_TABLET | Freq: Every day | ORAL | 3 refills | Status: DC

## 2018-01-23 NOTE — Progress Notes (Signed)
Shirley Bush     MRN: [id number]      DOB: 1956/04/20   HPI Ms. Riederer is here for follow up and re-evaluation of chronic medical conditions, medication management and review of any available recent lab and radiology data.  Preventive health is updated, specifically  Cancer screening and Immunization.   Stopped all medications, for blood pressure, depression inApril, here today with c/o headache, post nasal drainage , wakes with congestion in chest and throat  , neck pain sinus pressure, no current chills or fever, yellow green drainage , using OTC medication, robitussin, Thinks sinuses are messed up feels like her head is in water and her ears are popping When constipated she has lower pelvic pain , feels her fibroids are messed up and that they are enlarging  ROS Denies recent fever or chills.,  Denies chest congestion, productive cough or wheezing. Denies chest pains, palpitations and leg swelling Denies  nausea, vomiting,diarrhea .  C/o change in BM, c/o stringy stool and balls, was due rept colonoscopy in 2020 but will need this brought forward with this complaint Denies dysuria, frequency, hesitancy or incontinence. . C/o  Headaches,denies  seizures, numbness, or tingling. C/o depression, anxiety and insomnia, not suicidal or homicidal Denies skin break down or rash.   PE  BP (!) 178/100   Pulse 86   Resp 16   Ht 5\' 4"  (1.626 m)   Wt 156 lb (70.8 kg)   SpO2 99%   BMI 26.78 kg/m   Patient alert and oriented and in no cardiopulmonary distress.  HEENT: No facial asymmetry, EOMI,   oropharynx pink and moist.  Neck supple no JVD, no mass.Frontal sinus tender mess, TM clear, no cervical adenopathy  Chest: Clear to auscultation bilaterally.  CVS: S1, S2 no murmurs, no S3.Regular rate.  ABD: Soft non tender.   Ext: No edema  MS: Adequate ROM spine, shoulders, hips and knees.  Skin: Intact, no ulcerations or rash noted.  Psych: Poor  eye contact, tearful l affect.  Memory intact both  anxious and  depressed appearing.  CNS: CN 2-12 intact, power,  normal throughout.no focal deficits noted.   Assessment & Plan  Change in bowel movement 2 month h/o change in BM seeing stringy stool and balls more frequently, next scheduled was in 2020, needs to see Dr Oneida Alar  Pelvic pain 2 month h/o increasd pelvic pain and pressure has fibroids concerned about enlargement of pelvic organs reports change in BM, needs rept imaging of pelvis, I would expect her fibroids to be decreasing rather than increasing in size  Essential hypertension Uncontrolled due to non compliance wit medication, patient to resume medication DASH diet and commitment to daily physical activity for a minimum of 30 minutes discussed and encouraged, as a part of hypertension management. The importance of attaining a healthy weight is also discussed.  BP/Weight 01/23/2018 08/09/2017 05/29/2017 11/16/2016 04/27/2016 12/03/2015 7/42/5956  Systolic BP 387 564 332 951 884 166 063  Diastolic BP 016 80 84 86 82 82 84  Wt. (Lbs) 156 155.12 157 160 155.12 151.12 150  BMI 26.78 26.63 26.95 27.46 26.63 25.93 26.15  Some encounter information is confidential and restricted. Go to Review Flowsheets activity to see all data.       MDD (major depressive disorder), recurrent episode, moderate (HCC) Pt to start medication is also referred to therapist she is not suicidal or homicidal  FH: colon cancer in relative <55 years old Pt reporting change  In BM with stringy  stool and balls noted in 01/2018 , needs colonoscopy  Allergic rhinitis Uncontrolled, start daily nasonex and singulair

## 2018-01-23 NOTE — Assessment & Plan Note (Signed)
2 month h/o change in BM seeing stringy stool and balls more frequently, next scheduled was in 2020, needs to see Dr Oneida Alar

## 2018-01-23 NOTE — Assessment & Plan Note (Addendum)
2 month h/o increasd pelvic pain and pressure has fibroids concerned about enlargement of pelvic organs reports change in BM, needs rept imaging of pelvis, I would expect her fibroids to be decreasing rather than increasing in size

## 2018-01-23 NOTE — Patient Instructions (Addendum)
Physical exam in 6 weeks, call if you need me sooner  CBC, chem 7 and eGFR  lipid Today   Start blood pressure medication and medicine for depression and allergies today  You are referred to Dr Fields and for ultrasound of pelvis  Love yourself and be patient with yourself and be kind to yourself   

## 2018-01-24 DIAGNOSIS — I10 Essential (primary) hypertension: Secondary | ICD-10-CM | POA: Diagnosis not present

## 2018-01-24 DIAGNOSIS — Z13220 Encounter for screening for lipoid disorders: Secondary | ICD-10-CM | POA: Diagnosis not present

## 2018-01-24 LAB — LIPID PANEL
CHOLESTEROL: 252 mg/dL — AB (ref <200)
HDL: 64 mg/dL (ref >50)
LDL CHOLESTEROL (CALC): 165 mg/dL — AB
Non-HDL Cholesterol (Calc): 188 mg/dL (calc) — ABNORMAL HIGH (ref <130)
TRIGLYCERIDES: 111 mg/dL (ref <150)
Total CHOL/HDL Ratio: 3.9 (calc) (ref <5.0)

## 2018-01-24 LAB — CBC
HCT: 40.7 % (ref 35.0–45.0)
HEMOGLOBIN: 14.1 g/dL (ref 11.7–15.5)
MCH: 30.9 pg (ref 27.0–33.0)
MCHC: 34.6 g/dL (ref 32.0–36.0)
MCV: 89.1 fL (ref 80.0–100.0)
MPV: 10.5 fL (ref 7.5–12.5)
PLATELETS: 370 10*3/uL (ref 140–400)
RBC: 4.57 10*6/uL (ref 3.80–5.10)
RDW: 12.4 % (ref 11.0–15.0)
WBC: 9 10*3/uL (ref 3.8–10.8)

## 2018-01-24 LAB — BASIC METABOLIC PANEL WITH GFR
BUN: 11 mg/dL (ref 7–25)
CALCIUM: 10.4 mg/dL (ref 8.6–10.4)
CHLORIDE: 101 mmol/L (ref 98–110)
CO2: 29 mmol/L (ref 20–32)
Creat: 0.89 mg/dL (ref 0.50–1.05)
GFR, Est African American: 82 mL/min/{1.73_m2} (ref >=60)
GFR, Est Non African American: 71 mL/min/{1.73_m2} (ref >=60)
GLUCOSE: 102 mg/dL — AB (ref 65–99)
Potassium: 4.6 mmol/L (ref 3.5–5.3)
Sodium: 139 mmol/L (ref 135–146)

## 2018-01-26 NOTE — Assessment & Plan Note (Addendum)
Pt to start medication is also referred to therapist she is not suicidal or homicidal

## 2018-01-26 NOTE — Assessment & Plan Note (Signed)
Uncontrolled, start daily nasonex and singulair

## 2018-01-26 NOTE — Assessment & Plan Note (Signed)
Uncontrolled due to non compliance wit medication, patient to resume medication DASH diet and commitment to daily physical activity for a minimum of 30 minutes discussed and encouraged, as a part of hypertension management. The importance of attaining a healthy weight is also discussed.  BP/Weight 01/23/2018 08/09/2017 05/29/2017 11/16/2016 04/27/2016 12/03/2015 0/34/7425  Systolic BP 956 387 564 332 951 884 166  Diastolic BP 063 80 84 86 82 82 84  Wt. (Lbs) 156 155.12 157 160 155.12 151.12 150  BMI 26.78 26.63 26.95 27.46 26.63 25.93 26.15  Some encounter information is confidential and restricted. Go to Review Flowsheets activity to see all data.

## 2018-01-26 NOTE — Assessment & Plan Note (Signed)
Pt reporting change  In BM with stringy stool and balls noted in 01/2018 , needs colonoscopy

## 2018-02-05 ENCOUNTER — Encounter: Payer: Self-pay | Admitting: Family Medicine

## 2018-02-05 ENCOUNTER — Ambulatory Visit (HOSPITAL_COMMUNITY)
Admission: RE | Admit: 2018-02-05 | Discharge: 2018-02-05 | Disposition: A | Discharge status: Home / Self Care | Payer: Medicare HMO | Source: Ambulatory Visit | Attending: Family Medicine | Admitting: Family Medicine

## 2018-02-05 DIAGNOSIS — R102 Pelvic and perineal pain: Secondary | ICD-10-CM | POA: Diagnosis not present

## 2018-02-05 DIAGNOSIS — D259 Leiomyoma of uterus, unspecified: Secondary | ICD-10-CM | POA: Diagnosis not present

## 2018-02-05 DIAGNOSIS — D219 Benign neoplasm of connective and other soft tissue, unspecified: Secondary | ICD-10-CM

## 2018-02-11 ENCOUNTER — Telehealth: Payer: Self-pay | Admitting: Family Medicine

## 2018-02-11 NOTE — Telephone Encounter (Signed)
She is actually being TREATED by psychiatry , so that is the Doc to increase her fluoxetine dose, pls explain this to her

## 2018-02-11 NOTE — Telephone Encounter (Signed)
Patient requesting a dosage increase on her Prozac for anxiety. Cb# [phone]

## 2018-02-12 NOTE — Telephone Encounter (Signed)
Left message on personal voicemail to call psychiatry

## 2018-03-11 ENCOUNTER — Ambulatory Visit (INDEPENDENT_AMBULATORY_CARE_PROVIDER_SITE_OTHER): Payer: Medicare HMO | Admitting: Family Medicine

## 2018-03-11 ENCOUNTER — Other Ambulatory Visit: Payer: Self-pay

## 2018-03-11 ENCOUNTER — Encounter: Payer: Self-pay | Admitting: Family Medicine

## 2018-03-11 VITALS — BP 128/80 | HR 85 | Resp 12 | Ht 63.0 in | Wt 157.1 lb

## 2018-03-11 DIAGNOSIS — F331 Major depressive disorder, recurrent, moderate: Secondary | ICD-10-CM | POA: Diagnosis not present

## 2018-03-11 DIAGNOSIS — Z800 Family history of malignant neoplasm of digestive organs: Secondary | ICD-10-CM

## 2018-03-11 DIAGNOSIS — D259 Leiomyoma of uterus, unspecified: Secondary | ICD-10-CM

## 2018-03-11 DIAGNOSIS — M797 Fibromyalgia: Secondary | ICD-10-CM

## 2018-03-11 DIAGNOSIS — R102 Pelvic and perineal pain: Secondary | ICD-10-CM

## 2018-03-11 DIAGNOSIS — Z0000 Encounter for general adult medical examination without abnormal findings: Secondary | ICD-10-CM | POA: Diagnosis not present

## 2018-03-11 DIAGNOSIS — Z23 Encounter for immunization: Secondary | ICD-10-CM

## 2018-03-11 DIAGNOSIS — D219 Benign neoplasm of connective and other soft tissue, unspecified: Secondary | ICD-10-CM

## 2018-03-11 DIAGNOSIS — Z1231 Encounter for screening mammogram for malignant neoplasm of breast: Secondary | ICD-10-CM

## 2018-03-11 MED ORDER — FLUOXETINE HCL 40 MG PO CAPS: 40.0000 mg | ORAL_CAPSULE | Freq: Every day | ORAL | 1 refills | Status: DC

## 2018-03-11 MED ORDER — PREDNISONE 5 MG PO TABS: ORAL_TABLET | ORAL | 0 refills | Status: DC

## 2018-03-11 NOTE — Progress Notes (Signed)
    Shirley Bush     MRN: [id number]      DOB: October 07, 1955  HPI: Patient is in for annual physical exam. Depression and anxiety not being adequately treated and she wants to return to psychiatry, no real interest in therapy .Takes half trazodone , 50 mg doses 100 is to high for her Got fluoxetine 20 mg in July and August from me, wants higher dose  Right pelvic pain persists  Rated at 10 at times, possible calcification seen in the ovary and unable to comment on endometrial lining, will refer to Dr Glo Herring Immunization is reviewed , and  updated got fl;u vaccine C/o recent fibromyalgia flare   PE: BP 128/80 (BP Location: Left Arm, Patient Position: Sitting, Cuff Size: Normal)   Pulse 85   Resp 12   Ht 5\' 3"  (1.6 m)   Wt 157 lb 1.9 oz (71.3 kg)   SpO2 97% Comment: room air  BMI 27.83 kg/m   Pleasant  female, alert and oriented x 3, in no cardio-pulmonary distress. Afebrile. HEENT No facial trauma or asymetry. Sinuses non tender.  Extra occullar muscles intact, pupils equally reactive to light. External ears normal, tympanic membranes clear. Oropharynx moist, no exudate. Neck: supple, no adenopathy,JVD or thyromegaly.No bruits.  Chest: Clear to ascultation bilaterally.No crackles or wheezes. Non tender to palpation  Breast: No asymetry,no masses or lumps. No tenderness. No nipple discharge or inversion. No axillary or supraclavicular adenopathy  Cardiovascular system; Heart sounds normal,  S1 and  S2 ,no S3.  No murmur, or thrill. Apical beat not displaced Peripheral pulses normal.  Abdomen: Soft, non tender, no organomegaly or masses. No bruits. Bowel sounds normal. No guarding, tenderness or rebound. Colon screen: need to return stool cards x 3    GU: Not examined, she is being referred to gyne for further eva;luation of pelvic pain, her recent pelvic US is abnormal but non specific Musculoskeletal exam: Full ROM of spine, hips , shoulders and knees. No  deformity ,swelling or crepitus noted. Multiple areas of  trigger point tenderness.  Neurologic: Cranial nerves 2 to 12 intact. Power, tone ,sensation and reflexes normal throughout. No disturbance in gait. No tremor.  Skin: Intact, no ulceration, erythema , scaling or rash noted. Pigmentation normal throughout  Psych; Depressed  mood and tearful affect. Judgement and concentration normal   Assessment & Plan:  Annual physical exam Annual exam as documented. Counseling done  re healthy lifestyle involving commitment to 150 minutes exercise per week, heart healthy diet, and attaining healthy weight.The importance of adequate sleep also discussed. Regular seat belt use and home safety, is also discussed. Changes in health habits are decided on by the patient with goals and time frames  set for achieving them. Immunization and cancer screening needs are specifically addressed at this visit.   Pelvic pain Daily right pelvic pain rated at times at 10, abnormal [place], right ovary may have calcification, multiple fibroids , unable to comment on endometrial thickness, refer gyne  MDD (major depressive disorder), recurrent episode, moderate (HCC) Increase fluoxetine to 40 mg and pt to make appt with Pschiatry and therapist  Need for immunization against influenza After obtaining informed consent, the vaccine is  administered by LPN.   FH: colon cancer in relative <80 years old Should have colonoscopy every 5 years as has a sibling diagnosed with colon cancer under age 32  Fibromyalgia Flare of generalized pain x more than 3 month, short term prednisone

## 2018-03-11 NOTE — Assessment & Plan Note (Signed)
Increase fluoxetine to 40 mg and pt to make appt with Pschiatry and therapist

## 2018-03-11 NOTE — Patient Instructions (Addendum)
F/U in 2nd week in December, no labs for December visit call if you need me sooner  Please schedule mammogram appt at checkout, due November  20 or after  Flu vaccine today  Please leave with 3 stool cards to be returned for testing  Higher dose of fluoxetine and you Need to make appt with Psychiatrist , I am sending her a message also   You are referred to Dr Glo Herring re pelvic of pain and abnormal [place]  Keep low fat diet for health   5 days of prednisone are prescribed for fibromylagia, also pls go to the [facility]

## 2018-03-11 NOTE — Assessment & Plan Note (Signed)
After obtaining informed consent, the vaccine is  administered by LPN.  

## 2018-03-11 NOTE — Assessment & Plan Note (Signed)
Flare of generalized pain x more than 3 month, short term prednisone

## 2018-03-11 NOTE — Assessment & Plan Note (Signed)
Daily right pelvic pain rated at times at 10, abnormal [place], right ovary may have calcification, multiple fibroids , unable to comment on endometrial thickness, refer gyne

## 2018-03-11 NOTE — Assessment & Plan Note (Signed)
Should have colonoscopy every 5 years as has a sibling diagnosed with colon cancer under age 60

## 2018-03-11 NOTE — Assessment & Plan Note (Signed)

## 2018-03-18 ENCOUNTER — Encounter: Payer: Self-pay | Admitting: Family Medicine

## 2018-03-28 NOTE — Progress Notes (Signed)
BH MD/PA/NP OP Progress Note  04/05/2018 8:58 AM Shirley Bush  MRN:  [id number]  Chief Complaint:  Chief Complaint    Follow-up; Depression; Anxiety     HPI:  Patient presents for follow-up appointment for depression. The last encounter was in March 2019.  Patient was started on fluoxetine, which was uptitrated to 40 mg last months.   Patient states that she discontinued sertraline as she had a headache on higher dose.  She feels the same since the last time she was here.  She reports frustration about her son, and there has been "nothing changed." She also talks about her daughter, who tries to find out what she wants to do.  Her daughter accepted a job in [place], and her daughter is at the patient place. She raises her voice, stating that she does not know what is her daughter's plan. The patient also struggles to do things. Although she wants to try part-time job, she has not done it.  Although she has signed out for [facility], she has not been they are able once. She states that although she knows she can do it, she does not do it. She does not know what she wants to do.  She also talks about financial strain, which makes it difficult for her to continue to see a therapist.  She has insomnia.  She feels fatigued.  She has anhedonia.  She denies SI.  She feels anxious and tense.  She has panic attacks at times.   Visit Diagnosis:    ICD-10-CM   1. MDD (major depressive disorder), recurrent episode, moderate ([place]) F33.1     Past Psychiatric History: Please see initial evaluation for full details. I have reviewed the history. No updates at this time.     Past Medical History:  Past Medical History:  Diagnosis Date  . Depression 2002  . Fibromyalgia   . Hypertension 2004  . Medial meniscus tear 04/2013   left  . Migraines     Past Surgical History:  Procedure Laterality Date  . [place]  . CESAREAN SECTION  1980  . CESAREAN SECTION  1992  . COLONOSCOPY   02/05/2009  . COLONOSCOPY N/A 03/06/2014   Procedure: COLONOSCOPY;  Surgeon: Danie Binder, MD;  [facility];  Service: Endoscopy;  Laterality: N/A;  10:30 AM  . FINGER SURGERY Right    long finger  . KNEE ARTHROSCOPY WITH MEDIAL MENISECTOMY Left 04/24/2013   Procedure: LEFT KNEE ARTHROSCOPY CHONDROPLASTY WITH MEDIAL MENISECTOMY;  Surgeon: Ninetta Lights, MD;  Location: [place];  Service: Orthopedics;  Laterality: Left;  . SHOULDER ARTHROSCOPY Left 2009  . SHOULDER ARTHROSCOPY Right   . SHOULDER ARTHROSCOPY WITH ROTATOR CUFF REPAIR Left 11/25/2004  . TOTAL KNEE ARTHROPLASTY Left 11/11/2014   Procedure: LEFT TOTAL KNEE ARTHROPLASTY;  Surgeon: Kathryne Hitch, MD;  Location: [place];  Service: Orthopedics;  Laterality: Left;  . TUBAL LIGATION  1992  . UMBILICAL HERNIA REPAIR  2008    Family Psychiatric History: Please see initial evaluation for full details. I have reviewed the history. No updates at this time.     Family History:  Family History  Problem Relation Age of Onset  . Hypertension Mother   . Hypertension Father   . Alcohol abuse Father   . Seizures Father   . Migraines Sister   . Thyroid disease Sister   . Colon cancer Sister   . Thyroid disease Sister   . Cancer Sister  breast   . Cancer Maternal Aunt        gyne cancer    Social History:  Social History   Socioeconomic History  . Marital status: Divorced    Spouse name: Not on file  . Number of children: 3  . Years of education: Not on file  . Highest education level: Not on file  Occupational History  . Occupation: employed   Scientific laboratory technician  . Financial resource strain: Not on file  . Food insecurity:    Worry: Not on file    Inability: Not on file  . Transportation needs:    Medical: Not on file    Non-medical: Not on file  Tobacco Use  . Smoking status: Never Smoker  . Smokeless tobacco: Never Used  Substance and Sexual Activity  . Alcohol use: Yes    Comment:  occasionally  . Drug use: No  . Sexual activity: Never  Lifestyle  . Physical activity:    Days per week: Not on file    Minutes per session: Not on file  . Stress: Not on file  Relationships  . Social connections:    Talks on phone: Not on file    Gets together: Not on file    Attends religious service: Not on file    Active member of club or organization: Not on file    Attends meetings of clubs or organizations: Not on file    Relationship status: Not on file  Other Topics Concern  . Not on file  Social History Narrative  . Not on file    Allergies: No Known Allergies  Metabolic Disorder Labs: Lab Results  Component Value Date   HGBA1C 5.6 04/25/2016   MPG 114 04/25/2016   MPG 117 (H) 10/07/2015   No results found for: PROLACTIN Lab Results  Component Value Date   CHOL 252 (H) 01/24/2018   TRIG 111 01/24/2018   HDL 64 01/24/2018   CHOLHDL 3.9 01/24/2018   VLDL 20 10/07/2015   LDLCALC 165 (H) 01/24/2018   LDLCALC 142 (H) 08/01/2017   Lab Results  Component Value Date   TSH 2.46 08/01/2017   TSH 1.61 04/25/2016    Therapeutic Level Labs: No results found for: LITHIUM No results found for: VALPROATE No components found for:  CBMZ  Current Medications: Current Outpatient Medications  Medication Sig Dispense Refill  . Aspirin-Acetaminophen-Caffeine (EXCEDRIN PO) Take 1 tablet by mouth as needed.    . cetirizine (ZYRTEC) 10 MG tablet Take 10 mg by mouth daily.    Marland Kitchen FLUoxetine (PROZAC) 40 MG capsule Take 1 capsule (40 mg total) by mouth daily. 30 capsule 1  . lisinopril-hydrochlorothiazide (PRINZIDE,ZESTORETIC) 20-25 MG tablet Take 1 tablet by mouth daily. 30 tablet 3  . mometasone (NASONEX) 50 MCG/ACT nasal spray Place 2 sprays into the nose daily. 17 g 12  . predniSONE (DELTASONE) 5 MG tablet Ome tablet two times daily 10 tablet 0  . FLUoxetine (PROZAC) 20 MG capsule Take 60 mg daily (40 mg + 20 mg) 90 capsule 0  . FLUoxetine (PROZAC) 40 MG capsule Take 60  mg daily (40 mg + 20 mg) 90 capsule 0  . traZODone (DESYREL) 50 MG tablet Take 1 tablet (50 mg total) by mouth at bedtime. 90 tablet 0   No current facility-administered medications for this visit.      Musculoskeletal: Strength & Muscle Tone: within normal limits Gait & Station: normal Patient leans: N/A  Psychiatric Specialty Exam: Review of Systems  Psychiatric/Behavioral:  Positive for depression. Negative for hallucinations, memory loss, substance abuse and suicidal ideas. The patient is nervous/anxious and has insomnia.   All other systems reviewed and are negative.   Blood pressure (!) 142/90, pulse 88, height 5\' 3"  (1.6 m), weight 155 lb (70.3 kg), SpO2 99 %.Body mass index is 27.46 kg/m.  General Appearance: Fairly Groomed  Eye Contact:  Good  Speech:  Clear and Coherent  Volume:  Normal  Mood:  Anxious and Depressed  Affect:  Appropriate, Congruent and tense at times, restricted  Thought Process:  Coherent  Orientation:  Full (Time, Place, and Person)  Thought Content: Logical   Suicidal Thoughts:  No  Homicidal Thoughts:  No  Memory:  Immediate;   Good  Judgement:  Good  Insight:  Shallow  Psychomotor Activity:  Normal  Concentration:  Concentration: Good and Attention Span: Good  Recall:  Good  Fund of Knowledge: Good  Language: Good  Akathisia:  No  Handed:  Right  AIMS (if indicated): not done  Assets:  Communication Skills Desire for Improvement  ADL's:  Intact  Cognition: WNL  Sleep:  Poor   Screenings: PHQ2-9     Office Visit from 03/11/2018 in [facility] Office Visit from 01/23/2018 in [facility] Office Visit from 05/29/2017 in [facility] Office Visit from 04/27/2016 in [facility] Office Visit from 10/05/2015 in [facility]  PHQ-2 Total Score  6  6  6  6  6   PHQ-9 Total Score  17  18  21  18  18        Assessment and Plan:  TALENE GLASTETTER is a 62 y.o. year old female with a history  of depression, hypertension, who presents for follow up appointment for MDD (major depressive disorder), recurrent episode, moderate ([place]) Psychosocial stressors including discordance with her son,taking care of her gland daughter at home,and unemployment.  # MDD, moderate, recurrent without psychotic features Patient continues to report neurovegetative symptoms and anxiety since the last appointment.  We will do further up titration of fluoxetine to target residual mood symptoms.  We will decrease the dose of trazodone for sleep.  Discussed behavioral activation.  She is encouraged to continue to see her therapist if it is financially feasible.   Plan I have reviewed and updated plans as below 1. Increase fluoxetine 60 mg (40 mg + 20 mg) daily  2. Decrease Trazodone 50 mg at night as needed for sleep 3.  Return to clinic in three months for 15 mins  Past trials of medication:duloxetine, Sertraline (neck pain at 150 mg daily)  The patient demonstrates the following risk factors for suicide: Chronic risk factors for suicide include:psychiatric disorder ofdepression,, medical illnessof fibromyalgiaand chronic pain. Acute risk factorsfor suicide include: family or marital conflict and unemployment. Protective factorsfor this patient include: responsibility to others (children, family), coping skills and hope for the future. Considering these factors, the overall suicide risk at this point appears to below. Patientisappropriate for outpatient follow up.  The duration of this appointment visit was 30 minutes of face-to-face time with the patient.  Greater than 50% of this time was spent in counseling, explanation of  diagnosis, planning of further management, and coordination of care.  Norman Clay, MD 04/05/2018, 8:58 AM

## 2018-03-29 ENCOUNTER — Ambulatory Visit: Payer: Medicare HMO | Admitting: Obstetrics and Gynecology

## 2018-03-29 ENCOUNTER — Encounter: Payer: Self-pay | Admitting: Obstetrics and Gynecology

## 2018-03-29 VITALS — BP 155/93 | HR 71 | Ht 63.0 in | Wt 157.0 lb

## 2018-03-29 DIAGNOSIS — R102 Pelvic and perineal pain: Secondary | ICD-10-CM

## 2018-03-29 DIAGNOSIS — D252 Subserosal leiomyoma of uterus: Secondary | ICD-10-CM

## 2018-03-29 DIAGNOSIS — D250 Submucous leiomyoma of uterus: Secondary | ICD-10-CM

## 2018-03-29 NOTE — Patient Instructions (Signed)
Uterine Fibroids Uterine fibroids are tissue masses (tumors). They are also called leiomyomas. They can develop inside of a woman's womb (uterus). They can grow very large. Fibroids are not cancerous (benign). Most fibroids do not require medical treatment. Follow these instructions at home:  Keep all follow-up visits as told by your doctor. This is important.  Take medicines only as told by your doctor. ? If you were prescribed a hormone treatment, take the hormone medicines exactly as told. ? Do not take aspirin. It can cause bleeding.  Ask your doctor about taking iron pills and increasing the amount of dark green, leafy vegetables in your diet. These actions can help to boost your blood iron levels.  Pay close attention to your period. Tell your doctor about any changes, such as: ? Increased blood flow. This may require you to use more pads or tampons than usual per month. ? A change in the number of days that your period lasts per month. ? A change in symptoms that come with your period, such as back pain or cramping in your belly area (abdomen). Contact a doctor if:  You have pain in your back or the area between your hip bones (pelvic area) that is not controlled by medicines.  You have pain in your abdomen that is not controlled with medicines.  You have an increase in bleeding between and during periods.  You soak tampons or pads in a half hour or less.  You feel lightheaded.  You feel extra tired.  You feel weak. Get help right away if:  You pass out (faint).  You have a sudden increase in pelvic pain. This information is not intended to replace advice given to you by your health care provider. Make sure you discuss any questions you have with your health care provider. Document Released: 08/05/2010 Document Revised: 03/03/2016 Document Reviewed: 12/30/2013 Elsevier Interactive Patient Education  2018 Elsevier Inc.  

## 2018-03-29 NOTE — Progress Notes (Signed)
Patient ID: Shirley Bush, female   DOB: 08/03/1955, 62 y.o.   MRN: [id number]    [facility] Visit  @DATE @            Patient name: Shirley Bush MRN [id number]  Date of birth: [birth year]  CC & HPI:  Shirley Bush is a 62 y.o. female presenting today for abdominal pain  All 3 of her children were c-setion. Gets sharp pain on lower right side. U/s sound was done that showed fibroids. When having bowel movement there is pain in lower abdomen. Even when bowel are loose, there is not as much pain but has pain most days. Denies and postmenopausal bleeding. Had work related injury more than 10 years ago that contributed to early menopause. Associating factors are what she eats.  ROS:  ROS +RLQ pain +uterine fibroids -postmenopausal bleeding -nausea/vomiting -fever -chills All systems are negative except as noted in the HPI and PMH.  Pertinent History Reviewed:   Reviewed: Significant for c-section had CT 15 yrs ago that had the uterus well above the pelvic brim, now it is in the pelvis and retroverted and the right adnexa is immobile. Medical         Past Medical History:  Diagnosis Date   Depression 2002   Fibromyalgia    Hypertension 2004   Medial meniscus tear 04/2013   left   Migraines                               Surgical Hx:    Past Surgical History:  Procedure Laterality Date   CESAREAN SECTION  [place]   COLONOSCOPY  02/05/2009   COLONOSCOPY N/A 03/06/2014   Procedure: COLONOSCOPY;  Surgeon: Danie Binder, MD;  [facility];  Service: Endoscopy;  Laterality: N/A;  10:30 AM   FINGER SURGERY Right    long finger   KNEE ARTHROSCOPY WITH MEDIAL MENISECTOMY Left 04/24/2013   Procedure: LEFT KNEE ARTHROSCOPY CHONDROPLASTY WITH MEDIAL MENISECTOMY;  Surgeon: Ninetta Lights, MD;  Location: [facility];  Service: Orthopedics;  Laterality: Left;   SHOULDER ARTHROSCOPY Left 2009    SHOULDER ARTHROSCOPY Right    SHOULDER ARTHROSCOPY WITH ROTATOR CUFF REPAIR Left 11/25/2004   TOTAL KNEE ARTHROPLASTY Left 11/11/2014   Procedure: LEFT TOTAL KNEE ARTHROPLASTY;  Surgeon: Kathryne Hitch, MD;  Location: [facility];  Service: Orthopedics;  Laterality: Left;   TUBAL LIGATION  8588   UMBILICAL HERNIA REPAIR  2008   Medications: Reviewed & Updated - see associated section                       Current Outpatient Medications:    Aspirin-Acetaminophen-Caffeine (EXCEDRIN PO), Take 1 tablet by mouth as needed., Disp: , Rfl:    cetirizine (ZYRTEC) 10 MG tablet, Take 10 mg by mouth daily., Disp: , Rfl:    FLUoxetine (PROZAC) 40 MG capsule, Take 1 capsule (40 mg total) by mouth daily., Disp: 30 capsule, Rfl: 1   lisinopril-hydrochlorothiazide (PRINZIDE,ZESTORETIC) 20-25 MG tablet, Take 1 tablet by mouth daily., Disp: 30 tablet, Rfl: 3   mometasone (NASONEX) 50 MCG/ACT nasal spray, Place 2 sprays into the nose daily., Disp: 17 g, Rfl: 12   predniSONE (DELTASONE) 5 MG tablet, Ome tablet two times daily, Disp: 10 tablet, Rfl: 0   Social History: Reviewed -  reports  that she has never smoked. She has never used smokeless tobacco.  Objective Findings:  Vitals: There were no vitals taken for this visit.  PHYSICAL EXAMINATION General appearance - alert, well appearing, and in no distress and oriented to person, place, and time Mental status - alert, oriented to person, place, and time, normal mood, behavior, speech, dress, motor activity, and thought processes, affect appropriate to mood  PELVIC Vagina -good vaginal support Cervix - multiparous well supported last pap 20917 normal  Uterus - uterine fibroid 4 cm mass, calcified fibroids  Assessment & Plan:   A:  1.  uterine fibroids 2. Pelvic pressure due to retroverted uterus with fibroids 3.   P:  1.  Repeat CT of abdomen/pelvis 2. Probable hysterectomy p w/u , endometrial biopsy. Last pap 2017 will repeat.    By  signing my name below, I, Samul Dada, attest that this documentation has been prepared under the direction and in the presence of Jonnie Kind, MD. Electronically Signed: [place]. 03/29/18. 11:21 AM.  I personally performed the services described in this documentation, which was SCRIBED in my presence. The recorded information has been reviewed and considered accurate. It has been edited as necessary during review. Jonnie Kind, MD

## 2018-04-04 ENCOUNTER — Telehealth: Payer: Self-pay | Admitting: *Deleted

## 2018-04-04 ENCOUNTER — Other Ambulatory Visit (INDEPENDENT_AMBULATORY_CARE_PROVIDER_SITE_OTHER): Payer: Medicare HMO

## 2018-04-04 DIAGNOSIS — Z1211 Encounter for screening for malignant neoplasm of colon: Secondary | ICD-10-CM

## 2018-04-04 LAB — HEMOCCULT GUIAC POC 1CARD (OFFICE)
Card #2 Fecal Occult Blod, POC: NEGATIVE
Card #3 Fecal Occult Blood, POC: NEGATIVE
FECAL OCCULT BLD: NEGATIVE

## 2018-04-04 NOTE — Telephone Encounter (Signed)
LMOVM for patient to back regarding CT scan appt . Scheduled for 04/23/18 @ [facility] at 9am.  Needs to be NPO 4 hours prior, pick up contrast at [facility] day before and needs lab work before then.

## 2018-04-04 NOTE — Progress Notes (Signed)
Patient returned 3 stool cards. Results entered.

## 2018-04-05 ENCOUNTER — Encounter (HOSPITAL_COMMUNITY): Payer: Self-pay | Admitting: Psychiatry

## 2018-04-05 ENCOUNTER — Other Ambulatory Visit: Payer: Medicare HMO

## 2018-04-05 ENCOUNTER — Ambulatory Visit (HOSPITAL_COMMUNITY): Payer: Medicare HMO | Admitting: Psychiatry

## 2018-04-05 VITALS — BP 142/90 | HR 88 | Ht 63.0 in | Wt 155.0 lb

## 2018-04-05 DIAGNOSIS — D250 Submucous leiomyoma of uterus: Secondary | ICD-10-CM | POA: Diagnosis not present

## 2018-04-05 DIAGNOSIS — F331 Major depressive disorder, recurrent, moderate: Secondary | ICD-10-CM | POA: Diagnosis not present

## 2018-04-05 DIAGNOSIS — F419 Anxiety disorder, unspecified: Secondary | ICD-10-CM | POA: Diagnosis not present

## 2018-04-05 DIAGNOSIS — G4700 Insomnia, unspecified: Secondary | ICD-10-CM | POA: Diagnosis not present

## 2018-04-05 MED ORDER — FLUOXETINE HCL 40 MG PO CAPS: ORAL_CAPSULE | ORAL | 0 refills | Status: DC

## 2018-04-05 MED ORDER — TRAZODONE HCL 50 MG PO TABS: 50.0000 mg | ORAL_TABLET | Freq: Every day | ORAL | 0 refills | Status: DC

## 2018-04-05 MED ORDER — FLUOXETINE HCL 20 MG PO CAPS: ORAL_CAPSULE | ORAL | 0 refills | Status: DC

## 2018-04-05 NOTE — Patient Instructions (Signed)
1. Increase fluoxetine 60 mg (40 mg + 20 mg) daily  2. Decrease Trazodone 50 mg at night as needed for sleep 3.  Return to clinic in three months for 15 mins

## 2018-04-06 LAB — BUN+CREAT
BUN/Creatinine Ratio: 9 — ABNORMAL LOW (ref 12–28)
BUN: 8 mg/dL (ref 8–27)
Creatinine, Ser: 0.85 mg/dL (ref 0.57–1.00)
GFR calc Af Amer: 86 mL/min/{1.73_m2} (ref >59)
GFR calc non Af Amer: 75 mL/min/{1.73_m2} (ref >59)

## 2018-04-08 ENCOUNTER — Ambulatory Visit: Payer: Medicare HMO | Admitting: Obstetrics and Gynecology

## 2018-04-23 ENCOUNTER — Ambulatory Visit (HOSPITAL_COMMUNITY): Payer: Medicare HMO

## 2018-04-24 ENCOUNTER — Ambulatory Visit: Payer: Medicare HMO | Admitting: Obstetrics and Gynecology

## 2018-04-25 ENCOUNTER — Ambulatory Visit (HOSPITAL_COMMUNITY)
Admission: RE | Admit: 2018-04-25 | Discharge: 2018-04-25 | Disposition: A | Discharge status: Home / Self Care | Payer: Medicare HMO | Source: Ambulatory Visit | Attending: Nurse Practitioner | Admitting: Nurse Practitioner

## 2018-04-25 ENCOUNTER — Encounter: Payer: Self-pay | Admitting: Nurse Practitioner

## 2018-04-25 ENCOUNTER — Ambulatory Visit: Payer: Medicare HMO | Admitting: Nurse Practitioner

## 2018-04-25 VITALS — BP 139/78 | HR 86 | Temp 97.8°F | Ht 63.0 in | Wt 157.4 lb

## 2018-04-25 DIAGNOSIS — D259 Leiomyoma of uterus, unspecified: Secondary | ICD-10-CM | POA: Diagnosis not present

## 2018-04-25 DIAGNOSIS — K5900 Constipation, unspecified: Secondary | ICD-10-CM | POA: Diagnosis not present

## 2018-04-25 DIAGNOSIS — R198 Other specified symptoms and signs involving the digestive system and abdomen: Secondary | ICD-10-CM

## 2018-04-25 NOTE — Assessment & Plan Note (Signed)
Change in bowel habits in the form of increased constipation.  This seems to be less organic in nature and more related to increase in iron supplement, as per above.  However, she is due for colonoscopy in the middle of next year.  We will try to treat her constipation as per above.  Depending on her results we may schedule her colonoscopy at her follow-up visit which will be a few months early.  Follow-up in 3 months in our office.

## 2018-04-25 NOTE — Assessment & Plan Note (Addendum)
The patient has been having worsening constipation since beginning of this year.  This occurred about the time she increased her iron supplement.  She does have hard stools but generally no straining.  Has a bowel movement typically every other day, sometimes every day.  At this point her constipation does not seem to be overtly severe.  This is a change in bowel habits although seems more to be medication related than organic.  She has been using Colace as needed and prune juice as needed for her symptoms.  I will have her take Colace once daily, MiraLAX as needed for breakthrough constipation.  Follow-up in 3 months.  We will check an abdominal x-ray for excessive stool burden and any other concerning findings.  Continue other current medications.  She is due for colonoscopy in the middle of next year.

## 2018-04-25 NOTE — Progress Notes (Signed)
Primary Care Physician:  Fayrene Helper, MD Primary Gastroenterologist:  Dr. Oneida Alar  Chief Complaint  Patient presents with  . Constipation    causes abd/back pain to worsen    HPI:   Shirley Bush is a 62 y.o. female who presents for change in bowel habits.  The patient has not been seen by our service since 2015 when she is triaged for screening colonoscopy.  Colonoscopy completed 03/06/2014 which found a 2 mm sessile polyp in the cecum and a 6 mm sessile polyp in the sigmoid colon.  Moderate diverticulosis in the sigmoid colon, moderate internal hemorrhoids.  Surgical pathology found the polyps to be tubular adenoma.  Recommended avoid trigger items to cause bloating, repeat colonoscopy in 5 years (2020).  Today she states she's doing ok overall. Started having worsening constipation earlier this year when she increased her oral iron. She has chronic back pain and constipation is worsening her back pain. Has a bowel movement about every other day or every day. Stools are hard but do not require significant straining. Feels like her fibroid is "blocking her intestines." Has taken stool softeners to help, at one point bid but currently not taking it regularly. Is drinking more prune juice to try and help. Denies hematochezia, melena, fever, chills, unintentional weight loss. Recent iFOBT was negative. Denies chest pain, dyspnea, dizziness, lightheadedness, syncope, near syncope. Denies any other upper or lower GI symptoms.  Has ongoing depression, working with PCP for management. Denies homicidal or suicidal thoughts.  Past Medical History:  Diagnosis Date  . Depression 2002  . Fibromyalgia   . Hypertension 2004  . Medial meniscus tear 04/2013   left  . Migraines     Past Surgical History:  Procedure Laterality Date  . [place]  . CESAREAN SECTION  1980  . CESAREAN SECTION  1992  . COLONOSCOPY  02/05/2009  . COLONOSCOPY N/A 03/06/2014   Procedure:  COLONOSCOPY;  Surgeon: Danie Binder, MD;  [facility];  Service: Endoscopy;  Laterality: N/A;  10:30 AM  . FINGER SURGERY Right    long finger  . KNEE ARTHROSCOPY WITH MEDIAL MENISECTOMY Left 04/24/2013   Procedure: LEFT KNEE ARTHROSCOPY CHONDROPLASTY WITH MEDIAL MENISECTOMY;  Surgeon: Ninetta Lights, MD;  Location: [facility];  Service: Orthopedics;  Laterality: Left;  . SHOULDER ARTHROSCOPY Left 2009  . SHOULDER ARTHROSCOPY Right   . SHOULDER ARTHROSCOPY WITH ROTATOR CUFF REPAIR Left 11/25/2004  . TOTAL KNEE ARTHROPLASTY Left 11/11/2014   Procedure: LEFT TOTAL KNEE ARTHROPLASTY;  Surgeon: Kathryne Hitch, MD;  Location: [facility];  Service: Orthopedics;  Laterality: Left;  . TUBAL LIGATION  1992  . UMBILICAL HERNIA REPAIR  2008    Current Outpatient Medications  Medication Sig Dispense Refill  . Aspirin-Acetaminophen-Caffeine (EXCEDRIN PO) Take 1 tablet by mouth as needed.    . cetirizine (ZYRTEC) 10 MG tablet Take 10 mg by mouth as needed.     Marland Kitchen FLUoxetine (PROZAC) 20 MG capsule Take 60 mg daily (40 mg + 20 mg) 90 capsule 0  . FLUoxetine (PROZAC) 40 MG capsule Take 1 capsule (40 mg total) by mouth daily. 30 capsule 1  . ibuprofen (ADVIL,MOTRIN) 200 MG tablet Take 400-600 mg by mouth every 6 (six) hours as needed.    Marland Kitchen lisinopril-hydrochlorothiazide (PRINZIDE,ZESTORETIC) 20-25 MG tablet Take 1 tablet by mouth daily. 30 tablet 3  . mometasone (NASONEX) 50 MCG/ACT nasal spray Place 2 sprays into the nose daily. 17 g 12  .  montelukast (SINGULAIR) 10 MG tablet Take 10 mg by mouth as needed.    . traZODone (DESYREL) 50 MG tablet Take 1 tablet (50 mg total) by mouth at bedtime. 90 tablet 0   No current facility-administered medications for this visit.     Allergies as of 04/25/2018  . (No Known Allergies)    Family History  Problem Relation Age of Onset  . Hypertension Mother   . Hypertension Father   . Alcohol abuse Father   . Seizures Father   . Migraines  Sister   . Thyroid disease Sister   . Colon cancer Sister   . Thyroid disease Sister   . Cancer Sister        breast   . Cancer Maternal Aunt        gyne cancer    Social History   Socioeconomic History  . Marital status: Divorced    Spouse name: Not on file  . Number of children: 3  . Years of education: Not on file  . Highest education level: Not on file  Occupational History  . Occupation: employed   Scientific laboratory technician  . Financial resource strain: Not on file  . Food insecurity:    Worry: Not on file    Inability: Not on file  . Transportation needs:    Medical: Not on file    Non-medical: Not on file  Tobacco Use  . Smoking status: Never Smoker  . Smokeless tobacco: Never Used  Substance and Sexual Activity  . Alcohol use: Yes    Comment: occasionally  . Drug use: No  . Sexual activity: Never  Lifestyle  . Physical activity:    Days per week: Not on file    Minutes per session: Not on file  . Stress: Not on file  Relationships  . Social connections:    Talks on phone: Not on file    Gets together: Not on file    Attends religious service: Not on file    Active member of club or organization: Not on file    Attends meetings of clubs or organizations: Not on file    Relationship status: Not on file  . Intimate partner violence:    Fear of current or ex partner: Not on file    Emotionally abused: Not on file    Physically abused: Not on file    Forced sexual activity: Not on file  Other Topics Concern  . Not on file  Social History Narrative  . Not on file    Review of Systems: Complete ROS negative except as per HPI.    Physical Exam: BP 139/78   Pulse 86   Temp 97.8 F (36.6 C) (Oral)   Ht 5\' 3"  (1.6 m)   Wt 157 lb 6.4 oz (71.4 kg)   BMI 27.88 kg/m  General:   Alert and oriented. Pleasant and cooperative. Well-nourished and well-developed.  Eyes:  Without icterus, sclera clear and conjunctiva pink.  Ears:  Normal auditory  acuity. Cardiovascular:  S1, S2 present without murmurs appreciated. Extremities without clubbing or edema. Respiratory:  Clear to auscultation bilaterally. No wheezes, rales, or rhonchi. No distress.  Gastrointestinal:  +BS, soft, non-tender and non-distended. No HSM noted. No guarding or rebound. No masses appreciated.  Rectal:  Deferred  Musculoskalatal:  Symmetrical without gross deformities. Neurologic:  Alert and oriented x4;  grossly normal neurologically. Psych:  Alert and cooperative. Normal mood and affect. Heme/Lymph/Immune: No excessive bruising noted.    04/25/2018 11:01 AM  Disclaimer: This note was dictated with voice recognition software. Similar sounding words can inadvertently be transcribed and may not be corrected upon review.

## 2018-04-25 NOTE — Patient Instructions (Signed)
1. As we discussed, take Colace stool softener once a day. 2. You can use MiraLAX 17 g (1 dose) once daily, if needed for breakthrough constipation. 3. Have your abdominal x-ray completed when you are able to. 4. Return for follow-up in 3 months. 5. Call us if you have any questions or concerns.  At [facility] we value your feedback. You may receive a survey about your visit today. Please share your experience as we strive to create trusting relationships with our patients to provide genuine, compassionate, quality care.  We appreciate your understanding and patience as we review any laboratory studies, imaging, and other diagnostic tests that are ordered as we care for you. Our office policy is 5 business days for review of these results, and any emergent or urgent results are addressed in a timely manner for your best interest. If you do not hear from our office in 1 week, please contact us.   We also encourage the use of MyChart, which contains your medical information for your review as well. If you are not enrolled in this feature, an access code is on this after visit summary for your convenience. Thank you for allowing [place] to be involved in your care.  It was great to see you today!  I hope you have a great Fall!!

## 2018-04-25 NOTE — Progress Notes (Signed)
cc'ed to pcp °

## 2018-05-06 ENCOUNTER — Telehealth: Payer: Self-pay | Admitting: Obstetrics and Gynecology

## 2018-05-06 ENCOUNTER — Telehealth (HOSPITAL_COMMUNITY): Payer: Self-pay | Admitting: *Deleted

## 2018-05-06 NOTE — Telephone Encounter (Signed)
I would advise her to reinitiate medication if no side effect. Would discuss more at the next visit.

## 2018-05-06 NOTE — Telephone Encounter (Signed)
Dr Modesta Messing Patient called stating that she stopped taking her Prozac the 20 mg + 40 mg + 60 mg was to much  & then she decreased down to 40 mg  & it wasn't effective. She asked if there is something else she can try? She states she is having anxiety now since stopping the Prozac

## 2018-05-06 NOTE — Telephone Encounter (Signed)
Called patient back and informed her to take 4 200mg  tablets of otc ibuprofen. Patient agreeable and has no other questions at this time.

## 2018-05-06 NOTE — Telephone Encounter (Signed)
Continue fluoxetine 40 mg daily. Advise her to make sooner appointment as it is over a month since the last time I see her.

## 2018-05-06 NOTE — Telephone Encounter (Signed)
Dr Modesta Messing Patient has stopped taking even the Prozac 40 mg due to it wasn't working. She's only taking the Trazodone

## 2018-05-06 NOTE — Progress Notes (Signed)
[facility] MD/PA/NP OP Progress Note  05/07/2018 10:53 AM Shirley Bush  MRN:  [id number]  Chief Complaint:  Chief Complaint    Depression; Follow-up     HPI:  Patient presents for follow-up appointment for depression.  She could not continue fluoxetine as it caused her headache.  She has been off medication for a few weeks.  She states that her mood is "horrible."  She reports discordance with her son and her grand child, who is 60 year old.  She also takes care of other granddaughter.  She feels irritable and tries not to act on anger.  She denies any HI.  She helps her family member for transportation as she does not work.  She does not like the way she feels.  She has insomnia.  She feels fatigued.  She feels depressed.  She has difficulty in concentration.  She denies SI.  She feels anxious and tense.  She has occasional panic attacks.   Visit Diagnosis:    ICD-10-CM   1. MDD (major depressive disorder), recurrent episode, moderate ([place]) F33.1     Past Psychiatric History: Please see initial evaluation for full details. I have reviewed the history. No updates at this time.     Past Medical History:  Past Medical History:  Diagnosis Date  . Depression 2002  . Fibromyalgia   . Hypertension 2004  . Medial meniscus tear 04/2013   left  . Migraines     Past Surgical History:  Procedure Laterality Date  . [place]  . CESAREAN SECTION  1980  . CESAREAN SECTION  1992  . COLONOSCOPY  02/05/2009  . COLONOSCOPY N/A 03/06/2014   Procedure: COLONOSCOPY;  Surgeon: Danie Binder, MD;  [facility];  Service: Endoscopy;  Laterality: N/A;  10:30 AM  . FINGER SURGERY Right    long finger  . KNEE ARTHROSCOPY WITH MEDIAL MENISECTOMY Left 04/24/2013   Procedure: LEFT KNEE ARTHROSCOPY CHONDROPLASTY WITH MEDIAL MENISECTOMY;  Surgeon: Ninetta Lights, MD;  Location: [place];  Service: Orthopedics;  Laterality: Left;  . SHOULDER ARTHROSCOPY Left 2009  .  SHOULDER ARTHROSCOPY Right   . SHOULDER ARTHROSCOPY WITH ROTATOR CUFF REPAIR Left 11/25/2004  . TOTAL KNEE ARTHROPLASTY Left 11/11/2014   Procedure: LEFT TOTAL KNEE ARTHROPLASTY;  Surgeon: Kathryne Hitch, MD;  Location: [place];  Service: Orthopedics;  Laterality: Left;  . TUBAL LIGATION  1992  . UMBILICAL HERNIA REPAIR  2008    Family Psychiatric History: Please see initial evaluation for full details. I have reviewed the history. No updates at this time.     Family History:  Family History  Problem Relation Age of Onset  . Hypertension Mother   . Hypertension Father   . Alcohol abuse Father   . Seizures Father   . Migraines Sister   . Thyroid disease Sister   . Colon cancer Sister   . Thyroid disease Sister   . Cancer Sister        breast   . Cancer Maternal Aunt        gyne cancer    Social History:  Social History   Socioeconomic History  . Marital status: Divorced    Spouse name: Not on file  . Number of children: 3  . Years of education: Not on file  . Highest education level: Not on file  Occupational History  . Occupation: employed   Scientific laboratory technician  . Financial resource strain: Not on file  . Food insecurity:  Worry: Not on file    Inability: Not on file  . Transportation needs:    Medical: Not on file    Non-medical: Not on file  Tobacco Use  . Smoking status: Never Smoker  . Smokeless tobacco: Never Used  Substance and Sexual Activity  . Alcohol use: Yes    Comment: occasionally  . Drug use: No  . Sexual activity: Never  Lifestyle  . Physical activity:    Days per week: Not on file    Minutes per session: Not on file  . Stress: Not on file  Relationships  . Social connections:    Talks on phone: Not on file    Gets together: Not on file    Attends religious service: Not on file    Active member of club or organization: Not on file    Attends meetings of clubs or organizations: Not on file    Relationship status: Not on file  Other Topics  Concern  . Not on file  Social History Narrative  . Not on file    Allergies: No Known Allergies  Metabolic Disorder Labs: Lab Results  Component Value Date   HGBA1C 5.6 04/25/2016   MPG 114 04/25/2016   MPG 117 (H) 10/07/2015   No results found for: PROLACTIN Lab Results  Component Value Date   CHOL 252 (H) 01/24/2018   TRIG 111 01/24/2018   HDL 64 01/24/2018   CHOLHDL 3.9 01/24/2018   VLDL 20 10/07/2015   LDLCALC 165 (H) 01/24/2018   LDLCALC 142 (H) 08/01/2017   Lab Results  Component Value Date   TSH 2.46 08/01/2017   TSH 1.61 04/25/2016    Therapeutic Level Labs: No results found for: LITHIUM No results found for: VALPROATE No components found for:  CBMZ  Current Medications: Current Outpatient Medications  Medication Sig Dispense Refill  . Aspirin-Acetaminophen-Caffeine (EXCEDRIN PO) Take 1 tablet by mouth as needed.    . cetirizine (ZYRTEC) 10 MG tablet Take 10 mg by mouth as needed.     Marland Kitchen ibuprofen (ADVIL,MOTRIN) 200 MG tablet Take 400-600 mg by mouth every 6 (six) hours as needed.    Marland Kitchen lisinopril-hydrochlorothiazide (PRINZIDE,ZESTORETIC) 20-25 MG tablet Take 1 tablet by mouth daily. 30 tablet 3  . mometasone (NASONEX) 50 MCG/ACT nasal spray Place 2 sprays into the nose daily. 17 g 12  . montelukast (SINGULAIR) 10 MG tablet Take 10 mg by mouth as needed.    . traZODone (DESYREL) 50 MG tablet Take 1 tablet (50 mg total) by mouth at bedtime. 90 tablet 0  . escitalopram (LEXAPRO) 10 MG tablet 5 mg daily for one week, then 10 mg daily 30 tablet 2   No current facility-administered medications for this visit.      Musculoskeletal: Strength & Muscle Tone: within normal limits Gait & Station: normal Patient leans: N/A  Psychiatric Specialty Exam: Review of Systems  Psychiatric/Behavioral: Positive for depression. Negative for hallucinations, memory loss, substance abuse and suicidal ideas. The patient is nervous/anxious and has insomnia.   All other  systems reviewed and are negative.   Blood pressure (!) 148/90, pulse 85, height 5\' 3"  (1.6 m), weight 159 lb (72.1 kg), SpO2 100 %.Body mass index is 28.17 kg/m.  General Appearance: Fairly Groomed  Eye Contact:  Good  Speech:  Clear and Coherent  Volume:  Normal  Mood:  Depressed  Affect:  Appropriate, Congruent and Restricted  Thought Process:  Coherent  Orientation:  Full (Time, Place, and Person)  Thought Content: Logical  Suicidal Thoughts:  No  Homicidal Thoughts:  No  Memory:  Immediate;   Good  Judgement:  Fair  Insight:  Fair  Psychomotor Activity:  Normal  Concentration:  Concentration: Good and Attention Span: Good  Recall:  Good  Fund of Knowledge: Good  Language: Good  Akathisia:  No  Handed:  Right  AIMS (if indicated): not done  Assets:  Communication Skills Desire for Improvement  ADL's:  Intact  Cognition: WNL  Sleep:  Poor   Screenings: PHQ2-9     Office Visit from 03/11/2018 in [facility] Office Visit from 01/23/2018 in [facility] Office Visit from 05/29/2017 in [facility] Office Visit from 04/27/2016 in [facility] Office Visit from 10/05/2015 in [facility]  PHQ-2 Total Score  6  6  6  6  6   PHQ-9 Total Score  17  18  21  18  18        Assessment and Plan:  Shirley Bush is a 62 y.o. year old female with a history of depression, hypertension, who presents for follow up appointment for MDD (major depressive disorder), recurrent episode, moderate ([facility])Psychosocial stressors including discordance with her son,taking care of her gland daughter at home,and unemployment.  # MDD, moderate, recurrent without psychotic features Patient continues to report neurovegetative symptoms and anxiety since the last appointment.  She could not tolerate up titration of fluoxetine due to headache.  Will try Lexapro at lower dose to target depression.  Noted that although the patient reports interest in  reinitiating duloxetine, will not try at this time given hypertension.  Will continue trazodone as needed for insomnia.  Discussed behavioral activation.   Plan I have reviewed and updated plans as below 1. Hold fluoxetine 2. Start lexapro 5 mg daily for one week, then 10 mg daily  3. Continue Trazodone 50 mg at night as needed for sleep 4. Keep the appointment in December  Past trials of medication:duloxetine, Sertraline (neck pain at 150 mg daily), fluoxetine (bloating, headache),   The patient demonstrates the following risk factors for suicide: Chronic risk factors for suicide include:psychiatric disorder ofdepression,, medical illnessof fibromyalgiaand chronic pain. Acute risk factorsfor suicide include: family or marital conflict and unemployment. Protective factorsfor this patient include: responsibility to others (children, family), coping skills and hope for the future. Considering these factors, the overall suicide risk at this point appears to below. Patientisappropriate for outpatient follow up.  Norman Clay, MD 05/07/2018, 10:53 AM

## 2018-05-06 NOTE — Telephone Encounter (Signed)
Pt having ct scan done and Ferg told her to take 800 ipuprofen  Before and she needs a script called in

## 2018-05-07 ENCOUNTER — Other Ambulatory Visit: Payer: Self-pay | Admitting: Family Medicine

## 2018-05-07 ENCOUNTER — Encounter (HOSPITAL_COMMUNITY): Payer: Self-pay | Admitting: Psychiatry

## 2018-05-07 ENCOUNTER — Ambulatory Visit (INDEPENDENT_AMBULATORY_CARE_PROVIDER_SITE_OTHER): Payer: Medicare HMO | Admitting: Psychiatry

## 2018-05-07 ENCOUNTER — Telehealth: Payer: Self-pay | Admitting: Family Medicine

## 2018-05-07 VITALS — BP 148/90 | HR 85 | Ht 63.0 in | Wt 159.0 lb

## 2018-05-07 DIAGNOSIS — F331 Major depressive disorder, recurrent, moderate: Secondary | ICD-10-CM | POA: Diagnosis not present

## 2018-05-07 DIAGNOSIS — F419 Anxiety disorder, unspecified: Secondary | ICD-10-CM

## 2018-05-07 DIAGNOSIS — G4700 Insomnia, unspecified: Secondary | ICD-10-CM

## 2018-05-07 MED ORDER — ESCITALOPRAM OXALATE 10 MG PO TABS: ORAL_TABLET | ORAL | 2 refills | Status: DC

## 2018-05-07 MED ORDER — TRAZODONE HCL 50 MG PO TABS: 50.0000 mg | ORAL_TABLET | Freq: Every day | ORAL | 0 refills | Status: DC

## 2018-05-07 MED ORDER — IBUPROFEN 800 MG PO TABS: ORAL_TABLET | ORAL | 0 refills | Status: DC

## 2018-05-07 NOTE — Telephone Encounter (Signed)
Medication sent pls let her knopw to use sparingly, max of 4 tablets per week

## 2018-05-07 NOTE — Patient Instructions (Signed)
1. Hold fluoxetine 2. Start lexapro 5 mg daily for one week, then 10 mg daily  3. Continue Trazodone 50 mg at night as needed for sleep 4. Keep the appointment in December

## 2018-05-07 NOTE — Telephone Encounter (Signed)
Pt aware.

## 2018-05-07 NOTE — Telephone Encounter (Signed)
Pt would like to know if you will call her in some 800mg  ibuprofen. For her fibromyalgia

## 2018-05-09 ENCOUNTER — Telehealth: Payer: Self-pay | Admitting: Obstetrics and Gynecology

## 2018-05-09 ENCOUNTER — Ambulatory Visit (HOSPITAL_COMMUNITY)
Admission: RE | Admit: 2018-05-09 | Discharge: 2018-05-09 | Disposition: A | Discharge status: Home / Self Care | Payer: Medicare HMO | Source: Ambulatory Visit | Attending: Obstetrics and Gynecology | Admitting: Obstetrics and Gynecology

## 2018-05-09 DIAGNOSIS — K5730 Diverticulosis of large intestine without perforation or abscess without bleeding: Secondary | ICD-10-CM | POA: Diagnosis not present

## 2018-05-09 DIAGNOSIS — D252 Subserosal leiomyoma of uterus: Secondary | ICD-10-CM | POA: Insufficient documentation

## 2018-05-09 DIAGNOSIS — D250 Submucous leiomyoma of uterus: Secondary | ICD-10-CM | POA: Diagnosis not present

## 2018-05-09 MED ORDER — CIPROFLOXACIN HCL 500 MG PO TABS: 500.0000 mg | ORAL_TABLET | Freq: Two times a day (BID) | ORAL | 0 refills | Status: DC

## 2018-05-09 MED ORDER — IOPAMIDOL (ISOVUE-300) INJECTION 61%
100.0000 mL | Freq: Once | INTRAVENOUS | Status: AC | PRN
  Administered 2018-05-09: 100 mL via INTRAVENOUS

## 2018-05-09 NOTE — Telephone Encounter (Signed)
CT results reviewed, pt denies fever, does note constipation, no diarrhea, no bloody bowel movements. Will start Cipro 500 bid as precaution, and have pt followup with PCP, Dr Moshe Cipro. Pt has f/u appt my office in early Nov.

## 2018-05-10 ENCOUNTER — Ambulatory Visit (HOSPITAL_COMMUNITY): Payer: Medicare HMO

## 2018-05-13 ENCOUNTER — Telehealth: Payer: Self-pay | Admitting: Gastroenterology

## 2018-05-13 NOTE — Telephone Encounter (Signed)
Pts results were sent through mychart on 10/10. Results were given to pt. Pt is on an antibiotic for diverticulitis which was prescribed by another doctor after her ct scan. Pt will call back if she has problems after being off of the antibiotic.

## 2018-05-13 NOTE — Telephone Encounter (Signed)
Pt was calling to see if her results were available. [phone]

## 2018-05-22 ENCOUNTER — Other Ambulatory Visit: Payer: Self-pay | Admitting: Obstetrics and Gynecology

## 2018-05-22 ENCOUNTER — Encounter: Payer: Self-pay | Admitting: Obstetrics and Gynecology

## 2018-05-22 ENCOUNTER — Ambulatory Visit: Payer: Medicare HMO | Admitting: Obstetrics and Gynecology

## 2018-05-22 VITALS — BP 129/93 | HR 78 | Ht 63.0 in | Wt 161.0 lb

## 2018-05-22 DIAGNOSIS — R9389 Abnormal findings on diagnostic imaging of other specified body structures: Secondary | ICD-10-CM

## 2018-05-22 DIAGNOSIS — N858 Other specified noninflammatory disorders of uterus: Secondary | ICD-10-CM | POA: Diagnosis not present

## 2018-05-22 MED ORDER — METRONIDAZOLE 500 MG PO TABS: 500.0000 mg | ORAL_TABLET | Freq: Two times a day (BID) | ORAL | 1 refills | Status: DC

## 2018-05-22 NOTE — Progress Notes (Addendum)
Patient ID: Shirley Bush, female   DOB: 02-Sep-1955, 62 y.o.   MRN: [id number]    [facility] Visit  @DATE @            Patient name: Shirley Bush MRN [id number]  Date of birth: Apr 10, 1956  CC & HPI:  Shirley Bush is a 62 y.o. female presenting today for was put on antibiotics after CT and has had both constipation and diarrhea. In last 6 months has started taking iron supplements and has caused her to have constipation. She has a herniated disk from her fall at work and describes that anytime she has bowel movement she gets pain on her lower right side  ROS:  ROS +constipation and diarrhea +RLQ pain when passing bowel -fever -chills All systems are negative except as noted in the HPI and PMH.  Pertinent History Reviewed:   Reviewed: Medical         Past Medical History:  Diagnosis Date  . Depression 2002  . Fibromyalgia   . Hypertension 2004  . Medial meniscus tear 04/2013   left  . Migraines                               Surgical Hx:    Past Surgical History:  Procedure Laterality Date  . [place]  . CESAREAN SECTION  1980  . CESAREAN SECTION  1992  . COLONOSCOPY  02/05/2009  . COLONOSCOPY N/A 03/06/2014   Procedure: COLONOSCOPY;  Surgeon: Danie Binder, MD;  [facility];  Service: Endoscopy;  Laterality: N/A;  10:30 AM  . FINGER SURGERY Right    long finger  . KNEE ARTHROSCOPY WITH MEDIAL MENISECTOMY Left 04/24/2013   Procedure: LEFT KNEE ARTHROSCOPY CHONDROPLASTY WITH MEDIAL MENISECTOMY;  Surgeon: Ninetta Lights, MD;  Location: [place];  Service: Orthopedics;  Laterality: Left;  . SHOULDER ARTHROSCOPY Left 2009  . SHOULDER ARTHROSCOPY Right   . SHOULDER ARTHROSCOPY WITH ROTATOR CUFF REPAIR Left 11/25/2004  . TOTAL KNEE ARTHROPLASTY Left 11/11/2014   Procedure: LEFT TOTAL KNEE ARTHROPLASTY;  Surgeon: Kathryne Hitch, MD;  Location: [place];  Service: Orthopedics;  Laterality: Left;  . TUBAL LIGATION  1992  .  UMBILICAL HERNIA REPAIR  2008   Medications: Reviewed & Updated - see associated section                       Current Outpatient Medications:  .  Aspirin-Acetaminophen-Caffeine (EXCEDRIN PO), Take 1 tablet by mouth as needed., Disp: , Rfl:  .  cetirizine (ZYRTEC) 10 MG tablet, Take 10 mg by mouth as needed. , Disp: , Rfl:  .  escitalopram (LEXAPRO) 10 MG tablet, 5 mg daily for one week, then 10 mg daily, Disp: 30 tablet, Rfl: 2 .  ibuprofen (ADVIL,MOTRIN) 800 MG tablet, One tablet once daily as needed, for pain, Disp: 30 tablet, Rfl: 0 .  lisinopril-hydrochlorothiazide (PRINZIDE,ZESTORETIC) 20-25 MG tablet, Take 1 tablet by mouth daily., Disp: 30 tablet, Rfl: 3 .  mometasone (NASONEX) 50 MCG/ACT nasal spray, Place 2 sprays into the nose daily., Disp: 17 g, Rfl: 12 .  traZODone (DESYREL) 50 MG tablet, Take 1 tablet (50 mg total) by mouth at bedtime., Disp: 90 tablet, Rfl: 0 .  ciprofloxacin (CIPRO) 500 MG tablet, Take 1 tablet (500 mg total) by mouth 2 (two) times daily. (Patient not taking: Reported on 05/22/2018), Disp: 20 tablet, Rfl:  0 .  montelukast (SINGULAIR) 10 MG tablet, Take 10 mg by mouth as needed., Disp: , Rfl:    Social History: Reviewed -  reports that she has never smoked. She has never used smokeless tobacco.  Objective Findings:  Vitals: Blood pressure (!) 129/93, pulse 78, height 5\' 3"  (1.6 m), weight 161 lb (73 kg).  PHYSICAL EXAMINATION General appearance - alert, well appearing, and in no distress, oriented to person, place and time Mental status - alert, oriented to person, place, and time, appropriate affect to mood Ct Abdomen Pelvis W Contrast  Result Date: 05/09/2018 CLINICAL DATA:  Patient with lower back pain radiating to the left side of the abdomen. EXAM: CT ABDOMEN AND PELVIS WITH CONTRAST TECHNIQUE: Multidetector CT imaging of the abdomen and pelvis was performed using the standard protocol following bolus administration of intravenous contrast. CONTRAST:   160mL ISOVUE-300 IOPAMIDOL (ISOVUE-300) INJECTION 61% COMPARISON:  None. FINDINGS: Lower chest: Normal heart size. Lung bases are clear. No pleural effusion. Hepatobiliary: Liver is normal in size and contour. No focal lesion identified. Gallbladder is mildly distended. No intrahepatic or extrahepatic biliary ductal dilatation. Pancreas: Unremarkable Spleen: Unremarkable Adrenals/Urinary Tract: Normal adrenal glands. Kidneys enhance symmetrically with contrast. No hydronephrosis. Urinary bladder is unremarkable. Stomach/Bowel: Descending and sigmoid colonic diverticulosis. There is focal inflammatory change about the sigmoid colon (image 55; series 2) with small amount of surrounding fat stranding and fluid. Normal appendix. No evidence for small bowel obstruction. Vascular/Lymphatic: Normal caliber abdominal aorta. Peripheral calcified atherosclerotic plaque. No retroperitoneal lymphadenopathy. Reproductive: Large calcified fibroid off the posterior aspect of the uterus measuring 4.1 x 5.3 cm. Additional smaller calcified fibroids are demonstrated. Adnexal structures are unremarkable. Other: None. Musculoskeletal: Lumbar spine degenerative changes. No aggressive or acute appearing osseous lesions. IMPRESSION: 1. Findings compatible with acute sigmoid colonic diverticulitis. No evidence for perforation or surrounding abscess formation. 2. These results will be called to the ordering clinician or representative by the Radiologist Assistant, and communication documented in the PACS or zVision Dashboard. Electronically Signed   By: Lovey Newcomer M.D.   On: 05/09/2018 13:22   Dg Abd 2 Views  Result Date: 04/25/2018 CLINICAL DATA:  Back and pelvic pain for 3 months, some constipation EXAM: ABDOMEN - 2 VIEW COMPARISON:  Lumbar spine films of 12/20/2016 FINDINGS: There is a moderate to moderately large amount of feces throughout the colon. No bowel obstruction is seen. On the erect view no free air is noted. No opaque  calculi are seen. Calcified uterine fibroid is noted in the mid pelvis which is unchanged. No bony abnormality is seen. IMPRESSION: 1. Moderate to moderately large amount of feces throughout the colon. No bowel obstruction. 2. No free air. 3. Calcified uterine fibroid. Electronically Signed   By: Ivar Drape M.D.   On: 04/25/2018 16:33    PELVIC  ENDO BIOPSY Patient given informed consent, signed copy in the chart, time out was performed. Appropriate time out taken. . The patient was placed in the lithotomy position and the cervix brought into view with sterile speculum.  3 cc of 1% lidocaine used, injected at 11 and 1 of anterior lip of cervix. Portio of cervix cleansed x 2 with betadine swabs.  A tenaculum was placed in the anterior lip of the cervix.  The uterus was sounded for depth of 8cm. A pipelle was introduced to into the uterus, suction created,  and an endometrial sample was obtained. All equipment was removed and accounted for.  The patient tolerated the procedure well.  Patient given post procedure instructions. The patient will return in 2 weeks for results.  Assessment & Plan:   A:  1. Pyometra 2. calcified fibroids  3. endometrial debris on u/s  P:  1. Rx Metronidazole x 7 days 2. F/u phone call with results 3. F/u in 2 weeks   By signing my name below, I, Samul Dada, attest that this documentation has been prepared under the direction and in the presence of Jonnie Kind, MD. Electronically Signed: [place]. 05/22/18. 11:31 AM.  I personally performed the services described in this documentation, which was SCRIBED in my presence. The recorded information has been reviewed and considered accurate. It has been edited as necessary during review. Jonnie Kind, MD

## 2018-05-28 ENCOUNTER — Telehealth: Payer: Self-pay | Admitting: Obstetrics and Gynecology

## 2018-05-28 NOTE — Telephone Encounter (Signed)
Patient informed of benign endometrial biopsy. Pt having no gyn complaints. Followup prn bleeding or other gyn c/o

## 2018-06-06 ENCOUNTER — Ambulatory Visit: Payer: Medicare HMO | Admitting: Obstetrics and Gynecology

## 2018-06-10 ENCOUNTER — Other Ambulatory Visit: Payer: Self-pay | Admitting: Obstetrics and Gynecology

## 2018-06-10 MED ORDER — FLUCONAZOLE 150 MG PO TABS: 150.0000 mg | ORAL_TABLET | Freq: Once | ORAL | 0 refills | Status: AC

## 2018-06-10 NOTE — Progress Notes (Signed)
C/o vaginal itching. Will tx diflucan as pt just finished antibiotics. If not releived, will need to be seen.

## 2018-06-17 ENCOUNTER — Encounter (HOSPITAL_COMMUNITY): Payer: Self-pay

## 2018-06-17 ENCOUNTER — Ambulatory Visit (HOSPITAL_COMMUNITY)
Admission: RE | Admit: 2018-06-17 | Discharge: 2018-06-17 | Disposition: A | Discharge status: Home / Self Care | Payer: Medicare HMO | Source: Ambulatory Visit | Attending: Family Medicine | Admitting: Family Medicine

## 2018-06-17 ENCOUNTER — Ambulatory Visit (HOSPITAL_COMMUNITY): Payer: Self-pay

## 2018-06-17 DIAGNOSIS — Z1231 Encounter for screening mammogram for malignant neoplasm of breast: Secondary | ICD-10-CM

## 2018-06-26 ENCOUNTER — Ambulatory Visit (INDEPENDENT_AMBULATORY_CARE_PROVIDER_SITE_OTHER): Payer: Medicare HMO | Admitting: Family Medicine

## 2018-06-26 ENCOUNTER — Encounter: Payer: Self-pay | Admitting: Family Medicine

## 2018-06-26 VITALS — BP 144/90 | HR 88 | Resp 14 | Ht 63.0 in | Wt 160.1 lb

## 2018-06-26 DIAGNOSIS — M797 Fibromyalgia: Secondary | ICD-10-CM | POA: Diagnosis not present

## 2018-06-26 DIAGNOSIS — I10 Essential (primary) hypertension: Secondary | ICD-10-CM | POA: Diagnosis not present

## 2018-06-26 DIAGNOSIS — F331 Major depressive disorder, recurrent, moderate: Secondary | ICD-10-CM | POA: Diagnosis not present

## 2018-06-26 DIAGNOSIS — Z13220 Encounter for screening for lipoid disorders: Secondary | ICD-10-CM

## 2018-06-26 MED ORDER — CYCLOBENZAPRINE HCL 10 MG PO TABS: 10.0000 mg | ORAL_TABLET | Freq: Every day | ORAL | 3 refills | Status: DC

## 2018-06-26 MED ORDER — MOMETASONE FUROATE 50 MCG/ACT NA SUSP: 2.0000 | Freq: Every day | NASAL | 12 refills | Status: DC

## 2018-06-26 MED ORDER — IBUPROFEN 800 MG PO TABS: ORAL_TABLET | ORAL | 1 refills | Status: DC

## 2018-06-26 MED ORDER — TRIAMTERENE-HCTZ 37.5-25 MG PO TABS: 1.0000 | ORAL_TABLET | Freq: Every day | ORAL | 2 refills | Status: DC

## 2018-06-26 NOTE — Progress Notes (Signed)
   Shirley Bush     MRN: [id number]      DOB: August 10, 1955   HPI Shirley Bush is here for follow up and re-evaluation of chronic medical conditions, medication management and review of any available recent lab and radiology data.  Preventive health is updated, specifically  Cancer screening and Immunization.   Questions or concerns regarding consultations or procedures which the PT has had in the interim are  Addressed.Being treated by psychiatry for depression and reports some improvement C/o dry cough with ACE and has f/h of ACE allergy Reports resolution of her pelvic pain, has had biopsy of thickened endometrium, good result, responded to antibiotic C/o generalized pain from fibromyalgia  ROS Denies recent fever or chills. Denies sinus pressure, nasal congestion, ear pain or sore throat. Denies chest congestion, productive cough or wheezing. Denies chest pains, palpitations and leg swelling Denies abdominal pain, nausea, vomiting,diarrhea or constipation.   Denies dysuria, frequency, hesitancy or incontinence. . Denies headaches, seizures, numbness, or tingling. c/o depression, anxiety not suicidal or homicidal, less tearful, treated  insomnia. Denies skin break down or rash.   PE  BP (!) 144/90   Pulse 88   Resp 14   Ht 5\' 3"  (1.6 m)   Wt 160 lb 1.3 oz (72.6 kg)   SpO2 100% Comment: room air  BMI 28.36 kg/m   Patient alert and oriented and in no cardiopulmonary distress.  HEENT: No facial asymmetry, EOMI,   oropharynx pink and moist.  Neck supple no JVD, no mass.  Chest: Clear to auscultation bilaterally.  CVS: S1, S2 no murmurs, no S3.Regular rate.  ABD: Soft non tender.   Ext: No edema  MS: Adequate ROM spine, shoulders, hips and knees.  Skin: Intact, no ulcerations or rash noted.  Psych: Good eye contact, normal affect. Memory intact not anxious or depressed appearing.  CNS: CN 2-12 intact, power,  normal throughout.no focal deficits noted.   Assessment &  Plan  Essential hypertension Not at goal and pt has ACE allergy, change to triamterene daily DASH diet and commitment to daily physical activity for a minimum of 30 minutes discussed and encouraged, as a part of hypertension management. The importance of attaining a healthy weight is also discussed.  BP/Weight 06/26/2018 05/22/2018 04/25/2018 03/29/2018 03/11/2018 01/23/2018 4/69/6295  Systolic BP 284 132 440 102 725 366 440  Diastolic BP 90 93 78 93 80 100 80  Wt. (Lbs) 160.08 161 157.4 157 157.12 156 155.12  BMI 28.36 28.52 27.88 27.81 27.83 26.78 26.63  Some encounter information is confidential and restricted. Go to Review Flowsheets activity to see all data.       MDD (major depressive disorder), recurrent episode, moderate ([place]) Being treaTED BY  PSYCHIATRY AND REPORTS SOME IMPROVEMENT, STILL NEEDS TO FULLY ESTABLISH WITH THERAPIST  Fibromyalgia Increased and uncontrolled generalized muscle aches and localized tenderness, start bedtime flexeril

## 2018-06-26 NOTE — Assessment & Plan Note (Signed)
Not at goal and pt has ACE allergy, change to triamterene daily DASH diet and commitment to daily physical activity for a minimum of 30 minutes discussed and encouraged, as a part of hypertension management. The importance of attaining a healthy weight is also discussed.  BP/Weight 06/26/2018 05/22/2018 04/25/2018 03/29/2018 03/11/2018 01/23/2018 9/60/4540  Systolic BP 981 191 478 295 621 308 657  Diastolic BP 90 93 78 93 80 100 80  Wt. (Lbs) 160.08 161 157.4 157 157.12 156 155.12  BMI 28.36 28.52 27.88 27.81 27.83 26.78 26.63  Some encounter information is confidential and restricted. Go to Review Flowsheets activity to see all data.

## 2018-06-26 NOTE — Assessment & Plan Note (Signed)
Increased and uncontrolled generalized muscle aches and localized tenderness, start bedtime flexeril

## 2018-06-26 NOTE — Assessment & Plan Note (Signed)
Being treaTED BY  PSYCHIATRY AND REPORTS SOME IMPROVEMENT, STILL NEEDS TO FULLY ESTABLISH WITH THERAPIST

## 2018-06-26 NOTE — Patient Instructions (Addendum)
Wellness with nurse due in January, please schedule in 4 weeks, blood pressure to be re evalautesd at that visit  F/U with MD first week in April, call if you need me before  New medication for blood pressure, triamterene start taking tonight, no more Zestoretic, due to dry cough/ allergy  New for fibromyalgia is bedtime flexeril  Fasting lipid, cmp and EGGFR, TSH 1 week before April visit  It is important that you exercise regularly at least 30 minutes 5 times a week. If you develop chest pain, have severe difficulty breathing, or feel very tired, stop exercising immediately and seek medical attention  Thank you  for choosing [facility]. We consider it a privelige to serve you.  Delivering excellent health care in a caring and  compassionate way is our goal.  Partnering with you,  so that together we can achieve this goal is our strategy.

## 2018-07-01 NOTE — Progress Notes (Deleted)
BH MD/PA/NP OP Progress Note  07/01/2018 12:27 PM Shirley Bush  MRN:  [id number]  Chief Complaint:  HPI: *** Visit Diagnosis: No diagnosis found.  Past Psychiatric History: Please see initial evaluation for full details. I have reviewed the history. No updates at this time.     Past Medical History:  Past Medical History:  Diagnosis Date  . Depression 2002  . Fibromyalgia   . Hypertension 2004  . Medial meniscus tear 04/2013   left  . Migraines     Past Surgical History:  Procedure Laterality Date  . [place]  . CESAREAN SECTION  1980  . CESAREAN SECTION  1992  . COLONOSCOPY  02/05/2009  . COLONOSCOPY N/A 03/06/2014   Procedure: COLONOSCOPY;  Surgeon: Danie Binder, MD;  [facility];  Service: Endoscopy;  Laterality: N/A;  10:30 AM  . FINGER SURGERY Right    long finger  . KNEE ARTHROSCOPY WITH MEDIAL MENISECTOMY Left 04/24/2013   Procedure: LEFT KNEE ARTHROSCOPY CHONDROPLASTY WITH MEDIAL MENISECTOMY;  Surgeon: Ninetta Lights, MD;  Location: [facility];  Service: Orthopedics;  Laterality: Left;  . SHOULDER ARTHROSCOPY Left 2009  . SHOULDER ARTHROSCOPY Right   . SHOULDER ARTHROSCOPY WITH ROTATOR CUFF REPAIR Left 11/25/2004  . TOTAL KNEE ARTHROPLASTY Left 11/11/2014   Procedure: LEFT TOTAL KNEE ARTHROPLASTY;  Surgeon: Kathryne Hitch, MD;  Location: [facility];  Service: Orthopedics;  Laterality: Left;  . TUBAL LIGATION  1992  . UMBILICAL HERNIA REPAIR  2008    Family Psychiatric History: Please see initial evaluation for full details. I have reviewed the history. No updates at this time.     Family History:  Family History  Problem Relation Age of Onset  . Hypertension Mother   . Hypertension Father   . Alcohol abuse Father   . Seizures Father   . Migraines Sister   . Thyroid disease Sister   . Colon cancer Sister   . Thyroid disease Sister   . Cancer Sister        breast   . Cancer Maternal Aunt        gyne cancer     Social History:  Social History   Socioeconomic History  . Marital status: Divorced    Spouse name: Not on file  . Number of children: 3  . Years of education: Not on file  . Highest education level: Not on file  Occupational History  . Occupation: employed   Scientific laboratory technician  . Financial resource strain: Not on file  . Food insecurity:    Worry: Not on file    Inability: Not on file  . Transportation needs:    Medical: Not on file    Non-medical: Not on file  Tobacco Use  . Smoking status: Never Smoker  . Smokeless tobacco: Never Used  Substance and Sexual Activity  . Alcohol use: Yes    Comment: occasionally  . Drug use: No  . Sexual activity: Never  Lifestyle  . Physical activity:    Days per week: Not on file    Minutes per session: Not on file  . Stress: Not on file  Relationships  . Social connections:    Talks on phone: Not on file    Gets together: Not on file    Attends religious service: Not on file    Active member of club or organization: Not on file    Attends meetings of clubs or organizations: Not on file  Relationship status: Not on file  Other Topics Concern  . Not on file  Social History Narrative  . Not on file    Allergies:  Allergies  Allergen Reactions  . Lisinopril Cough    Metabolic Disorder Labs: Lab Results  Component Value Date   HGBA1C 5.6 04/25/2016   MPG 114 04/25/2016   MPG 117 (H) 10/07/2015   No results found for: PROLACTIN Lab Results  Component Value Date   CHOL 252 (H) 01/24/2018   TRIG 111 01/24/2018   HDL 64 01/24/2018   CHOLHDL 3.9 01/24/2018   VLDL 20 10/07/2015   LDLCALC 165 (H) 01/24/2018   LDLCALC 142 (H) 08/01/2017   Lab Results  Component Value Date   TSH 2.46 08/01/2017   TSH 1.61 04/25/2016    Therapeutic Level Labs: No results found for: LITHIUM No results found for: VALPROATE No components found for:  CBMZ  Current Medications: Current Outpatient Medications  Medication Sig Dispense  Refill  . Aspirin-Acetaminophen-Caffeine (EXCEDRIN PO) Take 1 tablet by mouth as needed.    . cetirizine (ZYRTEC) 10 MG tablet Take 10 mg by mouth as needed.     . cyclobenzaprine (FLEXERIL) 10 MG tablet Take 1 tablet (10 mg total) by mouth at bedtime. 30 tablet 3  . ibuprofen (ADVIL,MOTRIN) 800 MG tablet One tablet once daily, as needed, for uncontrolled generalized joint pain 30 tablet 1  . mometasone (NASONEX) 50 MCG/ACT nasal spray Place 2 sprays into the nose daily. 17 g 12  . traZODone (DESYREL) 50 MG tablet Take 1 tablet (50 mg total) by mouth at bedtime. 90 tablet 0  . triamterene-hydrochlorothiazide (MAXZIDE-25) 37.5-25 MG tablet Take 1 tablet by mouth daily. 90 tablet 2   No current facility-administered medications for this visit.      Musculoskeletal: Strength & Muscle Tone: within normal limits Gait & Station: normal Patient leans: N/A  Psychiatric Specialty Exam: ROS  There were no vitals taken for this visit.There is no height or weight on file to calculate BMI.  General Appearance: Fairly Groomed  Eye Contact:  Good  Speech:  Clear and Coherent  Volume:  Normal  Mood:  {BHH MOOD:22306}  Affect:  {Affect (PAA):22687}  Thought Process:  Coherent  Orientation:  Full (Time, Place, and Person)  Thought Content: Logical   Suicidal Thoughts:  {ST/HT (PAA):22692}  Homicidal Thoughts:  {ST/HT (PAA):22692}  Memory:  Immediate;   Good  Judgement:  {Judgement (PAA):22694}  Insight:  {Insight (PAA):22695}  Psychomotor Activity:  Normal  Concentration:  Concentration: Good and Attention Span: Good  Recall:  Good  Fund of Knowledge: Good  Language: Good  Akathisia:  No  Handed:  Right  AIMS (if indicated): not done  Assets:  Communication Skills Desire for Improvement  ADL's:  Intact  Cognition: WNL  Sleep:  {BHH GOOD/FAIR/POOR:22877}   Screenings: PHQ2-9     Office Visit from 06/26/2018 in [facility] Office Visit from 03/11/2018 in [facility] Office Visit from 01/23/2018 in [facility] Office Visit from 05/29/2017 in [facility] Office Visit from 04/27/2016 in [facility]  PHQ-2 Total Score  5  6  6  6  6   PHQ-9 Total Score  15  17  18  21  18        Assessment and Plan:  Shirley Bush is a 62 y.o. year old female with a history of depression, hypertension , who presents for follow up appointment for No diagnosis found. Psychosocial stressors including  discordance with her son,taking care of her gland daughter at home,and unemployment.  # MDD, moderate, recurrent without psychotic features Patient continues to report neurovegetative symptoms and anxiety since the last appointment.  She could not tolerate up titration of fluoxetine due to headache.  Will try Lexapro at lower dose to target depression.  Noted that although the patient reports interest in reinitiating duloxetine, will not try at this time given hypertension.  Will continue trazodone as needed for insomnia.  Discussed behavioral activation.   Plan  1. Hold fluoxetine 2. Start lexapro 5 mg daily for one week, then 10 mg daily  3. Continue Trazodone 50 mg at night as needed for sleep 4.Keep the appointment in December  Past trials of medication:duloxetine, Sertraline (neck pain at 150 mg daily), fluoxetine (bloating, headache),   The patient demonstrates the following risk factors for suicide: Chronic risk factors for suicide include:psychiatric disorder ofdepression,, medical illnessof fibromyalgiaand chronic pain. Acute risk factorsfor suicide include: family or marital conflict and unemployment. Protective factorsfor this patient include: responsibility to others (children, family), coping skills and hope for the future. Considering these factors, the overall suicide risk at this point appears to below. Patientisappropriate for outpatient follow up.  Norman Clay, MD 07/01/2018, 12:27 PM

## 2018-07-05 ENCOUNTER — Ambulatory Visit (HOSPITAL_COMMUNITY): Payer: Self-pay | Admitting: Psychiatry

## 2018-07-05 ENCOUNTER — Ambulatory Visit (HOSPITAL_COMMUNITY): Payer: Medicare HMO | Admitting: Psychiatry

## 2018-07-22 ENCOUNTER — Ambulatory Visit (INDEPENDENT_AMBULATORY_CARE_PROVIDER_SITE_OTHER): Payer: Medicare Other

## 2018-07-22 ENCOUNTER — Telehealth: Payer: Self-pay | Admitting: Family Medicine

## 2018-07-22 VITALS — BP 155/97 | HR 104 | Resp 10 | Ht 65.0 in | Wt 160.0 lb

## 2018-07-22 DIAGNOSIS — Z0000 Encounter for general adult medical examination without abnormal findings: Secondary | ICD-10-CM | POA: Diagnosis not present

## 2018-07-22 NOTE — Progress Notes (Signed)
Subjective:   Shirley Bush is a 63 y.o. female who presents for Medicare Annual (Subsequent) preventive examination.  Review of Systems:   Cardiac Risk Factors include: obesity (BMI >30kg/m2);sedentary lifestyle;hypertension     Objective:     Vitals: BP (!) 155/97   Pulse (!) 104   Resp 10   Ht 5\' 5"  (1.651 m)   Wt 160 lb (72.6 kg)   SpO2 97%   BMI 26.63 kg/m   Body mass index is 26.63 kg/m.  Advanced Directives 07/22/2018 11/11/2014 10/30/2014 03/06/2014 04/17/2013  Does Patient Have a Medical Advance Directive? No No No No Patient does not have advance directive;Patient would like information  Would patient like information on creating a medical advance directive? Yes (ED - Information included in AVS) Yes - Educational materials given Yes - Educational materials given No - patient declined information Advance directive packet given  Some encounter information is confidential and restricted. Go to Review Flowsheets activity to see all data.    Tobacco Social History   Tobacco Use  Smoking Status Never Smoker  Smokeless Tobacco Never Used     Counseling given: Not Answered   Clinical Intake:  Pre-visit preparation completed: Yes  Pain : 0-10 Pain Score: 5  Pain Type: Chronic pain Pain Location: Neck Pain Orientation: Mid Pain Radiating Towards: shoulders  Pain Descriptors / Indicators: Sharp, Shooting, Aching, Dull Pain Onset: More than a month ago Pain Frequency: Constant Pain Relieving Factors: Advil, flexeril   Pain Relieving Factors: Advil, flexeril   BMI - recorded: 26.63 Nutritional Status: BMI 25 -29 Overweight Nutritional Risks: None Diabetes: No  How often do you need to have someone help you when you read instructions, pamphlets, or other written materials from your doctor or pharmacy?: 1 - Never What is the last grade level you completed in school?: 12 grade   Interpreter Needed?: No  Information entered by :: Francena Hanly LPN   Past  Medical History:  Diagnosis Date  . Depression 2002  . Fibromyalgia   . Hypertension 2004  . Medial meniscus tear 04/2013   left  . Migraines    Past Surgical History:  Procedure Laterality Date  . [place]  . CESAREAN SECTION  1980  . CESAREAN SECTION  1992  . COLONOSCOPY  02/05/2009  . COLONOSCOPY N/A 03/06/2014   Procedure: COLONOSCOPY;  Surgeon: Danie Binder, MD;  [facility];  Service: Endoscopy;  Laterality: N/A;  10:30 AM  . FINGER SURGERY Right    long finger  . KNEE ARTHROSCOPY WITH MEDIAL MENISECTOMY Left 04/24/2013   Procedure: LEFT KNEE ARTHROSCOPY CHONDROPLASTY WITH MEDIAL MENISECTOMY;  Surgeon: Ninetta Lights, MD;  Location: [facility];  Service: Orthopedics;  Laterality: Left;  . SHOULDER ARTHROSCOPY Left 2009  . SHOULDER ARTHROSCOPY Right   . SHOULDER ARTHROSCOPY WITH ROTATOR CUFF REPAIR Left 11/25/2004  . TOTAL KNEE ARTHROPLASTY Left 11/11/2014   Procedure: LEFT TOTAL KNEE ARTHROPLASTY;  Surgeon: Kathryne Hitch, MD;  Location: [facility];  Service: Orthopedics;  Laterality: Left;  . TUBAL LIGATION  1992  . UMBILICAL HERNIA REPAIR  2008   Family History  Problem Relation Age of Onset  . Hypertension Mother   . Hypertension Father   . Alcohol abuse Father   . Seizures Father   . Migraines Sister   . Thyroid disease Sister   . Colon cancer Sister   . Thyroid disease Sister   . Cancer Sister  breast   . Cancer Maternal Aunt        gyne cancer   Social History   Socioeconomic History  . Marital status: Divorced    Spouse name: Not on file  . Number of children: 3  . Years of education: 12 grade   . Highest education level: 12th grade  Occupational History  . Occupation: disability   Social Needs  . Financial resource strain: Not hard at all  . Food insecurity:    Worry: Never true    Inability: Never true  . Transportation needs:    Medical: No    Non-medical: No  Tobacco Use  . Smoking status: Never  Smoker  . Smokeless tobacco: Never Used  Substance and Sexual Activity  . Alcohol use: Yes    Comment: occasionally  . Drug use: No  . Sexual activity: Not Currently  Lifestyle  . Physical activity:    Days per week: 0 days    Minutes per session: 0 min  . Stress: Only a little  Relationships  . Social connections:    Talks on phone: More than three times a week    Gets together: Once a week    Attends religious service: Never    Active member of club or organization: No    Attends meetings of clubs or organizations: Never    Relationship status: Divorced  Other Topics Concern  . Not on file  Social History Narrative   Son is living with her at the moment, patient states that she is isolating in her home and is depressed more lately     Outpatient Encounter Medications as of 07/22/2018  Medication Sig  . Aspirin-Acetaminophen-Caffeine (EXCEDRIN PO) Take 1 tablet by mouth as needed.  . cetirizine (ZYRTEC) 10 MG tablet Take 10 mg by mouth as needed.   . cyclobenzaprine (FLEXERIL) 10 MG tablet Take 1 tablet (10 mg total) by mouth at bedtime.  Mar[place] Kitchen ibuprofen (ADVIL,MOTRIN) 800 MG tablet One tablet once daily, as needed, for uncontrolled generalized joint pain  . mometasone (NASONEX) 50 MCG/ACT nasal spray Place 2 sprays into the nose daily.  . traZODone (DESYREL) 50 MG tablet Take 1 tablet (50 mg total) by mouth at bedtime.  . triamterene-hydrochlorothiazide (MAXZIDE-25) 37.5-25 MG tablet Take 1 tablet by mouth daily.   No facility-administered encounter medications on file as of 07/22/2018.     Activities of Daily Living In your present state of health, do you have any difficulty performing the following activities: 07/22/2018  Hearing? N  Vision? Y  Difficulty concentrating or making decisions? N  Walking or climbing stairs? N  Dressing or bathing? N  Doing errands, shopping? N  Preparing Food and eating ? N  Using the Toilet? N  In the past six months, have you accidently  leaked urine? N  Do you have problems with loss of bowel control? N  Managing your Medications? N  Managing your Finances? N  Housekeeping or managing your Housekeeping? N  Some recent data might be hidden    Patient Care Team: Fayrene Helper, MD as PCP - General Norman Clay, MD as Consulting Physician (Psychiatry)    Assessment:   This is a routine wellness examination for Shirley Bush.  Exercise Activities and Dietary recommendations Current Exercise Habits: The patient does not participate in regular exercise at present, Exercise limited by: orthopedic condition(s);cardiac condition(s);psychological condition(s)  Goals    . Increase physical activity       Fall Risk Fall Risk  07/22/2018  06/26/2018 03/11/2018 05/29/2017 02/07/2013  Falls in the past year? 0 0 No No No  Number falls in past yr: - 0 - - -  Injury with Fall? - 0 - - -   Is the patient's home free of loose throw rugs in walkways, pet beds, electrical cords, etc?   yes      Grab bars in the bathroom? no      Handrails on the stairs?   yes      Adequate lighting?   yes  Timed Get Up and Go performed: Patient able to perform in 5 seconds without assistance   Depression Screen PHQ 2/9 Scores 07/22/2018 06/26/2018 03/11/2018 03/11/2018  PHQ - 2 Score 6 5 6 6   PHQ- 9 Score 18 15 17 18      Cognitive Function     6CIT Screen 07/22/2018  What Year? 0 points  What month? 0 points  What time? 0 points  Count back from 20 0 points  Months in reverse 0 points  Repeat phrase 4 points  Total Score 4    Immunization History  Administered Date(s) Administered  . Influenza,inj,Quad PF,6+ Mos 05/06/2014, 10/05/2015, 04/27/2016, 03/11/2018  . Td 05/17/2000  . Tdap 02/10/2011    Qualifies for Shingles Vaccine?N/A   Screening Tests Health Maintenance  Topic Date Due  . PAP SMEAR-Modifier  12/03/2018  . MAMMOGRAM  06/17/2020  . TETANUS/TDAP  02/09/2021  . COLONOSCOPY  03/06/2024  . INFLUENZA VACCINE   Completed  . Hepatitis C Screening  Completed  . HIV Screening  Completed    Cancer Screenings: Lung: Low Dose CT Chest recommended if Age 30-80 years, 30 pack-year currently smoking OR have quit w/in 15years. Patient does not qualify. Breast:  Up to date on Mammogram? Yes   Up to date of Bone Density/Dexa? No Colorectal: up to date   Additional Screenings:  Hepatitis C Screening: complete      Plan:   Continue to try and get out more and exercise   I have personally reviewed and noted the following in the patient's chart:   . Medical and social history . Use of alcohol, tobacco or illicit drugs  . Current medications and supplements . Functional ability and status . Nutritional status . Physical activity . Advanced directives . List of other physicians . Hospitalizations, surgeries, and ER visits in previous 12 months . Vitals . Screenings to include cognitive, depression, and falls . Referrals and appointments  In addition, I have reviewed and discussed with patient certain preventive protocols, quality metrics, and best practice recommendations. A written personalized care plan for preventive services as well as general preventive health recommendations were provided to patient.     Hayden Pedro, LPN  [id number]

## 2018-07-22 NOTE — Telephone Encounter (Signed)
Called and spoke with patient, she is aware and understands that she does have active orders for blood work that needs to be done before her next appointment on October 21, 2018

## 2018-07-22 NOTE — Patient Instructions (Signed)
Shirley Bush , Thank you for taking time to come for your Medicare Wellness Visit. I appreciate your ongoing commitment to your health goals. Please review the following plan we discussed and let me know if I can assist you in the future.   Screening recommendations/referrals: Colonoscopy: up to date  Mammogram: up to date  Bone Density: N/A  Recommended yearly ophthalmology/optometry visit for glaucoma screening and checkup Recommended yearly dental visit for hygiene and checkup  Vaccinations: Influenza vaccine: up to date  Pneumococcal vaccine: N/A  Tdap vaccine: up to date  Shingles vaccine: N/A     Advanced directives: information provided   Conditions/risks identified: fibromyalgia, hypertension, chronic pain   Next appointment: Wellness visit in one year   Preventive Care 40-64 Years, Female Preventive care refers to lifestyle choices and visits with your health care provider that can promote health and wellness. What does preventive care include?  A yearly physical exam. This is also called an annual well check.  Dental exams once or twice a year.  Routine eye exams. Ask your health care provider how often you should have your eyes checked.  Personal lifestyle choices, including:  Daily care of your teeth and gums.  Regular physical activity.  Eating a healthy diet.  Avoiding tobacco and drug use.  Limiting alcohol use.  Practicing safe sex.  Taking low-dose aspirin daily starting at age 79.  Taking vitamin and mineral supplements as recommended by your health care provider. What happens during an annual well check? The services and screenings done by your health care provider during your annual well check will depend on your age, overall health, lifestyle risk factors, and family history of disease. Counseling  Your health care provider may ask you questions about your:  Alcohol use.  Tobacco use.  Drug use.  Emotional well-being.  Home and  relationship well-being.  Sexual activity.  Eating habits.  Work and work Statistician.  Method of birth control.  Menstrual cycle.  Pregnancy history. Screening  You may have the following tests or measurements:  Height, weight, and BMI.  Blood pressure.  Lipid and cholesterol levels. These may be checked every 5 years, or more frequently if you are over 105 years old.  Skin check.  Lung cancer screening. You may have this screening every year starting at age 2 if you have a 30-pack-year history of smoking and currently smoke or have quit within the past 15 years.  Fecal occult blood test (FOBT) of the stool. You may have this test every year starting at age 64.  Flexible sigmoidoscopy or colonoscopy. You may have a sigmoidoscopy every 5 years or a colonoscopy every 10 years starting at age 50.  Hepatitis C blood test.  Hepatitis B blood test.  Sexually transmitted disease (STD) testing.  Diabetes screening. This is done by checking your blood sugar (glucose) after you have not eaten for a while (fasting). You may have this done every 1-3 years.  Mammogram. This may be done every 1-2 years. Talk to your health care provider about when you should start having regular mammograms. This may depend on whether you have a family history of breast cancer.  BRCA-related cancer screening. This may be done if you have a family history of breast, ovarian, tubal, or peritoneal cancers.  Pelvic exam and Pap test. This may be done every 3 years starting at age 58. Starting at age 7, this may be done every 5 years if you have a Pap test in combination with an  HPV test.  Bone density scan. This is done to screen for osteoporosis. You may have this scan if you are at high risk for osteoporosis. Discuss your test results, treatment options, and if necessary, the need for more tests with your health care provider. Vaccines  Your health care provider may recommend certain vaccines, such  as:  Influenza vaccine. This is recommended every year.  Tetanus, diphtheria, and acellular pertussis (Tdap, Td) vaccine. You may need a Td booster every 10 years.  Zoster vaccine. You may need this after age 66.  Pneumococcal 13-valent conjugate (PCV13) vaccine. You may need this if you have certain conditions and were not previously vaccinated.  Pneumococcal polysaccharide (PPSV23) vaccine. You may need one or two doses if you smoke cigarettes or if you have certain conditions. Talk to your health care provider about which screenings and vaccines you need and how often you need them. This information is not intended to replace advice given to you by your health care provider. Make sure you discuss any questions you have with your health care provider. Document Released: 07/30/2015 Document Revised: 03/22/2016 Document Reviewed: 05/04/2015 Elsevier Interactive Patient Education  2017 [place] Prevention in the Home Falls can cause injuries. They can happen to people of all ages. There are many things you can do to make your home safe and to help prevent falls. What can I do on the outside of my home?  Regularly fix the edges of walkways and driveways and fix any cracks.  Remove anything that might make you trip as you walk through a door, such as a raised step or threshold.  Trim any bushes or trees on the path to your home.  Use bright outdoor lighting.  Clear any walking paths of anything that might make someone trip, such as rocks or tools.  Regularly check to see if handrails are loose or broken. Make sure that both sides of any steps have handrails.  Any raised decks and porches should have guardrails on the edges.  Have any leaves, snow, or ice cleared regularly.  Use sand or salt on walking paths during winter.  Clean up any spills in your garage right away. This includes oil or grease spills. What can I do in the bathroom?  Use night  lights.  Install grab bars by the toilet and in the tub and shower. Do not use towel bars as grab bars.  Use non-skid mats or decals in the tub or shower.  If you need to sit down in the shower, use a plastic, non-slip stool.  Keep the floor dry. Clean up any water that spills on the floor as soon as it happens.  Remove soap buildup in the tub or shower regularly.  Attach bath mats securely with double-sided non-slip rug tape.  Do not have throw rugs and other things on the floor that can make you trip. What can I do in the bedroom?  Use night lights.  Make sure that you have a light by your bed that is easy to reach.  Do not use any sheets or blankets that are too big for your bed. They should not hang down onto the floor.  Have a firm chair that has side arms. You can use this for support while you get dressed.  Do not have throw rugs and other things on the floor that can make you trip. What can I do in the kitchen?  Clean up any spills right away.  Avoid walking on wet floors.  Keep items that you use a lot in easy-to-reach places.  If you need to reach something above you, use a strong step stool that has a grab bar.  Keep electrical cords out of the way.  Do not use floor polish or wax that makes floors slippery. If you must use wax, use non-skid floor wax.  Do not have throw rugs and other things on the floor that can make you trip. What can I do with my stairs?  Do not leave any items on the stairs.  Make sure that there are handrails on both sides of the stairs and use them. Fix handrails that are broken or loose. Make sure that handrails are as long as the stairways.  Check any carpeting to make sure that it is firmly attached to the stairs. Fix any carpet that is loose or worn.  Avoid having throw rugs at the top or bottom of the stairs. If you do have throw rugs, attach them to the floor with carpet tape.  Make sure that you have a light switch at the  top of the stairs and the bottom of the stairs. If you do not have them, ask someone to add them for you. What else can I do to help prevent falls?  Wear shoes that:  Do not have high heels.  Have rubber bottoms.  Are comfortable and fit you well.  Are closed at the toe. Do not wear sandals.  If you use a stepladder:  Make sure that it is fully opened. Do not climb a closed stepladder.  Make sure that both sides of the stepladder are locked into place.  Ask someone to hold it for you, if possible.  Clearly mark and make sure that you can see:  Any grab bars or handrails.  First and last steps.  Where the edge of each step is.  Use tools that help you move around (mobility aids) if they are needed. These include:  Canes.  Walkers.  Scooters.  Crutches.  Turn on the lights when you go into a dark area. Replace any light bulbs as soon as they burn out.  Set up your furniture so you have a clear path. Avoid moving your furniture around.  If any of your floors are uneven, fix them.  If there are any pets around you, be aware of where they are.  Review your medicines with your doctor. Some medicines can make you feel dizzy. This can increase your chance of falling. Ask your doctor what other things that you can do to help prevent falls. This information is not intended to replace advice given to you by your health care provider. Make sure you discuss any questions you have with your health care provider. Document Released: 04/29/2009 Document Revised: 12/09/2015 Document Reviewed: 08/07/2014 Elsevier Interactive Patient Education  2017 [employer].

## 2018-07-22 NOTE — Telephone Encounter (Signed)
[employer] and Ms Menees called today and she did not understand that she had a AWV today.  She had called [facility] regarding this visit.  [employer] called trying to schedule a AWV while they were both on the phone.  I explained that is what you did this am.  Can you call her and let her know when she is scheduled for any type of follow up blood work and I am mailing her another AVS from today.  We need to as her about shingles shot also.

## 2018-08-01 ENCOUNTER — Encounter: Payer: Self-pay | Admitting: *Deleted

## 2018-08-01 ENCOUNTER — Telehealth: Payer: Self-pay | Admitting: *Deleted

## 2018-08-01 ENCOUNTER — Other Ambulatory Visit: Payer: Self-pay | Admitting: *Deleted

## 2018-08-01 ENCOUNTER — Encounter: Payer: Self-pay | Admitting: Nurse Practitioner

## 2018-08-01 ENCOUNTER — Ambulatory Visit: Payer: Medicare Other | Admitting: Nurse Practitioner

## 2018-08-01 ENCOUNTER — Encounter: Payer: Self-pay | Admitting: Gastroenterology

## 2018-08-01 VITALS — BP 153/97 | HR 82 | Temp 98.3°F | Ht 63.0 in | Wt 163.0 lb

## 2018-08-01 DIAGNOSIS — Z800 Family history of malignant neoplasm of digestive organs: Secondary | ICD-10-CM

## 2018-08-01 DIAGNOSIS — K5900 Constipation, unspecified: Secondary | ICD-10-CM | POA: Diagnosis not present

## 2018-08-01 DIAGNOSIS — R1084 Generalized abdominal pain: Secondary | ICD-10-CM | POA: Diagnosis not present

## 2018-08-01 DIAGNOSIS — R109 Unspecified abdominal pain: Secondary | ICD-10-CM | POA: Insufficient documentation

## 2018-08-01 MED ORDER — NA SULFATE-K SULFATE-MG SULF 17.5-3.13-1.6 GM/177ML PO SOLN: 1.0000 | ORAL | 0 refills | Status: DC

## 2018-08-01 NOTE — Assessment & Plan Note (Signed)
Constipation significantly improved.  Not currently on any constipation medicines.  Has a bowel movement once a day which is generally Bristol 4 with no straining.  Significant improvement after discontinuing gummy chews and softgels.  Recommend she continue current medications and follow-up in 6 months.

## 2018-08-01 NOTE — Patient Instructions (Signed)
Your health issues we discussed today were:   Constipation and abdominal pain: 1. Continue taking your current medications. 2. You can continue to avoid softgels and other pill forms that cause abdominal pain. 3. You can investigate other forms of iron other than pills that cause occasional abdominal pain.  Family history of colon cancer: 1. You are due for colonoscopy this year and, per your request, we will proceed with scheduling today. 2. Further recommendations will be made after your colonoscopy.  Overall I recommend:  1. Follow-up in 6 months. 2. Call us if you have any questions or concerns.  At [facility] we value your feedback. You may receive a survey about your visit today. Please share your experience as we strive to create trusting relationships with our patients to provide genuine, compassionate, quality care.  We appreciate your understanding and patience as we review any laboratory studies, imaging, and other diagnostic tests that are ordered as we care for you. Our office policy is 5 business days for review of these results, and any emergent or urgent results are addressed in a timely manner for your best interest. If you do not hear from our office in 1 week, please contact us.   We also encourage the use of MyChart, which contains your medical information for your review as well. If you are not enrolled in this feature, an access code is on this after visit summary for your convenience. Thank you for allowing [place] to be involved in your care.  It was great to see you today!  I hope you have a great day!!

## 2018-08-01 NOTE — Progress Notes (Addendum)
REVIEWED-NO ADDITIONAL RECOMMENDATIONS.  Referring Provider: Fayrene Helper, MD Primary Care Physician:  Fayrene Helper, MD Primary GI:  Dr. Oneida Alar  Chief Complaint  Patient presents with  . Constipation    doing ok    HPI:   Shirley Bush is a 63 y.o. female who presents presents on follow-up for constipation.  The patient was last seen in our office 04/25/2018.  She complained of change in bowel habits.  Colonoscopy up-to-date 03/06/2014 which found diverticular disease, tubular adenoma polyps, recommended repeat colonoscopy in 5 years (2020).  At her last visit she noted worsening constipation with oral iron, with constipation has had worsening lower back pain.  Bowel movement every other day with hard stools but no significant straining.  Previously took stool softeners but not currently using at that time.  Drinks prune juice to help.  Recent FOBT was negative.  Due to the perceived mildness of her constipation recommended Colace once daily and MiraLAX once a day as needed for breakthrough constipation.  Recommended abdominal x-ray and follow-up in 3 months.  Abdominal 2 view was completed 04/25/2018 which found moderate to moderately large amount of feces throughout the colon but no obstruction.  Calcified uterine fibroid.  No other acute findings.  Today she states she's doing well overall. Constipation did worsen initially and though it was fibroids. She saw her GYN and he gave a course of antibiotics for 2-3 weeks and her symptoms improved. She had previously been taking supplements and has since cut those out. Constipation is overall better. Has a bowel movement daily "if I eat right." Stools consistent with Bristol 4, no straining. Abdominal pain is minimal and intermittent, does have her pain when she takes iron after blood transfusions. Denies N/V, hematochezia, melena, fever, chills, unintentional weight loss. Denies chest pain, dyspnea, dizziness, lightheadedness,  syncope, near syncope. Denies any other upper or lower GI symptoms.  She knows she is due for a colonoscopy this year and wants to set that up today.  Past Medical History:  Diagnosis Date  . Depression 2002  . Fibromyalgia   . Hypertension 2004  . Medial meniscus tear 04/2013   left  . Migraines     Past Surgical History:  Procedure Laterality Date  . [place]  . CESAREAN SECTION  1980  . CESAREAN SECTION  1992  . COLONOSCOPY  02/05/2009  . COLONOSCOPY N/A 03/06/2014   Procedure: COLONOSCOPY;  Surgeon: Danie Binder, MD;  [facility];  Service: Endoscopy;  Laterality: N/A;  10:30 AM  . FINGER SURGERY Right    long finger  . KNEE ARTHROSCOPY WITH MEDIAL MENISECTOMY Left 04/24/2013   Procedure: LEFT KNEE ARTHROSCOPY CHONDROPLASTY WITH MEDIAL MENISECTOMY;  Surgeon: Ninetta Lights, MD;  Location: [place];  Service: Orthopedics;  Laterality: Left;  . SHOULDER ARTHROSCOPY Left 2009  . SHOULDER ARTHROSCOPY Right   . SHOULDER ARTHROSCOPY WITH ROTATOR CUFF REPAIR Left 11/25/2004  . TOTAL KNEE ARTHROPLASTY Left 11/11/2014   Procedure: LEFT TOTAL KNEE ARTHROPLASTY;  Surgeon: Kathryne Hitch, MD;  Location: [place];  Service: Orthopedics;  Laterality: Left;  . TUBAL LIGATION  1992  . UMBILICAL HERNIA REPAIR  2008    Current Outpatient Medications  Medication Sig Dispense Refill  . Aspirin-Acetaminophen-Caffeine (EXCEDRIN PO) Take 1 tablet by mouth as needed.    . cetirizine (ZYRTEC) 10 MG tablet Take 10 mg by mouth as needed.     . cyclobenzaprine (FLEXERIL) 10 MG tablet Take 1  tablet (10 mg total) by mouth at bedtime. 30 tablet 3  . ibuprofen (ADVIL,MOTRIN) 800 MG tablet One tablet once daily, as needed, for uncontrolled generalized joint pain 30 tablet 1  . mometasone (NASONEX) 50 MCG/ACT nasal spray Place 2 sprays into the nose daily. (Patient taking differently: Place 2 sprays into the nose as needed. ) 17 g 12  . Multiple Vitamins-Minerals  (HAIR SKIN NAILS PO) Take by mouth. Takes 3 twice a day    . traZODone (DESYREL) 50 MG tablet Take 1 tablet (50 mg total) by mouth at bedtime. 90 tablet 0  . triamterene-hydrochlorothiazide (MAXZIDE-25) 37.5-25 MG tablet Take 1 tablet by mouth daily. 90 tablet 2   No current facility-administered medications for this visit.     Allergies as of 08/01/2018 - Review Complete 08/01/2018  Allergen Reaction Noted  . Lisinopril Cough 06/26/2018    Family History  Problem Relation Age of Onset  . Hypertension Mother   . Hypertension Father   . Alcohol abuse Father   . Seizures Father   . Migraines Sister   . Thyroid disease Sister   . Colon cancer Sister   . Thyroid disease Sister   . Cancer Sister        breast   . Cancer Maternal Aunt        gyne cancer    Social History   Socioeconomic History  . Marital status: Divorced    Spouse name: Not on file  . Number of children: 3  . Years of education: 12 grade   . Highest education level: 12th grade  Occupational History  . Occupation: disability   Social Needs  . Financial resource strain: Not hard at all  . Food insecurity:    Worry: Never true    Inability: Never true  . Transportation needs:    Medical: No    Non-medical: No  Tobacco Use  . Smoking status: Never Smoker  . Smokeless tobacco: Never Used  Substance and Sexual Activity  . Alcohol use: Yes    Comment: occasionally  . Drug use: No  . Sexual activity: Not Currently  Lifestyle  . Physical activity:    Days per week: 0 days    Minutes per session: 0 min  . Stress: Only a little  Relationships  . Social connections:    Talks on phone: More than three times a week    Gets together: Once a week    Attends religious service: Never    Active member of club or organization: No    Attends meetings of clubs or organizations: Never    Relationship status: Divorced  Other Topics Concern  . Not on file  Social History Narrative   Son is living with her at  the moment, patient states that she is isolating in her home and is depressed more lately     Review of Systems: Complete ROS negative except as per HPI.   Physical Exam: BP (!) 153/97   Pulse 82   Temp 98.3 F (36.8 C) (Oral)   Ht 5\' 3"  (1.6 m)   Wt 163 lb (73.9 kg)   BMI 28.87 kg/m  General:   Alert and oriented. Pleasant and cooperative. Well-nourished and well-developed.  Eyes:  Without icterus, sclera clear and conjunctiva pink.  Ears:  Normal auditory acuity. Cardiovascular:  S1, S2 present without murmurs appreciated. Extremities without clubbing or edema. Respiratory:  Clear to auscultation bilaterally. No wheezes, rales, or rhonchi. No distress.  Gastrointestinal:  +BS, soft,  non-tender and non-distended. No HSM noted. No guarding or rebound. No masses appreciated.  Rectal:  Deferred  Musculoskalatal:  Symmetrical without gross deformities. Skin:  Intact without significant lesions or rashes. Neurologic:  Alert and oriented x4;  grossly normal neurologically. Psych:  Alert and cooperative. Normal mood and affect. Heme/Lymph/Immune: No excessive bruising noted.    08/01/2018 9:40 AM   Disclaimer: This note was dictated with voice recognition software. Similar sounding words can inadvertently be transcribed and may not be corrected upon review.

## 2018-08-01 NOTE — Progress Notes (Signed)
cc'ed to pcp °

## 2018-08-01 NOTE — Assessment & Plan Note (Signed)
Abdominal pain mostly resolved with improvement in constipation.  She does occasionally have some intermittent, mild abdominal pain which she attributes to taking iron supplement after donating blood.  She will investigate other sources of iron such as liquid formulations.  Follow-up in 6 months.

## 2018-08-01 NOTE — Assessment & Plan Note (Signed)
Family history of colon cancer in her sister diagnosed in her late 1s or early 42s.  Last colonoscopy 2015 with tubular adenoma and recommended 5-year repeat exam.  She is currently due this year and would like to have this scheduled today.  At this point we will proceed with scheduling.  Proceed with colonoscopy on propofol/MAC with Dr. Oneida Alar in the near future. The risks, benefits, and alternatives have been discussed in detail with the patient. They state understanding and desire to proceed.   The patient is currently on Flexeril and trazodone.  No other anticoagulants, anxiolytics, chronic pain medications, or antidepressants.  We will plan for the procedure on propofol/MAC to promote adequate sedation.

## 2018-08-01 NOTE — Telephone Encounter (Signed)
Pre-op scheduled for 10/09/2018 at 12:45pm. Letter mailed. LMOVM.

## 2018-08-16 NOTE — Progress Notes (Deleted)
[facility] MD/PA/NP OP Progress Note  08/16/2018 10:08 AM TONIQUA MELAMED  MRN:  [id number]  Chief Complaint:  HPI: *** Visit Diagnosis: No diagnosis found.  Past Psychiatric History: Please see initial evaluation for full details. I have reviewed the history. No updates at this time.     Past Medical History:  Past Medical History:  Diagnosis Date  . Depression 2002  . Fibromyalgia   . Hypertension 2004  . Medial meniscus tear 04/2013   left  . Migraines     Past Surgical History:  Procedure Laterality Date  . [place]  . CESAREAN SECTION  1980  . CESAREAN SECTION  1992  . COLONOSCOPY  02/05/2009  . COLONOSCOPY N/A 03/06/2014   Procedure: COLONOSCOPY;  Surgeon: Danie Binder, MD;  [facility];  Service: Endoscopy;  Laterality: N/A;  10:30 AM  . FINGER SURGERY Right    long finger  . KNEE ARTHROSCOPY WITH MEDIAL MENISECTOMY Left 04/24/2013   Procedure: LEFT KNEE ARTHROSCOPY CHONDROPLASTY WITH MEDIAL MENISECTOMY;  Surgeon: Ninetta Lights, MD;  Location: [place];  Service: Orthopedics;  Laterality: Left;  . SHOULDER ARTHROSCOPY Left 2009  . SHOULDER ARTHROSCOPY Right   . SHOULDER ARTHROSCOPY WITH ROTATOR CUFF REPAIR Left 11/25/2004  . TOTAL KNEE ARTHROPLASTY Left 11/11/2014   Procedure: LEFT TOTAL KNEE ARTHROPLASTY;  Surgeon: Kathryne Hitch, MD;  Location: [place];  Service: Orthopedics;  Laterality: Left;  . TUBAL LIGATION  1992  . UMBILICAL HERNIA REPAIR  2008    Family Psychiatric History: Please see initial evaluation for full details. I have reviewed the history. No updates at this time.     Family History:  Family History  Problem Relation Age of Onset  . Hypertension Mother   . Hypertension Father   . Alcohol abuse Father   . Seizures Father   . Migraines Sister   . Thyroid disease Sister   . Colon cancer Sister   . Thyroid disease Sister   . Cancer Sister        breast   . Cancer Maternal Aunt        gyne cancer     Social History:  Social History   Socioeconomic History  . Marital status: Divorced    Spouse name: Not on file  . Number of children: 3  . Years of education: 12 grade   . Highest education level: 12th grade  Occupational History  . Occupation: disability   Social Needs  . Financial resource strain: Not hard at all  . Food insecurity:    Worry: Never true    Inability: Never true  . Transportation needs:    Medical: No    Non-medical: No  Tobacco Use  . Smoking status: Never Smoker  . Smokeless tobacco: Never Used  Substance and Sexual Activity  . Alcohol use: Yes    Comment: occasionally  . Drug use: No  . Sexual activity: Not Currently  Lifestyle  . Physical activity:    Days per week: 0 days    Minutes per session: 0 min  . Stress: Only a little  Relationships  . Social connections:    Talks on phone: More than three times a week    Gets together: Once a week    Attends religious service: Never    Active member of club or organization: No    Attends meetings of clubs or organizations: Never    Relationship status: Divorced  Other Topics Concern  .  Not on file  Social History Narrative   Son is living with her at the moment, patient states that she is isolating in her home and is depressed more lately     Allergies:  Allergies  Allergen Reactions  . Lisinopril Cough    Metabolic Disorder Labs: Lab Results  Component Value Date   HGBA1C 5.6 04/25/2016   MPG 114 04/25/2016   MPG 117 (H) 10/07/2015   No results found for: PROLACTIN Lab Results  Component Value Date   CHOL 252 (H) 01/24/2018   TRIG 111 01/24/2018   HDL 64 01/24/2018   CHOLHDL 3.9 01/24/2018   VLDL 20 10/07/2015   LDLCALC 165 (H) 01/24/2018   LDLCALC 142 (H) 08/01/2017   Lab Results  Component Value Date   TSH 2.46 08/01/2017   TSH 1.61 04/25/2016    Therapeutic Level Labs: No results found for: LITHIUM No results found for: VALPROATE No components found for:   CBMZ  Current Medications: Current Outpatient Medications  Medication Sig Dispense Refill  . Aspirin-Acetaminophen-Caffeine (EXCEDRIN PO) Take 1 tablet by mouth as needed.    . cetirizine (ZYRTEC) 10 MG tablet Take 10 mg by mouth as needed.     . cyclobenzaprine (FLEXERIL) 10 MG tablet Take 1 tablet (10 mg total) by mouth at bedtime. 30 tablet 3  . ibuprofen (ADVIL,MOTRIN) 800 MG tablet One tablet once daily, as needed, for uncontrolled generalized joint pain 30 tablet 1  . mometasone (NASONEX) 50 MCG/ACT nasal spray Place 2 sprays into the nose daily. (Patient taking differently: Place 2 sprays into the nose as needed. ) 17 g 12  . Multiple Vitamins-Minerals (HAIR SKIN NAILS PO) Take by mouth. Takes 3 twice a day    . Na Sulfate-K Sulfate-Mg Sulf 17.5-3.13-1.6 GM/177ML SOLN Take 1 kit by mouth as directed. 1 Bottle 0  . traZODone (DESYREL) 50 MG tablet Take 1 tablet (50 mg total) by mouth at bedtime. 90 tablet 0  . triamterene-hydrochlorothiazide (MAXZIDE-25) 37.5-25 MG tablet Take 1 tablet by mouth daily. 90 tablet 2   No current facility-administered medications for this visit.      Musculoskeletal: Strength & Muscle Tone: within normal limits Gait & Station: normal Patient leans: N/A  Psychiatric Specialty Exam: ROS  There were no vitals taken for this visit.There is no height or weight on file to calculate BMI.  General Appearance: Fairly Groomed  Eye Contact:  Good  Speech:  Clear and Coherent  Volume:  Normal  Mood:  {BHH MOOD:22306}  Affect:  {Affect (PAA):22687}  Thought Process:  Coherent  Orientation:  Full (Time, Place, and Person)  Thought Content: Logical   Suicidal Thoughts:  {ST/HT (PAA):22692}  Homicidal Thoughts:  {ST/HT (PAA):22692}  Memory:  Immediate;   Good  Judgement:  {Judgement (PAA):22694}  Insight:  {Insight (PAA):22695}  Psychomotor Activity:  Normal  Concentration:  Concentration: Good and Attention Span: Good  Recall:  Good  Fund of Knowledge:  Good  Language: Good  Akathisia:  No  Handed:  Right  AIMS (if indicated): not done  Assets:  Communication Skills Desire for Improvement  ADL's:  Intact  Cognition: WNL  Sleep:  {BHH GOOD/FAIR/POOR:22877}   Screenings: PHQ2-9     Clinical Support from 07/22/2018 in [facility] Office Visit from 06/26/2018 in [facility] Office Visit from 03/11/2018 in [facility] Office Visit from 01/23/2018 in [facility] Office Visit from 05/29/2017 in [facility]  PHQ-2 Total Score  6  5  6  6  6  PHQ-9 Total Score  '18  15  17  18  21       '$ Assessment and Plan:  HANIN DECOOK is a 63 y.o. year old female with a history of depression, hypertension, who presents for follow up appointment for No diagnosis found. Psychosocial stressors including discordance with her son,taking care of her gland daughter at home,and unemployment.  # MDD, moderate, recurrent without psychotic features Patient continues to report neurovegetative symptoms and anxiety since the last appointment.  She could not tolerate up titration of fluoxetine due to headache.  Will try Lexapro at lower dose to target depression.  Noted that although the patient reports interest in reinitiating duloxetine, will not try at this time given hypertension.  Will continue trazodone as needed for insomnia.  Discussed behavioral activation.   Plan  1. Hold fluoxetine 2. Start lexapro 5 mg daily for one week, then 10 mg daily  3. Continue Trazodone 50 mg at night as needed for sleep 4.Keep the appointment in December  Past trials of medication:duloxetine, Sertraline (neck pain at 150 mg daily), fluoxetine (bloating, headache),   The patient demonstrates the following risk factors for suicide: Chronic risk factors for suicide include:psychiatric disorder ofdepression,, medical illnessof fibromyalgiaand chronic pain. Acute risk factorsfor suicide include: family or  marital conflict and unemployment. Protective factorsfor this patient include: responsibility to others (children, family), coping skills and hope for the future. Considering these factors, the overall suicide risk at this point appears to below. Patientisappropriate for outpatient follow up.  Norman Clay, MD 08/16/2018, 10:08 AM

## 2018-08-20 ENCOUNTER — Ambulatory Visit (HOSPITAL_COMMUNITY): Payer: Self-pay | Admitting: Psychiatry

## 2018-08-26 NOTE — Progress Notes (Deleted)
[facility] MD/PA/NP OP Progress Note  08/26/2018 12:15 PM Shirley Bush  MRN:  [id number]  Chief Complaint:  HPI: *** Visit Diagnosis: No diagnosis found.  Past Psychiatric History: Please see initial evaluation for full details. I have reviewed the history. No updates at this time.     Past Medical History:  Past Medical History:  Diagnosis Date  . Depression 2002  . Fibromyalgia   . Hypertension 2004  . Medial meniscus tear 04/2013   left  . Migraines     Past Surgical History:  Procedure Laterality Date  . [place]  . CESAREAN SECTION  1980  . CESAREAN SECTION  1992  . COLONOSCOPY  02/05/2009  . COLONOSCOPY N/A 03/06/2014   Procedure: COLONOSCOPY;  Surgeon: Danie Binder, MD;  [facility];  Service: Endoscopy;  Laterality: N/A;  10:30 AM  . FINGER SURGERY Right    long finger  . KNEE ARTHROSCOPY WITH MEDIAL MENISECTOMY Left 04/24/2013   Procedure: LEFT KNEE ARTHROSCOPY CHONDROPLASTY WITH MEDIAL MENISECTOMY;  Surgeon: Ninetta Lights, MD;  Location: [place];  Service: Orthopedics;  Laterality: Left;  . SHOULDER ARTHROSCOPY Left 2009  . SHOULDER ARTHROSCOPY Right   . SHOULDER ARTHROSCOPY WITH ROTATOR CUFF REPAIR Left 11/25/2004  . TOTAL KNEE ARTHROPLASTY Left 11/11/2014   Procedure: LEFT TOTAL KNEE ARTHROPLASTY;  Surgeon: Kathryne Hitch, MD;  Location: [place];  Service: Orthopedics;  Laterality: Left;  . TUBAL LIGATION  1992  . UMBILICAL HERNIA REPAIR  2008    Family Psychiatric History: Please see initial evaluation for full details. I have reviewed the history. No updates at this time.     Family History:  Family History  Problem Relation Age of Onset  . Hypertension Mother   . Hypertension Father   . Alcohol abuse Father   . Seizures Father   . Migraines Sister   . Thyroid disease Sister   . Colon cancer Sister   . Thyroid disease Sister   . Cancer Sister        breast   . Cancer Maternal Aunt        gyne cancer     Social History:  Social History   Socioeconomic History  . Marital status: Divorced    Spouse name: Not on file  . Number of children: 3  . Years of education: 12 grade   . Highest education level: 12th grade  Occupational History  . Occupation: disability   Social Needs  . Financial resource strain: Not hard at all  . Food insecurity:    Worry: Never true    Inability: Never true  . Transportation needs:    Medical: No    Non-medical: No  Tobacco Use  . Smoking status: Never Smoker  . Smokeless tobacco: Never Used  Substance and Sexual Activity  . Alcohol use: Yes    Comment: occasionally  . Drug use: No  . Sexual activity: Not Currently  Lifestyle  . Physical activity:    Days per week: 0 days    Minutes per session: 0 min  . Stress: Only a little  Relationships  . Social connections:    Talks on phone: More than three times a week    Gets together: Once a week    Attends religious service: Never    Active member of club or organization: No    Attends meetings of clubs or organizations: Never    Relationship status: Divorced  Other Topics Concern  .  Not on file  Social History Narrative   Son is living with her at the moment, patient states that she is isolating in her home and is depressed more lately     Allergies:  Allergies  Allergen Reactions  . Lisinopril Cough    Metabolic Disorder Labs: Lab Results  Component Value Date   HGBA1C 5.6 04/25/2016   MPG 114 04/25/2016   MPG 117 (H) 10/07/2015   No results found for: PROLACTIN Lab Results  Component Value Date   CHOL 252 (H) 01/24/2018   TRIG 111 01/24/2018   HDL 64 01/24/2018   CHOLHDL 3.9 01/24/2018   VLDL 20 10/07/2015   LDLCALC 165 (H) 01/24/2018   LDLCALC 142 (H) 08/01/2017   Lab Results  Component Value Date   TSH 2.46 08/01/2017   TSH 1.61 04/25/2016    Therapeutic Level Labs: No results found for: LITHIUM No results found for: VALPROATE No components found for:   CBMZ  Current Medications: Current Outpatient Medications  Medication Sig Dispense Refill  . Aspirin-Acetaminophen-Caffeine (EXCEDRIN PO) Take 1 tablet by mouth as needed.    . cetirizine (ZYRTEC) 10 MG tablet Take 10 mg by mouth as needed.     . cyclobenzaprine (FLEXERIL) 10 MG tablet Take 1 tablet (10 mg total) by mouth at bedtime. 30 tablet 3  . ibuprofen (ADVIL,MOTRIN) 800 MG tablet One tablet once daily, as needed, for uncontrolled generalized joint pain 30 tablet 1  . mometasone (NASONEX) 50 MCG/ACT nasal spray Place 2 sprays into the nose daily. (Patient taking differently: Place 2 sprays into the nose as needed. ) 17 g 12  . Multiple Vitamins-Minerals (HAIR SKIN NAILS PO) Take by mouth. Takes 3 twice a day    . Na Sulfate-K Sulfate-Mg Sulf 17.5-3.13-1.6 GM/177ML SOLN Take 1 kit by mouth as directed. 1 Bottle 0  . traZODone (DESYREL) 50 MG tablet Take 1 tablet (50 mg total) by mouth at bedtime. 90 tablet 0  . triamterene-hydrochlorothiazide (MAXZIDE-25) 37.5-25 MG tablet Take 1 tablet by mouth daily. 90 tablet 2   No current facility-administered medications for this visit.      Musculoskeletal: Strength & Muscle Tone: within normal limits Gait & Station: normal Patient leans: N/A  Psychiatric Specialty Exam: ROS  There were no vitals taken for this visit.There is no height or weight on file to calculate BMI.  General Appearance: Fairly Groomed  Eye Contact:  Good  Speech:  Clear and Coherent  Volume:  Normal  Mood:  {BHH MOOD:22306}  Affect:  {Affect (PAA):22687}  Thought Process:  Coherent  Orientation:  Full (Time, Place, and Person)  Thought Content: Logical   Suicidal Thoughts:  {ST/HT (PAA):22692}  Homicidal Thoughts:  {ST/HT (PAA):22692}  Memory:  Immediate;   Good  Judgement:  {Judgement (PAA):22694}  Insight:  {Insight (PAA):22695}  Psychomotor Activity:  Normal  Concentration:  Concentration: Good and Attention Span: Good  Recall:  Good  Fund of Knowledge:  Good  Language: Good  Akathisia:  No  Handed:  Right  AIMS (if indicated): not done  Assets:  Communication Skills Desire for Improvement  ADL's:  Intact  Cognition: WNL  Sleep:  {BHH GOOD/FAIR/POOR:22877}   Screenings: PHQ2-9     Clinical Support from 07/22/2018 in [facility] Office Visit from 06/26/2018 in [facility] Office Visit from 03/11/2018 in [facility] Office Visit from 01/23/2018 in [facility] Office Visit from 05/29/2017 in [facility]  PHQ-2 Total Score  6  5  6  6  6  PHQ-9 Total Score  _0 Assessment and Plan:  SHAQUNA GEIGLE is a 63 y.o. year old female with a history of depression, hypertension, who presents for follow up appointment for No diagnosis found.  Psychosocial stressors including discordance with her son,taking care of her gland daughter at home,and unemployment.  # MDD, moderate, recurrent without psychotic features Patient continues to report neurovegetative symptoms and anxiety since the last appointment.  She could not tolerate up titration of fluoxetine due to headache.  Will try Lexapro at lower dose to target depression.  Noted that although the patient reports interest in reinitiating duloxetine, will not try at this time given hypertension.  Will continue trazodone as needed for insomnia.  Discussed behavioral activation.   Plan  1. Hold fluoxetine 2. Start lexapro 5 mg daily for one week, then 10 mg daily  3. Continue Trazodone 50 mg at night as needed for sleep 4.Keep the appointment in December  Past trials of medication:duloxetine, Sertraline (neck pain at 150 mg daily), fluoxetine (bloating, headache),   The patient demonstrates the following risk factors for suicide: Chronic risk factors for suicide include:psychiatric disorder ofdepression,, medical illnessof fibromyalgiaand chronic pain. Acute risk factorsfor suicide include: family or  marital conflict and unemployment. Protective factorsfor this patient include: responsibility to others (children, family), coping skills and hope for the future. Considering these factors, the overall suicide risk at this point appears to below. Patientisappropriate for outpatient follow up.  Norman Clay, MD 08/26/2018, 12:15 PM

## 2018-09-02 ENCOUNTER — Ambulatory Visit (HOSPITAL_COMMUNITY): Payer: Self-pay | Admitting: Psychiatry

## 2018-09-03 ENCOUNTER — Encounter: Payer: Self-pay | Admitting: Family Medicine

## 2018-09-03 ENCOUNTER — Ambulatory Visit (INDEPENDENT_AMBULATORY_CARE_PROVIDER_SITE_OTHER): Payer: Medicare Other | Admitting: Family Medicine

## 2018-09-03 VITALS — BP 148/96 | HR 100 | Resp 12 | Ht 63.0 in | Wt 166.0 lb

## 2018-09-03 DIAGNOSIS — H6591 Unspecified nonsuppurative otitis media, right ear: Secondary | ICD-10-CM

## 2018-09-03 DIAGNOSIS — J309 Allergic rhinitis, unspecified: Secondary | ICD-10-CM | POA: Diagnosis not present

## 2018-09-03 DIAGNOSIS — I10 Essential (primary) hypertension: Secondary | ICD-10-CM

## 2018-09-03 MED ORDER — PREDNISONE 5 MG (21) PO TBPK: 5.0000 mg | ORAL_TABLET | ORAL | 0 refills | Status: DC

## 2018-09-03 MED ORDER — AZELASTINE HCL 0.1 % NA SOLN: 2.0000 | Freq: Two times a day (BID) | NASAL | 12 refills | Status: DC

## 2018-09-03 MED ORDER — METHYLPREDNISOLONE ACETATE 80 MG/ML IJ SUSP
80.0000 mg | Freq: Once | INTRAMUSCULAR | Status: AC
  Administered 2018-09-03: 80 mg via INTRAMUSCULAR

## 2018-09-03 MED ORDER — TRIAMTERENE-HCTZ 37.5-25 MG PO TABS: ORAL_TABLET | ORAL | 3 refills | Status: DC

## 2018-09-03 MED ORDER — AZITHROMYCIN 250 MG PO TABS: ORAL_TABLET | ORAL | 0 refills | Status: DC

## 2018-09-03 MED ORDER — CHLORPHENIRAMINE MALEATE 4 MG PO TABS: ORAL_TABLET | ORAL | 0 refills | Status: DC

## 2018-09-03 NOTE — Patient Instructions (Signed)
F/u in April as before, call if yoyu ned me sooner  INCREASER triamterene to one and a half tablets once daily  Depo Medrol 80 mg given for allergies  Prednisone, chlorpheniramine , Azithromycin and Astelin are prescribed  Treated for uncontrolled allergies and right ear infection  Thank you  for choosing [facility]. We consider it a privelige to serve you.  Delivering excellent health care in a caring and  compassionate way is our goal.  Partnering with you,  so that together we can achieve this goal is our strategy.

## 2018-09-03 NOTE — Assessment & Plan Note (Addendum)
Uncontrolled, depo Medrol 80 mg iM and prednisone dose pack Start daily astelin

## 2018-09-09 ENCOUNTER — Encounter: Payer: Self-pay | Admitting: Family Medicine

## 2018-09-09 ENCOUNTER — Other Ambulatory Visit: Payer: Self-pay | Admitting: Family Medicine

## 2018-09-09 ENCOUNTER — Telehealth: Payer: Self-pay | Admitting: Family Medicine

## 2018-09-09 DIAGNOSIS — H6591 Unspecified nonsuppurative otitis media, right ear: Secondary | ICD-10-CM | POA: Insufficient documentation

## 2018-09-09 MED ORDER — FLUCONAZOLE 150 MG PO TABS: ORAL_TABLET | ORAL | 0 refills | Status: DC

## 2018-09-09 NOTE — Assessment & Plan Note (Addendum)
Z pack prescribed, also chlorpheniramine

## 2018-09-09 NOTE — Telephone Encounter (Signed)
Pt aware med has been prescribed

## 2018-09-09 NOTE — Telephone Encounter (Signed)
Pt has a yeast infection from the Antibiotics, can you call her something in

## 2018-09-09 NOTE — Assessment & Plan Note (Signed)
Uncontrolled , increase triamterene to 1.5 tabs daily DASH diet and commitment to daily physical activity for a minimum of 30 minutes discussed and encouraged, as a part of hypertension management. The importance of attaining a healthy weight is also discussed.  BP/Weight 09/03/2018 08/01/2018 07/22/2018 06/26/2018 05/22/2018 04/25/2018 3/77/9396  Systolic BP 886 484 720 721 [phone]  Diastolic BP 96 97 97 90 93 78 93  Wt. (Lbs) 166 163 160 160.08 161 157.4 157  BMI 29.41 28.87 26.63 28.36 28.52 27.88 27.81  Some encounter information is confidential and restricted. Go to Review Flowsheets activity to see all data.

## 2018-09-09 NOTE — Progress Notes (Signed)
   Shirley Bush     MRN: [id number]      DOB: 10/05/1955   HPI Ms. Hightower is here wihth a 3 week h/o nasal congestion,head feels as though it is in a barrel. C/o ear pain, excess watery eyes , sneezing and cough, Denies fever, has ahd occasional chills ROS  Denies chest pains, palpitations and leg swelling Denies abdominal pain, nausea, vomiting,diarrhea or constipation.   Denies dysuria, frequency, hesitancy or incontinence. Denies joint pain, swelling and limitation in mobility. Denies headaches, seizures, numbness, or tingling. C/o  depression, anxiety  And  Insomnia.Treatment is through Psychiatry, not suicidal or homicidal Denies skin break down or rash.   PE  BP (!) 148/96   Pulse 100   Resp 12   Ht 5\' 3"  (1.6 m)   Wt 166 lb (75.3 kg)   SpO2 99% Comment: room air  BMI 29.41 kg/m   Patient alert and oriented and in no cardiopulmonary distress.  HEENT: No facial asymmetry, EOMI,   oropharynx pink and moist.  Neck supple no JVD, no mass.Exceess watery conjunctiva bilaterally, nasal mucosa edematous and erythematous, Right tM dull and mildly erythematous with poor light reflex  Chest: Clear to auscultation bilaterally.  CVS: S1, S2 no murmurs, no S3.Regular rate.  ABD: Soft non tender.   Ext: No edema  MS: Adequate ROM spine, shoulders, hips and knees.  Skin: Intact, no ulcerations or rash noted.  Psych: Good eye contact, normal affect. Memory intact not anxious or depressed appearing.  CNS: CN 2-12 intact, power,  normal throughout.no focal deficits noted.   Assessment & Plan  Allergic rhinitis Uncontrolled, depo Medrol 80 mg iM and prednisone dose pack Start daily astelin  OME (otitis media with effusion), right Z pack prescribed, also chlorpheniramine  Essential hypertension Uncontrolled , increase triamterene to 1.5 tabs daily DASH diet and commitment to daily physical activity for a minimum of 30 minutes discussed and encouraged, as a part of  hypertension management. The importance of attaining a healthy weight is also discussed.  BP/Weight 09/03/2018 08/01/2018 07/22/2018 06/26/2018 05/22/2018 04/25/2018 3/73/6681  Systolic BP 594 707 615 [phone]  Diastolic BP 96 97 97 90 93 78 93  Wt. (Lbs) 166 163 160 160.08 161 157.4 157  BMI 29.41 28.87 26.63 28.36 28.52 27.88 27.81  Some encounter information is confidential and restricted. Go to Review Flowsheets activity to see all data.

## 2018-09-30 ENCOUNTER — Telehealth: Payer: Self-pay | Admitting: Gastroenterology

## 2018-09-30 NOTE — Telephone Encounter (Signed)
Called pt, she doesn't want to reschedule procedure at this time.

## 2018-09-30 NOTE — Telephone Encounter (Signed)
[facility] RESCHEDULING HER PROCEDURE

## 2018-10-07 ENCOUNTER — Telehealth: Payer: Self-pay | Admitting: *Deleted

## 2018-10-07 NOTE — Telephone Encounter (Signed)
Called patient and she has r/s'd procedure to 01/14/2019 at 10:30am. Patient aware will mail new instructions and pre-op in the mail.

## 2018-10-07 NOTE — Telephone Encounter (Signed)
Pre-op scheduled for 6/25 at 10:00am. Letter mailed

## 2018-10-09 ENCOUNTER — Encounter (HOSPITAL_COMMUNITY): Admission: RE | Admit: 2018-10-09 | Payer: Medicare Other | Source: Ambulatory Visit

## 2018-10-21 ENCOUNTER — Other Ambulatory Visit: Payer: Self-pay

## 2018-10-21 ENCOUNTER — Ambulatory Visit: Payer: Medicare Other | Admitting: Family Medicine

## 2018-10-22 ENCOUNTER — Other Ambulatory Visit: Payer: Self-pay

## 2018-10-22 ENCOUNTER — Ambulatory Visit (INDEPENDENT_AMBULATORY_CARE_PROVIDER_SITE_OTHER): Payer: Medicare Other | Admitting: Family Medicine

## 2018-10-22 ENCOUNTER — Encounter: Payer: Self-pay | Admitting: Family Medicine

## 2018-10-22 VITALS — BP 158/90 | Ht 63.0 in | Wt 160.0 lb

## 2018-10-22 DIAGNOSIS — Z13220 Encounter for screening for lipoid disorders: Secondary | ICD-10-CM

## 2018-10-22 DIAGNOSIS — R252 Cramp and spasm: Secondary | ICD-10-CM

## 2018-10-22 DIAGNOSIS — I10 Essential (primary) hypertension: Secondary | ICD-10-CM | POA: Diagnosis not present

## 2018-10-22 DIAGNOSIS — F331 Major depressive disorder, recurrent, moderate: Secondary | ICD-10-CM

## 2018-10-22 DIAGNOSIS — J309 Allergic rhinitis, unspecified: Secondary | ICD-10-CM

## 2018-10-22 MED ORDER — PREDNISONE 5 MG PO TABS: 5.0000 mg | ORAL_TABLET | Freq: Two times a day (BID) | ORAL | 0 refills | Status: AC

## 2018-10-22 MED ORDER — MAGNESIUM OXIDE 400 (241.3 MG) MG PO TABS: ORAL_TABLET | ORAL | 5 refills | Status: DC

## 2018-10-22 MED ORDER — FLUTICASONE PROPIONATE 50 MCG/ACT NA SUSP: 2.0000 | Freq: Every day | NASAL | 4 refills | Status: DC

## 2018-10-22 MED ORDER — TRIAMTERENE-HCTZ 37.5-25 MG PO TABS: ORAL_TABLET | ORAL | 5 refills | Status: DC

## 2018-10-22 NOTE — Patient Instructions (Addendum)
Physical with pap 03/13/19 or shortly after, call if you need me before  Increase triamterene to one tablet two times daily, start magnesium 400 mg one daily for cramps, and use mustard also  Additional medication for daily use for allergies is Flonase spray, continue Astelin as before, and short course of prednisone is prescribed , use if you have a flare of your allergies, and they are uncontrolled   Please get fasting lipid, cmp and EGFr Magnesium level and TSH 1 week before August appointment  It is important that you exercise regularly at least 30 minutes 5 times a week. If you develop chest pain, have severe difficulty breathing, or feel very tired, stop exercising immediately and seek medical attention  Think about what you will eat, plan ahead. Choose " clean, green, fresh or frozen" over canned, processed or packaged foods which are more sugary, salty and fatty. 70 to 75% of food eaten should be vegetables and fruit. Three meals at set times with snacks allowed between meals, but they must be fruit or vegetables. Aim to eat over a 12 hour period , example 7 am to 7 pm, and STOP after  your last meal of the day. Drink water,generally about 64 ounces per day, no other drink is as healthy. Fruit juice is best enjoyed in a healthy way, by EATING the fruit.   Social distancing. Frequent hand washing with soap and water Keeping your hands off of your face. These 3 practices will help to keep both you and your community healthy during this time. Please practice them faithfully!

## 2018-10-22 NOTE — Progress Notes (Signed)
Virtual Visit via Telephone Note  I connected with Shirley Bush on 10/22/18 at 10:40 AM EDT by telephone and verified that I am speaking with the correct person using two identifiers.   I discussed the limitations, risks, security and privacy concerns of performing an evaluation and management service by telephone and the availability of in person appointments. I also discussed with the patient that there may be a patient responsible charge related to this service. The patient expressed understanding and agreed to proceed.   History of Present Illness: Recurrent ear pressure , sore around throat, cough at times cloudy sptum , no fever or chills F/u chronic problems and medication and lab review Denies recent fever or chills. Denies ear pain or sore throat. Denies chest congestion, productive cough or wheezing. Denies chest pains, palpitations and leg swelling Denies abdominal pain, nausea, vomiting,diarrhea or constipation.   Denies dysuria, frequency, hesitancy or incontinence. Denies uncontrolled  joint pain, swelling and limitation in mobility. Denies headaches, seizures, numbness, or tingling. Chronic  Depression and  anxiety increased with Covid 19, but Ms Rightmyer is not concerned about this and has repeatedly decided against Psych treatment. Denies skin break down or rash.       Observations/Objective: BP (!) 158/90   Ht 5\' 3"  (1.6 m)   Wt 160 lb (72.6 kg)   BMI 28.34 kg/m    Assessment and Plan: Essential hypertension Uncontrolled, increase triamterene dose DASH diet and commitment to daily physical activity for a minimum of 30 minutes discussed and encouraged, as a part of hypertension management. The importance of attaining a healthy weight is also discussed.  BP/Weight 10/22/2018 09/03/2018 08/01/2018 07/22/2018 06/26/2018 05/22/2018 64/15/8309  Systolic BP 407 680 881 103 159 458 592  Diastolic BP 90 96 97 97 90 93 78  Wt. (Lbs) 160 166 163 160 160.08 161 157.4  BMI  28.34 29.41 28.87 26.63 28.36 28.52 27.88  Some encounter information is confidential and restricted. Go to Review Flowsheets activity to see all data.       Allergic rhinitis Increased and uncontrolled symptoms with advent of Spring, needs to commit to daily prescription medication  MDD (major depressive disorder), recurrent episode, moderate ([place]) Has not been compliant with either medication or management through Psychiatry, no medication is precribed and no referral made, she is neither suicidal or homicidal    Follow Up Instructions:    I discussed the assessment and treatment plan with the patient. The patient was provided an opportunity to ask questions and all were answered. The patient agreed with the plan and demonstrated an understanding of the instructions.   The patient was advised to call back or seek an in-person evaluation if the symptoms worsen or if the condition fails to improve as anticipated.  I provided 22 minutes of non-face-to-face time during this encounter.   Tula Nakayama, MD

## 2018-10-24 ENCOUNTER — Encounter: Payer: Self-pay | Admitting: Family Medicine

## 2018-10-24 NOTE — Assessment & Plan Note (Signed)
Has not been compliant with either medication or management through Psychiatry, no medication is precribed and no referral made, she is neither suicidal or homicidal

## 2018-10-24 NOTE — Assessment & Plan Note (Signed)
Uncontrolled, increase triamterene dose DASH diet and commitment to daily physical activity for a minimum of 30 minutes discussed and encouraged, as a part of hypertension management. The importance of attaining a healthy weight is also discussed.  BP/Weight 10/22/2018 09/03/2018 08/01/2018 07/22/2018 06/26/2018 05/22/2018 45/99/7741  Systolic BP 423 953 202 334 356 861 683  Diastolic BP 90 96 97 97 90 93 78  Wt. (Lbs) 160 166 163 160 160.08 161 157.4  BMI 28.34 29.41 28.87 26.63 28.36 28.52 27.88  Some encounter information is confidential and restricted. Go to Review Flowsheets activity to see all data.

## 2018-10-24 NOTE — Assessment & Plan Note (Signed)
Increased and uncontrolled symptoms with advent of Spring, needs to commit to daily prescription medication

## 2019-01-07 ENCOUNTER — Other Ambulatory Visit: Payer: Self-pay

## 2019-01-07 ENCOUNTER — Encounter (HOSPITAL_COMMUNITY): Payer: Self-pay

## 2019-01-08 ENCOUNTER — Telehealth: Payer: Self-pay | Admitting: Gastroenterology

## 2019-01-08 NOTE — Telephone Encounter (Signed)
Patient called back. Made aware everyone is testing 3 days prior to procedure. Nothing further needed

## 2019-01-08 NOTE — Telephone Encounter (Signed)
LMOVM

## 2019-01-08 NOTE — Telephone Encounter (Signed)
Pt is scheduled with [facility] for colonoscopy on 6/30. She is concerned about not getting the covid results back in time for the procedure. The covid test is 6/26. She also said that her son lives with her and his employer has had multiple positive cases. She said her son was tested also, but hasn't gotten his results back. Please advise. [phone]

## 2019-01-09 ENCOUNTER — Encounter (HOSPITAL_COMMUNITY)
Admission: RE | Admit: 2019-01-09 | Discharge: 2019-01-09 | Disposition: A | Discharge status: Home / Self Care | Payer: Medicare Other | Source: Ambulatory Visit | Attending: Gastroenterology | Admitting: Gastroenterology

## 2019-01-10 ENCOUNTER — Other Ambulatory Visit (HOSPITAL_COMMUNITY)
Admission: RE | Admit: 2019-01-10 | Discharge: 2019-01-10 | Disposition: A | Discharge status: Home / Self Care | Payer: Medicare Other | Source: Ambulatory Visit | Attending: Gastroenterology | Admitting: Gastroenterology

## 2019-01-10 ENCOUNTER — Other Ambulatory Visit: Payer: Self-pay

## 2019-01-10 DIAGNOSIS — Z1159 Encounter for screening for other viral diseases: Secondary | ICD-10-CM | POA: Insufficient documentation

## 2019-01-11 LAB — NOVEL CORONAVIRUS, NAA (HOSP ORDER, SEND-OUT TO REF LAB; TAT 18-24 HRS): SARS-CoV-2, NAA: NOT DETECTED

## 2019-01-14 ENCOUNTER — Encounter (HOSPITAL_COMMUNITY)
Admission: RE | Disposition: A | Discharge status: Home / Self Care | Payer: Self-pay | Source: Home / Self Care | Attending: Gastroenterology

## 2019-01-14 ENCOUNTER — Encounter (HOSPITAL_COMMUNITY): Payer: Self-pay | Admitting: *Deleted

## 2019-01-14 ENCOUNTER — Ambulatory Visit (HOSPITAL_COMMUNITY): Payer: Medicare Other | Admitting: Anesthesiology

## 2019-01-14 ENCOUNTER — Ambulatory Visit (HOSPITAL_COMMUNITY)
Admission: RE | Admit: 2019-01-14 | Discharge: 2019-01-14 | Disposition: A | Discharge status: Home / Self Care | Payer: Medicare Other | Attending: Gastroenterology | Admitting: Gastroenterology

## 2019-01-14 ENCOUNTER — Other Ambulatory Visit: Payer: Self-pay

## 2019-01-14 DIAGNOSIS — I10 Essential (primary) hypertension: Secondary | ICD-10-CM | POA: Insufficient documentation

## 2019-01-14 DIAGNOSIS — Z96652 Presence of left artificial knee joint: Secondary | ICD-10-CM | POA: Diagnosis not present

## 2019-01-14 DIAGNOSIS — Z79899 Other long term (current) drug therapy: Secondary | ICD-10-CM | POA: Insufficient documentation

## 2019-01-14 DIAGNOSIS — Z888 Allergy status to other drugs, medicaments and biological substances status: Secondary | ICD-10-CM | POA: Diagnosis not present

## 2019-01-14 DIAGNOSIS — Z8349 Family history of other endocrine, nutritional and metabolic diseases: Secondary | ICD-10-CM | POA: Insufficient documentation

## 2019-01-14 DIAGNOSIS — Z8049 Family history of malignant neoplasm of other genital organs: Secondary | ICD-10-CM | POA: Insufficient documentation

## 2019-01-14 DIAGNOSIS — K648 Other hemorrhoids: Secondary | ICD-10-CM | POA: Diagnosis not present

## 2019-01-14 DIAGNOSIS — Z8249 Family history of ischemic heart disease and other diseases of the circulatory system: Secondary | ICD-10-CM | POA: Insufficient documentation

## 2019-01-14 DIAGNOSIS — Z800 Family history of malignant neoplasm of digestive organs: Secondary | ICD-10-CM

## 2019-01-14 DIAGNOSIS — Z7982 Long term (current) use of aspirin: Secondary | ICD-10-CM | POA: Diagnosis not present

## 2019-01-14 DIAGNOSIS — Z811 Family history of alcohol abuse and dependence: Secondary | ICD-10-CM | POA: Insufficient documentation

## 2019-01-14 DIAGNOSIS — K635 Polyp of colon: Secondary | ICD-10-CM | POA: Diagnosis not present

## 2019-01-14 DIAGNOSIS — Z1211 Encounter for screening for malignant neoplasm of colon: Secondary | ICD-10-CM | POA: Diagnosis not present

## 2019-01-14 DIAGNOSIS — K5730 Diverticulosis of large intestine without perforation or abscess without bleeding: Secondary | ICD-10-CM | POA: Diagnosis not present

## 2019-01-14 DIAGNOSIS — Z803 Family history of malignant neoplasm of breast: Secondary | ICD-10-CM | POA: Diagnosis not present

## 2019-01-14 DIAGNOSIS — K644 Residual hemorrhoidal skin tags: Secondary | ICD-10-CM | POA: Diagnosis not present

## 2019-01-14 DIAGNOSIS — G709 Myoneural disorder, unspecified: Secondary | ICD-10-CM | POA: Insufficient documentation

## 2019-01-14 DIAGNOSIS — R51 Headache: Secondary | ICD-10-CM | POA: Diagnosis not present

## 2019-01-14 DIAGNOSIS — Q438 Other specified congenital malformations of intestine: Secondary | ICD-10-CM | POA: Diagnosis not present

## 2019-01-14 DIAGNOSIS — Z820 Family history of epilepsy and other diseases of the nervous system: Secondary | ICD-10-CM | POA: Insufficient documentation

## 2019-01-14 DIAGNOSIS — D122 Benign neoplasm of ascending colon: Secondary | ICD-10-CM | POA: Insufficient documentation

## 2019-01-14 DIAGNOSIS — M1990 Unspecified osteoarthritis, unspecified site: Secondary | ICD-10-CM | POA: Insufficient documentation

## 2019-01-14 DIAGNOSIS — F329 Major depressive disorder, single episode, unspecified: Secondary | ICD-10-CM | POA: Insufficient documentation

## 2019-01-14 DIAGNOSIS — Z86010 Personal history of colonic polyps: Secondary | ICD-10-CM | POA: Insufficient documentation

## 2019-01-14 DIAGNOSIS — M797 Fibromyalgia: Secondary | ICD-10-CM | POA: Diagnosis not present

## 2019-01-14 HISTORY — PX: COLONOSCOPY WITH PROPOFOL: SHX5780

## 2019-01-14 SURGERY — COLONOSCOPY WITH PROPOFOL
Anesthesia: General

## 2019-01-14 MED ORDER — CHLORHEXIDINE GLUCONATE CLOTH 2 % EX PADS: 6.0000 | MEDICATED_PAD | Freq: Once | CUTANEOUS | Status: DC

## 2019-01-14 MED ORDER — HYDROCODONE-ACETAMINOPHEN 7.5-325 MG PO TABS: 1.0000 | ORAL_TABLET | Freq: Once | ORAL | Status: DC | PRN

## 2019-01-14 MED ORDER — PROPOFOL 500 MG/50ML IV EMUL
INTRAVENOUS | Status: DC | PRN
  Administered 2019-01-14: 100 ug/kg/min via INTRAVENOUS

## 2019-01-14 MED ORDER — ONDANSETRON HCL 4 MG/2ML IJ SOLN
INTRAMUSCULAR | Status: AC
  Filled 2019-01-14: qty 2

## 2019-01-14 MED ORDER — PROMETHAZINE HCL 25 MG/ML IJ SOLN: 6.2500 mg | INTRAMUSCULAR | Status: DC | PRN

## 2019-01-14 MED ORDER — LACTATED RINGERS IV SOLN
INTRAVENOUS | Status: DC
  Administered 2019-01-14: 1000 mL via INTRAVENOUS

## 2019-01-14 MED ORDER — HYDROMORPHONE HCL 1 MG/ML IJ SOLN: 0.2500 mg | INTRAMUSCULAR | Status: DC | PRN

## 2019-01-14 MED ORDER — MIDAZOLAM HCL 2 MG/2ML IJ SOLN: 0.5000 mg | Freq: Once | INTRAMUSCULAR | Status: DC | PRN

## 2019-01-14 MED ORDER — PROPOFOL 10 MG/ML IV BOLUS
INTRAVENOUS | Status: DC | PRN
  Administered 2019-01-14 (×2): 50 mg via INTRAVENOUS

## 2019-01-14 MED ORDER — ONDANSETRON HCL 4 MG/2ML IJ SOLN
INTRAMUSCULAR | Status: DC | PRN
  Administered 2019-01-14: 4 mg via INTRAVENOUS

## 2019-01-14 MED ORDER — PROPOFOL 10 MG/ML IV BOLUS
INTRAVENOUS | Status: AC
  Filled 2019-01-14: qty 40

## 2019-01-14 NOTE — Anesthesia Preprocedure Evaluation (Addendum)
Anesthesia Evaluation  Patient identified by MRN, date of birth, ID band Patient awake    Reviewed: Allergy & Precautions, NPO status , Patient's Chart, lab work & pertinent test results  Airway Mallampati: I  TM Distance: >3 FB Neck ROM: Full    Dental no notable dental hx. (+) Teeth Intact   Pulmonary neg pulmonary ROS,    Pulmonary exam normal breath sounds clear to auscultation       Cardiovascular Exercise Tolerance: Good hypertension, Pt. on medications negative cardio ROS Normal cardiovascular examI Rhythm:Regular Rate:Normal     Neuro/Psych  Headaches, Depression  Neuromuscular disease negative psych ROS   GI/Hepatic negative GI ROS, Neg liver ROS,   Endo/Other  negative endocrine ROS  Renal/GU negative Renal ROS  negative genitourinary   Musculoskeletal  (+) Arthritis , Osteoarthritis,  Fibromyalgia -  Abdominal   Peds negative pediatric ROS (+)  Hematology negative hematology ROS (+)   Anesthesia Other Findings   Reproductive/Obstetrics negative OB ROS                             Anesthesia Physical Anesthesia Plan  ASA: II  Anesthesia Plan: General   Post-op Pain Management:    Induction: Intravenous  PONV Risk Score and Plan: 3 and Ondansetron, Propofol infusion and Treatment may vary due to age or medical condition  Airway Management Planned: Nasal Cannula and Simple Face Mask  Additional Equipment:   Intra-op Plan:   Post-operative Plan:   Informed Consent: I have reviewed the patients History and Physical, chart, labs and discussed the procedure including the risks, benefits and alternatives for the proposed anesthesia with the patient or authorized representative who has indicated his/her understanding and acceptance.     Dental advisory given  Plan Discussed with: CRNA  Anesthesia Plan Comments: (Plan Full PPE use  Plan GA with GETA as needed -d/w Pt  -WTP waith same after Q&A)       Anesthesia Quick Evaluation

## 2019-01-14 NOTE — Discharge Instructions (Signed)
You have small internal hemorrhoids and diverticulosis IN YOUR LEFT AND RIGHT COLON. YOU HAD ONE SMALL POLYP REMOVED.    DRINK WATER TO KEEP YOUR URINE LIGHT YELLOW.  FOLLOW A HIGH FIBER DIET. AVOID ITEMS THAT CAUSE BLOATING. See info below.   USE PREPARATION H FOUR TIMES  A DAY IF NEEDED TO RELIEVE RECTAL PAIN/PRESSURE/BLEEDING.   YOUR BIOPSY RESULTS WILL BE BACK IN 5 BUSINESS DAYS.  Next colonoscopy in 5 years.  Colonoscopy Care After Read the instructions outlined below and refer to this sheet in the next week. These discharge instructions provide you with general information on caring for yourself after you leave the hospital. While your treatment has been planned according to the most current medical practices available, unavoidable complications occasionally occur. If you have any problems or questions after discharge, call DR. Carlitos Bottino, [phone].  ACTIVITY  You may resume your regular activity, but move at a slower pace for the next 24 hours.   Take frequent rest periods for the next 24 hours.   Walking will help get rid of the air and reduce the bloated feeling in your belly (abdomen).   No driving for 24 hours (because of the medicine (anesthesia) used during the test).   You may shower.   Do not sign any important legal documents or operate any machinery for 24 hours (because of the anesthesia used during the test).    NUTRITION  Drink plenty of fluids.   You may resume your normal diet as instructed by your doctor.   Begin with a light meal and progress to your normal diet. Heavy or fried foods are harder to digest and may make you feel sick to your stomach (nauseated).   Avoid alcoholic beverages for 24 hours or as instructed.    MEDICATIONS  You may resume your normal medications.   WHAT YOU CAN EXPECT TODAY  Some feelings of bloating in the abdomen.   Passage of more gas than usual.   Spotting of blood in your stool or on the toilet paper  .    IF YOU HAD POLYPS REMOVED DURING THE COLONOSCOPY:  Eat a soft diet IF YOU HAVE NAUSEA, BLOATING, ABDOMINAL PAIN, OR VOMITING.    FINDING OUT THE RESULTS OF YOUR TEST Not all test results are available during your visit. DR. Oneida Alar WILL CALL YOU WITHIN 14 DAYS OF YOUR PROCEDUE WITH YOUR RESULTS. Do not assume everything is normal if you have not heard from DR. Daysy Santini, CALL HER OFFICE AT [phone].  SEEK IMMEDIATE MEDICAL ATTENTION AND CALL THE OFFICE: [phone] IF:  You have more than a spotting of blood in your stool.   Your belly is swollen (abdominal distention).   You are nauseated or vomiting.   You have a temperature over 101F.   You have abdominal pain or discomfort that is severe or gets worse throughout the day.  High-Fiber Diet A high-fiber diet changes your normal diet to include more whole grains, legumes, fruits, and vegetables. Changes in the diet involve replacing refined carbohydrates with unrefined foods. The calorie level of the diet is essentially unchanged. The Dietary Reference Intake (recommended amount) for adult males is 38 grams per day. For adult females, it is 25 grams per day. Pregnant and lactating women should consume 28 grams of fiber per day. Fiber is the intact part of a plant that is not broken down during digestion. Functional fiber is fiber that has been isolated from the plant to provide a beneficial effect in the body.  PURPOSE  Increase stool bulk.   Ease and regulate bowel movements.   Lower cholesterol.   REDUCE RISK OF COLON CANCER  INDICATIONS THAT YOU NEED MORE FIBER  Constipation and hemorrhoids.   Uncomplicated diverticulosis (intestine condition) and irritable bowel syndrome.   Weight management.   As a protective measure against hardening of the arteries (atherosclerosis), diabetes, and cancer.   GUIDELINES FOR INCREASING FIBER IN THE DIET  Start adding fiber to the diet slowly. A gradual increase of about 5 more  grams (2 slices of whole-wheat bread, 2 servings of most fruits or vegetables, or 1 bowl of high-fiber cereal) per day is best. Too rapid an increase in fiber may result in constipation, flatulence, and bloating.   Drink enough water and fluids to keep your urine clear or pale yellow. Water, juice, or caffeine-free drinks are recommended. Not drinking enough fluid may cause constipation.   Eat a variety of high-fiber foods rather than one type of fiber.   Try to increase your intake of fiber through using high-fiber foods rather than fiber pills or supplements that contain small amounts of fiber.   The goal is to change the types of food eaten. Do not supplement your present diet with high-fiber foods, but replace foods in your present diet.   INCLUDE A VARIETY OF FIBER SOURCES  Replace refined and processed grains with whole grains, canned fruits with fresh fruits, and incorporate other fiber sources. White rice, white breads, and most bakery goods contain little or no fiber.   Brown whole-grain rice, buckwheat oats, and many fruits and vegetables are all good sources of fiber. These include: broccoli, Brussels sprouts, cabbage, cauliflower, beets, sweet potatoes, white potatoes (skin on), carrots, tomatoes, eggplant, squash, berries, fresh fruits, and dried fruits.   Cereals appear to be the richest source of fiber. Cereal fiber is found in whole grains and bran. Bran is the fiber-rich outer coat of cereal grain, which is largely removed in refining. In whole-grain cereals, the bran remains. In breakfast cereals, the largest amount of fiber is found in those with "bran" in their names. The fiber content is sometimes indicated on the label.   You may need to include additional fruits and vegetables each day.   In baking, for 1 cup white flour, you may use the following substitutions:   1 cup whole-wheat flour minus 2 tablespoons.   1/2 cup white flour plus 1/2 cup whole-wheat flour.    Polyps, Colon  A polyp is extra tissue that grows inside your body. Colon polyps grow in the large intestine. The large intestine, also called the colon, is part of your digestive system. It is a long, hollow tube at the end of your digestive tract where your body makes and stores stool. Most polyps are not dangerous. They are benign. This means they are not cancerous. But over time, some types of polyps can turn into cancer. Polyps that are smaller than a pea are usually not harmful. But larger polyps could someday become or may already be cancerous. To be safe, doctors remove all polyps and test them.   PREVENTION There is not one sure way to prevent polyps. You might be able to lower your risk of getting them if you:  Eat more fruits and vegetables and less fatty food.   Do not smoke.   Avoid alcohol.   Exercise every day.   Lose weight if you are overweight.   Eating more calcium and folate can also lower your risk of  getting polyps. Some foods that are rich in calcium are milk, cheese, and broccoli. Some foods that are rich in folate are chickpeas, kidney beans, and spinach.    Diverticulosis Diverticulosis is a common condition that develops when small pouches (diverticula) form in the wall of the colon. The risk of diverticulosis increases with age. It happens more often in people who eat a low-fiber diet. Most individuals with diverticulosis have no symptoms. Those individuals with symptoms usually experience belly (abdominal) pain, constipation, or loose stools (diarrhea).  HOME CARE INSTRUCTIONS  Increase the amount of fiber in your diet as directed by your caregiver or dietician. This may reduce symptoms of diverticulosis.   Drink at least 6 to 8 glasses of water each day to prevent constipation.   Try not to strain when you have a bowel movement.   Avoiding nuts and seeds to prevent complications is NOT NECESSARY.   FOODS HAVING HIGH FIBER CONTENT INCLUDE:  Fruits.  Apple, peach, pear, tangerine, raisins, prunes.   Vegetables. Brussels sprouts, asparagus, broccoli, cabbage, carrot, cauliflower, romaine lettuce, spinach, summer squash, tomato, winter squash, zucchini.   Starchy Vegetables. Baked beans, kidney beans, lima beans, split peas, lentils, potatoes (with skin).   Grains. Whole wheat bread, brown rice, bran flake cereal, plain oatmeal, white rice, shredded wheat, bran muffins.   SEEK IMMEDIATE MEDICAL CARE IF:  You develop increasing pain or severe bloating.   You have an oral temperature above 101F.   You develop vomiting or bowel movements that are bloody or black.

## 2019-01-14 NOTE — H&P (Signed)
Primary Care Physician:  Fayrene Helper, MD Primary Gastroenterologist:  Dr. Oneida Alar  Pre-Procedure History & Physical: HPI:  Shirley Bush is a 63 y.o. female here for PERSONAL HISTORY OF POLYPS.  Past Medical History:  Diagnosis Date  . Depression 2002  . Fibromyalgia   . Hypertension 2004  . Medial meniscus tear 04/2013   left  . Migraines     Past Surgical History:  Procedure Laterality Date  . [place]  . CESAREAN SECTION  1980  . CESAREAN SECTION  1992  . COLONOSCOPY  02/05/2009  . COLONOSCOPY N/A 03/06/2014   Procedure: COLONOSCOPY;  Surgeon: Danie Binder, MD;  [facility];  Service: Endoscopy;  Laterality: N/A;  10:30 AM  . FINGER SURGERY Right    long finger  . KNEE ARTHROSCOPY WITH MEDIAL MENISECTOMY Left 04/24/2013   Procedure: LEFT KNEE ARTHROSCOPY CHONDROPLASTY WITH MEDIAL MENISECTOMY;  Surgeon: Ninetta Lights, MD;  Location: [place];  Service: Orthopedics;  Laterality: Left;  . SHOULDER ARTHROSCOPY Left 2009  . SHOULDER ARTHROSCOPY Right   . SHOULDER ARTHROSCOPY WITH ROTATOR CUFF REPAIR Left 11/25/2004  . TOTAL KNEE ARTHROPLASTY Left 11/11/2014   Procedure: LEFT TOTAL KNEE ARTHROPLASTY;  Surgeon: Kathryne Hitch, MD;  Location: [place];  Service: Orthopedics;  Laterality: Left;  . TUBAL LIGATION  1992  . UMBILICAL HERNIA REPAIR  2008    Prior to Admission medications   Medication Sig Start Date End Date Taking? Authorizing Provider  aspirin-acetaminophen-caffeine (EXCEDRIN MIGRAINE) [phone] MG tablet Take by mouth every 6 (six) hours as needed for headache.   Yes [provider]  chlorpheniramine (CHLOR-TRIMETON) 4 MG tablet Take 4 mg by mouth at bedtime as needed for allergies.   Yes [provider]  magnesium oxide (MAG-OX) 400 (241.3 Mg) MG tablet Take one tablet once daily by mouth ,  for cramps Patient taking differently: Take 400 mg by mouth daily.  10/22/18  Yes Fayrene Helper, MD   Multiple Vitamins-Minerals (HAIR SKIN AND NAILS FORMULA) TABS Take 3 tablets by mouth daily.   Yes [provider]  triamterene-hydrochlorothiazide (MAXZIDE-25) 37.5-25 MG tablet Take one tablet by mouth, 2 times daily, 12 hours apart, for blood pressure Patient taking differently: Take 1 tablet by mouth daily.  10/22/18  Yes Fayrene Helper, MD  azelastine (ASTELIN) 0.1 % nasal spray Place 2 sprays into both nostrils 2 (two) times daily. Use in each nostril as directed Patient not taking: Reported on 01/08/2019 09/03/18   Fayrene Helper, MD  fluticasone [facility]) 50 MCG/ACT nasal spray Place 2 sprays into both nostrils daily for 30 days. 10/22/18 11/21/18  Fayrene Helper, MD    Allergies as of 08/01/2018 - Review Complete 08/01/2018  Allergen Reaction Noted  . Lisinopril Cough 06/26/2018    Family History  Problem Relation Age of Onset  . Hypertension Mother   . Hypertension Father   . Alcohol abuse Father   . Seizures Father   . Migraines Sister   . Thyroid disease Sister   . Colon cancer Sister   . Thyroid disease Sister   . Cancer Sister        breast   . Cancer Maternal Aunt        gyne cancer    Social History   Socioeconomic History  . Marital status: Divorced    Spouse name: Not on file  . Number of children: 3  . Years of education: 12 grade   .  Highest education level: 12th grade  Occupational History  . Occupation: disability   Social Needs  . Financial resource strain: Not hard at all  . Food insecurity    Worry: Never true    Inability: Never true  . Transportation needs    Medical: No    Non-medical: No  Tobacco Use  . Smoking status: Never Smoker  . Smokeless tobacco: Never Used  Substance and Sexual Activity  . Alcohol use: Yes    Comment: occasionally  . Drug use: No  . Sexual activity: Not Currently  Lifestyle  . Physical activity    Days per week: 0 days    Minutes per session: 0 min  . Stress: Only a little   Relationships  . Social connections    Talks on phone: More than three times a week    Gets together: Once a week    Attends religious service: Never    Active member of club or organization: No    Attends meetings of clubs or organizations: Never    Relationship status: Divorced  . Intimate partner violence    Fear of current or ex partner: No    Emotionally abused: No    Physically abused: No    Forced sexual activity: No  Other Topics Concern  . Not on file  Social History Narrative   Son is living with her at the moment, patient states that she is isolating in her home and is depressed more lately     Review of Systems: See HPI, otherwise negative ROS   Physical Exam: There were no vitals taken for this visit. General:   Alert,  pleasant and cooperative in NAD Head:  Normocephalic and atraumatic. Neck:  Supple; Lungs:  Clear throughout to auscultation.    Heart:  Regular rate and rhythm. Abdomen:  Soft, nontender and nondistended. Normal bowel sounds, without guarding, and without rebound.   Neurologic:  Alert and  oriented x4;  grossly normal neurologically.  Impression/Plan:     PERSONAL HISTORY OF POLYPS.  PLAN: 1. TCS TODAY. DISCUSSED PROCEDURE, BENEFITS, & RISKS: < 1% chance of medication reaction, bleeding, perforation, ASPIRATION, or rupture of spleen/liver requiring surgery to fix it and missed polyps < 1 cm 10-20% of the time.

## 2019-01-14 NOTE — Op Note (Signed)
[facility] Patient Name: Shirley Bush Procedure Date: 01/14/2019 9:35 AM MRN: [id number] Date of Birth: [birth year] Attending MD: Barney Drain MD, MD CSN: [id number] Age: 63 Admit Type: Outpatient Procedure:                Colonoscopy WITH COLD SNARE POLYPECTOMY Indications:              Personal history of colonic polyps Providers:                Barney Drain MD, MD, Nelda Severe, RN, Randa Spike, Technician Referring MD:             Norwood Levo. Simpson MD, MD Medicines:                Propofol per Anesthesia Complications:            No immediate complications. Estimated Blood Loss:     Estimated blood loss was minimal. Procedure:                Pre-Anesthesia Assessment:                           - Prior to the procedure, a History and Physical                            was performed, and patient medications and                            allergies were reviewed. The patient's tolerance of                            previous anesthesia was also reviewed. The risks                            and benefits of the procedure and the sedation                            options and risks were discussed with the patient.                            All questions were answered, and informed consent                            was obtained. Prior Anticoagulants: The patient has                            taken no previous anticoagulant or antiplatelet                            agents except for aspirin. ASA Grade Assessment: II                            - A patient with mild systemic disease. After  reviewing the risks and benefits, the patient was                            deemed in satisfactory condition to undergo the                            procedure. After obtaining informed consent, the                            colonoscope was passed under direct vision.                            Throughout the procedure, the  patient's blood                            pressure, pulse, and oxygen saturations were                            monitored continuously. The PCF-[id number] ([id number])                            was introduced through the anus and advanced to the                            the cecum, identified by appendiceal orifice and                            ileocecal valve. The colonoscopy was somewhat                            difficult due to a tortuous colon. Successful                            completion of the procedure was aided by                            straightening and shortening the scope to obtain                            bowel loop reduction and COLOWRAP. The patient                            tolerated the procedure well. The quality of the                            bowel preparation was excellent. The ileocecal                            valve, appendiceal orifice, and rectum were                            photographed. Scope In: 9:55:53 AM Scope Out: 10:13:30 AM Scope Withdrawal Time: 0 hours 13 minutes 32 seconds  Total Procedure Duration: 0 hours 17 minutes  37 seconds  Findings:      A 3 mm polyp was found in the mid ascending colon. The polyp was       sessile. The polyp was removed with a cold snare. Resection and       retrieval were complete.      Multiple small and large-mouthed diverticula were found in the entire       colon.      External and internal hemorrhoids were found.      The recto-sigmoid colon, sigmoid colon and descending colon were       moderately tortuous. Impression:               - One 3 mm polyp in the mid ascending colon,                            removed with a cold snare. Resected and retrieved.                           - MODERATE Diverticulosis in the entire examined                            colon.                           - External and internal hemorrhoids.                           - Tortuous LEFT colon. Moderate Sedation:      Per  Anesthesia Care Recommendation:           - Patient has a contact number available for                            emergencies. The signs and symptoms of potential                            delayed complications were discussed with the                            patient. Return to normal activities tomorrow.                            Written discharge instructions were provided to the                            patient.                           - High fiber diet.                           - Continue present medications.                           - Await pathology results.                           - Repeat colonoscopy in 5 years  for surveillance. Procedure Code(s):        --- Professional ---                           [phone], Colonoscopy, flexible; with removal of                            tumor(s), polyp(s), or other lesion(s) by snare                            technique Diagnosis Code(s):        --- Professional ---                           K63.5, Polyp of colon                           K64.8, Other hemorrhoids                           Z86.010, Personal history of colonic polyps                           K57.30, Diverticulosis of large intestine without                            perforation or abscess without bleeding                           Q43.8, Other specified congenital malformations of                            intestine CPT copyright 2019 American Medical Association. All rights reserved. The codes documented in this report are preliminary and upon coder review may  be revised to meet current compliance requirements. Barney Drain, MD Barney Drain MD, MD 01/14/2019 10:28:46 AM This report has been signed electronically. Number of Addenda: 0

## 2019-01-14 NOTE — Anesthesia Postprocedure Evaluation (Addendum)
Anesthesia Post Note  Patient: Shirley Bush  Procedure(s) Performed: COLONOSCOPY WITH PROPOFOL (N/A )  Patient location during evaluation: PACU Anesthesia Type: General Level of consciousness: awake and alert and oriented Pain management: pain level controlled Vital Signs Assessment: post-procedure vital signs reviewed and stable Respiratory status: spontaneous breathing, nonlabored ventilation and respiratory function stable Cardiovascular status: stable Postop Assessment: no apparent nausea or vomiting Anesthetic complications: no     Last Vitals:  Vitals:   01/14/19 0856  BP: (!) 141/98  Pulse: 93  Resp: 17  Temp: 36.9 C  SpO2: 95%    Last Pain:  Vitals:   01/14/19 0856  TempSrc: Oral  PainSc: 0-No pain                 GREGORY,SUZANNE

## 2019-01-14 NOTE — Transfer of Care (Addendum)
Immediate Anesthesia Transfer of Care Note  Patient: Shirley Bush  Procedure(s) Performed: COLONOSCOPY WITH PROPOFOL (N/A )  Patient Location: PACU  Anesthesia Type:General  Level of Consciousness: awake, alert  and oriented  Airway & Oxygen Therapy: Patient Spontanous Breathing  Post-op Assessment: Report given to RN and Post -op Vital signs reviewed and stable  Post vital signs: Reviewed and stable  Last Vitals:  Vitals Value Taken Time  BP    Temp    Pulse 136 01/14/19 1021  Resp    SpO2 81 % 01/14/19 1021  Vitals shown include unvalidated device data.  Last Pain:  Vitals:   01/14/19 0856  TempSrc: Oral  PainSc: 0-No pain         Complications: No apparent anesthesia complications

## 2019-01-16 NOTE — Addendum Note (Signed)
Addendum  created 01/16/19 0815 by Ollen Bowl, CRNA   Clinical Note Signed, Intraprocedure Flowsheets edited

## 2019-01-16 NOTE — Progress Notes (Signed)
PT is aware.

## 2019-01-20 ENCOUNTER — Encounter (HOSPITAL_COMMUNITY): Payer: Self-pay | Admitting: Gastroenterology

## 2019-01-21 IMAGING — MG DIGITAL SCREENING BILATERAL MAMMOGRAM WITH TOMO AND CAD
8 series · 8 of 24 positions shown · non-contrast
Comparison: Previous exam(s).

CLINICAL DATA: Screening.

EXAM:
DIGITAL SCREENING BILATERAL MAMMOGRAM WITH TOMO AND CAD

[R MLO synth-2D]
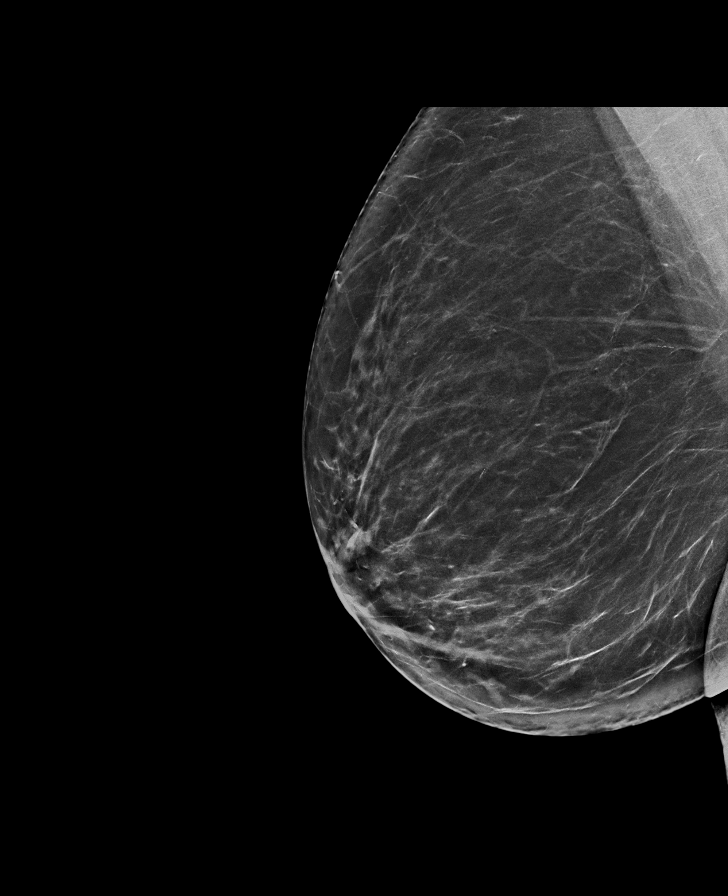

[L MLO synth-2D]
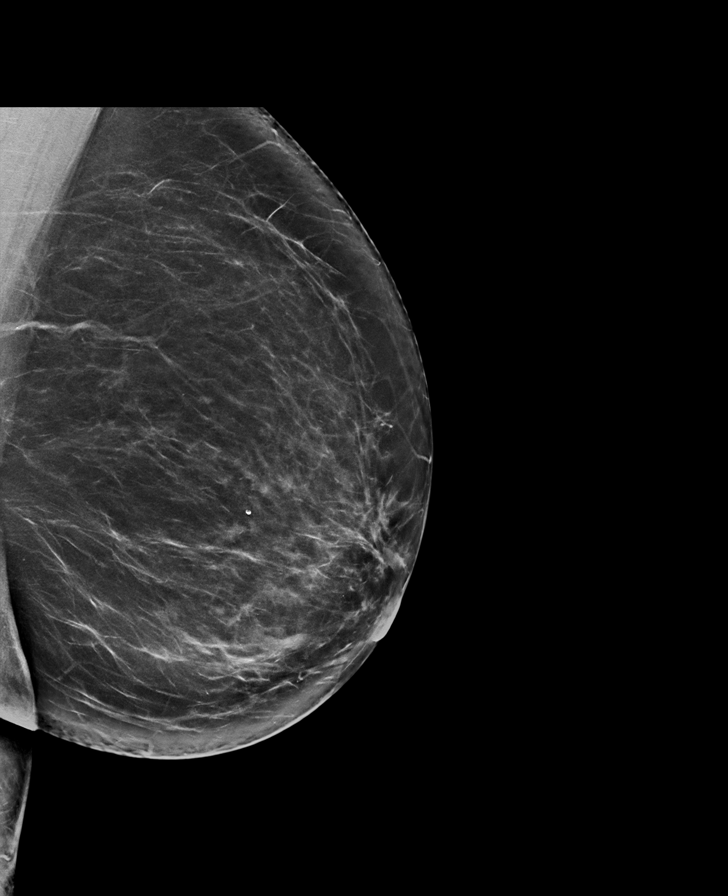

[R CC synth-2D]
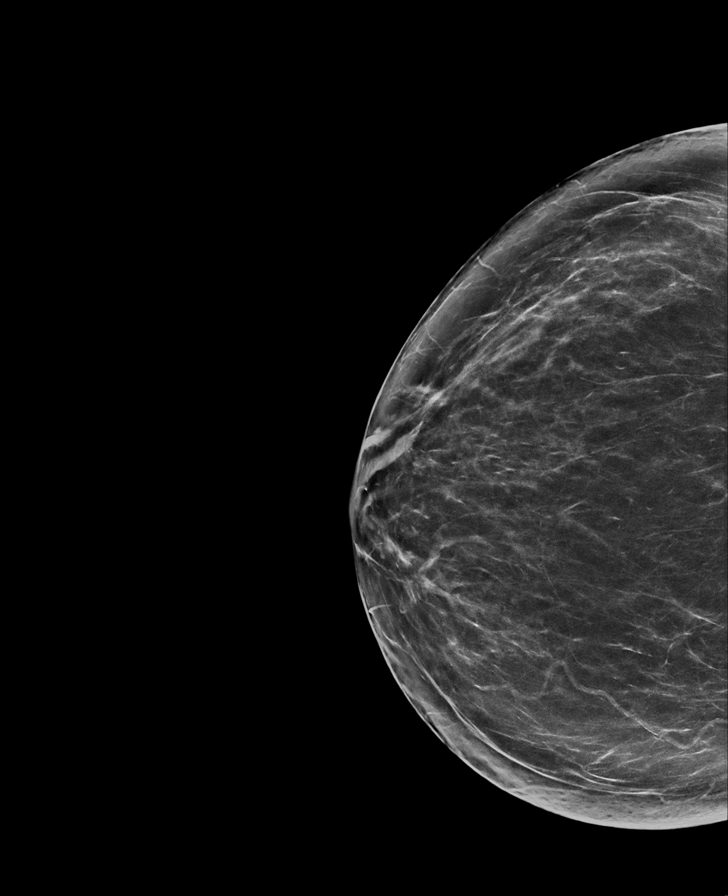

[L CC synth-2D]
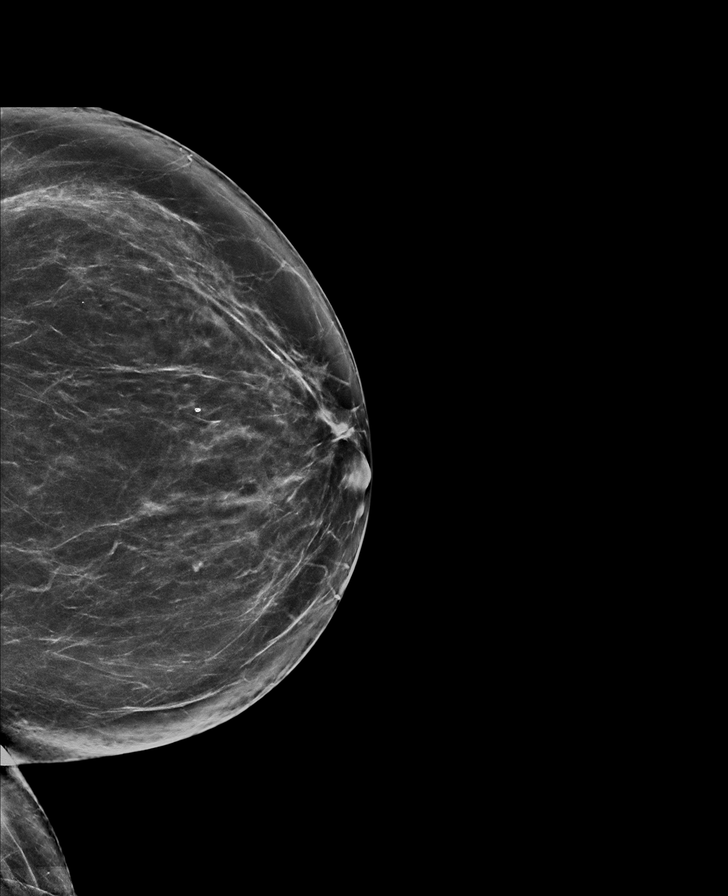

[L CC tomo · tomo slice 41/80.0]
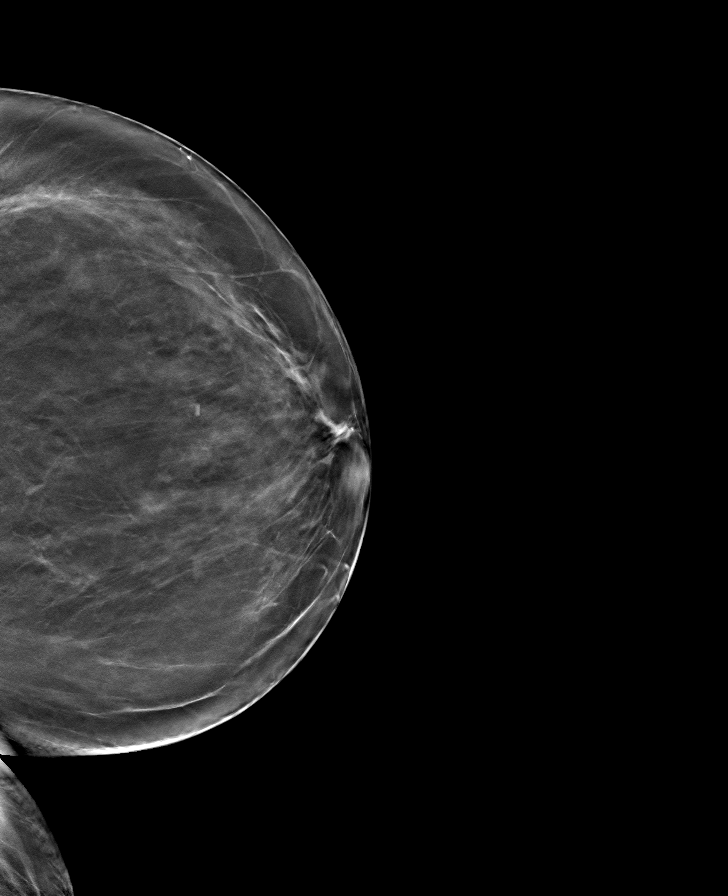

[R CC tomo · tomo slice 39/78.0]
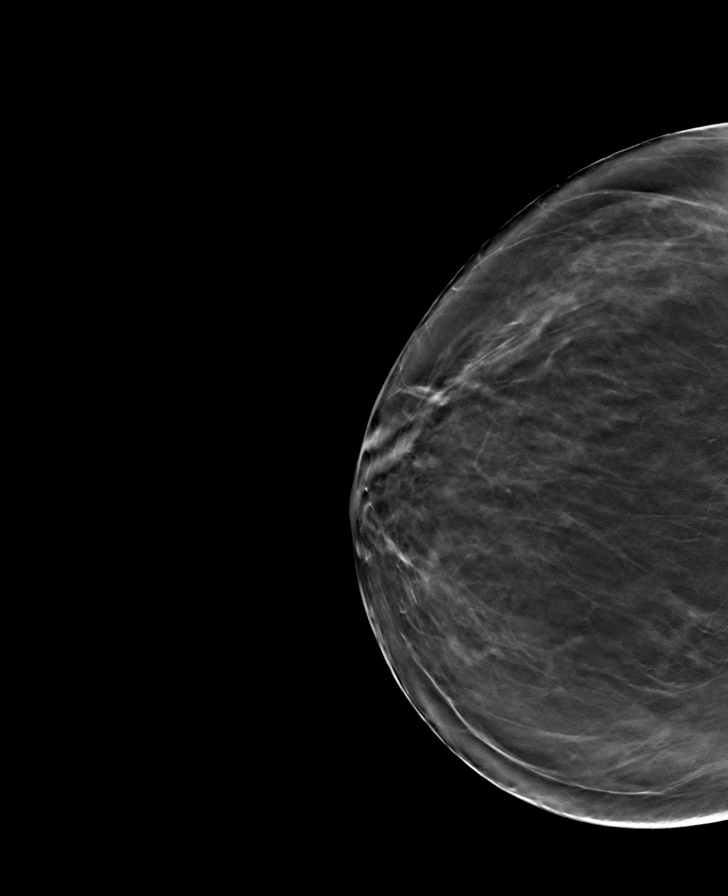

[R MLO tomo · tomo slice 43/84.0]
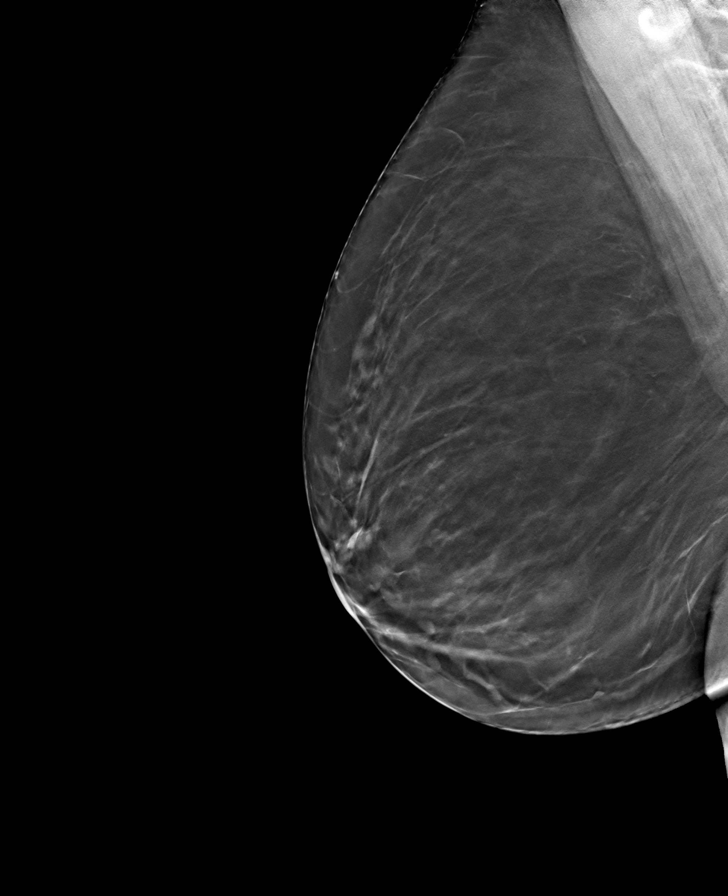

[L MLO tomo · tomo slice 42/83.0]
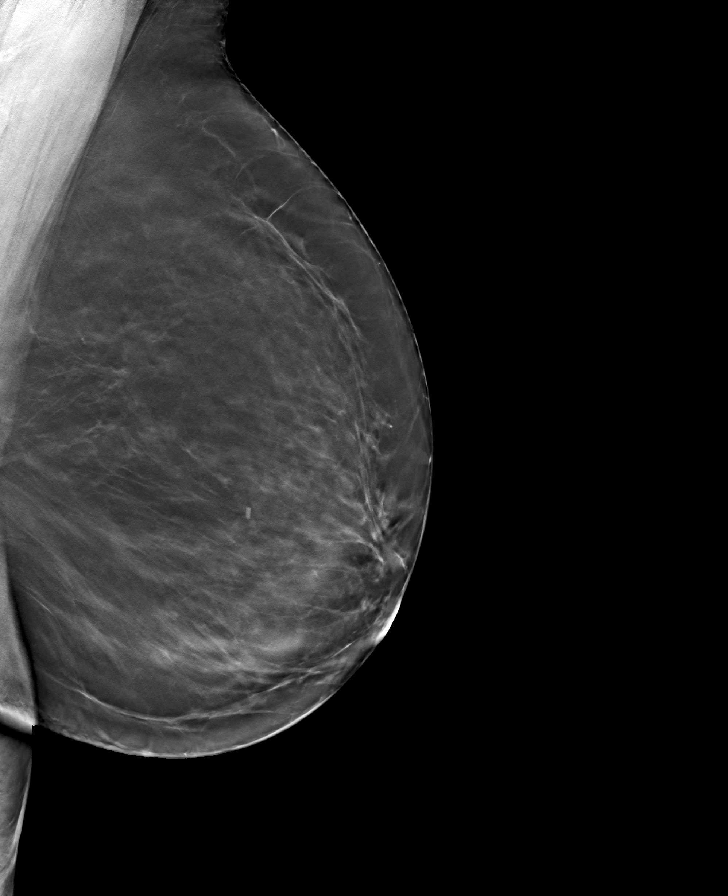

[8 of 24 positions shown; findings below may reference images not displayed]

ACR Breast Density Category b: There are scattered areas of
fibroglandular density.
FINDINGS: There are no findings suspicious for malignancy. Images were
processed with CAD.
IMPRESSION: No mammographic evidence of malignancy. A result letter of this
screening mammogram will be mailed directly to the patient.

RECOMMENDATION:
Screening mammogram in one year. (Code:[id number])

BI-RADS CATEGORY  1: Negative.

## 2019-01-30 ENCOUNTER — Ambulatory Visit: Payer: Medicare Other | Admitting: Nurse Practitioner

## 2019-03-04 ENCOUNTER — Telehealth: Payer: Self-pay | Admitting: *Deleted

## 2019-03-04 NOTE — Telephone Encounter (Addendum)
Pt has been having pain on her RLQ pain for the last week. IT is worse after she eats. She has not had any N/V and no fever.  She has constipation and sometimes difficult to evacuate. She cancelled follow up in July. Last office visit was 08/01/2018 with Walden Field. I have scheduled her to see Aliene Altes, PA tomorrow, 03/05/2019 at 1:30 pm.

## 2019-03-04 NOTE — Telephone Encounter (Signed)
Pt having back ache and abd pain on right side.  Constipation.  Wants to discuss.  [phone]

## 2019-03-04 NOTE — Telephone Encounter (Signed)
Pt is aware.  

## 2019-03-04 NOTE — Telephone Encounter (Signed)
Noted. Will see patient tomorrow and assess. If symptoms are severe, if she develops fever, or nausea and vomiting she should go to the ED.

## 2019-03-04 NOTE — Progress Notes (Signed)
Referring Provider: Fayrene Helper, MD Primary Care Physician:  Fayrene Helper, MD Primary GI Physician: Dr. Oneida Alar  Chief Complaint  Patient presents with  . Abdominal Pain    RLQ x 2 weeks  . Constipation    HPI:   Shirley Bush is a 63 y.o. female presenting today for RLQ pain and constipation. Patient called our office on 03/04/19 with RLQ pain x 1 week, worse after eating. Also with constipation and difficulty evacuating. She had previously cancelled her July appointment. Patient was subsequently scheduled to be seen today.   Patient last seen in our office on 08/01/18 for follow up for constipation and abdominal pain. At that time she was having bowel movements daily as long as she was eating right and since discontinuing gummy chews and soft gels. Not taking any constipation medications at that time. Reported completing a course of antibiotics prescribed by her GYN that improved her symptoms. Abdominal pain had also improved and was minimal and intermittent. Recommended continuing current medications. Scheduled for colonoscopy as it was time for surveillance.   TCS on 01/14/19 with one 3 mm polyp in the mid ascending colon. Moderate diverticulosis in entire colon. External and internal hemorrhoids. Tortuous left colon. Pathology with tubular adenoma. Recommended repeat in 5 years.   Today she state she has had gas pain after eating cabbage and cornbread last week. With the gas pain she also felt like she needed to have a BM but couldn't. She subsequently took soft gel gas-x and soft-gel stool softeners. States she knows the soft gels is what caused her constipation in the past. Subsequently she developed worsening constipation and RLQ pain. Also with some back pain. RLQ pain comes on when she feels like she needs to have a BM but can't. Usually improves or resolves once she has a BM. Pain is sharp. Up to an 7-8/10 at times. Now only with slight pain/discomfort. States her  bowels are still not moving regularly. She tried sea salt remedy to help with constipation Sunday. Everything flushed out. Mostly liquid, some pieces. Yesterday and and this morning her stools have been very small in caliber. Will feel like she needs to have a BM but can't. Will lay down or sit leaning to one side or the other to help "get things moving" and eventually will pass a small amount of stool with straining. No blood in the stools, no melena. No nausea or vomiting. No fever or chills.  Prior to taking the soft gels of gas-x and stool softeners, patient had been having regular BMs daily that were soft, formed, and normal caliber.   Reports a similar situation before when she was diagnosed with diverticulitis in 2019 and wonders if she has diverticulitis again. Denies fever, chills, nausea, vomiting. No GERD symptoms or upper abdominal pain. No urinary symptoms. No unintentional weight loss.   Takes excedrine migraine as needed. Sometimes daily. Ibuprofen as needed. Some recent daily use due to back pain.    Past Medical History:  Diagnosis Date  . Depression 2002  . Fibromyalgia   . Hypertension 2004  . Medial meniscus tear 04/2013   left  . Migraines     Past Surgical History:  Procedure Laterality Date  . [place]  . CESAREAN SECTION  1980  . CESAREAN SECTION  1992  . COLONOSCOPY  02/05/2009  . COLONOSCOPY N/A 03/06/2014   Procedure: COLONOSCOPY;  Surgeon: Danie Binder, MD;  [facility];  Service:  Endoscopy;  Laterality: N/A;  10:30 AM  . COLONOSCOPY WITH PROPOFOL N/A 01/14/2019   Procedure: COLONOSCOPY WITH PROPOFOL;  Surgeon: Danie Binder, MD;  [facility];  Service: Endoscopy;  Laterality: N/A;  2:00pm  . FINGER SURGERY Right    long finger  . KNEE ARTHROSCOPY WITH MEDIAL MENISECTOMY Left 04/24/2013   Procedure: LEFT KNEE ARTHROSCOPY CHONDROPLASTY WITH MEDIAL MENISECTOMY;  Surgeon: Ninetta Lights, MD;  Location: [facility];  Service: Orthopedics;  Laterality: Left;  . SHOULDER ARTHROSCOPY Left 2009  . SHOULDER ARTHROSCOPY Right   . SHOULDER ARTHROSCOPY WITH ROTATOR CUFF REPAIR Left 11/25/2004  . TOTAL KNEE ARTHROPLASTY Left 11/11/2014   Procedure: LEFT TOTAL KNEE ARTHROPLASTY;  Surgeon: Kathryne Hitch, MD;  Location: [facility];  Service: Orthopedics;  Laterality: Left;  . TUBAL LIGATION  1992  . UMBILICAL HERNIA REPAIR  2008    Current Outpatient Medications  Medication Sig Dispense Refill  . aspirin-acetaminophen-caffeine (EXCEDRIN MIGRAINE) 250-250-65 MG tablet Take by mouth every 6 (six) hours as needed for headache.    . chlorpheniramine (CHLOR-TRIMETON) 4 MG tablet Take 4 mg by mouth at bedtime as needed for allergies.    . magnesium oxide (MAG-OX) 400 (241.3 Mg) MG tablet Take one tablet once daily by mouth ,  for cramps (Patient taking differently: Take 400 mg by mouth daily. ) 30 tablet 5  . Multiple Vitamins-Minerals (HAIR SKIN AND NAILS FORMULA) TABS Take 3 tablets by mouth daily.    . phenylephrine (SUDAFED PE) 10 MG TABS tablet Take 10 mg by mouth every 4 (four) hours as needed.    . Pseudoephedrine-Naproxen Na (SINUS & COLD-D PO) Take by mouth as needed.    . triamterene-hydrochlorothiazide (MAXZIDE-25) 37.5-25 MG tablet Take one tablet by mouth, 2 times daily, 12 hours apart, for blood pressure (Patient taking differently: Take 1 tablet by mouth daily. ) 60 tablet 5   No current facility-administered medications for this visit.     Allergies as of 03/05/2019 - Review Complete 03/05/2019  Allergen Reaction Noted  . Lisinopril Cough 06/26/2018    Family History  Problem Relation Age of Onset  . Hypertension Mother   . Hypertension Father   . Alcohol abuse Father   . Seizures Father   . Migraines Sister   . Thyroid disease Sister   . Colon cancer Sister   . Thyroid disease Sister   . Cancer Sister        breast   . Cancer Maternal Aunt        gyne cancer    Social History    Socioeconomic History  . Marital status: Divorced    Spouse name: Not on file  . Number of children: 3  . Years of education: 12 grade   . Highest education level: 12th grade  Occupational History  . Occupation: disability   Social Needs  . Financial resource strain: Not hard at all  . Food insecurity    Worry: Never true    Inability: Never true  . Transportation needs    Medical: No    Non-medical: No  Tobacco Use  . Smoking status: Never Smoker  . Smokeless tobacco: Never Used  Substance and Sexual Activity  . Alcohol use: Yes    Comment: occasionally  . Drug use: No  . Sexual activity: Not Currently  Lifestyle  . Physical activity    Days per week: 0 days    Minutes per session: 0 min  . Stress: Only a  little  Relationships  . Social connections    Talks on phone: More than three times a week    Gets together: Once a week    Attends religious service: Never    Active member of club or organization: No    Attends meetings of clubs or organizations: Never    Relationship status: Divorced  Other Topics Concern  . Not on file  Social History Narrative   Son is living with her at the moment, patient states that she is isolating in her home and is depressed more lately     Review of Systems: Gen: See HPI  ENT: Admits to some nasal congestion and post nasal drip CV: Denies chest pain, palpitations, or peripheral edema.  Resp: Denies dyspnea at rest or cough.  GI: See HPI Derm: Denies rash, itching, dry skin Psych: Denies depression, anxiety Heme: Denies bruising, bleeding  Physical Exam: BP (!) 157/92   Pulse 92   Temp (!) 97.3 F (36.3 C) (Oral)   Ht 5\' 3"  (1.6 m)   Wt 164 lb 3.2 oz (74.5 kg)   BMI 29.09 kg/m  General: Alert and oriented. No distress noted. Pleasant and cooperative.  Head:  Normocephalic and atraumatic. Eyes:  Conjuctiva clear without scleral icterus. Heart:  S1, S2 present without murmurs appreciated. Lungs:  Clear to auscultation  bilaterally. No wheezes, rales, or rhonchi. No distress.  Abdomen:  +BS, soft, and non-distended. Very mild tenderness to deep palpation across the lower abdomen. No rebound or guarding. No HSM or masses noted. Msk:  Symmetrical without gross deformities. Normal posture. Extremities:  Without edema. Neurologic:  Alert and  oriented x4 Psych:  Normal mood and affect.

## 2019-03-05 ENCOUNTER — Other Ambulatory Visit: Payer: Self-pay

## 2019-03-05 ENCOUNTER — Encounter: Payer: Self-pay | Admitting: Gastroenterology

## 2019-03-05 ENCOUNTER — Ambulatory Visit: Payer: Medicare Other | Admitting: Gastroenterology

## 2019-03-05 VITALS — BP 157/92 | HR 92 | Temp 97.3°F | Ht 63.0 in | Wt 164.2 lb

## 2019-03-05 DIAGNOSIS — R1031 Right lower quadrant pain: Secondary | ICD-10-CM | POA: Diagnosis not present

## 2019-03-05 DIAGNOSIS — K5900 Constipation, unspecified: Secondary | ICD-10-CM

## 2019-03-05 NOTE — Assessment & Plan Note (Signed)
63 y.o. female who is presenting with 1 week of constipation and intermittent sharp RLQ pain that comes on when she is unable to have a BM and improved/relieved by BMs. Constipation developed after taking gas-x soft gels and soft gel stool softeners due to developing gas after eating cabbage and cornbread. Historically, soft gels were identified as a trigger for constipation. Bowels had been moving normally prior to this. Abdominal pain is minimal today. BMs are with straining and small. Exam with very mild tenderness to deep palpation across the lower abdomen. Recent colonoscopy on 01/14/19 with one tubular adenoma, moderate diverticulosis throughout the colon, and tortuous left colon.   I suspect her constipation is causing her abdominal pain as it seems to improve after BMs, I will plan to optimize her bowel function at this time. Diverticulitis is still in the differential, but as pain is minimal today, exam is only with minimal tenderness to palpation, I am less suspicious. However, I will keep a low threshold for obtaining CT abdomen and pelvis if symptoms do not improve quickly with optomizing bowel function.   Trulance 3 mg samples provided today. Patient is to take 1 tablet daily.  As MiraLAX as worked well for her constipation in the past, I have advised her to start MiraLAX 1 capful (17g) daily after completing the Trulance samples if she continues to have constipation.  She was instructed to call with a progress report in 1 week or if pain worsens or is persisting despite improved bowel function.  Follow-up in 3 months.

## 2019-03-05 NOTE — Assessment & Plan Note (Signed)
Addressed under constipation.  

## 2019-03-05 NOTE — Patient Instructions (Addendum)
Please start taking Trulance 3 mg daily to help with constipation. I have provided 7 days of samples. You may use MiraLAX 1 capful daily as needed for constipation moving forward as this has worked well for you in the past.   Continue to avoid known triggers of constipation and foods that cause gas.   Please call in 1 week with an update on how you are doing. If you do not have any improvement after your bowels start moving or if you have worsening pain we will go ahead and get you scheduled for a CT of your abdomen.   Otherwise we will plan to see you back in 3 months.   Aliene Altes, PA-C [facility]

## 2019-03-06 NOTE — Progress Notes (Signed)
cc'ed to pcp °

## 2019-03-17 ENCOUNTER — Encounter: Payer: Self-pay | Admitting: Family Medicine

## 2019-04-08 ENCOUNTER — Encounter: Payer: Self-pay | Admitting: Family Medicine

## 2019-04-08 ENCOUNTER — Telehealth: Payer: Self-pay | Admitting: *Deleted

## 2019-04-08 ENCOUNTER — Ambulatory Visit (INDEPENDENT_AMBULATORY_CARE_PROVIDER_SITE_OTHER): Payer: Medicare Other | Admitting: Family Medicine

## 2019-04-08 ENCOUNTER — Other Ambulatory Visit (HOSPITAL_COMMUNITY)
Admission: RE | Admit: 2019-04-08 | Discharge: 2019-04-08 | Disposition: A | Discharge status: Home / Self Care | Payer: Medicare Other | Source: Ambulatory Visit | Attending: Family Medicine | Admitting: Family Medicine

## 2019-04-08 ENCOUNTER — Other Ambulatory Visit: Payer: Self-pay

## 2019-04-08 VITALS — BP 138/84 | HR 89 | Temp 98.6°F | Resp 15 | Ht 63.0 in | Wt 163.0 lb

## 2019-04-08 DIAGNOSIS — J019 Acute sinusitis, unspecified: Secondary | ICD-10-CM | POA: Diagnosis not present

## 2019-04-08 DIAGNOSIS — Z124 Encounter for screening for malignant neoplasm of cervix: Secondary | ICD-10-CM

## 2019-04-08 DIAGNOSIS — Z0000 Encounter for general adult medical examination without abnormal findings: Secondary | ICD-10-CM

## 2019-04-08 DIAGNOSIS — Z1151 Encounter for screening for human papillomavirus (HPV): Secondary | ICD-10-CM | POA: Insufficient documentation

## 2019-04-08 DIAGNOSIS — R7989 Other specified abnormal findings of blood chemistry: Secondary | ICD-10-CM

## 2019-04-08 DIAGNOSIS — Z0001 Encounter for general adult medical examination with abnormal findings: Secondary | ICD-10-CM | POA: Diagnosis not present

## 2019-04-08 DIAGNOSIS — E559 Vitamin D deficiency, unspecified: Secondary | ICD-10-CM

## 2019-04-08 DIAGNOSIS — Z1231 Encounter for screening mammogram for malignant neoplasm of breast: Secondary | ICD-10-CM

## 2019-04-08 DIAGNOSIS — I10 Essential (primary) hypertension: Secondary | ICD-10-CM

## 2019-04-08 DIAGNOSIS — Z13220 Encounter for screening for lipoid disorders: Secondary | ICD-10-CM

## 2019-04-08 MED ORDER — AZITHROMYCIN 250 MG PO TABS: ORAL_TABLET | ORAL | 0 refills | Status: DC

## 2019-04-08 MED ORDER — FLUCONAZOLE 150 MG PO TABS: 150.0000 mg | ORAL_TABLET | Freq: Once | ORAL | 0 refills | Status: AC

## 2019-04-08 NOTE — Telephone Encounter (Signed)
Crystal with [facility] called about pt prescription that was sent over. They need a diagnosis code before this can be filled. She can be reached at [phone]

## 2019-04-08 NOTE — Patient Instructions (Addendum)
Keep Wellness appointment for January  mD f/u in 6 months, call if you need me sooner  Flu vaccine appointment in 2 to 3 weeks to be scheduled at checkout Please schedule mammogram due in December at checkout  Need cBC, fasting lipid, cmp and eGFr tSH and vit D as soon as possible  You are treated for sinus infection, azithromycin and fluconazole are prescribed  Thanks for choosing [facility], we consider it a privelige to serve you.

## 2019-04-08 NOTE — Telephone Encounter (Signed)
Diagnosis code entered on prescription and resent to [facility]

## 2019-04-09 DIAGNOSIS — I10 Essential (primary) hypertension: Secondary | ICD-10-CM | POA: Diagnosis not present

## 2019-04-09 DIAGNOSIS — Z13220 Encounter for screening for lipoid disorders: Secondary | ICD-10-CM | POA: Diagnosis not present

## 2019-04-09 DIAGNOSIS — E559 Vitamin D deficiency, unspecified: Secondary | ICD-10-CM | POA: Diagnosis not present

## 2019-04-09 DIAGNOSIS — R7989 Other specified abnormal findings of blood chemistry: Secondary | ICD-10-CM | POA: Diagnosis not present

## 2019-04-11 LAB — COMPLETE METABOLIC PANEL WITH GFR
AG Ratio: 1.5 (calc) (ref 1.0–2.5)
ALT: 22 U/L (ref 6–29)
AST: 41 U/L — ABNORMAL HIGH (ref 10–35)
Albumin: 4.6 g/dL (ref 3.6–5.1)
Alkaline phosphatase (APISO): 82 U/L (ref 37–153)
BUN: 10 mg/dL (ref 7–25)
CO2: 29 mmol/L (ref 20–32)
Calcium: 10.1 mg/dL (ref 8.6–10.4)
Chloride: 98 mmol/L (ref 98–110)
Creat: 0.91 mg/dL (ref 0.50–0.99)
GFR, Est African American: 79 mL/min/{1.73_m2} (ref >=60)
GFR, Est Non African American: 68 mL/min/{1.73_m2} (ref >=60)
Globulin: 3.1 g/dL (calc) (ref 1.9–3.7)
Glucose, Bld: 102 mg/dL — ABNORMAL HIGH (ref 65–99)
Potassium: 4.5 mmol/L (ref 3.5–5.3)
Sodium: 135 mmol/L (ref 135–146)
Total Bilirubin: 0.6 mg/dL (ref 0.2–1.2)
Total Protein: 7.7 g/dL (ref 6.1–8.1)

## 2019-04-11 LAB — CBC
HCT: 41.5 % (ref 35.0–45.0)
Hemoglobin: 14.3 g/dL (ref 11.7–15.5)
MCH: 30.1 pg (ref 27.0–33.0)
MCHC: 34.5 g/dL (ref 32.0–36.0)
MCV: 87.4 fL (ref 80.0–100.0)
MPV: 10.4 fL (ref 7.5–12.5)
Platelets: 415 10*3/uL — ABNORMAL HIGH (ref 140–400)
RBC: 4.75 10*6/uL (ref 3.80–5.10)
RDW: 13.1 % (ref 11.0–15.0)
WBC: 8.1 10*3/uL (ref 3.8–10.8)

## 2019-04-11 LAB — TEST AUTHORIZATION

## 2019-04-11 LAB — LIPID PANEL
Cholesterol: 257 mg/dL — ABNORMAL HIGH (ref <200)
HDL: 47 mg/dL — ABNORMAL LOW (ref >=50)
LDL Cholesterol (Calc): 181 mg/dL (calc) — ABNORMAL HIGH
Non-HDL Cholesterol (Calc): 210 mg/dL (calc) — ABNORMAL HIGH (ref <130)
Total CHOL/HDL Ratio: 5.5 (calc) — ABNORMAL HIGH (ref <5.0)
Triglycerides: 150 mg/dL — ABNORMAL HIGH (ref <150)

## 2019-04-11 LAB — T4, FREE: Free T4: 1.1 ng/dL (ref 0.8–1.8)

## 2019-04-11 LAB — TSH: TSH: 6.56 mIU/L — ABNORMAL HIGH (ref 0.40–4.50)

## 2019-04-11 LAB — VITAMIN D 25 HYDROXY (VIT D DEFICIENCY, FRACTURES): Vit D, 25-Hydroxy: 45 ng/mL (ref 30–100)

## 2019-04-11 LAB — T3, FREE: T3, Free: 3.4 pg/mL (ref 2.3–4.2)

## 2019-04-12 ENCOUNTER — Encounter: Payer: Self-pay | Admitting: Family Medicine

## 2019-04-12 LAB — CERVICOVAGINAL ANCILLARY ONLY: HPV: NOT DETECTED

## 2019-04-12 NOTE — Assessment & Plan Note (Signed)

## 2019-04-12 NOTE — Assessment & Plan Note (Signed)
z pack a prescribed an  Saline flushes recommended

## 2019-04-12 NOTE — Progress Notes (Signed)
    Shirley Bush     MRN: [id number]      DOB: [birth year]  HPI: Patient is in for annual physical exam. C/o sinus pressure , clear drainage with ear fullness Recent labs, if available are reviewed. Immunization is reviewed , and  updated    PE: BP 138/84   Pulse 89   Temp 98.6 F (37 C) (Temporal)   Resp 15   Ht 5\' 3"  (1.6 m)   Wt 163 lb (73.9 kg)   SpO2 97%   BMI 28.87 kg/m   Pleasant  female, alert and oriented x 3, in no cardio-pulmonary distress. Afebrile. HEENT No facial trauma or asymetry. Maxillary Sinuses tender.  Extra occullar muscles intact, pupils equally reactive to light. External ears normal, tympanic membranes clear. Oropharynx moist, no exudate. Neck: supple, no adenopathy,JVD or thyromegaly.No bruits.  Chest: Clear to ascultation bilaterally.No crackles or wheezes. Non tender to palpation  Breast: No asymetry,no masses or lumps. No tenderness. No nipple discharge or inversion. No axillary or supraclavicular adenopathy  Cardiovascular system; Heart sounds normal,  S1 and  S2 ,no S3.  No murmur, or thrill. Apical beat not displaced Peripheral pulses normal.  Abdomen: Soft, non tender, no organomegaly or masses. No bruits. Bowel sounds normal. No guarding, tenderness or rebound.    GU: External genitalia normal female genitalia , normal female distribution of hair. No lesions. Urethral meatus normal in size, no  Prolapse, no lesions visibly  Present. Bladder non tender. Vagina pink and moist , with no visible lesions , discharge present . Adequate pelvic support no  cystocele or rectocele noted Cervix pink and appears healthy, no lesions or ulcerations noted, no discharge noted from os Uterus normal size, no adnexal masses, no cervical motion or adnexal tenderness.   Musculoskeletal exam: Full ROM of spine, hips , shoulders and knees. No deformity ,swelling or crepitus noted. No muscle wasting or atrophy.   Neurologic: Cranial  nerves 2 to 12 intact. Power, tone ,sensation and reflexes normal throughout. No disturbance in gait. No tremor.  Skin: Intact, no ulceration, erythema , scaling or rash noted. Pigmentation normal throughout  Psych; Normal mood and affect. Judgement and concentration normal   Assessment & Plan:  Annual physical exam Annual exam as documented. Counseling done  re healthy lifestyle involving commitment to 150 minutes exercise per week, heart healthy diet, and attaining healthy weight.The importance of adequate sleep also discussed. Regular seat belt use and home safety, is also discussed. Changes in health habits are decided on by the patient with goals and time frames  set for achieving them. Immunization and cancer screening needs are specifically addressed at this visit.   Sinusitis, acute z pack a prescribed an  Saline flushes recommended

## 2019-04-15 LAB — CYTOLOGY - PAP
Diagnosis: NEGATIVE
High risk HPV: NEGATIVE
Molecular Disclaimer: 56
Molecular Disclaimer: NORMAL

## 2019-04-16 ENCOUNTER — Encounter: Payer: Self-pay | Admitting: Family Medicine

## 2019-04-17 ENCOUNTER — Telehealth: Payer: Self-pay

## 2019-04-17 DIAGNOSIS — I10 Essential (primary) hypertension: Secondary | ICD-10-CM

## 2019-04-17 DIAGNOSIS — R7989 Other specified abnormal findings of blood chemistry: Secondary | ICD-10-CM

## 2019-04-17 DIAGNOSIS — Z13220 Encounter for screening for lipoid disorders: Secondary | ICD-10-CM

## 2019-04-17 MED ORDER — ROSUVASTATIN CALCIUM 10 MG PO TABS: 10.0000 mg | ORAL_TABLET | Freq: Every day | ORAL | 3 refills | Status: DC

## 2019-04-17 NOTE — Telephone Encounter (Signed)
Labs ordered to be drawn 5 days before next visit and Crestor ordered.

## 2019-04-28 ENCOUNTER — Ambulatory Visit (INDEPENDENT_AMBULATORY_CARE_PROVIDER_SITE_OTHER): Payer: Medicare Other

## 2019-04-28 ENCOUNTER — Other Ambulatory Visit: Payer: Self-pay

## 2019-04-28 DIAGNOSIS — Z23 Encounter for immunization: Secondary | ICD-10-CM | POA: Diagnosis not present

## 2019-04-30 ENCOUNTER — Telehealth: Payer: Self-pay | Admitting: Family Medicine

## 2019-04-30 NOTE — Telephone Encounter (Signed)
Pt is calling in she is having a reaction to some medicine

## 2019-05-01 NOTE — Telephone Encounter (Signed)
crestor added to allergies and discontinued from med list

## 2019-05-01 NOTE — Telephone Encounter (Signed)
pls advise stop crestor, document under allergiies the rash, thanks!

## 2019-05-01 NOTE — Telephone Encounter (Signed)
Spoke with patient and she stated that her rash has gotten worse since she started the Crestor. She takes Benadryl and that helps the itching. She takes Benadryl for the rash she has due to Magnesium she takes for muscle cramps. Please advise

## 2019-05-13 ENCOUNTER — Other Ambulatory Visit: Payer: Self-pay

## 2019-05-13 ENCOUNTER — Encounter: Payer: Self-pay | Admitting: Family Medicine

## 2019-05-13 ENCOUNTER — Ambulatory Visit (INDEPENDENT_AMBULATORY_CARE_PROVIDER_SITE_OTHER): Payer: Medicare Other | Admitting: Family Medicine

## 2019-05-13 VITALS — BP 134/88 | Ht 63.0 in | Wt 160.0 lb

## 2019-05-13 DIAGNOSIS — F419 Anxiety disorder, unspecified: Secondary | ICD-10-CM | POA: Diagnosis not present

## 2019-05-13 DIAGNOSIS — I10 Essential (primary) hypertension: Secondary | ICD-10-CM | POA: Diagnosis not present

## 2019-05-13 MED ORDER — BUSPIRONE HCL 5 MG PO TABS: 5.0000 mg | ORAL_TABLET | Freq: Two times a day (BID) | ORAL | 2 refills | Status: DC

## 2019-05-13 NOTE — Patient Instructions (Signed)
F/u with MD telephone visit in 6 weeks, call if you need me sooner  I have prescribed Buspar one twice daily for anxiety, this will help  Try to use techniques that you know of to help when you feel stressed or overwhelmed   Try to see if you can speak with your family to get any additional help from them with caring for your Mom and grand kids, also I strongly recommend that you speak with a therapist on the phone. If you decide that you need this please let us know

## 2019-05-13 NOTE — Progress Notes (Signed)
Virtual Visit via Telephone Note  I connected with Shirley Bush on 05/13/19 at  3:20 PM EDT by telephone and verified that I am speaking with the correct person using two identifiers.  Location: Patient: home Provider: office   I discussed the limitations, risks, security and privacy concerns of performing an evaluation and management service by telephone and the availability of in person appointments. I also discussed with the patient that there may be a patient responsible charge related to this service. The patient expressed understanding and agreed to proceed.   History of Present Illness: C/o daily headaches due to stress of caring for grandchildren and mother, very irritable, poor sleep , headache, worse in the past 2 weeks. Has been caring for grand children and home schooling th grandkids , and is overwhelmed and feels incapapble of doing this but has no alternative Denies recent fever or chills. Denies sinus pressure, nasal congestion, ear pain or sore throat. Denies chest congestion, productive cough or wheezing. Denies chest pains, palpitations and leg swelling Denies abdominal pain, nausea, vomiting,diarrhea or constipation.   Denies dysuria, frequency, hesitancy or incontinence. Denies joint pain, swelling and limitation in mobility. Denies skin break down or rash.       Observations/Objective: BP 134/88   Ht 5\' 3"  (1.6 m)   Wt 160 lb (72.6 kg)   BMI 28.34 kg/m  Good communication with no confusion and intact memory. Alert and oriented x 3 No signs of respiratory distress during speech. Tearful at times during the interview ,and  anxious    Assessment and Plan: Anxiety Uncontrolled, start twice daily buspar, encouraged her to start therapy , but declines patient verbalized her sources of stress and was  Encouraged to vent for 10 minutes   Essential hypertension Controlled, no change in medication DASH diet and commitment to daily physical activity for a  minimum of 30 minutes discussed and encouraged, as a part of hypertension management. The importance of attaining a healthy weight is also discussed.  BP/Weight 05/13/2019 04/08/2019 03/05/2019 01/14/2019 10/22/2018 09/03/2018 123456  Systolic BP Q000111Q 0000000 [id number] A999333 0000000 123456 0000000  Diastolic BP 88 84 92 87 90 96 97  Wt. (Lbs) 160 163 164.2 158 160 166 163  BMI 28.34 28.87 29.09 27.99 28.34 29.41 28.87  Some encounter information is confidential and restricted. Go to Review Flowsheets activity to see all data.          Follow Up Instructions:    I discussed the assessment and treatment plan with the patient. The patient was provided an opportunity to ask questions and all were answered. The patient agreed with the plan and demonstrated an understanding of the instructions.   The patient was advised to call back or seek an in-person evaluation if the symptoms worsen or if the condition fails to improve as anticipated.  I provided 21 minutes of non-face-to-face time during this encounter.   Tula Nakayama, MD

## 2019-05-15 ENCOUNTER — Other Ambulatory Visit: Payer: Self-pay | Admitting: Family Medicine

## 2019-05-18 DIAGNOSIS — F418 Other specified anxiety disorders: Secondary | ICD-10-CM | POA: Insufficient documentation

## 2019-05-18 DIAGNOSIS — F419 Anxiety disorder, unspecified: Secondary | ICD-10-CM | POA: Insufficient documentation

## 2019-05-18 NOTE — Assessment & Plan Note (Addendum)
Uncontrolled, start twice daily buspar, encouraged her to start therapy , but declines patient verbalized her sources of stress and was  Encouraged to vent for 10 minutes

## 2019-05-18 NOTE — Assessment & Plan Note (Signed)
Controlled, no change in medication DASH diet and commitment to daily physical activity for a minimum of 30 minutes discussed and encouraged, as a part of hypertension management. The importance of attaining a healthy weight is also discussed.  BP/Weight 05/13/2019 04/08/2019 03/05/2019 01/14/2019 10/22/2018 09/03/2018 123456  Systolic BP Q000111Q 0000000 A999333 A999333 0000000 123456 0000000  Diastolic BP 88 84 92 87 90 96 97  Wt. (Lbs) 160 163 164.2 158 160 166 163  BMI 28.34 28.87 29.09 27.99 28.34 29.41 28.87  Some encounter information is confidential and restricted. Go to Review Flowsheets activity to see all data.

## 2019-06-04 NOTE — Progress Notes (Signed)
Primary Care Physician:  Fayrene Helper, MD  Primary GI: Dr. Oneida Alar  Patient Location: Home   Provider Location: [facility] office   Reason for Visit: Follow-up of constipation   Persons present on the virtual encounter, with roles: Aliene Altes, PA-C (provider); Shirley Bush (patient)   Total time (minutes) spent on medical discussion: 12 minutes   Due to COVID-19, visit was conducted using virtual method.  Visit was requested by patient.  Virtual Visit via Telephone Note Due to COVID-19, visit is conducted virtually and was requested by patient.   I connected with Shirley Bush on 06/05/19 at 10:00 AM EST by telephone and verified that I am speaking with the correct person using two identifiers.   I discussed the limitations, risks, security and privacy concerns of performing an evaluation and management service by telephone and the availability of in person appointments. I also discussed with the patient that there may be a patient responsible charge related to this service. The patient expressed understanding and agreed to proceed.  Chief Complaint  Patient presents with  . Constipation    doing better with wheat bread and tea with probiotic     History of Present Illness: [name] is a 63 y.o. female presenting today for follow-up of constipation and right lower quadrant abdominal pain. She was last seen in our office for the same on 03/05/2019.TCS on 01/14/19 with one 3 mm polyp in the mid ascending colon. Moderate diverticulosis in entire colon. External and internal hemorrhoids. Tortuous left colon. Pathology with tubular adenoma. Recommended repeat in 5 years.   At last visit, he had return of constipation after taking softgel Gas-X and soft gel stool softeners the week prior.  Prior to this, she was having regular BMs daily.  Softgels have historically been known triggers for her constipation.  With worsening constipation, she also developed right lower  quadrant pain as well as some back pain.  RLQ pain occurs when she felt the need to have a BM but could not.  Pain improved or resolved after a BM.  At the time of office visit, she was with very mild abdominal pain and bowel movements had not returned to normal.  She did report having similar symptoms in 2019 with a bout of diverticulitis.  She was provided with samples of Trulance 3 mg to get her bowels moving and advised to resume MiraLAX daily after completing Trulance samples as MiraLAX worked well for her in the past.  1 week progress report was requested and she was also advised to call us if pain persisted or worsen despite improvement in bowel function. Would have low threshold for pursuing CT A/P.  Plans to follow-up in 3 months.  Progress report not received.   Today she states her constipation has improved. She has started eating whole wheat bread, and increased vegetable intake. Trying not to consume too many gas producing foods. This has helped reduce her gas. She does still have gas/bloating at times. Currently BMs are daily. Stools are soft and formed. No abdominal pain. No blood in the stool or black stools. No GERD symptoms, no nausea or vomiting, no dysphagia. No unintentional weight loss.   Not taking MiraLAX. Will eat sauerkraut a couple times a week. This will also help her bowel move but causes gas.   Past Medical History:  Diagnosis Date  . Depression 2002  . Fibromyalgia   . Hypertension 2004  . Medial meniscus tear 04/2013   left  .  Migraines      Past Surgical History:  Procedure Laterality Date  . [place]  . CESAREAN SECTION  1980  . CESAREAN SECTION  1992  . COLONOSCOPY  02/05/2009  . COLONOSCOPY N/A 03/06/2014   Procedure: COLONOSCOPY;  Surgeon: Danie Binder, MD;  [facility];  Service: Endoscopy;  Laterality: N/A;  10:30 AM  . COLONOSCOPY WITH PROPOFOL N/A 01/14/2019   Procedure: COLONOSCOPY WITH PROPOFOL;  Surgeon: Danie Binder,  MD;  [facility];  Service: Endoscopy;  Laterality: N/A;  2:00pm  . FINGER SURGERY Right    long finger  . KNEE ARTHROSCOPY WITH MEDIAL MENISECTOMY Left 04/24/2013   Procedure: LEFT KNEE ARTHROSCOPY CHONDROPLASTY WITH MEDIAL MENISECTOMY;  Surgeon: Ninetta Lights, MD;  Location: [place];  Service: Orthopedics;  Laterality: Left;  . SHOULDER ARTHROSCOPY Left 2009  . SHOULDER ARTHROSCOPY Right   . SHOULDER ARTHROSCOPY WITH ROTATOR CUFF REPAIR Left 11/25/2004  . TOTAL KNEE ARTHROPLASTY Left 11/11/2014   Procedure: LEFT TOTAL KNEE ARTHROPLASTY;  Surgeon: Kathryne Hitch, MD;  Location: [place];  Service: Orthopedics;  Laterality: Left;  . TUBAL LIGATION  1992  . UMBILICAL HERNIA REPAIR  2008     Current Meds  Medication Sig  . aspirin-acetaminophen-caffeine (EXCEDRIN MIGRAINE) [id number] MG tablet Take by mouth every 6 (six) hours as needed for headache.  . busPIRone (BUSPAR) 5 MG tablet Take 1 tablet (5 mg total) by mouth 2 (two) times daily.  . magnesium oxide (MAG-OX) 400 (241.3 Mg) MG tablet TAKE 1 TABLET BY MOUTH ONCE DAILY FOR CRAMPS  . Multiple Vitamins-Minerals (HAIR SKIN AND NAILS FORMULA) TABS Take 3 tablets by mouth daily.  . Simethicone (GAS-X PO) Take by mouth as needed.  . triamterene-hydrochlorothiazide (MAXZIDE-25) 37.5-25 MG tablet Take one tablet by mouth, 2 times daily, 12 hours apart, for blood pressure (Patient taking differently: Take 1 tablet by mouth daily. )     Family History  Problem Relation Age of Onset  . Hypertension Mother   . Hypertension Father   . Alcohol abuse Father   . Seizures Father   . Migraines Sister   . Thyroid disease Sister   . Colon cancer Sister   . Thyroid disease Sister   . Cancer Sister        breast   . Cancer Maternal Aunt        gyne cancer    Social History   Socioeconomic History  . Marital status: Divorced    Spouse name: Not on file  . Number of children: 3  . Years of education: 12 grade   .  Highest education level: 12th grade  Occupational History  . Occupation: disability   Social Needs  . Financial resource strain: Not hard at all  . Food insecurity    Worry: Never true    Inability: Never true  . Transportation needs    Medical: No    Non-medical: No  Tobacco Use  . Smoking status: Never Smoker  . Smokeless tobacco: Never Used  Substance and Sexual Activity  . Alcohol use: Yes    Comment: occasionally  . Drug use: No  . Sexual activity: Not Currently  Lifestyle  . Physical activity    Days per week: 0 days    Minutes per session: 0 min  . Stress: Only a little  Relationships  . Social connections    Talks on phone: More than three times a week    Gets  together: Once a week    Attends religious service: Never    Active member of club or organization: No    Attends meetings of clubs or organizations: Never    Relationship status: Divorced  Other Topics Concern  . Not on file  Social History Narrative   Son is living with her at the moment, patient states that she is isolating in her home and is depressed more lately      Review of Systems: Gen: Denies fever, chills, lightheadedness, dizziness, or feeling like she will pass out. CV: Denies chest pain, palpitations. Resp: Denies dyspnea at rest or cough. GI: see HPI Derm: Denies rash Psych: Admits to recent episode of anxiety.  She was placed on BuSpar and this has helped. Heme: Denies bruising, bleeding.  Observations/Objective: No distress. Unable to perform physical exam due to telephone encounter. No video available.   Assessment and Plan: 63 year old female presenting for follow-up of constipation.  At the time of last visit in August 2020, she had return of constipation after taking Gas-X softgels and soft gelbstool softeners which have been known triggers of constipation for her.  She has since stopped taking softgels and has also changed her diet increasing vegetables and wheat bread.  Currently  having soft, formed BMs daily.  Denies abdominal pain, BRBPR, melena, or unintentional weight loss.  No alarm symptoms.  Colonoscopy up-to-date.  Due for repeat in 2025. Continue dietary changes, drink enough water to keep urine pale yellow to clear, and use MriaLAX 1 capful as needed.   Abdominal distention (gaseous): Gas and bloating has improved with patient avoiding known dietary triggers.  Continues to have gas at times.  Admits to consuming sauerkraut a couple times a week which produces gas.  Advise she continue to avoid gas producing foods, use simethicone tablets rather than softgels for immediate relief or use Beano tablets prior to meals to prevent gas production.  Abdominal bloating handout was provided.   Follow Up Instructions: Follow-up in 6 months. Call if questions or concerns prior.     I discussed the assessment and treatment plan with the patient. The patient was provided an opportunity to ask questions and all were answered. The patient agreed with the plan and demonstrated an understanding of the instructions.   The patient was advised to call back or seek an in-person evaluation if the symptoms worsen or if the condition fails to improve as anticipated.  I provided 12 minutes of non-face-to-face time during this encounter.  Aliene Altes, PA-C [facility]

## 2019-06-05 ENCOUNTER — Ambulatory Visit (INDEPENDENT_AMBULATORY_CARE_PROVIDER_SITE_OTHER): Payer: Medicare Other | Admitting: Gastroenterology

## 2019-06-05 ENCOUNTER — Encounter: Payer: Self-pay | Admitting: Gastroenterology

## 2019-06-05 ENCOUNTER — Other Ambulatory Visit: Payer: Self-pay

## 2019-06-05 DIAGNOSIS — K5900 Constipation, unspecified: Secondary | ICD-10-CM

## 2019-06-05 DIAGNOSIS — R140 Abdominal distension (gaseous): Secondary | ICD-10-CM | POA: Diagnosis not present

## 2019-06-05 NOTE — Patient Instructions (Addendum)
I am glad your constipation has improved with changing your diet!  You may add MiraLAX 1 capful daily as needed for constipation.   Avoid foods that cause gas and bloating. See handout below.   If you need relief from gas, you may take simethicone tablets rather than soft gels.   You could also consider trying beano before meals to see if this will prevent gas production.   Drink plenty of water to keep your urine pale yellow to clear.   We will see you back in 6 months. Call if you have questions or concerns prior.   Aliene Altes, PA-C [facility]    Abdominal Bloating When you have abdominal bloating, your abdomen may feel full, tight, or painful. It may also look bigger than normal or swollen (distended). Common causes of abdominal bloating include:  Swallowing air.  Constipation.  Problems digesting food.  Eating too much.  Irritable bowel syndrome. This is a condition that affects the large intestine.  Lactose intolerance. This is an inability to digest lactose, a natural sugar in dairy products.  Celiac disease. This is a condition that affects the ability to digest gluten, a protein found in some grains.  Gastroparesis. This is a condition that slows down the movement of food in the stomach and small intestine. It is more common in people with diabetes mellitus.  Gastroesophageal reflux disease (GERD). This is a digestive condition that makes stomach acid flow back into the esophagus.  Urinary retention. This means that the body is holding onto urine, and the bladder cannot be emptied all the way. Follow these instructions at home: Eating and drinking  Avoid eating too much.  Try not to swallow air while talking or eating.  Avoid eating while lying down.  Avoid these foods and drinks: ? Foods that cause gas, such as broccoli, cabbage, cauliflower, and baked beans. ? Carbonated drinks. ? Hard candy. ? Chewing gum. Medicines  Take  over-the-counter and prescription medicines only as told by your health care provider.  Take probiotic medicines. These medicines contain live bacteria or yeasts that can help digestion.  Take coated peppermint oil capsules. Activity  Try to exercise regularly. Exercise may help to relieve bloating that is caused by gas and relieve constipation. General instructions  Keep all follow-up visits as told by your health care provider. This is important. Contact a health care provider if:  You have nausea and vomiting.  You have diarrhea.  You have abdominal pain.  You have unusual weight loss or weight gain.  You have severe pain, and medicines do not help. Get help right away if:  You have severe chest pain.  You have trouble breathing.  You have shortness of breath.  You have trouble urinating.  You have darker urine than normal.  You have blood in your stools or have dark, tarry stools. Summary  Abdominal bloating means that the abdomen is swollen.  Common causes of abdominal bloating are swallowing air, constipation, and problems digesting food.  Avoid eating too much and avoid swallowing air.  Avoid foods that cause gas, carbonated drinks, hard candy, and chewing gum. This information is not intended to replace advice given to you by your health care provider. Make sure you discuss any questions you have with your health care provider. Document Released: 08/04/2016 Document Revised: 10/21/2018 Document Reviewed: 08/04/2016 Elsevier Patient Education  2020 [employer].

## 2019-06-05 NOTE — Progress Notes (Signed)
cc'ed to pcp °

## 2019-06-18 ENCOUNTER — Other Ambulatory Visit: Payer: Self-pay

## 2019-06-18 MED ORDER — MAGNESIUM OXIDE 400 (241.3 MG) MG PO TABS: ORAL_TABLET | ORAL | 2 refills | Status: DC

## 2019-06-20 ENCOUNTER — Ambulatory Visit (HOSPITAL_COMMUNITY)
Admission: RE | Admit: 2019-06-20 | Discharge: 2019-06-20 | Disposition: A | Discharge status: Home / Self Care | Payer: Medicare Other | Source: Ambulatory Visit | Attending: Family Medicine | Admitting: Family Medicine

## 2019-06-20 ENCOUNTER — Other Ambulatory Visit: Payer: Self-pay

## 2019-06-20 DIAGNOSIS — Z1231 Encounter for screening mammogram for malignant neoplasm of breast: Secondary | ICD-10-CM

## 2019-07-16 ENCOUNTER — Encounter: Payer: Self-pay | Admitting: Family Medicine

## 2019-07-16 ENCOUNTER — Other Ambulatory Visit: Payer: Self-pay

## 2019-07-16 ENCOUNTER — Ambulatory Visit (INDEPENDENT_AMBULATORY_CARE_PROVIDER_SITE_OTHER): Payer: Medicare Other | Admitting: Family Medicine

## 2019-07-16 VITALS — BP 160/96 | HR 79 | Temp 98.6°F | Resp 15 | Ht 63.0 in | Wt 163.0 lb

## 2019-07-16 DIAGNOSIS — Z13220 Encounter for screening for lipoid disorders: Secondary | ICD-10-CM | POA: Diagnosis not present

## 2019-07-16 DIAGNOSIS — I10 Essential (primary) hypertension: Secondary | ICD-10-CM

## 2019-07-16 DIAGNOSIS — L609 Nail disorder, unspecified: Secondary | ICD-10-CM

## 2019-07-16 DIAGNOSIS — B350 Tinea barbae and tinea capitis: Secondary | ICD-10-CM | POA: Diagnosis not present

## 2019-07-16 DIAGNOSIS — L659 Nonscarring hair loss, unspecified: Secondary | ICD-10-CM

## 2019-07-16 MED ORDER — AMLODIPINE BESYLATE 5 MG PO TABS: 5.0000 mg | ORAL_TABLET | Freq: Every day | ORAL | 2 refills | Status: DC

## 2019-07-16 MED ORDER — TERBINAFINE HCL 250 MG PO TABS: 250.0000 mg | ORAL_TABLET | Freq: Every day | ORAL | 1 refills | Status: DC

## 2019-07-16 NOTE — Patient Instructions (Addendum)
F/U in office with MD , 2nd week in March, call if you need me sooner  New blood pressure medication , amlodipine is prescribed  New for hair loss, is once daily terbinafine, and you are also referred to Dermatologist about hair loss and ridged nails  Please take crestor 3 times weekly and cut back  on fat  Please get TSH and cmp and EGFR today today at  Lab  Fasting lipid and hepatic in 2  Months, 5 days before your next visit  Thanks for choosing [facility], we consider it a privelige to serve you.

## 2019-07-16 NOTE — Progress Notes (Signed)
   Shirley Bush     MRN: [id number]      DOB: August 08, 1955   HPI Shirley Bush is here for follow up and re-evaluation of chronic medical conditions, medication management and review of any available recent lab and radiology data.  Preventive health is updated, specifically  Cancer screening and Immunization.   Since stopping hair , skin and nail supplement and changing her yogurt , she has noted hair loss in patches and has itchy scalp   ROS Denies recent fever or chills. Denies sinus pressure, nasal congestion, ear pain or sore throat. Denies chest congestion, productive cough or wheezing. Denies chest pains, palpitations and leg swelling Denies abdominal pain, nausea, vomiting,diarrhea or constipation.   Denies dysuria, frequency, hesitancy or incontinence. Denies joint pain, swelling and limitation in mobility. Denies headaches, seizures, numbness, or tingling. Denies depression, c/o anxiety denies  insomnia.    PE  BP (!) 160/96   Pulse 79   Temp 98.6 F (37 C) (Temporal)   Resp 15   Ht 5\' 3"  (1.6 m)   Wt 163 lb (73.9 kg)   SpO2 98%   BMI 28.87 kg/m   Patient alert and oriented and in no cardiopulmonary distress.  HEENT: No facial asymmetry, EOMI,     Neck supple .  Chest: Clear to auscultation bilaterally.  CVS: S1, S2 no murmurs, no S3.Regular rate.  ABD: Soft non tender.   Ext: No edema  MS: Adequate ROM spine, shoulders, hips and knees.  Skin: Intact, hyperpigmented scaling rash on scalp with annular alopecia, and hyperpigmented nodules in areas of trauma from scratching Ridged nails  Psych: Good eye contact, normal affect. Memory intact not anxious or depressed appearing.  CNS: CN 2-12 intact, power,  normal throughout.no focal deficits noted.   Assessment & Plan Essential hypertension Uncontrolled due to non compliance  Start amlodipine DASH diet and commitment to daily physical activity for a minimum of 30 minutes discussed and encouraged, as a part  of hypertension management. The importance of attaining a healthy weight is also discussed.  BP/Weight 07/16/2019 05/13/2019 04/08/2019 03/05/2019 01/14/2019 10/22/2018 Q000111Q  Systolic BP 0000000 Q000111Q 0000000 [id number] A999333 0000000 123456  Diastolic BP 96 88 84 92 87 90 96  Wt. (Lbs) 163 160 163 164.2 158 160 166  BMI 28.87 28.34 28.87 29.09 27.99 28.34 29.41  Some encounter information is confidential and restricted. Go to Review Flowsheets activity to see all data.       Hyperlipidemia Hyperlipidemia:Low fat diet discussed and encouraged.   Lipid Panel  Lab Results  Component Value Date   CHOL 257 (H) 04/09/2019   HDL 47 (L) 04/09/2019   LDLCALC 181 (H) 04/09/2019   TRIG 150 (H) 04/09/2019   CHOLHDL 5.5 (H) 04/09/2019  start 3 times  Weekly crestor     Tinea capitis Terbinafine prescribed, also referred to dermatology re allopecia  Alopecia Patchy alopecia , worsening in past 6 weeks, refer to Dermatology  Ridged nails Unexplained ridging of nails in past 12 to 18 months, refer dermatology

## 2019-07-17 ENCOUNTER — Other Ambulatory Visit: Payer: Self-pay

## 2019-07-17 LAB — COMPLETE METABOLIC PANEL WITH GFR
AG Ratio: 1.4 (calc) (ref 1.0–2.5)
ALT: 21 U/L (ref 6–29)
AST: 25 U/L (ref 10–35)
Albumin: 5 g/dL (ref 3.6–5.1)
Alkaline phosphatase (APISO): 73 U/L (ref 37–153)
BUN: 14 mg/dL (ref 7–25)
CO2: 24 mmol/L (ref 20–32)
Calcium: 10.2 mg/dL (ref 8.6–10.4)
Chloride: 101 mmol/L (ref 98–110)
Creat: 0.86 mg/dL (ref 0.50–0.99)
GFR, Est African American: 85 mL/min/{1.73_m2} (ref >=60)
GFR, Est Non African American: 73 mL/min/{1.73_m2} (ref >=60)
Globulin: 3.5 g/dL (calc) (ref 1.9–3.7)
Glucose, Bld: 93 mg/dL (ref 65–139)
Potassium: 4.4 mmol/L (ref 3.5–5.3)
Sodium: 137 mmol/L (ref 135–146)
Total Bilirubin: 0.3 mg/dL (ref 0.2–1.2)
Total Protein: 8.5 g/dL — ABNORMAL HIGH (ref 6.1–8.1)

## 2019-07-17 LAB — TSH: TSH: 3.83 mIU/L (ref 0.40–4.50)

## 2019-07-17 NOTE — Patient Outreach (Signed)
Shirley Bush [facility]) Care Management  07/17/2019  Shirley Bush Sep 13, 1955 [id number]   Medication Adherence call to [name] Telephone call to Patient regarding Medication Adherence unable to reach patient. [name] is showing past due on Rosuvastatin 10 mg under [place].   [place] Management Direct Dial [phone]  Fax [phone] [email or web address]

## 2019-07-18 ENCOUNTER — Encounter: Payer: Self-pay | Admitting: Family Medicine

## 2019-07-18 DIAGNOSIS — B350 Tinea barbae and tinea capitis: Secondary | ICD-10-CM | POA: Insufficient documentation

## 2019-07-18 DIAGNOSIS — L609 Nail disorder, unspecified: Secondary | ICD-10-CM | POA: Insufficient documentation

## 2019-07-18 DIAGNOSIS — L659 Nonscarring hair loss, unspecified: Secondary | ICD-10-CM | POA: Insufficient documentation

## 2019-07-18 NOTE — Assessment & Plan Note (Signed)
Patchy alopecia , worsening in past 6 weeks, refer to Dermatology

## 2019-07-18 NOTE — Assessment & Plan Note (Signed)
Unexplained ridging of nails in past 12 to 18 months, refer dermatology

## 2019-07-18 NOTE — Assessment & Plan Note (Signed)
Uncontrolled due to non compliance  Start amlodipine DASH diet and commitment to daily physical activity for a minimum of 30 minutes discussed and encouraged, as a part of hypertension management. The importance of attaining a healthy weight is also discussed.  BP/Weight 07/16/2019 05/13/2019 04/08/2019 03/05/2019 01/14/2019 10/22/2018 Q000111Q  Systolic BP 0000000 Q000111Q 0000000 A999333 A999333 0000000 123456  Diastolic BP 96 88 84 92 87 90 96  Wt. (Lbs) 163 160 163 164.2 158 160 166  BMI 28.87 28.34 28.87 29.09 27.99 28.34 29.41  Some encounter information is confidential and restricted. Go to Review Flowsheets activity to see all data.

## 2019-07-18 NOTE — Assessment & Plan Note (Signed)
Hyperlipidemia:Low fat diet discussed and encouraged.   Lipid Panel  Lab Results  Component Value Date   CHOL 257 (H) 04/09/2019   HDL 47 (L) 04/09/2019   LDLCALC 181 (H) 04/09/2019   TRIG 150 (H) 04/09/2019   CHOLHDL 5.5 (H) 04/09/2019  start 3 times  Weekly crestor

## 2019-07-18 NOTE — Assessment & Plan Note (Signed)
Terbinafine prescribed, also referred to dermatology re allopecia

## 2019-07-23 ENCOUNTER — Other Ambulatory Visit: Payer: Self-pay

## 2019-07-23 NOTE — Patient Outreach (Signed)
[facility]) Care Management  07/23/2019  TANAE ROMER 10/07/1955 [id number]   Medication Adherence call to Mrs. Rainelle Compliant Voice message left with a call back number. Mrs. Dipirro is showing past deu on Rosuvastatin 10 mg under [place].   [place] Management Direct Dial [phone]  Fax [phone] [email or web address]

## 2019-07-24 ENCOUNTER — Encounter: Payer: Self-pay | Admitting: Family Medicine

## 2019-07-24 ENCOUNTER — Other Ambulatory Visit: Payer: Self-pay

## 2019-07-24 ENCOUNTER — Ambulatory Visit (INDEPENDENT_AMBULATORY_CARE_PROVIDER_SITE_OTHER): Payer: Medicare Other | Admitting: Family Medicine

## 2019-07-24 VITALS — BP 160/96 | HR 79 | Resp 15 | Ht 63.0 in | Wt 163.0 lb

## 2019-07-24 DIAGNOSIS — Z0000 Encounter for general adult medical examination without abnormal findings: Secondary | ICD-10-CM

## 2019-07-24 NOTE — Progress Notes (Signed)
Subjective:   Shirley Bush is a 64 y.o. female who presents for Medicare Annual (Subsequent) preventive examination.  Location of Patient: Home Location of Provider: Telehealth Consent was obtain for visit to be over via telehealth.  I verified that I am speaking with the correct person using two identifiers.   Review of Systems:    Cardiac Risk Factors include: dyslipidemia;hypertension     Objective:     Vitals: BP (!) 160/96   Pulse 79   Resp 15   Ht 5\' 3"  (1.6 m)   Wt 163 lb (73.9 kg)   BMI 28.87 kg/m   Body mass index is 28.87 kg/m.  Advanced Directives 01/14/2019 07/22/2018 11/11/2014 10/30/2014 03/06/2014 04/17/2013  Does Patient Have a Medical Advance Directive? No No No No No Patient does not have advance directive;Patient would like information  Would patient like information on creating a medical advance directive? No - Patient declined Yes (ED - Information included in AVS) Yes - Educational materials given Yes - Educational materials given No - patient declined information Advance directive packet given  Some encounter information is confidential and restricted. Go to Review Flowsheets activity to see all data.    Tobacco Social History   Tobacco Use  Smoking Status Never Smoker  Smokeless Tobacco Never Used     Counseling given: Yes   Clinical Intake:  Pre-visit preparation completed: Yes  Pain : No/denies pain Pain Score: 0-No pain     BMI - recorded: 28.87 Nutritional Status: BMI 25 -29 Overweight Nutritional Risks: None Diabetes: No  How often do you need to have someone help you when you read instructions, pamphlets, or other written materials from your doctor or pharmacy?: 1 - Never What is the last grade level you completed in school?: 12  Interpreter Needed?: No     Past Medical History:  Diagnosis Date  . Anxiety   . Depression 2002  . Fibromyalgia   . Hypertension 2004  . Medial meniscus tear 04/2013   left  . Migraines      Past Surgical History:  Procedure Laterality Date  . [place]  . CESAREAN SECTION  1980  . CESAREAN SECTION  1992  . COLONOSCOPY  02/05/2009  . COLONOSCOPY N/A 03/06/2014   Procedure: COLONOSCOPY;  Surgeon: Danie Binder, MD;  [facility];  Service: Endoscopy;  Laterality: N/A;  10:30 AM  . COLONOSCOPY WITH PROPOFOL N/A 01/14/2019   Procedure: COLONOSCOPY WITH PROPOFOL;  Surgeon: Danie Binder, MD;  [facility];  Service: Endoscopy;  Laterality: N/A;  2:00pm  . FINGER SURGERY Right    long finger  . KNEE ARTHROSCOPY WITH MEDIAL MENISECTOMY Left 04/24/2013   Procedure: LEFT KNEE ARTHROSCOPY CHONDROPLASTY WITH MEDIAL MENISECTOMY;  Surgeon: Ninetta Lights, MD;  Location: [facility];  Service: Orthopedics;  Laterality: Left;  . SHOULDER ARTHROSCOPY Left 2009  . SHOULDER ARTHROSCOPY Right   . SHOULDER ARTHROSCOPY WITH ROTATOR CUFF REPAIR Left 11/25/2004  . TOTAL KNEE ARTHROPLASTY Left 11/11/2014   Procedure: LEFT TOTAL KNEE ARTHROPLASTY;  Surgeon: Kathryne Hitch, MD;  Location: [facility];  Service: Orthopedics;  Laterality: Left;  . TUBAL LIGATION  1992  . UMBILICAL HERNIA REPAIR  2008   Family History  Problem Relation Age of Onset  . Hypertension Mother   . Hypertension Father   . Alcohol abuse Father   . Seizures Father   . Migraines Sister   . Thyroid disease Sister   . Colon  cancer Sister        diagnosed in late 58s.   . Thyroid disease Sister   . Cancer Sister        breast   . Cancer Maternal Aunt        gyne cancer   Social History   Socioeconomic History  . Marital status: Divorced    Spouse name: Not on file  . Number of children: 3  . Years of education: 12 grade   . Highest education level: 12th grade  Occupational History  . Occupation: disability   Tobacco Use  . Smoking status: Never Smoker  . Smokeless tobacco: Never Used  Substance and Sexual Activity  . Alcohol use: Yes    Comment: occasionally  .  Drug use: No  . Sexual activity: Not Currently  Other Topics Concern  . Not on file  Social History Narrative   Son is living with her at the moment, patient states that she is isolating in her home and is depressed more lately    Social Determinants of Health   Financial Resource Strain:   . Difficulty of Paying Living Expenses: Not on file  Food Insecurity:   . Worried About Charity fundraiser in the Last Year: Not on file  . Ran Out of Food in the Last Year: Not on file  Transportation Needs:   . Lack of Transportation (Medical): Not on file  . Lack of Transportation (Non-Medical): Not on file  Physical Activity:   . Days of Exercise per Week: Not on file  . Minutes of Exercise per Session: Not on file  Stress:   . Feeling of Stress : Not on file  Social Connections:   . Frequency of Communication with Friends and Family: Not on file  . Frequency of Social Gatherings with Friends and Family: Not on file  . Attends Religious Services: Not on file  . Active Member of Clubs or Organizations: Not on file  . Attends Archivist Meetings: Not on file  . Marital Status: Not on file    Outpatient Encounter Medications as of 07/24/2019  Medication Sig  . amLODipine (NORVASC) 5 MG tablet Take 1 tablet (5 mg total) by mouth daily.  Marland Kitchen aspirin-acetaminophen-caffeine (EXCEDRIN MIGRAINE) 250-250-65 MG tablet Take by mouth every 6 (six) hours as needed for headache.  . Magnesium Citrate 200 MG TABS Take 1 tablet by mouth daily.  . Multiple Vitamins-Minerals (HAIR SKIN AND NAILS FORMULA) TABS Take 3 tablets by mouth daily.  . Simethicone (GAS-X PO) Take by mouth as needed.  . terbinafine (LAMISIL) 250 MG tablet Take 1 tablet (250 mg total) by mouth daily.   No facility-administered encounter medications on file as of 07/24/2019.    Activities of Daily Living In your present state of health, do you have any difficulty performing the following activities: 07/24/2019  Hearing? N    Vision? N  Difficulty concentrating or making decisions? N  Walking or climbing stairs? N  Dressing or bathing? N  Doing errands, shopping? N  Preparing Food and eating ? N  Using the Toilet? N  In the past six months, have you accidently leaked urine? N  Do you have problems with loss of bowel control? N  Managing your Medications? N  Managing your Finances? N  Housekeeping or managing your Housekeeping? N  Some recent data might be hidden    Patient Care Team: Fayrene Helper, MD as PCP - General Norman Clay, MD as Consulting Physician (  Psychiatry) Danie Binder, MD as Consulting Physician (Gastroenterology)    Assessment:   This is a routine wellness examination for [place].  Exercise Activities and Dietary recommendations Current Exercise Habits: The patient does not participate in regular exercise at present, Exercise limited by: None identified  Goals    . Increase physical activity       Fall Risk Fall Risk  07/24/2019 07/16/2019 05/13/2019 04/08/2019 10/22/2018  Falls in the past year? 0 0 0 0 0  Number falls in past yr: 0 0 0 0 0  Injury with Fall? 0 0 0 0 0   Is the patient's home free of loose throw rugs in walkways, pet beds, electrical cords, etc?   yes      Grab bars in the bathroom? yes      Handrails on the stairs?   yes      Adequate lighting?   yes     Depression Screen PHQ 2/9 Scores 07/24/2019 07/16/2019 05/13/2019 05/13/2019  PHQ - 2 Score 0 5 1 6   PHQ- 9 Score - 10 - 10     Cognitive Function     6CIT Screen 07/24/2019 07/22/2018  What Year? 0 points 0 points  What month? 0 points 0 points  What time? 0 points 0 points  Count back from 20 0 points 0 points  Months in reverse 0 points 0 points  Repeat phrase 0 points 4 points  Total Score 0 4    Immunization History  Administered Date(s) Administered  . Influenza,inj,Quad PF,6+ Mos 05/06/2014, 10/05/2015, 04/27/2016, 03/11/2018, 04/28/2019  . Td 05/17/2000  . Tdap 02/10/2011     Qualifies for Shingles Vaccine? Postponed unsure if she wants it  Screening Tests Health Maintenance  Topic Date Due  . TETANUS/TDAP  02/09/2021  . MAMMOGRAM  06/19/2021  . PAP SMEAR-Modifier  04/07/2022  . COLONOSCOPY  01/13/2029  . INFLUENZA VACCINE  Completed  . Hepatitis C Screening  Completed  . HIV Screening  Completed    Cancer Screenings: Lung: Low Dose CT Chest recommended if Age 76-80 years, 30 pack-year currently smoking OR have quit w/in 15years. Patient does not qualify. Breast:  Up to date on Mammogram? Yes   Up to date of Bone Density/Dexa? No Colorectal: up to date   Additional Screenings:   Hepatitis C Screening: completed     Plan:      1. Encounter for Medicare annual wellness exam   I have personally reviewed and noted the following in the patient's chart:   . Medical and social history . Use of alcohol, tobacco or illicit drugs  . Current medications and supplements . Functional ability and status . Nutritional status . Physical activity . Advanced directives . List of other physicians . Hospitalizations, surgeries, and ER visits in previous 12 months . Vitals . Screenings to include cognitive, depression, and falls . Referrals and appointments  In addition, I have reviewed and discussed with patient certain preventive protocols, quality metrics, and best practice recommendations. A written personalized care plan for preventive services as well as general preventive health recommendations were provided to patient.    I provided 20 minutes of non-face-to-face time during this encounter.   Perlie Mayo, NP  07/24/2019

## 2019-07-24 NOTE — Patient Instructions (Addendum)
Shirley Bush , Thank you for taking time to come for your Medicare Wellness Visit. I appreciate your ongoing commitment to your health goals. Please review the following plan we discussed and let me know if I can assist you in the future.   Please continue to practice social distancing to keep you, your family, and our community safe.  If you must go out, please wear a Mask and practice good handwashing.  Screening recommendations/referrals: Colonoscopy: completed-up to date Mammogram: up todate Bone Density: Due at 64 years old  Recommended yearly ophthalmology/optometry visit for glaucoma screening and checkup Recommended yearly dental visit for hygiene and checkup  Vaccinations: Influenza vaccine: completed Pneumococcal vaccine: Please discuss with Dr Moshe Cipro  Tdap vaccine: up to date Shingles vaccine: consider getting this, check with insurance on coverage  Advanced directives: will mail this out   Conditions/risks identified: Falls   Next appointment: 10/09/2019   Preventive Care 40-64 Years, Female Preventive care refers to lifestyle choices and visits with your health care provider that can promote health and wellness. What does preventive care include?  A yearly physical exam. This is also called an annual well check.  Dental exams once or twice a year.  Routine eye exams. Ask your health care provider how often you should have your eyes checked.  Personal lifestyle choices, including:  Daily care of your teeth and gums.  Regular physical activity.  Eating a healthy diet.  Avoiding tobacco and drug use.  Limiting alcohol use.  Practicing safe sex.  Taking low-dose aspirin daily starting at age 64.  Taking vitamin and mineral supplements as recommended by your health care provider. What happens during an annual well check? The services and screenings done by your health care provider during your annual well check will depend on your age, overall health,  lifestyle risk factors, and family history of disease. Counseling  Your health care provider may ask you questions about your:  Alcohol use.  Tobacco use.  Drug use.  Emotional well-being.  Home and relationship well-being.  Sexual activity.  Eating habits.  Work and work Statistician.  Method of birth control.  Menstrual cycle.  Pregnancy history. Screening  You may have the following tests or measurements:  Height, weight, and BMI.  Blood pressure.  Lipid and cholesterol levels. These may be checked every 5 years, or more frequently if you are over 48 years old.  Skin check.  Lung cancer screening. You may have this screening every year starting at age 45 if you have a 30-pack-year history of smoking and currently smoke or have quit within the past 15 years.  Fecal occult blood test (FOBT) of the stool. You may have this test every year starting at age 23.  Flexible sigmoidoscopy or colonoscopy. You may have a sigmoidoscopy every 5 years or a colonoscopy every 10 years starting at age 58.  Hepatitis C blood test.  Hepatitis B blood test.  Sexually transmitted disease (STD) testing.  Diabetes screening. This is done by checking your blood sugar (glucose) after you have not eaten for a while (fasting). You may have this done every 1-3 years.  Mammogram. This may be done every 1-2 years. Talk to your health care provider about when you should start having regular mammograms. This may depend on whether you have a family history of breast cancer.  BRCA-related cancer screening. This may be done if you have a family history of breast, ovarian, tubal, or peritoneal cancers.  Pelvic exam and Pap test. This may  be done every 3 years starting at age 17. Starting at age 66, this may be done every 5 years if you have a Pap test in combination with an HPV test.  Bone density scan. This is done to screen for osteoporosis. You may have this scan if you are at high risk for  osteoporosis. Discuss your test results, treatment options, and if necessary, the need for more tests with your health care provider. Vaccines  Your health care provider may recommend certain vaccines, such as:  Influenza vaccine. This is recommended every year.  Tetanus, diphtheria, and acellular pertussis (Tdap, Td) vaccine. You may need a Td booster every 10 years.  Zoster vaccine. You may need this after age 51.  Pneumococcal 13-valent conjugate (PCV13) vaccine. You may need this if you have certain conditions and were not previously vaccinated.  Pneumococcal polysaccharide (PPSV23) vaccine. You may need one or two doses if you smoke cigarettes or if you have certain conditions. Talk to your health care provider about which screenings and vaccines you need and how often you need them. This information is not intended to replace advice given to you by your health care provider. Make sure you discuss any questions you have with your health care provider. Document Released: 07/30/2015 Document Revised: 03/22/2016 Document Reviewed: 05/04/2015 Elsevier Interactive Patient Education  2017 [employer] [place] Prevention in the Home Falls can cause injuries. They can happen to people of all ages. There are many things you can do to make your home safe and to help prevent falls. What can I do on the outside of my home?  Regularly fix the edges of walkways and driveways and fix any cracks.  Remove anything that might make you trip as you walk through a door, such as a raised step or threshold.  Trim any bushes or trees on the path to your home.  Use bright outdoor lighting.  Clear any walking paths of anything that might make someone trip, such as rocks or tools.  Regularly check to see if handrails are loose or broken. Make sure that both sides of any steps have handrails.  Any raised decks and porches should have guardrails on the edges.  Have any leaves, snow, or ice cleared  regularly.  Use sand or salt on walking paths during winter.  Clean up any spills in your garage right away. This includes oil or grease spills. What can I do in the bathroom?  Use night lights.  Install grab bars by the toilet and in the tub and shower. Do not use towel bars as grab bars.  Use non-skid mats or decals in the tub or shower.  If you need to sit down in the shower, use a plastic, non-slip stool.  Keep the floor dry. Clean up any water that spills on the floor as soon as it happens.  Remove soap buildup in the tub or shower regularly.  Attach bath mats securely with double-sided non-slip rug tape.  Do not have throw rugs and other things on the floor that can make you trip. What can I do in the bedroom?  Use night lights.  Make sure that you have a light by your bed that is easy to reach.  Do not use any sheets or blankets that are too big for your bed. They should not hang down onto the floor.  Have a firm chair that has side arms. You can use this for support while you get dressed.  Do  not have throw rugs and other things on the floor that can make you trip. What can I do in the kitchen?  Clean up any spills right away.  Avoid walking on wet floors.  Keep items that you use a lot in easy-to-reach places.  If you need to reach something above you, use a strong step stool that has a grab bar.  Keep electrical cords out of the way.  Do not use floor polish or wax that makes floors slippery. If you must use wax, use non-skid floor wax.  Do not have throw rugs and other things on the floor that can make you trip. What can I do with my stairs?  Do not leave any items on the stairs.  Make sure that there are handrails on both sides of the stairs and use them. Fix handrails that are broken or loose. Make sure that handrails are as long as the stairways.  Check any carpeting to make sure that it is firmly attached to the stairs. Fix any carpet that is loose  or worn.  Avoid having throw rugs at the top or bottom of the stairs. If you do have throw rugs, attach them to the floor with carpet tape.  Make sure that you have a light switch at the top of the stairs and the bottom of the stairs. If you do not have them, ask someone to add them for you. What else can I do to help prevent falls?  Wear shoes that:  Do not have high heels.  Have rubber bottoms.  Are comfortable and fit you well.  Are closed at the toe. Do not wear sandals.  If you use a stepladder:  Make sure that it is fully opened. Do not climb a closed stepladder.  Make sure that both sides of the stepladder are locked into place.  Ask someone to hold it for you, if possible.  Clearly mark and make sure that you can see:  Any grab bars or handrails.  First and last steps.  Where the edge of each step is.  Use tools that help you move around (mobility aids) if they are needed. These include:  Canes.  Walkers.  Scooters.  Crutches.  Turn on the lights when you go into a dark area. Replace any light bulbs as soon as they burn out.  Set up your furniture so you have a clear path. Avoid moving your furniture around.  If any of your floors are uneven, fix them.  If there are any pets around you, be aware of where they are.  Review your medicines with your doctor. Some medicines can make you feel dizzy. This can increase your chance of falling. Ask your doctor what other things that you can do to help prevent falls. This information is not intended to replace advice given to you by your health care provider. Make sure you discuss any questions you have with your health care provider. Document Released: 04/29/2009 Document Revised: 12/09/2015 Document Reviewed: 08/07/2014 Elsevier Interactive Patient Education  2017 [employer].

## 2019-07-25 ENCOUNTER — Ambulatory Visit: Payer: Self-pay

## 2019-10-03 DIAGNOSIS — Z13220 Encounter for screening for lipoid disorders: Secondary | ICD-10-CM | POA: Diagnosis not present

## 2019-10-03 DIAGNOSIS — E785 Hyperlipidemia, unspecified: Secondary | ICD-10-CM | POA: Diagnosis not present

## 2019-10-04 LAB — LIPID PANEL
Cholesterol: 224 mg/dL — ABNORMAL HIGH (ref <200)
HDL: 51 mg/dL (ref >=50)
LDL Cholesterol (Calc): 148 mg/dL (calc) — ABNORMAL HIGH
Non-HDL Cholesterol (Calc): 173 mg/dL (calc) — ABNORMAL HIGH (ref <130)
Total CHOL/HDL Ratio: 4.4 (calc) (ref <5.0)
Triglycerides: 123 mg/dL (ref <150)

## 2019-10-04 LAB — HEPATIC FUNCTION PANEL
AG Ratio: 1.7 (calc) (ref 1.0–2.5)
ALT: 14 U/L (ref 6–29)
AST: 19 U/L (ref 10–35)
Albumin: 4.6 g/dL (ref 3.6–5.1)
Alkaline phosphatase (APISO): 76 U/L (ref 37–153)
Bilirubin, Direct: 0.1 mg/dL (ref 0.0–0.2)
Globulin: 2.7 g/dL (calc) (ref 1.9–3.7)
Indirect Bilirubin: 0.3 mg/dL (calc) (ref 0.2–1.2)
Total Bilirubin: 0.4 mg/dL (ref 0.2–1.2)
Total Protein: 7.3 g/dL (ref 6.1–8.1)

## 2019-10-06 ENCOUNTER — Telehealth: Payer: Self-pay | Admitting: Emergency Medicine

## 2019-10-06 ENCOUNTER — Other Ambulatory Visit: Payer: Self-pay

## 2019-10-06 ENCOUNTER — Encounter: Payer: Self-pay | Admitting: Family Medicine

## 2019-10-06 ENCOUNTER — Ambulatory Visit (INDEPENDENT_AMBULATORY_CARE_PROVIDER_SITE_OTHER): Payer: Medicare Other | Admitting: Family Medicine

## 2019-10-06 VITALS — BP 179/98 | HR 99 | Temp 97.6°F | Ht 63.0 in | Wt 164.8 lb

## 2019-10-06 DIAGNOSIS — I10 Essential (primary) hypertension: Secondary | ICD-10-CM

## 2019-10-06 DIAGNOSIS — L659 Nonscarring hair loss, unspecified: Secondary | ICD-10-CM | POA: Diagnosis not present

## 2019-10-06 MED ORDER — AMLODIPINE BESYLATE 10 MG PO TABS: 10.0000 mg | ORAL_TABLET | Freq: Every day | ORAL | 1 refills | Status: DC

## 2019-10-06 NOTE — Telephone Encounter (Signed)
Patient was seen today in our office never heard form Dermatology about referral that was made in Dec. Patient have been scheduled for Tues October 14, 2019 @12 :00 with Dr Nevada Crane. Patient was notified with the appt time and date

## 2019-10-06 NOTE — Patient Instructions (Addendum)
[facility] Address: [address], [place], [place] [address] Phone: [phone]    If you have lab work done today you will be contacted with your lab results within the next 2 weeks.  If you have not heard from [place] then please contact us. The fastest way to get your results is to register for My Chart.   IF you received an x-ray today, you will receive an invoice from [facility]. Please contact [facility] at [phone] with questions or concerns regarding your invoice.   IF you received labwork today, you will receive an invoice from [place]. Please contact [facility] at [phone] with questions or concerns regarding your invoice.   Our billing staff will not be able to assist you with questions regarding bills from these companies.  You will be contacted with the lab results as soon as they are available. The fastest way to get your results is to activate your My Chart account. Instructions are located on the last page of this paperwork. If you have not heard from [place] regarding the results in 2 weeks, please contact this office.      Managing Your Hypertension Hypertension is commonly called high blood pressure. This is when the force of your blood pressing against the walls of your arteries is too strong. Arteries are blood vessels that carry blood from your heart throughout your body. Hypertension forces the heart to work harder to pump blood, and may cause the arteries to become narrow or stiff. Having untreated or uncontrolled hypertension can cause heart attack, stroke, kidney disease, and other problems. What are blood pressure readings? A blood pressure reading consists of a higher number over a lower number. Ideally, your blood pressure should be below 120/80. The first ("top") number is called the systolic pressure. It is a measure of the pressure in your arteries as your heart beats. The second ("bottom") number is called the diastolic  pressure. It is a measure of the pressure in your arteries as the heart relaxes. What does my blood pressure reading mean? Blood pressure is classified into four stages. Based on your blood pressure reading, your health care provider may use the following stages to determine what type of treatment you need, if any. Systolic pressure and diastolic pressure are measured in a unit called mm Hg. Normal  Systolic pressure: below 123456.  Diastolic pressure: below 80. Elevated  Systolic pressure: [id number].  Diastolic pressure: below 80. Hypertension stage 1  Systolic pressure: 0000000.  Diastolic pressure: [id number]. Hypertension stage 2  Systolic pressure: [id number] or above.  Diastolic pressure: 90 or above. What health risks are associated with hypertension? Managing your hypertension is an important responsibility. Uncontrolled hypertension can lead to:  A heart attack.  A stroke.  A weakened blood vessel (aneurysm).  Heart failure.  Kidney damage.  Eye damage.  Metabolic syndrome.  Memory and concentration problems. What changes can I make to manage my hypertension? Hypertension can be managed by making lifestyle changes and possibly by taking medicines. Your health care provider will help you make a plan to bring your blood pressure within a normal range. Eating and drinking   Eat a diet that is high in fiber and potassium, and low in salt (sodium), added sugar, and fat. An example eating plan is called the DASH (Dietary Approaches to Stop Hypertension) diet. To eat this way: ? Eat plenty of fresh fruits and vegetables. Try to fill half of your plate at each meal with fruits and vegetables. ?  Eat whole grains, such as whole wheat pasta, brown rice, or whole grain bread. Fill about one quarter of your plate with whole grains. ? Eat low-fat diary products. ? Avoid fatty cuts of meat, processed or cured meats, and poultry with skin. Fill about one quarter of your plate with lean  proteins such as fish, chicken without skin, beans, eggs, and tofu. ? Avoid premade and processed foods. These tend to be higher in sodium, added sugar, and fat.  Reduce your daily sodium intake. Most people with hypertension should eat less than 1,500 mg of sodium a day.  Limit alcohol intake to no more than 1 drink a day for nonpregnant women and 2 drinks a day for men. One drink equals 12 oz of beer, 5 oz of wine, or 1 oz of hard liquor. Lifestyle  Work with your health care provider to maintain a healthy body weight, or to lose weight. Ask what an ideal weight is for you.  Get at least 30 minutes of exercise that causes your heart to beat faster (aerobic exercise) most days of the week. Activities may include walking, swimming, or biking.  Include exercise to strengthen your muscles (resistance exercise), such as weight lifting, as part of your weekly exercise routine. Try to do these types of exercises for 30 minutes at least 3 days a week.  Do not use any products that contain nicotine or tobacco, such as cigarettes and e-cigarettes. If you need help quitting, ask your health care provider.  Control any long-term (chronic) conditions you have, such as high cholesterol or diabetes. Monitoring  Monitor your blood pressure at home as told by your health care provider. Your personal target blood pressure may vary depending on your medical conditions, your age, and other factors.  Have your blood pressure checked regularly, as often as told by your health care provider. Working with your health care provider  Review all the medicines you take with your health care provider because there may be side effects or interactions.  Talk with your health care provider about your diet, exercise habits, and other lifestyle factors that may be contributing to hypertension.  Visit your health care provider regularly. Your health care provider can help you create and adjust your plan for managing  hypertension. Will I need medicine to control my blood pressure? Your health care provider may prescribe medicine if lifestyle changes are not enough to get your blood pressure under control, and if:  Your systolic blood pressure is 130 or higher.  Your diastolic blood pressure is 80 or higher. Take medicines only as told by your health care provider. Follow the directions carefully. Blood pressure medicines must be taken as prescribed. The medicine does not work as well when you skip doses. Skipping doses also puts you at risk for problems. Contact a health care provider if:  You think you are having a reaction to medicines you have taken.  You have repeated (recurrent) headaches.  You feel dizzy.  You have swelling in your ankles.  You have trouble with your vision. Get help right away if:  You develop a severe headache or confusion.  You have unusual weakness or numbness, or you feel faint.  You have severe pain in your chest or abdomen.  You vomit repeatedly.  You have trouble breathing. Summary  Hypertension is when the force of blood pumping through your arteries is too strong. If this condition is not controlled, it may put you at risk for serious complications.  Your  personal target blood pressure may vary depending on your medical conditions, your age, and other factors. For most people, a normal blood pressure is less than 120/80.  Hypertension is managed by lifestyle changes, medicines, or both. Lifestyle changes include weight loss, eating a healthy, low-sodium diet, exercising more, and limiting alcohol. This information is not intended to replace advice given to you by your health care provider. Make sure you discuss any questions you have with your health care provider. Document Revised: 10/25/2018 Document Reviewed: 05/31/2016 Elsevier Patient Education  [place].

## 2019-10-06 NOTE — Progress Notes (Signed)
Established Patient Office Visit  Subjective:  Patient ID: Shirley Bush, female    DOB: 1956-02-17  Age: 64 y.o. MRN: [id number]  CC:  Chief Complaint  Patient presents with  . Alopecia    f/u appt with Dr Moshe Cipro on hair loss. Supposed to see the Dermatology but have not heard back from their office with the appt. Was give med for the hair loss but I do not feel like it is working    HPI [employer] presents for HTN Hyperlipidemia-pt stopped Crestor in [employer].pt could not tolerate the crestor. HTN-amlodipine-bp at home 130-140/80. Pt states nervous about doctors office. Highest readings 160/90. No headaches or visual changes. COVID 09/21/2019-caused headache and arm pain.  Renal  Function normal, TSH normal. Pt with continued hair loss-pt taking vitamins from [employer]  Past Medical History:  Diagnosis Date  . Anxiety   . Depression 2002  . Fibromyalgia   . Hypertension 2004  . Medial meniscus tear 04/2013   left  . Migraines     Past Surgical History:  Procedure Laterality Date  . [place]  . CESAREAN SECTION  1980  . CESAREAN SECTION  1992  . COLONOSCOPY  02/05/2009  . COLONOSCOPY N/A 03/06/2014   Procedure: COLONOSCOPY;  Surgeon: Danie Binder, MD;  [facility];  Service: Endoscopy;  Laterality: N/A;  10:30 AM  . COLONOSCOPY WITH PROPOFOL N/A 01/14/2019   Procedure: COLONOSCOPY WITH PROPOFOL;  Surgeon: Danie Binder, MD;  [facility];  Service: Endoscopy;  Laterality: N/A;  2:00pm  . FINGER SURGERY Right    long finger  . KNEE ARTHROSCOPY WITH MEDIAL MENISECTOMY Left 04/24/2013   Procedure: LEFT KNEE ARTHROSCOPY CHONDROPLASTY WITH MEDIAL MENISECTOMY;  Surgeon: Ninetta Lights, MD;  Location: [facility];  Service: Orthopedics;  Laterality: Left;  . SHOULDER ARTHROSCOPY Left 2009  . SHOULDER ARTHROSCOPY Right   . SHOULDER ARTHROSCOPY WITH ROTATOR CUFF REPAIR Left 11/25/2004  . TOTAL KNEE ARTHROPLASTY  Left 11/11/2014   Procedure: LEFT TOTAL KNEE ARTHROPLASTY;  Surgeon: Kathryne Hitch, MD;  Location: [facility];  Service: Orthopedics;  Laterality: Left;  . TUBAL LIGATION  1992  . UMBILICAL HERNIA REPAIR  2008    Family History  Problem Relation Age of Onset  . Hypertension Mother   . Hypertension Father   . Alcohol abuse Father   . Seizures Father   . Migraines Sister   . Thyroid disease Sister   . Colon cancer Sister        diagnosed in late 81s.   . Thyroid disease Sister   . Cancer Sister        breast   . Cancer Maternal Aunt        gyne cancer    Social History   Socioeconomic History  . Marital status: Divorced    Spouse name: Not on file  . Number of children: 3  . Years of education: 12 grade   . Highest education level: 12th grade  Occupational History  . Occupation: disability   Tobacco Use  . Smoking status: Never Smoker  . Smokeless tobacco: Never Used  Substance and Sexual Activity  . Alcohol use: Yes    Comment: occasionally  . Drug use: No  . Sexual activity: Not Currently  Other Topics Concern  . Not on file  Social History Narrative   Son is living with her at the moment, patient states that she is isolating in her home  and is depressed more lately    Social Determinants of Health   Financial Resource Strain:   . Difficulty of Paying Living Expenses:   Food Insecurity:   . Worried About Charity fundraiser in the Last Year:   . Arboriculturist in the Last Year:   Transportation Needs:   . Film/video editor (Medical):   Marland Kitchen Lack of Transportation (Non-Medical):   Physical Activity:   . Days of Exercise per Week:   . Minutes of Exercise per Session:   Stress:   . Feeling of Stress :   Social Connections:   . Frequency of Communication with Friends and Family:   . Frequency of Social Gatherings with Friends and Family:   . Attends Religious Services:   . Active Member of Clubs or Organizations:   . Attends Archivist Meetings:    Marland Kitchen Marital Status:   Intimate Partner Violence:   . Fear of Current or Ex-Partner:   . Emotionally Abused:   Marland Kitchen Physically Abused:   . Sexually Abused:     Outpatient Medications Prior to Visit  Medication Sig Dispense Refill  . amLODipine (NORVASC) 5 MG tablet Take 1 tablet (5 mg total) by mouth daily. 30 tablet 2  . aspirin-acetaminophen-caffeine (EXCEDRIN MIGRAINE) [id number] MG tablet Take by mouth every 6 (six) hours as needed for headache.    . Magnesium Citrate 200 MG TABS Take 1 tablet by mouth daily.    . Multiple Vitamins-Minerals (HAIR SKIN AND NAILS FORMULA) TABS Take 3 tablets by mouth daily.    . Simethicone (GAS-X PO) Take by mouth as needed.    . terbinafine (LAMISIL) 250 MG tablet Take 1 tablet (250 mg total) by mouth daily. 42 tablet 1   No facility-administered medications prior to visit.    Allergies  Allergen Reactions  . Lisinopril Cough  . Maxzide [Hydrochlorothiazide W-Triamterene] Itching    Rash  . Buspar [Buspirone]     Rash    . Crestor [Rosuvastatin Calcium] Rash    ROS Review of Systems  Constitutional: Negative.   Respiratory: Negative.   Cardiovascular: Negative.   Skin: Positive for rash.       Hair thin spots and brittle-onset 04/2019  Allergic/Immunologic: Positive for environmental allergies.      Objective:    Physical Exam  Constitutional: She is oriented to person, place, and time. She appears well-developed and well-nourished.  Eyes: Conjunctivae are normal.  Cardiovascular: Normal rate and regular rhythm.  Pulmonary/Chest: Effort normal and breath sounds normal.  Neurological: She is oriented to person, place, and time.  Psychiatric: She has a normal mood and affect. Her behavior is normal.    BP (!) 179/98 (BP Location: Left Arm, Patient Position: Sitting, Cuff Size: Normal)   Pulse 99   Temp 97.6 F (36.4 C) (Temporal)   Ht 5\' 3"  (1.6 m)   Wt 164 lb 12.8 oz (74.8 kg)   SpO2 97%   BMI 29.19 kg/m  Wt Readings from  Last 3 Encounters:  10/06/19 164 lb 12.8 oz (74.8 kg)  07/24/19 163 lb (73.9 kg)  07/16/19 163 lb (73.9 kg)    Lab Results  Component Value Date   TSH 3.83 07/16/2019   Lab Results  Component Value Date   WBC 8.1 04/09/2019   HGB 14.3 04/09/2019   HCT 41.5 04/09/2019   MCV 87.4 04/09/2019   PLT 415 (H) 04/09/2019   Lab Results  Component Value Date   NA 137 07/16/2019  K 4.4 07/16/2019   CO2 24 07/16/2019   GLUCOSE 93 07/16/2019   BUN 14 07/16/2019   CREATININE 0.86 07/16/2019   BILITOT 0.4 10/03/2019   ALKPHOS 53 10/07/2015   AST 19 10/03/2019   ALT 14 10/03/2019   PROT 7.3 10/03/2019   ALBUMIN 4.2 10/07/2015   CALCIUM 10.2 07/16/2019   ANIONGAP 10 11/14/2014   Lab Results  Component Value Date   CHOL 224 (H) 10/03/2019   Lab Results  Component Value Date   HDL 51 10/03/2019   Lab Results  Component Value Date   LDLCALC 148 (H) 10/03/2019   Lab Results  Component Value Date   TRIG 123 10/03/2019   Lab Results  Component Value Date   CHOLHDL 4.4 10/03/2019   Lab Results  Component Value Date   HGBA1C 5.6 04/25/2016      Assessment & Plan:   1. Essential hypertension Amlodipine -increase dose to 10mg -rx Renal function normal Recheck in 1 month  2. Alopecia Pt does not feel improving Derm referral Follow-up: 1 month    Hannah Beat, MD

## 2019-10-09 ENCOUNTER — Ambulatory Visit: Payer: Medicare Other | Admitting: Family Medicine

## 2019-10-14 DIAGNOSIS — L603 Nail dystrophy: Secondary | ICD-10-CM | POA: Diagnosis not present

## 2019-10-14 DIAGNOSIS — L658 Other specified nonscarring hair loss: Secondary | ICD-10-CM | POA: Diagnosis not present

## 2019-10-31 ENCOUNTER — Other Ambulatory Visit: Payer: Self-pay | Admitting: Family Medicine

## 2019-11-06 ENCOUNTER — Encounter: Payer: Self-pay | Admitting: Family Medicine

## 2019-11-06 ENCOUNTER — Other Ambulatory Visit: Payer: Self-pay

## 2019-11-06 ENCOUNTER — Ambulatory Visit (INDEPENDENT_AMBULATORY_CARE_PROVIDER_SITE_OTHER): Payer: Medicare Other | Admitting: Family Medicine

## 2019-11-06 VITALS — BP 160/92 | HR 87 | Temp 97.8°F | Ht 63.0 in | Wt 162.2 lb

## 2019-11-06 DIAGNOSIS — I10 Essential (primary) hypertension: Secondary | ICD-10-CM | POA: Diagnosis not present

## 2019-11-06 LAB — BASIC METABOLIC PANEL
BUN: 12 mg/dL (ref 7–25)
CO2: 29 mmol/L (ref 20–32)
Calcium: 10.4 mg/dL (ref 8.6–10.4)
Chloride: 100 mmol/L (ref 98–110)
Creat: 0.8 mg/dL (ref 0.50–0.99)
Glucose, Bld: 106 mg/dL — ABNORMAL HIGH (ref 65–99)
Potassium: 4.5 mmol/L (ref 3.5–5.3)
Sodium: 138 mmol/L (ref 135–146)

## 2019-11-06 MED ORDER — METOPROLOL SUCCINATE ER 25 MG PO TB24: 25.0000 mg | ORAL_TABLET | Freq: Every day | ORAL | 1 refills | Status: DC

## 2019-11-06 NOTE — Progress Notes (Signed)
Established Patient Office Visit  Subjective:  Patient ID: Shirley Bush, female    DOB: 24-Oct-1955  Age: 64 y.o. MRN: [id number]  CC:  Chief Complaint  Patient presents with  . Hypertension    1 month f/u     HPI Shirley Bush presents for HTN-pt taking daily 10-11pm-forgot last night so took this morning. Pt states medication causes fatigue and headaches. Pt states previous medication cause cough, and itching. bp at home 179/100 at home before coming to the appointment  Past Medical History:  Diagnosis Date  . Anxiety   . Depression 2002  . Fibromyalgia   . Hypertension 2004  . Medial meniscus tear 04/2013   left  . Migraines     Past Surgical History:  Procedure Laterality Date  . [place]  . CESAREAN SECTION  1980  . CESAREAN SECTION  1992  . COLONOSCOPY  02/05/2009  . COLONOSCOPY N/A 03/06/2014   Procedure: COLONOSCOPY;  Surgeon: Danie Binder, MD;  [facility];  Service: Endoscopy;  Laterality: N/A;  10:30 AM  . COLONOSCOPY WITH PROPOFOL N/A 01/14/2019   Procedure: COLONOSCOPY WITH PROPOFOL;  Surgeon: Danie Binder, MD;  [facility];  Service: Endoscopy;  Laterality: N/A;  2:00pm  . FINGER SURGERY Right    long finger  . KNEE ARTHROSCOPY WITH MEDIAL MENISECTOMY Left 04/24/2013   Procedure: LEFT KNEE ARTHROSCOPY CHONDROPLASTY WITH MEDIAL MENISECTOMY;  Surgeon: Ninetta Lights, MD;  Location: [facility];  Service: Orthopedics;  Laterality: Left;  . SHOULDER ARTHROSCOPY Left 2009  . SHOULDER ARTHROSCOPY Right   . SHOULDER ARTHROSCOPY WITH ROTATOR CUFF REPAIR Left 11/25/2004  . TOTAL KNEE ARTHROPLASTY Left 11/11/2014   Procedure: LEFT TOTAL KNEE ARTHROPLASTY;  Surgeon: Kathryne Hitch, MD;  Location: [facility];  Service: Orthopedics;  Laterality: Left;  . TUBAL LIGATION  1992  . UMBILICAL HERNIA REPAIR  2008    Family History  Problem Relation Age of Onset  . Hypertension Mother   . Hypertension Father   .  Alcohol abuse Father   . Seizures Father   . Migraines Sister   . Thyroid disease Sister   . Colon cancer Sister        diagnosed in late 45s.   . Thyroid disease Sister   . Cancer Sister        breast   . Cancer Maternal Aunt        gyne cancer    Social History   Socioeconomic History  . Marital status: Divorced    Spouse name: Not on file  . Number of children: 3  . Years of education: 12 grade   . Highest education level: 12th grade  Occupational History  . Occupation: disability   Tobacco Use  . Smoking status: Never Smoker  . Smokeless tobacco: Never Used  Substance and Sexual Activity  . Alcohol use: Yes    Comment: occasionally  . Drug use: No  . Sexual activity: Not Currently  Other Topics Concern  . Not on file  Social History Narrative   Son is living with her at the moment, patient states that she is isolating in her home and is depressed more lately    Social Determinants of Health   Financial Resource Strain:   . Difficulty of Paying Living Expenses:   Food Insecurity:   . Worried About Charity fundraiser in the Last Year:   . [place] in the Last  Year:   Transportation Needs:   . Film/video editor (Medical):   Marland Kitchen Lack of Transportation (Non-Medical):   Physical Activity:   . Days of Exercise per Week:   . Minutes of Exercise per Session:   Stress:   . Feeling of Stress :   Social Connections:   . Frequency of Communication with Friends and Family:   . Frequency of Social Gatherings with Friends and Family:   . Attends Religious Services:   . Active Member of Clubs or Organizations:   . Attends Archivist Meetings:   Marland Kitchen Marital Status:   Intimate Partner Violence:   . Fear of Current or Ex-Partner:   . Emotionally Abused:   Marland Kitchen Physically Abused:   . Sexually Abused:     Outpatient Medications Prior to Visit  Medication Sig Dispense Refill  . amLODipine (NORVASC) 10 MG tablet Take 1 tablet (10 mg total) by mouth  daily. 90 tablet 1  . aspirin-acetaminophen-caffeine (EXCEDRIN MIGRAINE) [id number] MG tablet Take by mouth every 6 (six) hours as needed for headache.    . Magnesium Citrate 200 MG TABS Take 1 tablet by mouth daily.    . Multiple Vitamins-Minerals (HAIR SKIN AND NAILS FORMULA) TABS Take 3 tablets by mouth daily.    . Simethicone (GAS-X PO) Take by mouth as needed.     No facility-administered medications prior to visit.    Allergies  Allergen Reactions  . Lisinopril Cough  . Maxzide [Hydrochlorothiazide W-Triamterene] Itching    Rash  . Buspar [Buspirone]     Rash    . Crestor [Rosuvastatin Calcium] Rash    ROS Review of Systems  Constitutional: Negative.   Eyes: Negative.   Respiratory: Negative.   Cardiovascular: Negative.   Musculoskeletal: Positive for arthralgias.  Neurological: Negative for headaches.      Objective:    Physical Exam  Constitutional: She is oriented to person, place, and time. She appears well-developed and well-nourished.  HENT:  Head: Normocephalic and atraumatic.  Eyes: Conjunctivae are normal.  Cardiovascular: Normal rate, regular rhythm, normal heart sounds and intact distal pulses.  Pulmonary/Chest: Effort normal and breath sounds normal.  Neurological: She is alert and oriented to person, place, and time.  Psychiatric: She has a normal mood and affect. Her behavior is normal.    BP (!) 160/92 (BP Location: Left Arm, Patient Position: Sitting, Cuff Size: Normal)   Pulse 87   Temp 97.8 F (36.6 C) (Temporal)   Ht 5\' 3"  (1.6 m)   Wt 162 lb 3.2 oz (73.6 kg)   SpO2 98%   BMI 28.73 kg/m  Wt Readings from Last 3 Encounters:  11/06/19 162 lb 3.2 oz (73.6 kg)  10/06/19 164 lb 12.8 oz (74.8 kg)  07/24/19 163 lb (73.9 kg)      Lab Results  Component Value Date   TSH 3.83 07/16/2019   Lab Results  Component Value Date   WBC 8.1 04/09/2019   HGB 14.3 04/09/2019   HCT 41.5 04/09/2019   MCV 87.4 04/09/2019   PLT 415 (H) 04/09/2019    Lab Results  Component Value Date   NA 137 07/16/2019   K 4.4 07/16/2019   CO2 24 07/16/2019   GLUCOSE 93 07/16/2019   BUN 14 07/16/2019   CREATININE 0.86 07/16/2019   BILITOT 0.4 10/03/2019   ALKPHOS 53 10/07/2015   AST 19 10/03/2019   ALT 14 10/03/2019   PROT 7.3 10/03/2019   ALBUMIN 4.2 10/07/2015   CALCIUM 10.2 07/16/2019  ANIONGAP 10 11/14/2014   Lab Results  Component Value Date   CHOL 224 (H) 10/03/2019   Lab Results  Component Value Date   HDL 51 10/03/2019   Lab Results  Component Value Date   LDLCALC 148 (H) 10/03/2019   Lab Results  Component Value Date   TRIG 123 10/03/2019   Lab Results  Component Value Date   CHOLHDL 4.4 10/03/2019   Lab Results  Component Value Date   HGBA1C 5.6 04/25/2016      Assessment & Plan:  1. Essential hypertension Toprol XL 25mg  -rx-Take one a day Follow-up: 1 month-recheck bmp, check blood pressure at home   Chaniah Cisse Hannah Beat, MD

## 2019-11-06 NOTE — Patient Instructions (Addendum)
Start Toprol 25mg  -take at lunchtime bloodwork prior to return visit Take blood pressure at home  If you have lab work done today you will be contacted with your lab results within the next 2 weeks.  If you have not heard from [place] then please contact us. The fastest way to get your results is to register for My Chart.   IF you received an x-ray today, you will receive an invoice from [facility]. Please contact [facility] at [phone] with questions or concerns regarding your invoice.   IF you received labwork today, you will receive an invoice from [place]. Please contact [facility] at [phone] with questions or concerns regarding your invoice.   Our billing staff will not be able to assist you with questions regarding bills from these companies.  You will be contacted with the lab results as soon as they are available. The fastest way to get your results is to activate your My Chart account. Instructions are located on the last page of this paperwork. If you have not heard from [place] regarding the results in 2 weeks, please contact this office.

## 2019-11-06 NOTE — Addendum Note (Signed)
Addended by: Maryruth Hancock on: 11/06/2019 09:07 AM   Modules accepted: Orders

## 2019-12-09 ENCOUNTER — Ambulatory Visit (INDEPENDENT_AMBULATORY_CARE_PROVIDER_SITE_OTHER): Payer: Medicare Other | Admitting: Family Medicine

## 2019-12-09 ENCOUNTER — Encounter: Payer: Self-pay | Admitting: Family Medicine

## 2019-12-09 ENCOUNTER — Other Ambulatory Visit: Payer: Self-pay

## 2019-12-09 VITALS — BP 159/88 | HR 84 | Temp 98.0°F | Wt 161.2 lb

## 2019-12-09 DIAGNOSIS — F418 Other specified anxiety disorders: Secondary | ICD-10-CM | POA: Diagnosis not present

## 2019-12-09 DIAGNOSIS — I10 Essential (primary) hypertension: Secondary | ICD-10-CM

## 2019-12-09 MED ORDER — METOPROLOL SUCCINATE ER 50 MG PO TB24: 50.0000 mg | ORAL_TABLET | Freq: Every day | ORAL | 0 refills | Status: DC

## 2019-12-09 MED ORDER — CITALOPRAM HYDROBROMIDE 10 MG PO TABS: 10.0000 mg | ORAL_TABLET | Freq: Every day | ORAL | 0 refills | Status: DC

## 2019-12-09 NOTE — Progress Notes (Signed)
Established Patient Office Visit  Subjective:  Patient ID: Shirley Bush, female    DOB: [birth year]  Age: 64 y.o. MRN: [id number]  CC:  Chief Complaint  Patient presents with  . Hypertension    f/u    HPI Shirley Bush presents for HTN-headaches intermittently. Pt states increase stress-son and daughter living with pt along with their children. Pt states cat and dog came with daughter. Pt lives in a 1 bedroom apartment. Pt states increase anxiety and depression. Taking amlodipine and Toprol. Pt with intermittent headaches.   Past Medical History:  Diagnosis Date  . Anxiety   . Depression 2002  . Fibromyalgia   . Hypertension 2004  . Medial meniscus tear 04/2013   left  . Migraines     Past Surgical History:  Procedure Laterality Date  . [place]  . CESAREAN SECTION  1980  . CESAREAN SECTION  1992  . COLONOSCOPY  02/05/2009  . COLONOSCOPY N/A 03/06/2014   Procedure: COLONOSCOPY;  Surgeon: Danie Binder, MD;  [facility];  Service: Endoscopy;  Laterality: N/A;  10:30 AM  . COLONOSCOPY WITH PROPOFOL N/A 01/14/2019   Procedure: COLONOSCOPY WITH PROPOFOL;  Surgeon: Danie Binder, MD;  [facility];  Service: Endoscopy;  Laterality: N/A;  2:00pm  . FINGER SURGERY Right    long finger  . KNEE ARTHROSCOPY WITH MEDIAL MENISECTOMY Left 04/24/2013   Procedure: LEFT KNEE ARTHROSCOPY CHONDROPLASTY WITH MEDIAL MENISECTOMY;  Surgeon: Ninetta Lights, MD;  Location: [place];  Service: Orthopedics;  Laterality: Left;  . SHOULDER ARTHROSCOPY Left 2009  . SHOULDER ARTHROSCOPY Right   . SHOULDER ARTHROSCOPY WITH ROTATOR CUFF REPAIR Left 11/25/2004  . TOTAL KNEE ARTHROPLASTY Left 11/11/2014   Procedure: LEFT TOTAL KNEE ARTHROPLASTY;  Surgeon: Kathryne Hitch, MD;  Location: [place];  Service: Orthopedics;  Laterality: Left;  . TUBAL LIGATION  1992  . UMBILICAL HERNIA REPAIR  2008    Family History  Problem Relation Age of Onset  .  Hypertension Mother   . Hypertension Father   . Alcohol abuse Father   . Seizures Father   . Migraines Sister   . Thyroid disease Sister   . Colon cancer Sister        diagnosed in late 68s.   . Thyroid disease Sister   . Cancer Sister        breast   . Cancer Maternal Aunt        gyne cancer    Social History   Socioeconomic History  . Marital status: Divorced    Spouse name: Not on file  . Number of children: 3  . Years of education: 12 grade   . Highest education level: 12th grade  Occupational History  . Occupation: disability   Tobacco Use  . Smoking status: Never Smoker  . Smokeless tobacco: Never Used  Substance and Sexual Activity  . Alcohol use: Yes    Comment: occasionally  . Drug use: No  . Sexual activity: Not Currently  Other Topics Concern  . Not on file  Social History Narrative   Son is living with her at the moment, patient states that she is isolating in her home and is depressed more lately    Social Determinants of Health   Financial Resource Strain:   . Difficulty of Paying Living Expenses:   Food Insecurity:   . Worried About Charity fundraiser in the Last Year:   . Ran  Out of Food in the Last Year:   Transportation Needs:   . Lack of Transportation (Medical):   Marland Kitchen Lack of Transportation (Non-Medical):   Physical Activity:   . Days of Exercise per Week:   . Minutes of Exercise per Session:   Stress:   . Feeling of Stress :   Social Connections:   . Frequency of Communication with Friends and Family:   . Frequency of Social Gatherings with Friends and Family:   . Attends Religious Services:   . Active Member of Clubs or Organizations:   . Attends Archivist Meetings:   Marland Kitchen Marital Status:   Intimate Partner Violence:   . Fear of Current or Ex-Partner:   . Emotionally Abused:   Marland Kitchen Physically Abused:   . Sexually Abused:     Outpatient Medications Prior to Visit  Medication Sig Dispense Refill  . amLODipine (NORVASC) 10 MG  tablet Take 1 tablet (10 mg total) by mouth daily. 90 tablet 1  . aspirin-acetaminophen-caffeine (EXCEDRIN MIGRAINE) [id number] MG tablet Take by mouth every 6 (six) hours as needed for headache.    . Fluocinolone Acetonide Scalp 0.01 % OIL     . Magnesium Citrate 200 MG TABS Take 1 tablet by mouth daily.    . magnesium oxide (MAG-OX) 400 MG tablet Take 1 tablet by mouth daily.    . metoprolol succinate (TOPROL-XL) 25 MG 24 hr tablet Take 1 tablet (25 mg total) by mouth daily. 30 tablet 1  . Multiple Vitamins-Minerals (HAIR SKIN AND NAILS FORMULA) TABS Take 3 tablets by mouth daily.    . Simethicone (GAS-X PO) Take by mouth as needed.     No facility-administered medications prior to visit.    Allergies  Allergen Reactions  . Lisinopril Cough  . Maxzide [Hydrochlorothiazide W-Triamterene] Itching    Rash  . Buspar [Buspirone]     Rash    . Crestor [Rosuvastatin Calcium] Rash    ROS Review of Systems  Constitutional: Negative.   Respiratory: Negative.   Cardiovascular: Negative.   Gastrointestinal: Negative.   Allergic/Immunologic: Positive for environmental allergies.  Neurological: Positive for headaches. Negative for dizziness.  Psychiatric/Behavioral: The patient is nervous/anxious.       Objective:    Physical Exam  Constitutional: She is oriented to person, place, and time. She appears well-developed and well-nourished.  HENT:  Head: Normocephalic and atraumatic.  Cardiovascular: Normal rate, regular rhythm and normal heart sounds.  Pulmonary/Chest: Effort normal and breath sounds normal.  Musculoskeletal:     Cervical back: Normal range of motion.  Neurological: She is alert and oriented to person, place, and time.  Psychiatric: She has a normal mood and affect. Her behavior is normal.    BP (!) 159/88 (BP Location: Left Arm, Patient Position: Sitting)   Pulse 84   Temp 98 F (36.7 C) (Temporal)   Wt 161 lb 3.2 oz (73.1 kg)   SpO2 96%   BMI 28.56 kg/m   Wt Readings from Last 3 Encounters:  12/09/19 161 lb 3.2 oz (73.1 kg)  11/06/19 162 lb 3.2 oz (73.6 kg)  10/06/19 164 lb 12.8 oz (74.8 kg)    Lab Results  Component Value Date   TSH 3.83 07/16/2019   Lab Results  Component Value Date   WBC 8.1 04/09/2019   HGB 14.3 04/09/2019   HCT 41.5 04/09/2019   MCV 87.4 04/09/2019   PLT 415 (H) 04/09/2019   Lab Results  Component Value Date   NA 138 11/06/2019  K 4.5 11/06/2019   CO2 29 11/06/2019   GLUCOSE 106 (H) 11/06/2019   BUN 12 11/06/2019   CREATININE 0.80 11/06/2019   BILITOT 0.4 10/03/2019   ALKPHOS 53 10/07/2015   AST 19 10/03/2019   ALT 14 10/03/2019   PROT 7.3 10/03/2019   ALBUMIN 4.2 10/07/2015   CALCIUM 10.4 11/06/2019   ANIONGAP 10 11/14/2014   Lab Results  Component Value Date   CHOL 224 (H) 10/03/2019   Lab Results  Component Value Date   HDL 51 10/03/2019   Lab Results  Component Value Date   LDLCALC 148 (H) 10/03/2019   Lab Results  Component Value Date   TRIG 123 10/03/2019   Lab Results  Component Value Date   CHOLHDL 4.4 10/03/2019   Lab Results  Component Value Date   HGBA1C 5.6 04/25/2016      Assessment & Plan:  1. Essential hypertension Increase dose to 50mg  Toprol -XL, Norvasc daily   2. Situational anxiety celexa- 10mg -start-increase stress due to children in home GAD score13 Follow-up: primary care provider to monitor increase in Toprol, recommended exercise to decrease stress and started celexa 10mg -allergy to Buspar Renal function normal 4/21 Bronda Alfred Hannah Beat, MD

## 2019-12-09 NOTE — Patient Instructions (Addendum)
Increase dose of blood pressure medication-Increase dose of Toprol to 50mg  Monitor blood pressure at home-first thing in the morning Start taking celexa 10mg  every day Obtain primary care provider

## 2020-03-25 ENCOUNTER — Other Ambulatory Visit: Payer: Self-pay | Admitting: Family Medicine

## 2020-04-16 ENCOUNTER — Ambulatory Visit (INDEPENDENT_AMBULATORY_CARE_PROVIDER_SITE_OTHER): Payer: Medicare Other

## 2020-04-16 ENCOUNTER — Other Ambulatory Visit: Payer: Self-pay

## 2020-04-16 DIAGNOSIS — Z23 Encounter for immunization: Secondary | ICD-10-CM | POA: Diagnosis not present

## 2020-06-01 ENCOUNTER — Other Ambulatory Visit: Payer: Self-pay | Admitting: Obstetrics and Gynecology

## 2020-06-01 ENCOUNTER — Other Ambulatory Visit (HOSPITAL_COMMUNITY): Payer: Self-pay | Admitting: Obstetrics and Gynecology

## 2020-06-01 DIAGNOSIS — Z86018 Personal history of other benign neoplasm: Secondary | ICD-10-CM | POA: Diagnosis not present

## 2020-06-01 DIAGNOSIS — R102 Pelvic and perineal pain: Secondary | ICD-10-CM

## 2020-06-07 ENCOUNTER — Other Ambulatory Visit: Payer: Self-pay

## 2020-06-07 ENCOUNTER — Ambulatory Visit (HOSPITAL_COMMUNITY)
Admission: RE | Admit: 2020-06-07 | Discharge: 2020-06-07 | Disposition: A | Discharge status: Home / Self Care | Payer: Medicare Other | Source: Ambulatory Visit | Attending: Obstetrics and Gynecology | Admitting: Obstetrics and Gynecology

## 2020-06-07 DIAGNOSIS — R102 Pelvic and perineal pain: Secondary | ICD-10-CM | POA: Diagnosis present

## 2020-06-07 DIAGNOSIS — Z86018 Personal history of other benign neoplasm: Secondary | ICD-10-CM | POA: Insufficient documentation

## 2020-06-14 ENCOUNTER — Other Ambulatory Visit (HOSPITAL_COMMUNITY): Payer: Self-pay | Admitting: Family Medicine

## 2020-06-14 DIAGNOSIS — Z1231 Encounter for screening mammogram for malignant neoplasm of breast: Secondary | ICD-10-CM

## 2020-06-28 ENCOUNTER — Ambulatory Visit (HOSPITAL_COMMUNITY): Payer: Medicare Other

## 2020-06-30 ENCOUNTER — Ambulatory Visit (HOSPITAL_COMMUNITY): Payer: Medicare Other

## 2020-07-01 ENCOUNTER — Other Ambulatory Visit: Payer: Self-pay

## 2020-07-01 ENCOUNTER — Encounter: Payer: Self-pay | Admitting: Family Medicine

## 2020-07-01 ENCOUNTER — Other Ambulatory Visit (HOSPITAL_COMMUNITY)
Admission: RE | Admit: 2020-07-01 | Discharge: 2020-07-01 | Disposition: A | Discharge status: Home / Self Care | Payer: Medicare Other | Source: Ambulatory Visit | Attending: Family Medicine | Admitting: Family Medicine

## 2020-07-01 ENCOUNTER — Ambulatory Visit (INDEPENDENT_AMBULATORY_CARE_PROVIDER_SITE_OTHER): Payer: Medicare Other | Admitting: Family Medicine

## 2020-07-01 VITALS — BP 151/100 | HR 102 | Resp 16 | Ht 63.0 in | Wt 153.4 lb

## 2020-07-01 DIAGNOSIS — N898 Other specified noninflammatory disorders of vagina: Secondary | ICD-10-CM

## 2020-07-01 DIAGNOSIS — E785 Hyperlipidemia, unspecified: Secondary | ICD-10-CM

## 2020-07-01 DIAGNOSIS — R21 Rash and other nonspecific skin eruption: Secondary | ICD-10-CM

## 2020-07-01 DIAGNOSIS — Z0001 Encounter for general adult medical examination with abnormal findings: Secondary | ICD-10-CM

## 2020-07-01 DIAGNOSIS — R7989 Other specified abnormal findings of blood chemistry: Secondary | ICD-10-CM

## 2020-07-01 DIAGNOSIS — I10 Essential (primary) hypertension: Secondary | ICD-10-CM | POA: Diagnosis not present

## 2020-07-01 MED ORDER — NYSTATIN-TRIAMCINOLONE 100000-0.1 UNIT/GM-% EX OINT: TOPICAL_OINTMENT | CUTANEOUS | 0 refills | Status: DC

## 2020-07-01 MED ORDER — CLONIDINE HCL 0.2 MG PO TABS: 0.2000 mg | ORAL_TABLET | Freq: Two times a day (BID) | ORAL | 2 refills | Status: DC

## 2020-07-01 NOTE — Patient Instructions (Addendum)
F/u in office re evaluate blood pressure in 6 weeks, call if you need me sooner  New for blood pressure is clonidine one tablet at bedtime  Specimen sent for testing due to c/o odoor  Ointment prescribed for rash  Labs today, cBC, lipid , tSH and chem 7 and EGFR  It is important that you exercise regularly at least 30 minutes 5 times a week. If you develop chest pain, have severe difficulty breathing, or feel very tired, stop exercising immediately and seek medical attention  Think about what you will eat, plan ahead. Choose " clean, green, fresh or frozen" over canned, processed or packaged foods which are more sugary, salty and fatty. 70 to 75% of food eaten should be vegetables and fruit. Three meals at set times with snacks allowed between meals, but they must be fruit or vegetables. Aim to eat over a 12 hour period , example 7 am to 7 pm, and STOP after  your last meal of the day. Drink water,generally about 64 ounces per day, no other drink is as healthy. Fruit juice is best enjoyed in a healthy way, by EATING the fruit. Thanks for choosing [facility], we consider it a privelige to serve you.

## 2020-07-01 NOTE — Progress Notes (Signed)
Shirley Bush     MRN: [id number]      DOB: [birth year]  HPI: Patient is in for annual physical exam. C/o vaginal odor Not currently taking her blood pressure medication, wants to try another one C/o IBS, has upcoming GI appointment C/o rash and itch generalized and sporadic Recent labs,  are reviewed. Immunization is reviewed , and  updated if needed.   PE: BP (!) 151/100   Pulse (!) 102   Resp 16   Ht 5\' 3"  (1.6 m)   Wt 153 lb 6.4 oz (69.6 kg)   SpO2 97%   BMI 27.17 kg/m   Pleasant  female, alert and oriented x 3, in no cardio-pulmonary distress. Afebrile. HEENT No facial trauma or asymetry. Sinuses non tender.  Extra occullar muscles intact.. External ears normal, . Neck: supple, no adenopathy,JVD or thyromegaly.No bruits.  Chest: Clear to ascultation bilaterally.No crackles or wheezes. Non tender to palpation  Breast: No asymetry,no masses or lumps. No tenderness. No nipple discharge or inversion. No axillary or supraclavicular adenopathy  Cardiovascular system; Heart sounds normal,  S1 and  S2 ,no S3.  No murmur, or thrill. Apical beat not displaced Peripheral pulses normal.  Abdomen: Soft, non tender, no organomegaly or masses. No bruits. Bowel sounds normal. No guarding, tenderness or rebound.   GU: External genitalia normal female genitalia , normal female distribution of hair. No lesions. Urethral meatus normal in size, no  Prolapse, no lesions visibly  Present. Bladder non tender. Vagina pink and moist , with no visible lesions ,fishy  discharge present . Adequate pelvic support no  cystocele or rectocele noted Cervix pink and appears healthy, no lesions or ulcerations noted, no discharge noted from os Uterus enlarged , no adnexal masses, no cervical motion or adnexal tenderness.   Musculoskeletal exam: Full ROM of spine, hips , shoulders and knees. No deformity ,swelling or crepitus noted. No muscle wasting or atrophy.    Neurologic: Cranial nerves 2 to 12 intact. Power, tone ,sensation and reflexes normal throughout. No disturbance in gait. No tremor.  Skin: Intact, macular  rash noted.on forearms Pigmentation normal throughout  Psych; Normal mood and affect. Judgement and concentration normal   Assessment & Plan:  Annual visit for general adult medical examination with abnormal findings Annual exam as documented. Counseling done  re healthy lifestyle involving commitment to 150 minutes exercise per week, heart healthy diet, and attaining healthy weight.The importance of adequate sleep also discussed. Regular seat belt use and home safety, is also discussed. Changes in health habits are decided on by the patient with goals and time frames  set for achieving them. Immunization and cancer screening needs are specifically addressed at this visit.   Essential hypertension Uncontrolled, start clonidine and re evaluate DASH diet and commitment to daily physical activity for a minimum of 30 minutes discussed and encouraged, as a part of hypertension management. The importance of attaining a healthy weight is also discussed.  BP/Weight 07/01/2020 12/09/2019 11/06/2019 10/06/2019 07/24/2019 07/16/2019 94/85/4627  Systolic BP 035 009 381 829 937 169 678  Diastolic BP 938 88 92 98 96 96 88  Wt. (Lbs) 153.4 161.2 162.2 164.8 163 163 160  BMI 27.17 28.56 28.73 29.19 28.87 28.87 28.34  Some encounter information is confidential and restricted. Go to Review Flowsheets activity to see all data.       Vaginal odor Specimen sent for testing, treat after result is available   Rash and other nonspecific skin eruption mycolog topically x 1  wek, then , as needed

## 2020-07-02 ENCOUNTER — Encounter: Payer: Self-pay | Admitting: Family Medicine

## 2020-07-02 ENCOUNTER — Telehealth: Payer: Self-pay | Admitting: *Deleted

## 2020-07-02 DIAGNOSIS — R21 Rash and other nonspecific skin eruption: Secondary | ICD-10-CM | POA: Insufficient documentation

## 2020-07-02 DIAGNOSIS — N760 Acute vaginitis: Secondary | ICD-10-CM | POA: Insufficient documentation

## 2020-07-02 LAB — CBC
Hematocrit: 41.5 % (ref 34.0–46.6)
Hemoglobin: 13.9 g/dL (ref 11.1–15.9)
MCH: 29.5 pg (ref 26.6–33.0)
MCHC: 33.5 g/dL (ref 31.5–35.7)
MCV: 88 fL (ref 79–97)
Platelets: 387 10*3/uL (ref 150–450)
RBC: 4.71 x10E6/uL (ref 3.77–5.28)
RDW: 12.1 % (ref 11.7–15.4)
WBC: 7.7 10*3/uL (ref 3.4–10.8)

## 2020-07-02 LAB — CERVICOVAGINAL ANCILLARY ONLY
Bacterial Vaginitis (gardnerella): POSITIVE — AB
Candida Glabrata: NEGATIVE
Candida Vaginitis: NEGATIVE
Chlamydia: NEGATIVE
Comment: NEGATIVE
Comment: NEGATIVE
Comment: NEGATIVE
Comment: NEGATIVE
Comment: NEGATIVE
Comment: NORMAL
Neisseria Gonorrhea: NEGATIVE
Trichomonas: NEGATIVE

## 2020-07-02 LAB — BMP8+EGFR
BUN/Creatinine Ratio: 6 — ABNORMAL LOW (ref 12–28)
BUN: 5 mg/dL — ABNORMAL LOW (ref 8–27)
CO2: 25 mmol/L (ref 20–29)
Calcium: 10.4 mg/dL — ABNORMAL HIGH (ref 8.7–10.3)
Chloride: 100 mmol/L (ref 96–106)
Creatinine, Ser: 0.85 mg/dL (ref 0.57–1.00)
GFR calc Af Amer: 85 mL/min/{1.73_m2} (ref >59)
GFR calc non Af Amer: 74 mL/min/{1.73_m2} (ref >59)
Glucose: 104 mg/dL — ABNORMAL HIGH (ref 65–99)
Potassium: 4.2 mmol/L (ref 3.5–5.2)
Sodium: 141 mmol/L (ref 134–144)

## 2020-07-02 LAB — LIPID PANEL
Chol/HDL Ratio: 4.6 ratio — ABNORMAL HIGH (ref 0.0–4.4)
Cholesterol, Total: 274 mg/dL — ABNORMAL HIGH (ref 100–199)
HDL: 59 mg/dL (ref >39)
LDL Chol Calc (NIH): 190 mg/dL — ABNORMAL HIGH (ref 0–99)
Triglycerides: 137 mg/dL (ref 0–149)
VLDL Cholesterol Cal: 25 mg/dL (ref 5–40)

## 2020-07-02 LAB — TSH: TSH: 3 u[IU]/mL (ref 0.450–4.500)

## 2020-07-02 MED ORDER — METRONIDAZOLE 500 MG PO TABS: 500.0000 mg | ORAL_TABLET | Freq: Two times a day (BID) | ORAL | 0 refills | Status: DC

## 2020-07-02 NOTE — Assessment & Plan Note (Signed)
Uncontrolled, start clonidine and re evaluate DASH diet and commitment to daily physical activity for a minimum of 30 minutes discussed and encouraged, as a part of hypertension management. The importance of attaining a healthy weight is also discussed.  BP/Weight 07/01/2020 12/09/2019 11/06/2019 10/06/2019 07/24/2019 07/16/2019 08/81/1031  Systolic BP 594 585 929 244 628 638 177  Diastolic BP 116 88 92 98 96 96 88  Wt. (Lbs) 153.4 161.2 162.2 164.8 163 163 160  BMI 27.17 28.56 28.73 29.19 28.87 28.87 28.34  Some encounter information is confidential and restricted. Go to Review Flowsheets activity to see all data.

## 2020-07-02 NOTE — Telephone Encounter (Signed)
Pt would like information on instetinal diet as she is anxious and doesn't want to wait until she goes to gastro please advise

## 2020-07-02 NOTE — Assessment & Plan Note (Signed)

## 2020-07-02 NOTE — Assessment & Plan Note (Signed)
mycolog topically x 1 wek, then , as needed

## 2020-07-02 NOTE — Assessment & Plan Note (Signed)
Specimen sent for testing, treat after result is available

## 2020-07-02 NOTE — Telephone Encounter (Signed)
Diet rich I fruiot, vegetable , beans, low fat protein sources, like fish, turkey,chicken breast, egg white, drink 64 ounces water daily. Anything  moe specific for intestinal problems will have to wait on I recommendations

## 2020-07-06 NOTE — Telephone Encounter (Signed)
Pt advised of recommendation with verbal understanding  

## 2020-07-08 ENCOUNTER — Telehealth: Payer: Self-pay

## 2020-07-08 NOTE — Telephone Encounter (Signed)
Patient called and said the ointment provider was $40. She was wanting to know if there is another one that could be covered so it would be cheaper. I looked up patient's formulary and explained to her that ointment was a Tier 2 and there wasn't a lower tier on her formulary. She verbalized understanding and thanked me for looking it up for her.

## 2020-07-14 ENCOUNTER — Ambulatory Visit (HOSPITAL_COMMUNITY)
Admission: RE | Admit: 2020-07-14 | Discharge: 2020-07-14 | Disposition: A | Discharge status: Home / Self Care | Payer: Medicare Other | Source: Ambulatory Visit | Attending: Family Medicine | Admitting: Family Medicine

## 2020-07-14 ENCOUNTER — Other Ambulatory Visit: Payer: Self-pay

## 2020-07-14 DIAGNOSIS — Z1231 Encounter for screening mammogram for malignant neoplasm of breast: Secondary | ICD-10-CM | POA: Diagnosis not present

## 2020-07-17 NOTE — Progress Notes (Signed)
Referring Provider: Kerri Perches, MD Primary Care Physician:  Kerri Perches, MD Primary GI Physician: Dr. Marletta Lor  Chief Complaint  Patient presents with  . Abdominal Pain    Occurs with needing to have bm, relieved with bm  . Back Pain    Occurs with needing to have bm, relieved with bm    HPI:   Shirley Bush is a 65 y.o. female presenting today for abdominal pain. History of constipation with associated RLQ abdominal pain that improves with  BMs. Softgel medications are known triggers of constipation.  TCS on 01/14/19 with one 3 mm tubular adenoma in the mid ascending colon, moderate diverticulosis in entire colon, external and internal hemorrhoids, tortuous left colon. Recommended repeat in 5 years.   Last seen via virtual visit 06/05/19 for constipation and gaseous abdominal distension. Bowels were moving well with eating sauerkraut a couple times a week and whole wheat bread. Noted sauerkraut caused gas. Recommended she continue dietary changes, drink enough water to keep urine pale yellow to clear, and use MriaLAX 1 capful daily as needed. To help with gas/bloating, avoid gas producing items, use simethicone tablets rather than softgels for immediate relief or use Beano tablets prior to meals to prevent gas production.   Recently evaluated by OBGYN for pelvic pain. Pelvic/transvaginal US ordered and advised to follow-up with GI. Pelvic US unrevealing other than calcified leiomyomata throughout uterus. The fibroids are larger than reported on [place] in July 2019, but about the same as reported on CT in October 2019.    Today:  Prior to a BM and during a BM, she has lower abdominal pain/back pain.  Pain completely resolves after a bowel movement.  No abdominal pain between bowel movements.  States symptoms occur if she is constipated or when bowels are soft, but symptoms are worse when she is constipated.  States constipation/abdominal pain can be worse depending on what she  is eating.  Feels weak proximal and base may worsen symptoms.  Dairy products cause increased gas production.  In general, BMs are daily to every other day.  Constipation probably couple times a week.  Bowel movements are incomplete.  States the symptoms are similar to symptoms she had prior to her colonoscopy in 2020.  States she felt much better after her colonoscopy when she was "cleaned out".  Previously, she has tried probiotics and fiber supplement without any improvement.  She felt the fiber supplement worsened her gas production.  Currently using Beano as needed which works well.  She drinks lactose-free milk but does consume a lot of cheese.  States she drinks quite a bit of water.   No BRBPR, melena, nausea, vomiting, or fever.  She has lost some weight since cutting out bread and working on decreasing soda intake.  Over the last year, she has had an intermittent rash that occurs on her arms, legs, and buttocks.  Not sure if certain food items are triggering this.  PCP has prescribed nystatin-triamcinolone ointment which is helping.  No urinary symptoms.    Past Medical History:  Diagnosis Date  . Anxiety   . Depression 2002  . Depression    Phreesia 06/28/2020  . Fibromyalgia   . Hypertension 2004  . Medial meniscus tear 04/2013   left  . Migraines     Past Surgical History:  Procedure Laterality Date  . CESAREAN SECTION  1978  . CESAREAN SECTION  1980  . CESAREAN SECTION  1992  . CESAREAN SECTION N/A  Phreesia 06/28/2020  . COLONOSCOPY  02/05/2009  . COLONOSCOPY N/A 03/06/2014   Procedure: COLONOSCOPY;  Surgeon: Danie Binder, MD;  [facility];  Service: Endoscopy;  Laterality: N/A;  10:30 AM  . COLONOSCOPY WITH PROPOFOL N/A 01/14/2019   Procedure: COLONOSCOPY WITH PROPOFOL;  Surgeon: Danie Binder, MD;  [facility];  Service: Endoscopy;  Laterality: N/A;  2:00pm  . FINGER SURGERY Right    long finger  . HERNIA REPAIR N/A    Phreesia  06/28/2020  . JOINT REPLACEMENT N/A    Phreesia 06/28/2020  . KNEE ARTHROSCOPY WITH MEDIAL MENISECTOMY Left 04/24/2013   Procedure: LEFT KNEE ARTHROSCOPY CHONDROPLASTY WITH MEDIAL MENISECTOMY;  Surgeon: Ninetta Lights, MD;  Location: [place];  Service: Orthopedics;  Laterality: Left;  . SHOULDER ARTHROSCOPY Left 2009  . SHOULDER ARTHROSCOPY Right   . SHOULDER ARTHROSCOPY WITH ROTATOR CUFF REPAIR Left 11/25/2004  . TOTAL KNEE ARTHROPLASTY Left 11/11/2014   Procedure: LEFT TOTAL KNEE ARTHROPLASTY;  Surgeon: Kathryne Hitch, MD;  Location: [place];  Service: Orthopedics;  Laterality: Left;  . TUBAL LIGATION  1992  . UMBILICAL HERNIA REPAIR  2008    Current Outpatient Medications  Medication Sig Dispense Refill  . cholecalciferol (VITAMIN D3) 25 MCG (1000 UNIT) tablet Take 3,000 Units by mouth daily.    . cloNIDine (CATAPRES) 0.2 MG tablet Take 1 tablet (0.2 mg total) by mouth 2 (two) times daily. Take one tablet by mouth at bedtime for blood pressure 30 tablet 2  . diphenhydrAMINE (BENADRYL) 25 MG tablet Take 25 mg by mouth as needed.    . magnesium oxide (MAG-OX) 400 MG tablet Take 1 tablet by mouth daily.    . metoprolol succinate (TOPROL-XL) 50 MG 24 hr tablet Take 1 tablet (50 mg total) by mouth daily. Take with or immediately following a meal. 90 tablet 0  . metroNIDAZOLE (FLAGYL) 500 MG tablet Take 1 tablet (500 mg total) by mouth 2 (two) times daily. 14 tablet 0  . Misc Natural Products (AIRBORNE ELDERBERRY) CHEW Chew 1 each by mouth daily.    . Multiple Vitamins-Minerals (HAIR SKIN AND NAILS FORMULA) TABS Take 3 tablets by mouth daily.    Marland Kitchen nystatin-triamcinolone ointment (MYCOLOG) Apply twice daily to rash for 10 days, then as needed 60 g 0  . Simethicone (GAS-X PO) Take by mouth as needed.    Marland Kitchen Specialty Vitamins Products (COLLAGEN ULTRA) CAPS Take 1 each by mouth daily.     No current facility-administered medications for this visit.    Allergies as of 07/19/2020 -  Review Complete 07/19/2020  Allergen Reaction Noted  . Lisinopril Cough 06/26/2018  . Maxzide [hydrochlorothiazide w-triamterene] Itching 07/16/2019  . Other Rash 06/28/2020  . Buspar [buspirone]  07/16/2019  . Crestor [rosuvastatin calcium] Rash 05/01/2019    Family History  Problem Relation Age of Onset  . Hypertension Mother   . Hypertension Father   . Alcohol abuse Father   . Seizures Father   . Migraines Sister   . Thyroid disease Sister   . Colon cancer Sister        diagnosed in late 21s.   . Thyroid disease Sister   . Cancer Sister        breast   . Cancer Maternal Aunt        gyne cancer    Social History   Socioeconomic History  . Marital status: Divorced    Spouse name: Not on file  . Number of children:  3  . Years of education: 12 grade   . Highest education level: 12th grade  Occupational History  . Occupation: disability   Tobacco Use  . Smoking status: Never Smoker  . Smokeless tobacco: Never Used  Vaping Use  . Vaping Use: Never used  Substance and Sexual Activity  . Alcohol use: Yes    Comment: occasionally- rarely.   . Drug use: No  . Sexual activity: Not Currently  Other Topics Concern  . Not on file  Social History Narrative   Son is living with her at the moment, patient states that she is isolating in her home and is depressed more lately    Social Determinants of Health   Financial Resource Strain: Not on file  Food Insecurity: Not on file  Transportation Needs: Not on file  Physical Activity: Not on file  Stress: Not on file  Social Connections: Not on file    Review of Systems: Gen: Denies fever, chills, cold or flulike symptoms, presyncope, syncope. CV: Denies chest pain or palpitations. Resp: Denies dyspnea or cough. GI: See HPI  Derm: See HPI  Heme: See HPI  Physical Exam: BP (!) 146/96   Pulse (!) 101   Temp (!) 97.1 F (36.2 C) (Temporal)   Ht 5\' 3"  (1.6 m)   Wt 152 lb 6.4 oz (69.1 kg)   BMI 27.00 kg/m   General:   Alert and oriented. No distress noted. Pleasant and cooperative.  Head:  Normocephalic and atraumatic. Eyes:  Conjuctiva clear without scleral icterus. Heart:  S1, S2 present without murmurs appreciated. Lungs:  Clear to auscultation bilaterally. No wheezes, rales, or rhonchi. No distress.  Abdomen:  +BS, soft, and non-distended.  Minimal TTP across lower abdomen.  No rebound or guarding. No HSM or masses noted. Msk:  Symmetrical without gross deformities. Normal posture. Extremities:  Without edema. Neurologic:  Alert and  oriented x4 Psych:  Normal mood and affect.

## 2020-07-19 ENCOUNTER — Ambulatory Visit: Payer: Medicare Other | Admitting: Gastroenterology

## 2020-07-19 ENCOUNTER — Encounter: Payer: Self-pay | Admitting: Gastroenterology

## 2020-07-19 ENCOUNTER — Other Ambulatory Visit: Payer: Self-pay

## 2020-07-19 VITALS — BP 146/96 | HR 101 | Temp 97.1°F | Ht 63.0 in | Wt 152.4 lb

## 2020-07-19 DIAGNOSIS — R1030 Lower abdominal pain, unspecified: Secondary | ICD-10-CM | POA: Diagnosis not present

## 2020-07-19 DIAGNOSIS — R21 Rash and other nonspecific skin eruption: Secondary | ICD-10-CM

## 2020-07-19 DIAGNOSIS — K5900 Constipation, unspecified: Secondary | ICD-10-CM

## 2020-07-19 NOTE — Progress Notes (Signed)
Cc'ed to pcp °

## 2020-07-19 NOTE — Assessment & Plan Note (Signed)
Addressed under abdominal pain.  

## 2020-07-19 NOTE — Assessment & Plan Note (Addendum)
65 year old female with history of constipation with associated lower abdominal pain presenting today with concerns for lower abdominal pain/lower back pain prior to and during bowel movements that resolves thereafter for the last 4 months.  Managing constipation with her diet and reports typically having bowel movements daily to every other day.  Constipation a couple times a week.  BMs are incomplete.  Denies abdominal pain between bowel movements and no abdominal pain with eating.  Later states she feels beef and wheat products do worsen her symptoms and dairy products increase gas.  Interestingly, she also reports intermittent rash on her upper extremities, lower extremities, and buttocks intermittently for the last year.  Not sure if this is being triggered by certain food items.  No BRBPR or melena.  She has lost about 10 pounds over the last 8 months which she attributes to changing her diet (cutting back on soda and eliminating bread).  Colonoscopy up-to-date in June 2021: 3 mm tubular adenoma, moderate pancolonic diverticulosis, tortuous left colon, external and internal hemorrhoids with recommendations to repeat in 5 years.  Recent pelvic/transvaginal ultrasound with stable calcified leiomyomata throughout uterus.  Abdominal exam with minimal tenderness palpation across lower abdomen.  Suspect IBS-C.  Doubt diverticulitis.  Considering rash and reports of wheat and beef products worsening symptoms, cannot rule out celiac disease or alpha gal allergy.  Plan: IgA total, TTG IgA, alpha gal panel. Start MiraLAX 1 capful (17 g) daily in 8 ounces of water. Avoid dietary triggers of constipation/abdominal pain/gas including wheat, beef products, and dairy products. May try taking Lactaid prior to dairy consumption. Requested progress report in 2 weeks and to call sooner if her abdominal pain is worsening.   Plan to follow-up in 2 months. Will pursue imaging if symptoms are not improving with better  management of constipation or if abdominal pain worsens.

## 2020-07-19 NOTE — Patient Instructions (Addendum)
Please have labs completed at [facility].  Start MiraLAX 1 capful (17 g) daily in 8 ounces of water or other noncarbonated beverage.  Drink enough water daily to keep your urine pale yellow to clear.  Avoid known dietary triggers of constipation/abdominal discomfort including beef products, wheat products, and dairy products.  You may try taking Lactaid prior to dairy consumption to see if this helps prevent gas/bloating.  Please call with a progress report in 2 weeks or prior if needed.  If you have any worsening abdominal pain, please let me know and we will pursue imaging.  We will plan to see you back in 2 months.  It was good to see you today!  Ermalinda Memos, PA-C [facility]

## 2020-07-26 ENCOUNTER — Telehealth (INDEPENDENT_AMBULATORY_CARE_PROVIDER_SITE_OTHER): Payer: Medicare Other

## 2020-07-26 ENCOUNTER — Other Ambulatory Visit: Payer: Self-pay

## 2020-07-26 DIAGNOSIS — Z0100 Encounter for examination of eyes and vision without abnormal findings: Secondary | ICD-10-CM

## 2020-07-26 DIAGNOSIS — Z0000 Encounter for general adult medical examination without abnormal findings: Secondary | ICD-10-CM | POA: Diagnosis not present

## 2020-07-26 NOTE — Progress Notes (Signed)
Subjective:   Shirley Bush is a 65 y.o. female who presents for Medicare Annual (Subsequent) preventive examination.     Objective:    There were no vitals filed for this visit. There is no height or weight on file to calculate BMI.  Advanced Directives 01/14/2019 07/22/2018 11/11/2014 10/30/2014 03/06/2014 04/17/2013  Does Patient Have a Medical Advance Directive? No No No No No Patient does not have advance directive;Patient would like information  Would patient like information on creating a medical advance directive? No - Patient declined Yes (ED - Information included in AVS) Yes - Educational materials given Yes - Educational materials given No - patient declined information Advance directive packet given  Some encounter information is confidential and restricted. Go to Review Flowsheets activity to see all data.    Current Medications (verified) Outpatient Encounter Medications as of 07/26/2020  Medication Sig  . cholecalciferol (VITAMIN D3) 25 MCG (1000 UNIT) tablet Take 3,000 Units by mouth daily.  . cloNIDine (CATAPRES) 0.2 MG tablet Take 1 tablet (0.2 mg total) by mouth 2 (two) times daily. Take one tablet by mouth at bedtime for blood pressure  . diphenhydrAMINE (BENADRYL) 25 MG tablet Take 25 mg by mouth as needed.  . magnesium oxide (MAG-OX) 400 MG tablet Take 1 tablet by mouth daily.  . metoprolol succinate (TOPROL-XL) 50 MG 24 hr tablet Take 1 tablet (50 mg total) by mouth daily. Take with or immediately following a meal.  . metroNIDAZOLE (FLAGYL) 500 MG tablet Take 1 tablet (500 mg total) by mouth 2 (two) times daily.  . Misc Natural Products (AIRBORNE ELDERBERRY) CHEW Chew 1 each by mouth daily.  . Multiple Vitamins-Minerals (HAIR SKIN AND NAILS FORMULA) TABS Take 3 tablets by mouth daily.  Marland Kitchen nystatin-triamcinolone ointment (MYCOLOG) Apply twice daily to rash for 10 days, then as needed  . Simethicone (GAS-X PO) Take by mouth as needed.  Marland Kitchen Specialty Vitamins Products  (COLLAGEN ULTRA) CAPS Take 1 each by mouth daily.   No facility-administered encounter medications on file as of 07/26/2020.    Allergies (verified) Lisinopril, Maxzide [hydrochlorothiazide w-triamterene], Other, Buspar [buspirone], and Crestor [rosuvastatin calcium]   History: Past Medical History:  Diagnosis Date  . Anxiety   . Depression 2002  . Depression    Phreesia 06/28/2020  . Depression    Phreesia 07/23/2020  . Fibromyalgia   . Hypertension 2004  . Medial meniscus tear 04/2013   left  . Migraines    Past Surgical History:  Procedure Laterality Date  . CESAREAN SECTION  1978  . CESAREAN SECTION  1980  . CESAREAN SECTION  1992  . CESAREAN SECTION N/A    Phreesia 06/28/2020  . COLONOSCOPY  02/05/2009  . COLONOSCOPY N/A 03/06/2014   Procedure: COLONOSCOPY;  Surgeon: West Bali, MD;  Location: [facility];  Service: Endoscopy;  Laterality: N/A;  10:30 AM  . COLONOSCOPY WITH PROPOFOL N/A 01/14/2019   Procedure: COLONOSCOPY WITH PROPOFOL;  Surgeon: West Bali, MD;  Location: [facility];  Service: Endoscopy;  Laterality: N/A;  2:00pm  . FINGER SURGERY Right    long finger  . HERNIA REPAIR N/A    Phreesia 06/28/2020  . JOINT REPLACEMENT N/A    Phreesia 06/28/2020  . KNEE ARTHROSCOPY WITH MEDIAL MENISECTOMY Left 04/24/2013   Procedure: LEFT KNEE ARTHROSCOPY CHONDROPLASTY WITH MEDIAL MENISECTOMY;  Surgeon: Loreta Ave, MD;  Location: [facility];  Service: Orthopedics;  Laterality: Left;  . SHOULDER ARTHROSCOPY Left 2009  .  SHOULDER ARTHROSCOPY Right   . SHOULDER ARTHROSCOPY WITH ROTATOR CUFF REPAIR Left 11/25/2004  . TOTAL KNEE ARTHROPLASTY Left 11/11/2014   Procedure: LEFT TOTAL KNEE ARTHROPLASTY;  Surgeon: Kathryne Hitch, MD;  Location: [facility];  Service: Orthopedics;  Laterality: Left;  . TUBAL LIGATION  1992  . UMBILICAL HERNIA REPAIR  2008   Family History  Problem Relation Age of Onset  . Hypertension Mother   . Hypertension Father    . Alcohol abuse Father   . Seizures Father   . Migraines Sister   . Thyroid disease Sister   . Colon cancer Sister        diagnosed in late 92s.   . Thyroid disease Sister   . Cancer Sister        breast   . Cancer Maternal Aunt        gyne cancer   Social History   Socioeconomic History  . Marital status: Divorced    Spouse name: Not on file  . Number of children: 3  . Years of education: 12 grade   . Highest education level: 12th grade  Occupational History  . Occupation: disability   Tobacco Use  . Smoking status: Never Smoker  . Smokeless tobacco: Never Used  Vaping Use  . Vaping Use: Never used  Substance and Sexual Activity  . Alcohol use: Yes    Comment: occasionally- rarely.   . Drug use: No  . Sexual activity: Not Currently  Other Topics Concern  . Not on file  Social History Narrative   Son is living with her at the moment, patient states that she is isolating in her home and is depressed more lately    Social Determinants of Health   Financial Resource Strain: Not on file  Food Insecurity: Not on file  Transportation Needs: Not on file  Physical Activity: Not on file  Stress: Not on file  Social Connections: Not on file    Tobacco Counseling Counseling given: Not Answered   Clinical Intake:                 Diabetic? No          Activities of Daily Living No flowsheet data found.  Patient Care Team: Fayrene Helper, MD as PCP - General Norman Clay, MD as Consulting Physician (Psychiatry) Eloise Harman, DO as Consulting Physician (Gastroenterology)  Indicate any recent Medical Services you may have received from other than [facility] providers in the past year (date may be approximate).     Assessment:   This is a routine wellness examination for [place].  Hearing/Vision screen No exam data present  Dietary issues and exercise activities discussed:    Goals    . Increase physical activity      Depression  Screen PHQ 2/9 Scores 07/01/2020 11/06/2019 10/06/2019 07/24/2019 07/16/2019 05/13/2019 05/13/2019  PHQ - 2 Score 6 0 0 0 5 1 6   PHQ- 9 Score 11 - - - 10 - 10    Fall Risk Fall Risk  07/01/2020 11/06/2019 10/06/2019 07/24/2019 07/16/2019  Falls in the past year? 0 0 0 0 0  Number falls in past yr: 0 0 0 0 0  Injury with Fall? 0 0 0 0 0  Follow up - Falls evaluation completed Falls evaluation completed - -    FALL RISK PREVENTION PERTAINING TO THE HOME:  Any stairs in or around the home? No  If so, are there any without handrails? No  Home free of loose  throw rugs in walkways, pet beds, electrical cords, etc? Yes  Adequate lighting in your home to reduce risk of falls? Yes   ASSISTIVE DEVICES UTILIZED TO PREVENT FALLS:  Life alert? No  Use of a cane, walker or w/c? No  Grab bars in the bathroom? No  Shower chair or bench in shower? No  Elevated toilet seat or a handicapped toilet? No   TIMED UP AND GO:  Was the test performed? No .    Cognitive Function:     6CIT Screen 07/24/2019 07/22/2018  What Year? 0 points 0 points  What month? 0 points 0 points  What time? 0 points 0 points  Count back from 20 0 points 0 points  Months in reverse 0 points 0 points  Repeat phrase 0 points 4 points  Total Score 0 4    Immunizations Immunization History  Administered Date(s) Administered  . Influenza,inj,Quad PF,6+ Mos 05/06/2014, 10/05/2015, 04/27/2016, 03/11/2018, 04/28/2019, 04/16/2020  . Moderna Sars-Covid-2 Vaccination 09/18/2019, 10/21/2019, 05/21/2020  . Td 05/17/2000  . Tdap 02/10/2011    TDAP status: Up to date  Flu Vaccine status: Up to date  Pneumococcal vaccine status: Up to date  Covid-19 vaccine status: Completed vaccines  Qualifies for Shingles Vaccine? Yes   Zostavax completed Yes   Shingrix Completed?: Yes  Screening Tests Health Maintenance  Topic Date Due  . TETANUS/TDAP  02/09/2021  . PAP SMEAR-Modifier  04/07/2022  . MAMMOGRAM  07/14/2022  .  COLONOSCOPY (Pts 45-28yrs Insurance coverage will need to be confirmed)  01/13/2029  . INFLUENZA VACCINE  Completed  . COVID-19 Vaccine  Completed  . Hepatitis C Screening  Completed  . HIV Screening  Completed    Health Maintenance  There are no preventive care reminders to display for this patient.  Colorectal cancer screening: Type of screening: Colonoscopy. Completed normal. Repeat every 5 years  Mammogram status: Completed normal . Repeat every year  Bone Density: Completed.   Lung Cancer Screening: (Low Dose CT Chest recommended if Age 51-80 years, 30 pack-year currently smoking OR have quit w/in 15years.) does not qualify.     Additional Screening:  Hepatitis C Screening: does qualify; Completed.  Vision Screening: Recommended annual ophthalmology exams for early detection of glaucoma and other disorders of the eye. Is the patient up to date with their annual eye exam?  Yes  Who is the provider or what is the name of the office in which the patient attends annual eye exams? Wants referral.   If pt is not established with a provider, would they like to be referred to a provider to establish care? Yes .   Dental Screening: Recommended annual dental exams for proper oral hygiene  Community Resource Referral / Chronic Care Management: CRR required this visit?  No   CCM required this visit?  No      Plan:     I have personally reviewed and noted the following in the patient's chart:   . Medical and social history . Use of alcohol, tobacco or illicit drugs  . Current medications and supplements . Functional ability and status . Nutritional status . Physical activity . Advanced directives . List of other physicians . Hospitalizations, surgeries, and ER visits in previous 12 months . Vitals . Screenings to include cognitive, depression, and falls . Referrals and appointments  In addition, I have reviewed and discussed with patient certain preventive protocols,  quality metrics, and best practice recommendations. A written personalized care plan for preventive services as well  as general preventive health recommendations were provided to patient.     Dellia Cloud, LPN   [id number]/[id number]   Nurse Notes: AWV conducted over the phone with pt consent to televisit via audio. Pt was present in the home at the time and provider in the office. Phone call took approx. 20 min

## 2020-07-26 NOTE — Addendum Note (Signed)
Addended by: Lonn Georgia on: 07/26/2020 11:06 AM   Modules accepted: Orders

## 2020-07-26 NOTE — Patient Instructions (Signed)
Shirley Bush , Thank you for taking time to come for your Medicare Wellness Visit. I appreciate your ongoing commitment to your health goals. Please review the following plan we discussed and let me know if I can assist you in the future.   Screening recommendations/referrals: Colonoscopy: Complete; DUE 01/13/29 Mammogram: Complete; DUE 07/14/22 Bone Density: Complete  Recommended yearly ophthalmology/optometry visit for glaucoma screening and checkup: Referral [facility]   Recommended yearly dental visit for hygiene and checkup  Vaccinations: Influenza vaccine: Complete Pneumococcal vaccine: Complete  Tdap vaccine: Complete; DUE 02/09/21 Shingles vaccine: Complete    Advanced directives: N/A   Conditions/risks identified: None   Next appointment: 63/31/22 @ 10:00 am with Dr. Moshe Cipro   Preventive Care 63-64 Years, Female Preventive care refers to lifestyle choices and visits with your health care provider that can promote health and wellness. What does preventive care include?  A yearly physical exam. This is also called an annual well check.  Dental exams once or twice a year.  Routine eye exams. Ask your health care provider how often you should have your eyes checked.  Personal lifestyle choices, including:  Daily care of your teeth and gums.  Regular physical activity.  Eating a healthy diet.  Avoiding tobacco and drug use.  Limiting alcohol use.  Practicing safe sex.  Taking low-dose aspirin daily starting at age 66.  Taking vitamin and mineral supplements as recommended by your health care provider. What happens during an annual well check? The services and screenings done by your health care provider during your annual well check will depend on your age, overall health, lifestyle risk factors, and family history of disease. Counseling  Your health care provider may ask you questions about your:  Alcohol use.  Tobacco use.  Drug  use.  Emotional well-being.  Home and relationship well-being.  Sexual activity.  Eating habits.  Work and work Statistician.  Method of birth control.  Menstrual cycle.  Pregnancy history. Screening  You may have the following tests or measurements:  Height, weight, and BMI.  Blood pressure.  Lipid and cholesterol levels. These may be checked every 5 years, or more frequently if you are over 14 years old.  Skin check.  Lung cancer screening. You may have this screening every year starting at age 37 if you have a 30-pack-year history of smoking and currently smoke or have quit within the past 15 years.  Fecal occult blood test (FOBT) of the stool. You may have this test every year starting at age 71.  Flexible sigmoidoscopy or colonoscopy. You may have a sigmoidoscopy every 5 years or a colonoscopy every 10 years starting at age 15.  Hepatitis C blood test.  Hepatitis B blood test.  Sexually transmitted disease (STD) testing.  Diabetes screening. This is done by checking your blood sugar (glucose) after you have not eaten for a while (fasting). You may have this done every 1-3 years.  Mammogram. This may be done every 1-2 years. Talk to your health care provider about when you should start having regular mammograms. This may depend on whether you have a family history of breast cancer.  BRCA-related cancer screening. This may be done if you have a family history of breast, ovarian, tubal, or peritoneal cancers.  Pelvic exam and Pap test. This may be done every 3 years starting at age 14. Starting at age 24, this may be done every 5 years if you have a Pap test in combination with an HPV  test.  Bone density scan. This is done to screen for osteoporosis. You may have this scan if you are at high risk for osteoporosis. Discuss your test results, treatment options, and if necessary, the need for more tests with your health care provider. Vaccines  Your health care  provider may recommend certain vaccines, such as:  Influenza vaccine. This is recommended every year.  Tetanus, diphtheria, and acellular pertussis (Tdap, Td) vaccine. You may need a Td booster every 10 years.  Zoster vaccine. You may need this after age 68.  Pneumococcal 13-valent conjugate (PCV13) vaccine. You may need this if you have certain conditions and were not previously vaccinated.  Pneumococcal polysaccharide (PPSV23) vaccine. You may need one or two doses if you smoke cigarettes or if you have certain conditions. Talk to your health care provider about which screenings and vaccines you need and how often you need them. This information is not intended to replace advice given to you by your health care provider. Make sure you discuss any questions you have with your health care provider. Document Released: 07/30/2015 Document Revised: 03/22/2016 Document Reviewed: 05/04/2015 Elsevier Interactive Patient Education  2017 [place] Prevention in the Home Falls can cause injuries. They can happen to people of all ages. There are many things you can do to make your home safe and to help prevent falls. What can I do on the outside of my home?  Regularly fix the edges of walkways and driveways and fix any cracks.  Remove anything that might make you trip as you walk through a door, such as a raised step or threshold.  Trim any bushes or trees on the path to your home.  Use bright outdoor lighting.  Clear any walking paths of anything that might make someone trip, such as rocks or tools.  Regularly check to see if handrails are loose or broken. Make sure that both sides of any steps have handrails.  Any raised decks and porches should have guardrails on the edges.  Have any leaves, snow, or ice cleared regularly.  Use sand or salt on walking paths during winter.  Clean up any spills in your garage right away. This includes oil or grease spills. What can I do  in the bathroom?  Use night lights.  Install grab bars by the toilet and in the tub and shower. Do not use towel bars as grab bars.  Use non-skid mats or decals in the tub or shower.  If you need to sit down in the shower, use a plastic, non-slip stool.  Keep the floor dry. Clean up any water that spills on the floor as soon as it happens.  Remove soap buildup in the tub or shower regularly.  Attach bath mats securely with double-sided non-slip rug tape.  Do not have throw rugs and other things on the floor that can make you trip. What can I do in the bedroom?  Use night lights.  Make sure that you have a light by your bed that is easy to reach.  Do not use any sheets or blankets that are too big for your bed. They should not hang down onto the floor.  Have a firm chair that has side arms. You can use this for support while you get dressed.  Do not have throw rugs and other things on the floor that can make you trip. What can I do in the kitchen?  Clean up any spills right away.  Avoid  walking on wet floors.  Keep items that you use a lot in easy-to-reach places.  If you need to reach something above you, use a strong step stool that has a grab bar.  Keep electrical cords out of the way.  Do not use floor polish or wax that makes floors slippery. If you must use wax, use non-skid floor wax.  Do not have throw rugs and other things on the floor that can make you trip. What can I do with my stairs?  Do not leave any items on the stairs.  Make sure that there are handrails on both sides of the stairs and use them. Fix handrails that are broken or loose. Make sure that handrails are as long as the stairways.  Check any carpeting to make sure that it is firmly attached to the stairs. Fix any carpet that is loose or worn.  Avoid having throw rugs at the top or bottom of the stairs. If you do have throw rugs, attach them to the floor with carpet tape.  Make sure that you  have a light switch at the top of the stairs and the bottom of the stairs. If you do not have them, ask someone to add them for you. What else can I do to help prevent falls?  Wear shoes that:  Do not have high heels.  Have rubber bottoms.  Are comfortable and fit you well.  Are closed at the toe. Do not wear sandals.  If you use a stepladder:  Make sure that it is fully opened. Do not climb a closed stepladder.  Make sure that both sides of the stepladder are locked into place.  Ask someone to hold it for you, if possible.  Clearly mark and make sure that you can see:  Any grab bars or handrails.  First and last steps.  Where the edge of each step is.  Use tools that help you move around (mobility aids) if they are needed. These include:  Canes.  Walkers.  Scooters.  Crutches.  Turn on the lights when you go into a dark area. Replace any light bulbs as soon as they burn out.  Set up your furniture so you have a clear path. Avoid moving your furniture around.  If any of your floors are uneven, fix them.  If there are any pets around you, be aware of where they are.  Review your medicines with your doctor. Some medicines can make you feel dizzy. This can increase your chance of falling. Ask your doctor what other things that you can do to help prevent falls. This information is not intended to replace advice given to you by your health care provider. Make sure you discuss any questions you have with your health care provider. Document Released: 04/29/2009 Document Revised: 12/09/2015 Document Reviewed: 08/07/2014 Elsevier Interactive Patient Education  2017 [employer].

## 2020-07-27 LAB — ALPHA-GAL PANEL
Beef IgE: <0.1 kU/L (ref <0.35)
Class: 0
Class: 0
Class: 0
Galactose-alpha-1,3-galactose IgE: <0.1 kU/L (ref <0.10)
LAMB/MUTTON IGE: <0.1 kU/L (ref <0.35)
Pork IgE: <0.1 kU/L (ref <0.35)

## 2020-07-27 LAB — TISSUE TRANSGLUTAMINASE, IGA: (tTG) Ab, IgA: <1 U/mL

## 2020-07-27 LAB — IGA: Immunoglobulin A: 185 mg/dL (ref 70–320)

## 2020-07-28 ENCOUNTER — Other Ambulatory Visit: Payer: Self-pay | Admitting: *Deleted

## 2020-07-28 DIAGNOSIS — R21 Rash and other nonspecific skin eruption: Secondary | ICD-10-CM

## 2020-08-16 ENCOUNTER — Encounter: Payer: Self-pay | Admitting: Family Medicine

## 2020-08-16 ENCOUNTER — Telehealth (INDEPENDENT_AMBULATORY_CARE_PROVIDER_SITE_OTHER): Payer: Medicare Other | Admitting: Family Medicine

## 2020-08-16 ENCOUNTER — Telehealth: Payer: Medicare Other | Admitting: Family Medicine

## 2020-08-16 ENCOUNTER — Other Ambulatory Visit: Payer: Self-pay

## 2020-08-16 VITALS — BP 133/83 | Ht 63.0 in | Wt 154.0 lb

## 2020-08-16 DIAGNOSIS — M797 Fibromyalgia: Secondary | ICD-10-CM

## 2020-08-16 DIAGNOSIS — E559 Vitamin D deficiency, unspecified: Secondary | ICD-10-CM

## 2020-08-16 DIAGNOSIS — E785 Hyperlipidemia, unspecified: Secondary | ICD-10-CM | POA: Diagnosis not present

## 2020-08-16 DIAGNOSIS — K5909 Other constipation: Secondary | ICD-10-CM

## 2020-08-16 DIAGNOSIS — I10 Essential (primary) hypertension: Secondary | ICD-10-CM | POA: Diagnosis not present

## 2020-08-16 DIAGNOSIS — J309 Allergic rhinitis, unspecified: Secondary | ICD-10-CM

## 2020-08-16 DIAGNOSIS — N898 Other specified noninflammatory disorders of vagina: Secondary | ICD-10-CM

## 2020-08-16 MED ORDER — CLONIDINE HCL 0.2 MG PO TABS: 0.2000 mg | ORAL_TABLET | Freq: Two times a day (BID) | ORAL | 5 refills | Status: DC

## 2020-08-16 NOTE — Assessment & Plan Note (Signed)
Increased symptoms however will start exercises and stretches to help

## 2020-08-16 NOTE — Assessment & Plan Note (Signed)
Controlled, no change in medication DASH diet and commitment to daily physical activity for a minimum of 30 minutes discussed and encouraged, as a part of hypertension management. The importance of attaining a healthy weight is also discussed.  BP/Weight 08/16/2020 07/19/2020 07/01/2020 12/09/2019 11/06/2019 1/75/1025 02/18/2777  Systolic BP 242 353 614 [phone]  Diastolic BP 83 96 950 88 92 98 96  Wt. (Lbs) 154 152.4 153.4 161.2 162.2 164.8 163  BMI 27.28 27 27.17 28.56 28.73 29.19 28.87  Some encounter information is confidential and restricted. Go to Review Flowsheets activity to see all data.

## 2020-08-16 NOTE — Progress Notes (Signed)
Virtual Visit via Telephone Note  I connected with Shirley Bush on 08/16/20 at  1:00 PM EST by telephone and verified that I am speaking with the correct person using two identifiers.  Location: Patient: home Provider: work   I discussed the limitations, risks, security and privacy concerns of performing an evaluation and management service by telephone and the availability of in person appointments. I also discussed with the patient that there may be a patient responsible charge related to this service. The patient expressed understanding and agreed to proceed.   History of Present Illness: F/U uncontrolled hypertension F/U chronic problems and address any new or current concerns. Review and update medications and allergies. Review recent lab and radiologic data . Update routine health maintainace. Review an encourage improved health habits to include nutrition, exercise and  sleep .  1 week h/o increased head and chest congestion, drainage is light yellow green from head and chest No fever or chills, robitussin has been helping chest congestion also benadryl and sudafed, using ayers nasal saline spray for stuffy nose Denies recent fever or chills. Denies sinus pressure, nasal congestion, ear pain or sore throat. Denies chest congestion, productive cough or wheezing. Denies chest pains, palpitations and leg swelling Denies abdominal pain, nausea, vomiting,diarrhea Denies dysuria, frequency, hesitancy or incontinence. C/o fibromyalgia generalized increased , uses OTC, topical meds  Denies headaches, seizures, numbness, or tingling. Denies depression, anxiety or insomnia. Denies skin break down or rash.       Observations/Objective: BP 133/83   Ht 5\' 3"  (1.6 m)   Wt 154 lb (69.9 kg)   BMI 27.28 kg/m  Good communication with no confusion and intact memory. Alert and oriented x 3 No signs of respiratory distress during speech    Assessment and  Plan:  Fibromyalgia Increased symptoms however will start exercises and stretches to help  Essential hypertension Controlled, no change in medication DASH diet and commitment to daily physical activity for a minimum of 30 minutes discussed and encouraged, as a part of hypertension management. The importance of attaining a healthy weight is also discussed.  BP/Weight 08/16/2020 07/19/2020 07/01/2020 12/09/2019 11/06/2019 08/28/2480 5/0/0370  Systolic BP 488 891 694 503 888 280 034  Diastolic BP 83 96 917 88 92 98 96  Wt. (Lbs) 154 152.4 153.4 161.2 162.2 164.8 163  BMI 27.28 27 27.17 28.56 28.73 29.19 28.87  Some encounter information is confidential and restricted. Go to Review Flowsheets activity to see all data.       Allergic rhinitis Increased and uncontrolled symptoms, has upocoming allergy appt  Vaginal odor resolved  Constipation Relying on food choice to address this, prune juice , white apples   Follow Up Instructions:    I discussed the assessment and treatment plan with the patient. The patient was provided an opportunity to ask questions and all were answered. The patient agreed with the plan and demonstrated an understanding of the instructions.   The patient was advised to call back or seek an in-person evaluation if the symptoms worsen or if the condition fails to improve as anticipated.  I provided 22 minutes of non-face-to-face time during this encounter.   Tula Nakayama, MD

## 2020-08-16 NOTE — Patient Instructions (Addendum)
F/U end August, call if you need me sooner  Continue current medication  Glad that blood pressure is controlled on the medication you are taking  Please get fasting CBC, lipid, cmp and EGFr, TSH and vit D first week I April  It is important that you exercise regularly at least 30 minutes 5 times a week. If you develop chest pain, have severe difficulty breathing, or feel very tired, stop exercising immediately and seek medical attention    [email or web address]">  DASH Eating Plan DASH stands for Dietary Approaches to Stop Hypertension. The DASH eating plan is a healthy eating plan that has been shown to:  Reduce high blood pressure (hypertension).  Reduce your risk for type 2 diabetes, heart disease, and stroke.  Help with weight loss. What are tips for following this plan? Reading food labels  Check food labels for the amount of salt (sodium) per serving. Choose foods with less than 5 percent of the Daily Value of sodium. Generally, foods with less than 300 milligrams (mg) of sodium per serving fit into this eating plan.  To find whole grains, look for the word "whole" as the first word in the ingredient list. Shopping  Buy products labeled as "low-sodium" or "no salt added."  Buy fresh foods. Avoid canned foods and pre-made or frozen meals. Cooking  Avoid adding salt when cooking. Use salt-free seasonings or herbs instead of table salt or sea salt. Check with your health care provider or pharmacist before using salt substitutes.  Do not fry foods. Cook foods using healthy methods such as baking, boiling, grilling, roasting, and broiling instead.  Cook with heart-healthy oils, such as olive, canola, avocado, soybean, or sunflower oil. Meal planning  Eat a balanced diet that includes: ? 4 or more servings of fruits and 4 or more servings of vegetables each day. Try to fill one-half of your plate with fruits and vegetables. ? 6-8  servings of whole grains each day. ? Less than 6 oz (170 g) of lean meat, poultry, or fish each day. A 3-oz (85-g) serving of meat is about the same size as a deck of cards. One egg equals 1 oz (28 g). ? 2-3 servings of low-fat dairy each day. One serving is 1 cup (237 mL). ? 1 serving of nuts, seeds, or beans 5 times each week. ? 2-3 servings of heart-healthy fats. Healthy fats called omega-3 fatty acids are found in foods such as walnuts, flaxseeds, fortified milks, and eggs. These fats are also found in cold-water fish, such as sardines, salmon, and mackerel.  Limit how much you eat of: ? Canned or prepackaged foods. ? Food that is high in trans fat, such as some fried foods. ? Food that is high in saturated fat, such as fatty meat. ? Desserts and other sweets, sugary drinks, and other foods with added sugar. ? Full-fat dairy products.  Do not salt foods before eating.  Do not eat more than 4 egg yolks a week.  Try to eat at least 2 vegetarian meals a week.  Eat more home-cooked food and less restaurant, buffet, and fast food.   Lifestyle  When eating at a restaurant, ask that your food be prepared with less salt or no salt, if possible.  If you drink alcohol: ? Limit how much you use to:  0-1 drink a day for women who are not pregnant.  0-2 drinks a day for men. ? Be aware of how much alcohol is in your drink. In the [place] one  drink equals one 12 oz bottle of beer (355 mL), one 5 oz glass of wine (148 mL), or one 1 oz glass of hard liquor (44 mL). General information  Avoid eating more than 2,300 mg of salt a day. If you have hypertension, you may need to reduce your sodium intake to 1,500 mg a day.  Work with your health care provider to maintain a healthy body weight or to lose weight. Ask what an ideal weight is for you.  Get at least 30 minutes of exercise that causes your heart to beat faster (aerobic exercise) most days of the week. Activities may include walking,  swimming, or biking.  Work with your health care provider or dietitian to adjust your eating plan to your individual calorie needs. What foods should I eat? Fruits All fresh, dried, or frozen fruit. Canned fruit in natural juice (without added sugar). Vegetables Fresh or frozen vegetables (raw, steamed, roasted, or grilled). Low-sodium or reduced-sodium tomato and vegetable juice. Low-sodium or reduced-sodium tomato sauce and tomato paste. Low-sodium or reduced-sodium canned vegetables. Grains Whole-grain or whole-wheat bread. Whole-grain or whole-wheat pasta. Brown rice. [place]. Bulgur. Whole-grain and low-sodium cereals. Pita bread. Low-fat, low-sodium crackers. Whole-wheat flour tortillas. Meats and other proteins Skinless chicken or [place]. Ground chicken or [place]. Pork with fat trimmed off. Fish and seafood. Egg whites. Dried beans, peas, or lentils. Unsalted nuts, nut butters, and seeds. Unsalted canned beans. Lean cuts of beef with fat trimmed off. Low-sodium, lean precooked or cured meat, such as sausages or meat loaves. Dairy Low-fat (1%) or fat-free (skim) milk. Reduced-fat, low-fat, or fat-free cheeses. Nonfat, low-sodium ricotta or cottage cheese. Low-fat or nonfat yogurt. Low-fat, low-sodium cheese. Fats and oils Soft margarine without trans fats. Vegetable oil. Reduced-fat, low-fat, or light mayonnaise and salad dressings (reduced-sodium). Canola, safflower, olive, avocado, soybean, and sunflower oils. Avocado. Seasonings and condiments Herbs. Spices. Seasoning mixes without salt. Other foods Unsalted popcorn and pretzels. Fat-free sweets. The items listed above may not be a complete list of foods and beverages you can eat. Contact a dietitian for more information. What foods should I avoid? Fruits Canned fruit in a light or heavy syrup. Fried fruit. Fruit in cream or butter sauce. Vegetables Creamed or fried vegetables. Vegetables in a cheese sauce. Regular canned  vegetables (not low-sodium or reduced-sodium). Regular canned tomato sauce and paste (not low-sodium or reduced-sodium). Regular tomato and vegetable juice (not low-sodium or reduced-sodium). Angie Fava. Olives. Grains Baked goods made with fat, such as croissants, muffins, or some breads. Dry pasta or rice meal packs. Meats and other proteins Fatty cuts of meat. Ribs. Fried meat. Berniece Salines. Bologna, salami, and other precooked or cured meats, such as sausages or meat loaves. Fat from the back of a pig (fatback). Bratwurst. Salted nuts and seeds. Canned beans with added salt. Canned or smoked fish. Whole eggs or egg yolks. Chicken or [place] with skin. Dairy Whole or 2% milk, cream, and half-and-half. Whole or full-fat cream cheese. Whole-fat or sweetened yogurt. Full-fat cheese. Nondairy creamers. Whipped toppings. Processed cheese and cheese spreads. Fats and oils Butter. Stick margarine. Lard. Shortening. Ghee. Bacon fat. Tropical oils, such as coconut, palm kernel, or palm oil. Seasonings and condiments Onion salt, garlic salt, seasoned salt, table salt, and sea salt. Worcestershire sauce. Tartar sauce. Barbecue sauce. Teriyaki sauce. Soy sauce, including reduced-sodium. Steak sauce. Canned and packaged gravies. Fish sauce. Oyster sauce. Cocktail sauce. Store-bought horseradish. Ketchup. Mustard. Meat flavorings and tenderizers. Bouillon cubes. Hot sauces. Pre-made or packaged marinades. Pre-made or  packaged taco seasonings. Relishes. Regular salad dressings. Other foods Salted popcorn and pretzels. The items listed above may not be a complete list of foods and beverages you should avoid. Contact a dietitian for more information. Where to find more information  National Heart, Lung, and Blood Institute: https://wilson-eaton.com/  American Heart Association: www.heart.org  Academy of Nutrition and Dietetics: www.eatright.[place]: www.kidney.org Summary  The DASH eating plan is a  healthy eating plan that has been shown to reduce high blood pressure (hypertension). It may also reduce your risk for type 2 diabetes, heart disease, and stroke.  When on the DASH eating plan, aim to eat more fresh fruits and vegetables, whole grains, lean proteins, low-fat dairy, and heart-healthy fats.  With the DASH eating plan, you should limit salt (sodium) intake to 2,300 mg a day. If you have hypertension, you may need to reduce your sodium intake to 1,500 mg a day.  Work with your health care provider or dietitian to adjust your eating plan to your individual calorie needs. This information is not intended to replace advice given to you by your health care provider. Make sure you discuss any questions you have with your health care provider. Document Revised: 06/06/2019 Document Reviewed: 06/06/2019 Elsevier Patient Education  2021 [employer].

## 2020-08-16 NOTE — Assessment & Plan Note (Signed)
Relying on food choice to address this, prune juice , white apples

## 2020-08-16 NOTE — Assessment & Plan Note (Signed)
Increased and uncontrolled symptoms, has upocoming allergy appt

## 2020-08-16 NOTE — Addendum Note (Signed)
Addended by: Eual Fines on: 08/16/2020 01:54 PM   Modules accepted: Orders

## 2020-08-16 NOTE — Assessment & Plan Note (Signed)
resolved 

## 2020-09-15 ENCOUNTER — Other Ambulatory Visit: Payer: Self-pay

## 2020-09-15 ENCOUNTER — Ambulatory Visit: Payer: Medicare Other | Admitting: Allergy & Immunology

## 2020-09-15 VITALS — BP 126/82 | HR 88 | Resp 20

## 2020-09-15 DIAGNOSIS — J3089 Other allergic rhinitis: Secondary | ICD-10-CM | POA: Diagnosis not present

## 2020-09-15 DIAGNOSIS — R21 Rash and other nonspecific skin eruption: Secondary | ICD-10-CM

## 2020-09-15 DIAGNOSIS — K9049 Malabsorption due to intolerance, not elsewhere classified: Secondary | ICD-10-CM | POA: Diagnosis not present

## 2020-09-15 NOTE — Progress Notes (Signed)
NEW PATIENT  Date of Service/Encounter:  09/15/20  Referring provider: Fayrene Helper, MD   Assessment:   Rash  Food intolerance  Fibromyalgia   Ms. Arey's history certainly seems consistent with alpha gal allergy.  We did do testing to the entire environmental and food panel which was all negative.  I am surprised that her alpha gal panel was negative itself.  It is possible that we caught it too early before she seroconverted, but she was having symptoms as far back as 2018.  This was with the gel capsules, so maybe she has an isolated allergy to gelatin we will check with labs today.  We are also getting some labs to look for autoimmune disease.  However, she has a diagnosis of fibromyalgia so I assume these will all be negative.  I did recommend doing cetirizine twice daily when the rash next appears.  I would like to see her again as well so that I can lay eyes on this rash.  Plan/Recommendations:   1. Rash - with coexisting food intolerance - Testing was negative to the entire food and environmental panel. - Copy of testing results provided. - There is a the low positive predictive value of food allergy testing and hence the high possibility of false positives. - In contrast, food allergy testing has a high negative predictive value, therefore if testing is negative we can be relatively assured that they are indeed negative.  - Your history certainly sounds consistent with alpha gal syndrome. - Since you are doing well avoiding red meats, I would recommend continuing to do so. - I am going to get some labs to screen for autoimmune disease. - We will call you in 1-2 days with the results of the testing.  - Note any future triggers of this rash. - Take pictures of the rash next time as well (you can even call to see if we have openings so that we can see that in person).  - You can do cetirizine 10mg  twice daily when the rash next appears to see if this helps at all.    2. Return in about 3 months (around 12/16/2020).   Subjective:   Shirley Bush is a 65 y.o. female presenting today for evaluation of No chief complaint on file.   Shirley Bush has a history of the following: Patient Active Problem List   Diagnosis Date Noted  . Vaginal odor 07/02/2020  . Rash and nonspecific skin eruption 07/02/2020  . Tinea capitis 07/18/2019  . Alopecia 07/18/2019  . Ridged nails 07/18/2019  . Situational anxiety 05/18/2019  . Abdominal pain 08/01/2018  . Constipation 04/25/2018  . Change in bowel movement 01/23/2018  . FH: colon cancer in relative <71 years old 08/09/2017  . MDD (major depressive disorder), recurrent episode, moderate ([place]) 07/04/2017  . DJD (degenerative joint disease) of knee 11/11/2014  . Allergic rhinitis 07/19/2014  . Back pain with radiation 02/07/2013  . Fibromyalgia 04/05/2012  . Hyperlipidemia 02/11/2011  . FIBROIDS, UTERUS 03/31/2008  . NECK AND BACK PAIN 02/14/2008  . Essential hypertension 10/18/2007    History obtained from: chart review and patient.  Shirley Bush was referred by Fayrene Helper, MD.     Shirley Bush is a 65 y.o. female presenting for an evaluation of a rash.  She was taking vitamins at the end of 2018. She was taking vitamins that  Were soft gel and she was diagnosed with "inflamed intestines". She got antibiotics for this. She  thinks this might be related to gel capsules. She stopped using any soft gel capsules at all. This did help with her "inflammation".  She got a rash over her entire body around 2018. She reports that gluten is a trigger. This mostly comes in blister forms wherever she has hair. She has no pictures of the rash. This was over her legs, elbows and buttocks as well as her scalp.   She never got any blood work for this aside from an alpha gal panel that was negative and a celiac panel that was negative.  She really has no environmental allergy symptoms. She does have  postnasal drip with tonsillary stones. This happens every few weeks or so. She has used Sudafed with improvement in her symptoms. She does not use this on a routine basis.  She has never seen otolaryngology.  She has never had any sinus surgeries.  She does not have any asthma issues. She has a diagnosis of a heart murmur.  She described this as mild.  It is unclear whether she has seen cardiology.   Otherwise, there is no history of other atopic diseases, including asthma, food allergies, drug allergies, stinging insect allergies, eczema, urticaria or contact dermatitis. There is no significant infectious history. Vaccinations are up to date.    Past Medical History: Patient Active Problem List   Diagnosis Date Noted  . Vaginal odor 07/02/2020  . Rash and nonspecific skin eruption 07/02/2020  . Tinea capitis 07/18/2019  . Alopecia 07/18/2019  . Ridged nails 07/18/2019  . Situational anxiety 05/18/2019  . Abdominal pain 08/01/2018  . Constipation 04/25/2018  . Change in bowel movement 01/23/2018  . FH: colon cancer in relative <37 years old 08/09/2017  . MDD (major depressive disorder), recurrent episode, moderate ([place]) 07/04/2017  . DJD (degenerative joint disease) of knee 11/11/2014  . Allergic rhinitis 07/19/2014  . Back pain with radiation 02/07/2013  . Fibromyalgia 04/05/2012  . Hyperlipidemia 02/11/2011  . FIBROIDS, UTERUS 03/31/2008  . NECK AND BACK PAIN 02/14/2008  . Essential hypertension 10/18/2007    Medication List:  Allergies as of 09/15/2020      Reactions   Lisinopril Cough   Maxzide [hydrochlorothiazide W-triamterene] Itching   Rash   Other Rash   Buspar [buspirone]    Rash    Crestor [rosuvastatin Calcium] Rash      Medication List       Accurate as of September 15, 2020  5:06 PM. If you have any questions, ask your nurse or doctor.        Airborne [employer] 1 each by mouth daily.   cholecalciferol 25 MCG (1000 UNIT) tablet Commonly known as:  VITAMIN D3 Take 3,000 Units by mouth daily.   cloNIDine 0.2 MG tablet Commonly known as: Catapres Take 1 tablet (0.2 mg total) by mouth 2 (two) times daily.   Collagen Ultra Caps Take 1 each by mouth daily.   diphenhydrAMINE 25 MG tablet Commonly known as: BENADRYL Take 25 mg by mouth as needed.   GAS-X PO Take by mouth as needed.   Hair Skin and Nails Formula Tabs Take 3 tablets by mouth daily.   magnesium oxide 400 MG tablet Commonly known as: MAG-OX Take 1 tablet by mouth daily.   nystatin-triamcinolone ointment Commonly known as: MYCOLOG Apply twice daily to rash for 10 days, then as needed       Birth History: non-contributory  Developmental History: non-contributory  Past Surgical History: Past Surgical History:  Procedure Laterality Date  .  [place]  . CESAREAN SECTION  1980  . CESAREAN SECTION  1992  . CESAREAN SECTION N/A    Phreesia 06/28/2020  . COLONOSCOPY  02/05/2009  . COLONOSCOPY N/A 03/06/2014   Procedure: COLONOSCOPY;  Surgeon: Danie Binder, MD;  [facility];  Service: Endoscopy;  Laterality: N/A;  10:30 AM  . COLONOSCOPY WITH PROPOFOL N/A 01/14/2019   Procedure: COLONOSCOPY WITH PROPOFOL;  Surgeon: Danie Binder, MD;  [facility];  Service: Endoscopy;  Laterality: N/A;  2:00pm  . FINGER SURGERY Right    long finger  . HERNIA REPAIR N/A    Phreesia 06/28/2020  . JOINT REPLACEMENT N/A    Phreesia 06/28/2020  . KNEE ARTHROSCOPY WITH MEDIAL MENISECTOMY Left 04/24/2013   Procedure: LEFT KNEE ARTHROSCOPY CHONDROPLASTY WITH MEDIAL MENISECTOMY;  Surgeon: Ninetta Lights, MD;  Location: [facility];  Service: Orthopedics;  Laterality: Left;  . SHOULDER ARTHROSCOPY Left 2009  . SHOULDER ARTHROSCOPY Right   . SHOULDER ARTHROSCOPY WITH ROTATOR CUFF REPAIR Left 11/25/2004  . TOTAL KNEE ARTHROPLASTY Left 11/11/2014   Procedure: LEFT TOTAL KNEE ARTHROPLASTY;  Surgeon: Kathryne Hitch, MD;  Location: [facility];   Service: Orthopedics;  Laterality: Left;  . TUBAL LIGATION  1992  . UMBILICAL HERNIA REPAIR  2008     Family History: Family History  Problem Relation Age of Onset  . Hypertension Mother   . Hypertension Father   . Alcohol abuse Father   . Seizures Father   . Migraines Sister   . Thyroid disease Sister   . Colon cancer Sister        diagnosed in late 32s.   . Thyroid disease Sister   . Cancer Sister        breast   . Cancer Maternal Aunt        gyne cancer     Social History: Shirley Bush lives at home with family.  She lives in a house of an unknown age.  There is hardwood in the main living areas as well as the bedroom.  She has gas heating and window units and fans for cooling.  There are no animals inside or outside of the home.  She does not have dust mite covers on the bedding.  There is no tobacco exposure.  She is currently on disability for presumably for fibromyalgia.  There is no HEPA filter in the home.  She does not live near an interstate or industrial area.   Review of Systems  Constitutional: Positive for malaise/fatigue. Negative for chills, fever and weight loss.  HENT: Negative.  Negative for congestion, ear discharge, ear pain, sinus pain and sore throat.   Eyes: Negative for pain, discharge and redness.  Respiratory: Negative for cough, sputum production, shortness of breath, wheezing and stridor.   Cardiovascular: Negative.  Negative for chest pain and palpitations.  Gastrointestinal: Positive for constipation and nausea. Negative for abdominal pain, diarrhea, heartburn and vomiting.  Musculoskeletal: Positive for back pain, joint pain and neck pain.       Positive for fibromyalgia.  Skin: Positive for rash. Negative for itching.  Neurological: Negative for dizziness and headaches.  Endo/Heme/Allergies: Negative for environmental allergies. Does not bruise/bleed easily.       Objective:   Blood pressure 126/82, pulse 88, resp. rate 20, SpO2 97  %. There is no height or weight on file to calculate BMI.   Physical Exam:   Physical Exam Constitutional:      Appearance: She  is well-developed.     Comments: Talkative.  Flight of ideas at times.  HENT:     Head: Normocephalic and atraumatic.     Right Ear: Tympanic membrane, ear canal and external ear normal. No drainage, swelling or tenderness. Tympanic membrane is not injected, scarred, erythematous, retracted or bulging.     Left Ear: Tympanic membrane, ear canal and external ear normal. No drainage, swelling or tenderness. Tympanic membrane is not injected, scarred, erythematous, retracted or bulging.     Nose: No nasal deformity, septal deviation, mucosal edema, rhinorrhea or epistaxis.     Right Turbinates: Enlarged, swollen and pale.     Left Turbinates: Enlarged, swollen and pale.     Right Sinus: No maxillary sinus tenderness or frontal sinus tenderness.     Left Sinus: No maxillary sinus tenderness or frontal sinus tenderness.     Mouth/Throat:     Mouth: Oropharynx is clear and moist. Mucous membranes are not pale and not dry.     Pharynx: Uvula midline.  Eyes:     General:        Right eye: No discharge.        Left eye: No discharge.     Extraocular Movements: EOM normal.     Conjunctiva/sclera: Conjunctivae normal.     Right eye: Right conjunctiva is not injected. No chemosis.    Left eye: Left conjunctiva is not injected. No chemosis.    Pupils: Pupils are equal, round, and reactive to light.  Cardiovascular:     Rate and Rhythm: Normal rate and regular rhythm.     Heart sounds: Normal heart sounds.  Pulmonary:     Effort: Pulmonary effort is normal. No tachypnea, accessory muscle usage or respiratory distress.     Breath sounds: Normal breath sounds. No wheezing, rhonchi or rales.  Chest:     Chest wall: No tenderness.  Abdominal:     Tenderness: There is no abdominal tenderness. There is no guarding or rebound.  Lymphadenopathy:     Head:     Right  side of head: No submandibular, tonsillar or occipital adenopathy.     Left side of head: No submandibular, tonsillar or occipital adenopathy.     Cervical: No cervical adenopathy.  Skin:    General: Skin is warm.     Capillary Refill: Capillary refill takes less than 2 seconds.     Coloration: Skin is not pale.     Findings: No abrasion, erythema, petechiae or rash. Rash is not papular, urticarial or vesicular.     Comments: No eczematous or urticarial lesions noted.  Neurological:     Mental Status: She is alert.  Psychiatric:        Mood and Affect: Mood and affect normal.      Diagnostic studies:    Allergy Studies:     Airborne Adult Perc - 09/15/20 1516    Time Antigen Placed 1445    Allergen Manufacturer Lavella Hammock    Location Back    Number of Test 59    1. Control-Buffer 50% Glycerol Negative    2. Control-Histamine 1 mg/ml 2+   (stonger histamine)   3. Albumin saline Negative    4. [place] Negative    5. [place] Negative    6. [place] Negative    7. [place] Blue Negative    8. Meadow Fescue Negative    9. Perennial Rye Negative    10. Sweet Vernal Negative    11. Timothy Negative    12.  Cocklebur Negative    13. Burweed Marshelder Negative    14. Ragweed, short Negative    15. Ragweed, Giant Negative    16. Plantain,  English Negative    17. Lamb's Quarters Negative    18. Sheep Sorrell Negative    19. Rough Pigweed Negative    20. Marsh Elder, Rough Negative    21. Mugwort, Common Negative    22. Ash mix Negative    23. Birch mix Negative    24. Beech American Negative    25. Box, Elder Negative    26. Cedar, red Negative    27. Cottonwood, [place] Negative    28. Elm mix Negative    29. Hickory Negative    30. Maple mix Negative    31. Oak, [place] mix Negative    32. Pecan Pollen Negative    33. Pine mix Negative    34. Sycamore Eastern Negative    35. Greenwood, Black Pollen Negative    36. Alternaria alternata Negative    37. Cladosporium Herbarum  Negative    38. Aspergillus mix Negative    39. Penicillium mix Negative    40. Bipolaris sorokiniana (Helminthosporium) Negative    41. Drechslera spicifera (Curvularia) Negative    42. Mucor plumbeus Negative    43. Fusarium moniliforme Negative    44. Aureobasidium pullulans (pullulara) Negative    45. Rhizopus oryzae Negative    46. Botrytis cinera Negative    47. Epicoccum nigrum Negative    48. Phoma betae Negative    49. Candida Albicans Negative    50. Trichophyton mentagrophytes Negative    51. Mite, D Farinae  5,000 AU/ml Negative    52. Mite, D Pteronyssinus  5,000 AU/ml Negative    53. Cat Hair 10,000 BAU/ml Negative    54.  Dog Epithelia Negative    55. Mixed Feathers Negative    56. Horse Epithelia Negative    57. Cockroach, German Negative    58. Mouse Negative    59. Tobacco Leaf Negative          Food Adult Perc - 09/15/20 1500     Control-buffer 50% Glycerol Negative    Control-Histamine 1 mg/ml 2+   (stronger histamine)   1. Peanut Negative    2. Soybean Negative    3. Wheat Negative    4. Sesame Negative    5. Milk, cow Negative    6. Egg White, Chicken Negative    7. Casein Negative    8. Shellfish Mix Negative    9. Fish Mix Negative    10. Cashew Negative    11. Pecan Food Negative    12. [place] Negative    13. Almond Negative    14. Hazelnut Negative    15. [place] nut Negative    16. Coconut Negative    17. Pistachio Negative    18. Catfish Negative    19. Bass Negative    20. Trout Negative    21. Tuna Negative    22. Salmon Negative    23. Flounder Negative    24. Codfish Negative    25. Shrimp Negative    26. Crab Negative    27. Lobster Negative    28. Oyster Negative    29. Scallops Negative    30. Barley Negative    31. Oat  Negative    32. Rye  Negative    33. Hops Negative    34. Rice Negative    35. Cottonseed  Negative    36. Saccharomyces Cerevisiae  Negative    37. Pork Negative    38. [place] Meat Negative     39. Chicken Meat Negative    40. Beef Negative    41. Lamb Negative    42. Tomato Negative    43. White Potato Negative    44. Sweet Potato Negative    45. Pea, Green/English Negative    46. Navy Bean Negative    47. Mushrooms Negative    48. Avocado Negative    49. Onion Negative    50. Cabbage Negative    51. Carrots Negative    52. Celery Negative    53. Corn Negative    54. Cucumber Negative    55. Grape (White seedless) Negative    56. Orange  Negative    57. Banana Negative    58. Apple Negative    59. Peach Negative    60. Strawberry Negative    61. Cantaloupe Negative    62. Watermelon Negative    63. Pineapple Negative    64. Chocolate/Cacao bean Negative    65. Karaya Gum Negative    66. Acacia (Arabic Gum) Negative    67. Cinnamon Negative    68. Nutmeg Negative    69. Ginger Negative    70. Garlic Negative    71. Pepper, black Negative    72. Mustard Negative           Allergy testing results were read and interpreted by myself, documented by clinical staff.         Salvatore Marvel, MD [facility]

## 2020-09-15 NOTE — Patient Instructions (Addendum)
1. Rash - with coexisting food intolerance - Testing was negative to the entire food and environmental panel. - Copy of testing results provided. - There is a the low positive predictive value of food allergy testing and hence the high possibility of false positives. - In contrast, food allergy testing has a high negative predictive value, therefore if testing is negative we can be relatively assured that they are indeed negative.  - Your history certainly sounds consistent with alpha gal syndrome. - Since you are doing well avoiding red meats, I would recommend continuing to do so. - I am going to get some labs to screen for autoimmune disease. - We will call you in 1-2 days with the results of the testing.  - Note any future triggers of this rash. - Take pictures of the rash next time as well (you can even call to see if we have openings so that we can see that in person).  - You can do cetirizine 10mg  twice daily when the rash next appears to see if this helps at all.   2. Return in about 3 months (around 12/16/2020).    Please inform us of any Emergency Department visits, hospitalizations, or changes in symptoms. Call us before going to the ED for breathing or allergy symptoms since we might be able to fit you in for a sick visit. Feel free to contact us anytime with any questions, problems, or concerns.  It was a pleasure to meet you today!  Websites that have reliable patient information: 1. American Academy of Asthma, Allergy, and Immunology: www.aaaai.org 2. Food Allergy Research and Education (FARE): foodallergy.org 3. Mothers of Asthmatics: http://www.asthmacommunitynetwork.org 4. American College of Allergy, Asthma, and Immunology: www.acaai.org   COVID-19 Vaccine Information can be found at: [email or web address].[email or web address].uk For questions related to vaccine distribution or appointments, please email [email or web address] or call  [phone].   We realize that you might be concerned about having an allergic reaction to the COVID19 vaccines. To help with that concern, WE ARE OFFERING THE COVID19 VACCINES IN OUR OFFICE! Ask the front desk for dates!     "Like" [place] on Facebook and Instagram for our latest updates!      A healthy democracy works best when [employer] participate! Make sure you are registered to vote! If you have moved or changed any of your contact information, you will need to get this updated before voting!  In some cases, you MAY be able to register to vote online: [email or web address]      Airborne Adult Perc - 09/15/20 1516    Time Antigen Placed [place] Lavella Hammock    Location Back    Number of Test 59    1. Control-Buffer 50% Glycerol Negative    2. Control-Histamine 1 mg/ml 2+   (stonger histamine)   3. Albumin saline Negative    4. [place] Negative    5. [place] Negative    6. Johnson Negative    7. [place] Blue Negative    8. Meadow Fescue Negative    9. Perennial Rye Negative    10. Sweet Vernal Negative    11. Timothy Negative    12. Cocklebur Negative    13. Burweed Marshelder Negative    14. Ragweed, short Negative    15. Ragweed, Giant Negative    16. Plantain,  English Negative    17. Lamb's Quarters Negative    18. Sheep Sorrell Negative    19. Rough Pigweed Negative    20.  Marsh Elder, Rough Negative    21. Mugwort, Common Negative    22. Ash mix Negative    23. Birch mix Negative    24. Beech American Negative    25. Box, Elder Negative    26. Cedar, red Negative    27. Cottonwood, [place] Negative    28. Elm mix Negative    29. Hickory Negative    30. Maple mix Negative    31. Oak, [place] mix Negative    32. Pecan Pollen Negative    33. Pine mix Negative    34. Sycamore Eastern Negative    35. [place], Black Pollen Negative    36. Alternaria alternata Negative    37. Cladosporium Herbarum Negative    38.  Aspergillus mix Negative    39. Penicillium mix Negative    40. Bipolaris sorokiniana (Helminthosporium) Negative    41. Drechslera spicifera (Curvularia) Negative    42. Mucor plumbeus Negative    43. Fusarium moniliforme Negative    44. Aureobasidium pullulans (pullulara) Negative    45. Rhizopus oryzae Negative    46. Botrytis cinera Negative    47. Epicoccum nigrum Negative    48. Phoma betae Negative    49. Candida Albicans Negative    50. Trichophyton mentagrophytes Negative    51. Mite, D Farinae  5,000 AU/ml Negative    52. Mite, D Pteronyssinus  5,000 AU/ml Negative    53. Cat Hair 10,000 BAU/ml Negative    54.  Dog Epithelia Negative    55. Mixed Feathers Negative    56. Horse Epithelia Negative    57. Cockroach, German Negative    58. Mouse Negative    59. Tobacco Leaf Negative          Food Adult Perc - 09/15/20 1500     Control-buffer 50% Glycerol Negative    Control-Histamine 1 mg/ml 2+   (stronger histamine)   1. Peanut Negative    2. Soybean Negative    3. Wheat Negative    4. Sesame Negative    5. Milk, cow Negative    6. Egg White, Chicken Negative    7. Casein Negative    8. Shellfish Mix Negative    9. Fish Mix Negative    10. Cashew Negative    11. Pecan Food Negative    12. [place] Negative    13. Almond Negative    14. Hazelnut Negative    15. [place] nut Negative    16. Coconut Negative    17. Pistachio Negative    18. Catfish Negative    19. Bass Negative    20. Trout Negative    21. Tuna Negative    22. Salmon Negative    23. Flounder Negative    24. Codfish Negative    25. Shrimp Negative    26. Crab Negative    27. Lobster Negative    28. Oyster Negative    29. Scallops Negative    30. Barley Negative    31. Oat  Negative    32. Rye  Negative    33. Hops Negative    34. Rice Negative    35. Cottonseed Negative    36. Saccharomyces Cerevisiae  Negative    37. Pork Negative    38. [place] Meat Negative    39. Chicken Meat  Negative    40. Beef Negative    41. Lamb Negative    42. Tomato Negative    43. White Potato Negative  44. Sweet Potato Negative    45. Pea, Green/English Negative    46. Navy Bean Negative    47. Mushrooms Negative    48. Avocado Negative    49. Onion Negative    50. Cabbage Negative    51. Carrots Negative    52. Celery Negative    53. Corn Negative    54. Cucumber Negative    55. Grape (White seedless) Negative    56. Orange  Negative    57. Banana Negative    58. Apple Negative    59. Peach Negative    60. Strawberry Negative    61. Cantaloupe Negative    62. Watermelon Negative    63. Pineapple Negative    64. Chocolate/Cacao bean Negative    65. Karaya Gum Negative    66. Acacia (Arabic Gum) Negative    67. Cinnamon Negative    68. Nutmeg Negative    69. Ginger Negative    70. Garlic Negative    71. Pepper, black Negative    72. Mustard Negative

## 2020-09-17 ENCOUNTER — Encounter: Payer: Self-pay | Admitting: Allergy & Immunology

## 2020-09-20 LAB — RHEUMATOID FACTOR: Rheumatoid fact SerPl-aCnc: 11.1 IU/mL (ref <14.0)

## 2020-09-20 LAB — TRYPTASE: Tryptase: 3.8 ug/L (ref 2.2–13.2)

## 2020-09-20 LAB — THYROID ANTIBODIES
Thyroglobulin Antibody: <1 IU/mL (ref 0.0–0.9)
Thyroperoxidase Ab SerPl-aCnc: 27 IU/mL (ref 0–34)

## 2020-09-20 LAB — ANTINUCLEAR ANTIBODIES, IFA: ANA Titer 1: NEGATIVE

## 2020-09-20 LAB — ALLERGEN, WHEAT, F4: Wheat IgE: <0.1 kU/L

## 2020-09-20 LAB — C-REACTIVE PROTEIN: CRP: 4 mg/L (ref 0–10)

## 2020-09-20 LAB — SEDIMENTATION RATE: Sed Rate: 14 mm/hr (ref 0–40)

## 2020-09-21 NOTE — Progress Notes (Signed)
Referring Provider: Fayrene Helper, MD Primary Care Physician:  Fayrene Helper, MD Primary GI Physician: Dr. Abbey Chatters  Chief Complaint  Patient presents with  . Constipation  . Abdominal Pain    HPI:   Shirley Bush is a 64 y.o. female presenting today for follow-up. History of constipation with associated RLQ abdominal pain that improves with BMs. Softgel medications are known triggers of constipation.  TCS on 01/14/19 with one 3 mm tubular adenoma in the mid ascending colon, moderate diverticulosis in entire colon, external and internal hemorrhoids, tortuous left colon. Recommended repeat in 5 years.  Last seen in our office 07/19/2020.  She had recently been evaluated for pelvic pain by OB/GYN. Pelvic US unrevealing other than calcified leiomyomata throughout uterus. The fibroids are larger than reported on [place] in July 2019, but about the same as reported on CT in October 2019.  At the time of her office visit, she reported lower abdominal pain/back pain prior to BMs and during BMs that completely resolves thereafter.  No abdominal pain otherwise.  Symptoms occur when constipated or when bowels are soft, better or worse when constipated.  Constipated a couple times a week.  BMs incomplete.  Notes she previously felt much better after a colonoscopy in 2020 when she was "cleaned out".  Felt wheat and beef products worsened her symptoms.  Also discussed increased gas production which was worse with dairy products.  She was drinking lactose-free milk, but continued eating cheese frequently.  Previously tried fiber supplement but this worsened gas production.  She was working on weight loss with cutting out bread and decreasing soda intake.  Also reported intermittent rash that occurred on her arms, legs, and buttocks and wondered if certain food items are triggering this.  Plan to evaluate for celiac disease, alpha gal, start MiraLAX daily, avoid wheat, beef products, dairy products,  requested progress report in 2 weeks and follow-up in 2 months.  Consider imaging if symptoms not improving with better management of constipation or if abdominal pain worsens.  Celiac serologies were negative.  Alpha gal panel within normal limits.  Patient requested referral to allergist.  Patient stated that MiraLAX was causing frequent BMs.  Advised to reduce to one half capful daily.   Office visit with allergy and asthma 09/15/2020.  Testing was negative to entire food and environmental panel.  Patient reported doing well with avoiding red meats and she was advised to continue doing so.  Would also obtain some additional labs to evaluate for autoimmune processes.  Also recommended trial of cetirizine 10 mg twice daily when the rash next appears to see if this helps.   Additional testing with ANA negative, CRP and sed rate within normal limits, rheumatoid factor within normal limits, wheat IgE negative, thyroid antibodies within normal limits, tryptase within normal limits.  Today: Constipation: Bowels are moving well with dietary adjustments. Specifically, she has started eating more fruits on a daily basis. BMs daily to every other day. Good, complete BMs. Soft and formed. No brbpr or melena. Prior abdominal pain has resolved.   Bloating/gas improved quite a bit with the Lactaid tablets.  She ran out of Lactaid and tried Beano and simethicone, but felt these caused her to break out in a rash.   In general, she continues to have trouble with intermittent rash/bumps on her scalp, arms, legs, and perineal area.  She is not sure what is causing this.  She does report when she took Beano and simethicone, her  rash worsened.  Also reports her hair is breaking off.  She has follow-up with allergist in June.  Reports PCP previously prescribed her antibiotic (Flagyl) and her rash resolved.  She reports having her first shingles shot in November.  Also reports history of genital herpes when she was 20.  She  gets cold sores on her lips intermittently.  Chronic history of intermittent indigestion which is well controlled with Pepcid as needed. In general, she takes Pepcid a couple times a month.  No nausea or vomiting. Or dysphagia.   Past Medical History:  Diagnosis Date  . Anxiety   . Depression 2002  . Depression    Phreesia 06/28/2020  . Depression    Phreesia 07/23/2020  . Fibromyalgia   . Hypertension 2004  . Medial meniscus tear 04/2013   left  . Migraines     Past Surgical History:  Procedure Laterality Date  . [place]  . CESAREAN SECTION  1980  . CESAREAN SECTION  1992  . CESAREAN SECTION N/A    Phreesia 06/28/2020  . COLONOSCOPY  02/05/2009  . COLONOSCOPY N/A 03/06/2014   Procedure: COLONOSCOPY;  Surgeon: Danie Binder, MD;  Location[facility];  Service: Endoscopy;  Laterality: N/A;  10:30 AM  . COLONOSCOPY WITH PROPOFOL N/A 01/14/2019   Surgeon: Danie Binder, MD;  one 3 mm tubular adenoma in the mid ascending colon, moderate diverticulosis in entire colon, external and internal hemorrhoids, tortuous left colon. Recommended repeat in 5 years.   Marland Kitchen FINGER SURGERY Right    long finger  . HERNIA REPAIR N/A    Phreesia 06/28/2020  . JOINT REPLACEMENT N/A    Phreesia 06/28/2020  . KNEE ARTHROSCOPY WITH MEDIAL MENISECTOMY Left 04/24/2013   Procedure: LEFT KNEE ARTHROSCOPY CHONDROPLASTY WITH MEDIAL MENISECTOMY;  Surgeon: Ninetta Lights, MD;  Location: [place];  Service: Orthopedics;  Laterality: Left;  . SHOULDER ARTHROSCOPY Left 2009  . SHOULDER ARTHROSCOPY Right   . SHOULDER ARTHROSCOPY WITH ROTATOR CUFF REPAIR Left 11/25/2004  . TOTAL KNEE ARTHROPLASTY Left 11/11/2014   Procedure: LEFT TOTAL KNEE ARTHROPLASTY;  Surgeon: Kathryne Hitch, MD;  Location: [place];  Service: Orthopedics;  Laterality: Left;  . TUBAL LIGATION  1992  . UMBILICAL HERNIA REPAIR  2008    Current Outpatient Medications  Medication Sig Dispense Refill  .  cetirizine (ZYRTEC ALLERGY) 10 MG tablet Take 10 mg by mouth daily.    . cholecalciferol (VITAMIN D3) 25 MCG (1000 UNIT) tablet Take 3,000 Units by mouth daily.    . cloNIDine (CATAPRES) 0.2 MG tablet Take 1 tablet (0.2 mg total) by mouth 2 (two) times daily. 60 tablet 5  . diphenhydrAMINE (BENADRYL) 25 MG tablet Take 25 mg by mouth as needed.    . Misc Natural Products (AIRBORNE ELDERBERRY) CHEW Chew 1 each by mouth daily.    . Multiple Vitamins-Minerals (HAIR SKIN AND NAILS FORMULA) TABS Take 3 tablets by mouth daily.    Marland Kitchen nystatin-triamcinolone ointment (MYCOLOG) Apply twice daily to rash for 10 days, then as needed 60 g 0  . Specialty Vitamins Products (COLLAGEN ULTRA) CAPS Take 1 each by mouth daily.     No current facility-administered medications for this visit.    Allergies as of 09/23/2020 - Review Complete 09/23/2020  Allergen Reaction Noted  . Lisinopril Cough 06/26/2018  . Maxzide [hydrochlorothiazide w-triamterene] Itching 07/16/2019  . Other Rash 06/28/2020  . Buspar [buspirone]  07/16/2019  . Beano meltaways [alpha-d-galactosidase] Rash 09/23/2020  .  Crestor [rosuvastatin calcium] Rash 05/01/2019  . Simethicone Rash 09/23/2020    Family History  Problem Relation Age of Onset  . Hypertension Mother   . Hypertension Father   . Alcohol abuse Father   . Seizures Father   . Migraines Sister   . Thyroid disease Sister   . Colon cancer Sister        diagnosed in late 71s.   . Thyroid disease Sister   . Cancer Sister        breast   . Cancer Maternal Aunt        gyne cancer    Social History   Socioeconomic History  . Marital status: Divorced    Spouse name: Not on file  . Number of children: 3  . Years of education: 12 grade   . Highest education level: 12th grade  Occupational History  . Occupation: disability   Tobacco Use  . Smoking status: Never Smoker  . Smokeless tobacco: Never Used  Vaping Use  . Vaping Use: Never used  Substance and Sexual  Activity  . Alcohol use: Yes    Comment: occasionally- rarely.   . Drug use: No  . Sexual activity: Not Currently  Other Topics Concern  . Not on file  Social History Narrative   Son is living with her at the moment, patient states that she is isolating in her home and is depressed more lately    Social Determinants of Health   Financial Resource Strain: Low Risk   . Difficulty of Paying Living Expenses: Not very hard  Food Insecurity: Not on file  Transportation Needs: No Transportation Needs  . Lack of Transportation (Medical): No  . Lack of Transportation (Non-Medical): No  Physical Activity: Inactive  . Days of Exercise per Week: 0 days  . Minutes of Exercise per Session: 0 min  Stress: Stress Concern Present  . Feeling of Stress : To some extent  Social Connections: Socially Isolated  . Frequency of Communication with Friends and Family: Twice a week  . Frequency of Social Gatherings with Friends and Family: Once a week  . Attends Religious Services: Never  . Active Member of Clubs or Organizations: No  . Attends Archivist Meetings: Never  . Marital Status: Widowed    Review of Systems: Gen: Denies fever, chills, cold or flulike symptoms, lightheadedness, dizziness, presyncope, syncope. CV: Denies chest pain or palpitations. Resp: Denies dyspnea or cough. GI: See HPI Derm: See HPI Heme: See HPI  Physical Exam: BP (!) 159/90   Pulse 90   Temp 97.7 F (36.5 C)   Ht 5\' 3"  (1.6 m)   Wt 154 lb (69.9 kg)   BMI 27.28 kg/m  General:   Alert and oriented. No distress noted. Pleasant and cooperative.  Head:  Normocephalic and atraumatic. Few small bumps on her scalp that looks like small pustules.  Eyes:  Conjuctiva clear without scleral icterus. Heart:  S1, S2 present without murmurs appreciated. Lungs:  Clear to auscultation bilaterally. No wheezes, rales, or rhonchi. No distress.  Abdomen:  +BS, soft, non-tender and non-distended. No rebound or guarding.  No HSM or masses noted. Msk:  Symmetrical without gross deformities. Normal posture. Extremities:  Without edema. Skin: No bumps on visualized arms or lower legs today.  Neurologic:  Alert and  oriented x4 Psych: Normal mood and affect.   Assessment:  65 year old female with GI history of occasional indigestion that is managed with Pepcid as needed, bloating/increased gas, and constipation with associated  abdominal pain presenting today for follow-up.  Constipation is now well controlled with dietary modifications, specifically increasing daily fruit consumption.  Now with BMs daily to every other day that are productive, soft, and formed with no BRBPR or melena.  Additionally, previously reported abdominal pain has completely resolved.  Notably, colonoscopy is up-to-date in June 2020, due for repeat in 2025. She will continue with her current dietary modifications.  Bloating/gas: Significant improvement with Lactaid tablets prior to dairy consumption.  This was likely secondary to lactose intolerance.  Advise she continue Lactaid tablets prior to dairy.  Rash: Patient also reports ongoing intermittent rash on her scalp, arms, legs, and perineal area.  Previously, she felt this may have been affected by certain food items.  I completed celiac screen and alpha gal testing which was negative.  She has been referred to an allergist where testing was negative to entire food and environmental panel.  Additional testing was also ordered by allergist including ANA negative, CRP and sed rate within normal limits, rheumatoid factor within normal limits, wheat IgE negative, thyroid antibodies within normal limits, tryptase within normal limits.  More recently, patient reports she took Beano and simethicone and felt this caused her rash to worsen.  I advised her to avoid Beano and simethicone and follow-up with PCP and allergist for further evaluation.   Plan: 1.  Continue to eat plenty of fruits and  vegetables daily to keep bowels moving well. 2.  Continue Lactaid tablets prior to dairy consumption. 3.  Avoid Beano and simethicone. 4.  Follow-up with PCP and allergist for further evaluation of rash. 5.  Follow-up in 6 months.   Aliene Altes, PA-C [facility] 09/23/2020

## 2020-09-23 ENCOUNTER — Encounter: Payer: Self-pay | Admitting: Gastroenterology

## 2020-09-23 ENCOUNTER — Ambulatory Visit: Payer: Medicare Other | Admitting: Gastroenterology

## 2020-09-23 ENCOUNTER — Other Ambulatory Visit: Payer: Self-pay

## 2020-09-23 VITALS — BP 159/90 | HR 90 | Temp 97.7°F | Ht 63.0 in | Wt 154.0 lb

## 2020-09-23 DIAGNOSIS — K5900 Constipation, unspecified: Secondary | ICD-10-CM

## 2020-09-23 DIAGNOSIS — R1030 Lower abdominal pain, unspecified: Secondary | ICD-10-CM | POA: Diagnosis not present

## 2020-09-23 DIAGNOSIS — R21 Rash and other nonspecific skin eruption: Secondary | ICD-10-CM | POA: Diagnosis not present

## 2020-09-23 DIAGNOSIS — R143 Flatulence: Secondary | ICD-10-CM

## 2020-09-23 NOTE — Patient Instructions (Signed)
Continue eating plenty of fruits and vegetables daily to keep your bowels moving well.  Continue using lactacid tablets prior to dairy consumption as this has helped your gas/bloating.    Call primary care and your allergist to follow-up on your rash that continues to come and go.   Avoid beano and simethicone as you feel these items worsened your rash.   We will plan to follow-up in 6 months. Do not hesitate to call with questions or concerns prior.   It was good to see you today!   Aliene Altes, PA-C [facility]

## 2020-09-24 NOTE — Progress Notes (Signed)
Cc'ed to pcp °

## 2020-09-27 ENCOUNTER — Other Ambulatory Visit: Payer: Self-pay

## 2020-09-27 ENCOUNTER — Telehealth (INDEPENDENT_AMBULATORY_CARE_PROVIDER_SITE_OTHER): Payer: Medicare Other | Admitting: Family Medicine

## 2020-09-27 ENCOUNTER — Encounter: Payer: Self-pay | Admitting: Family Medicine

## 2020-09-27 VITALS — Ht 63.0 in | Wt 154.0 lb

## 2020-09-27 DIAGNOSIS — Z13220 Encounter for screening for lipoid disorders: Secondary | ICD-10-CM

## 2020-09-27 DIAGNOSIS — N761 Subacute and chronic vaginitis: Secondary | ICD-10-CM

## 2020-09-27 DIAGNOSIS — E785 Hyperlipidemia, unspecified: Secondary | ICD-10-CM

## 2020-09-27 DIAGNOSIS — I10 Essential (primary) hypertension: Secondary | ICD-10-CM

## 2020-09-27 DIAGNOSIS — R252 Cramp and spasm: Secondary | ICD-10-CM

## 2020-09-27 DIAGNOSIS — J309 Allergic rhinitis, unspecified: Secondary | ICD-10-CM | POA: Diagnosis not present

## 2020-09-27 MED ORDER — CHLORPHENIRAMINE MALEATE 4 MG PO TABS: 4.0000 mg | ORAL_TABLET | Freq: Two times a day (BID) | ORAL | 0 refills | Status: DC | PRN

## 2020-09-27 MED ORDER — FLUTICASONE PROPIONATE 50 MCG/ACT NA SUSP: 2.0000 | Freq: Every day | NASAL | 6 refills | Status: DC

## 2020-09-27 MED ORDER — FAMCICLOVIR 500 MG PO TABS: 500.0000 mg | ORAL_TABLET | Freq: Three times a day (TID) | ORAL | 0 refills | Status: DC

## 2020-09-27 NOTE — Progress Notes (Signed)
Virtual Visit via Telephone Note  I connected with Shirley Bush on 09/27/20 at  9:40 AM EDT by telephone and verified that I am speaking with the correct person using two identifiers.  Location: Patient:: home Provider: office   I discussed the limitations, risks, security and privacy concerns of performing an evaluation and management service by telephone and the availability of in person appointments. I also discussed with the patient that there may be a patient responsible charge related to this service. The patient expressed understanding and agreed to proceed.   History of Present Illness: 1 week h/o vaginal itch and burning esp after  Stopping benadryl, has had herpes in the past 2 week h/o head pressure, clear drainage, mucinex has helped, no fevr or chills, no cough Does not feel "ill" attributes this to allergies Continued stress caring for aging and ill Mom Denies recent fever or chills. Denies sinus pressure, nasal congestion, ear pain or sore throat. Denies chest congestion, productive cough or wheezing. Denies chest pains, palpitations and leg swelling Denies abdominal pain, nausea, vomiting,diarrhea or constipation.   Denies dysuria, frequency, hesitancy or incontinence. Denies joint pain, swelling and limitation in mobility. Denies seizures, numbness, or tinglinc/o intermittent headacheg. Denies uncontrolled depression, anxiety or insomnia.        Observations/Objective: Ht 5\' 3"  (1.6 m)   Wt 154 lb (69.9 kg)   BMI 27.28 kg/m  Good communication with no confusion and intact memory. Alert and oriented x 3 No signs of respiratory distress during speech    Assessment and Plan: Allergic rhinitis Uncontrolled add daily flonase and as needed chlorpheniramine. Encouraged saline flushes up to twice daily  Essential hypertension DASH diet and commitment to daily physical activity for a minimum of 30 minutes discussed and encouraged, as a part of hypertension  management. The importance of attaining a healthy weight is also discussed. Generally not at goal and intolerant of med changes, multiple have been attempted  BP/Weight 09/27/2020 09/23/2020 09/15/2020 08/16/2020 07/19/2020 07/01/2020 6/59/9357  Systolic BP - 017 793 903 009 233 007  Diastolic BP - 90 82 83 96 100 88  Wt. (Lbs) 154 154 - 154 152.4 153.4 161.2  BMI 27.28 27.28 - 27.28 27 27.17 28.56  Some encounter information is confidential and restricted. Go to Review Flowsheets activity to see all data.       Hyperlipidemia Hyperlipidemia:Low fat diet discussed and encouraged.   Lipid Panel  Lab Results  Component Value Date   CHOL 274 (H) 07/01/2020   HDL 59 07/01/2020   LDLCALC 190 (H) 07/01/2020   TRIG 137 07/01/2020   CHOLHDL 4.6 (H) 07/01/2020     Uncontrolled ar inc HD risk, needs to change diet, intolerant of statins   Vaginitis Sym[ptomatic with itch and h/o herpes genital infection, trial of famvir x 1 week    Follow Up Instructions:    I discussed the assessment and treatment plan with the patient. The patient was provided an opportunity to ask questions and all were answered. The patient agreed with the plan and demonstrated an understanding of the instructions.   The patient was advised to call back or seek an in-person evaluation if the symptoms worsen or if the condition fails to improve as anticipated.  I provided 14 minutes of non-face-to-face time during this encounter.   Tula Nakayama, MD

## 2020-09-27 NOTE — Patient Instructions (Addendum)
F/U in office in 3.5 months, call if you need me sooner  Fasting lipid and chem 7 and EGFR 5 days before next visit  New for allergies is flonase, continue claritin as before  New decongestant is chlorpheniramine , one daily as needed, for excess drainage  1 week Famvir is prescribed for  Itch   Heart-Healthy Eating Plan Heart-healthy meal planning includes:  Eating less unhealthy fats.  Eating more healthy fats.  Making other changes in your diet. Talk with your doctor or a diet specialist (dietitian) to create an eating plan that is right for you. What is my plan? Your doctor may recommend an eating plan that includes:  Total fat: ______% or less of total calories a day.  Saturated fat: ______% or less of total calories a day.  Cholesterol: less than _________mg a day. What are tips for following this plan? Cooking Avoid frying your food. Try to bake, boil, grill, or broil it instead. You can also reduce fat by:  Removing the skin from poultry.  Removing all visible fats from meats.  Steaming vegetables in water or broth. Meal planning  At meals, divide your plate into four equal parts: ? Fill one-half of your plate with vegetables and green salads. ? Fill one-fourth of your plate with whole grains. ? Fill one-fourth of your plate with lean protein foods.  Eat 4-5 servings of vegetables per day. A serving of vegetables is: ? 1 cup of raw or cooked vegetables. ? 2 cups of raw leafy greens.  Eat 4-5 servings of fruit per day. A serving of fruit is: ? 1 medium whole fruit. ?  cup of dried fruit. ?  cup of fresh, frozen, or canned fruit. ?  cup of 100% fruit juice.  Eat more foods that have soluble fiber. These are apples, broccoli, carrots, beans, peas, and barley. Try to get 20-30 g of fiber per day.  Eat 4-5 servings of nuts, legumes, and seeds per week: ? 1 serving of dried beans or legumes equals  cup after being cooked. ? 1 serving of nuts is   cup. ? 1 serving of seeds equals 1 tablespoon.   General information  Eat more home-cooked food. Eat less restaurant, buffet, and fast food.  Limit or avoid alcohol.  Limit foods that are high in starch and sugar.  Avoid fried foods.  Lose weight if you are overweight.  Keep track of how much salt (sodium) you eat. This is important if you have high blood pressure. Ask your doctor to tell you more about this.  Try to add vegetarian meals each week. Fats  Choose healthy fats. These include olive oil and canola oil, flaxseeds, walnuts, almonds, and seeds.  Eat more omega-3 fats. These include salmon, mackerel, sardines, tuna, flaxseed oil, and ground flaxseeds. Try to eat fish at least 2 times each week.  Check food labels. Avoid foods with trans fats or high amounts of saturated fat.  Limit saturated fats. ? These are often found in animal products, such as meats, butter, and cream. ? These are also found in plant foods, such as palm oil, palm kernel oil, and coconut oil.  Avoid foods with partially hydrogenated oils in them. These have trans fats. Examples are stick margarine, some tub margarines, cookies, crackers, and other baked goods. What foods can I eat? Fruits All fresh, canned (in natural juice), or frozen fruits. Vegetables Fresh or frozen vegetables (raw, steamed, roasted, or grilled). Green salads. Grains Most grains. Choose whole wheat  and whole grains most of the time. Rice and pasta, including brown rice and pastas made with whole wheat. Meats and other proteins Lean, well-trimmed beef, veal, pork, and lamb. Chicken and [place] without skin. All fish and shellfish. Wild duck, rabbit, pheasant, and venison. Egg whites or low-cholesterol egg substitutes. Dried beans, peas, lentils, and tofu. Seeds and most nuts. Dairy Low-fat or nonfat cheeses, including ricotta and mozzarella. Skim or 1% milk that is liquid, powdered, or evaporated. Buttermilk that is made with  low-fat milk. Nonfat or low-fat yogurt. Fats and oils Non-hydrogenated (trans-free) margarines. Vegetable oils, including soybean, sesame, sunflower, olive, peanut, safflower, corn, canola, and cottonseed. Salad dressings or mayonnaise made with a vegetable oil. Beverages Mineral water. Coffee and tea. Diet carbonated beverages. Sweets and desserts Sherbet, gelatin, and fruit ice. Small amounts of dark chocolate. Limit all sweets and desserts. Seasonings and condiments All seasonings and condiments. The items listed above may not be a complete list of foods and drinks you can eat. Contact a dietitian for more options. What foods should I avoid? Fruits Canned fruit in heavy syrup. Fruit in cream or butter sauce. Fried fruit. Limit coconut. Vegetables Vegetables cooked in cheese, cream, or butter sauce. Fried vegetables. Grains Breads that are made with saturated or trans fats, oils, or whole milk. Croissants. Sweet rolls. Donuts. High-fat crackers, such as cheese crackers. Meats and other proteins Fatty meats, such as hot dogs, ribs, sausage, bacon, rib-eye roast or steak. High-fat deli meats, such as salami and bologna. Caviar. Domestic duck and goose. Organ meats, such as liver. Dairy Cream, sour cream, cream cheese, and creamed cottage cheese. Whole-milk cheeses. Whole or 2% milk that is liquid, evaporated, or condensed. Whole buttermilk. Cream sauce or high-fat cheese sauce. Yogurt that is made from whole milk. Fats and oils Meat fat, or shortening. Cocoa butter, hydrogenated oils, palm oil, coconut oil, palm kernel oil. Solid fats and shortenings, including bacon fat, salt pork, lard, and butter. Nondairy cream substitutes. Salad dressings with cheese or sour cream. Beverages Regular sodas and juice drinks with added sugar. Sweets and desserts Frosting. Pudding. Cookies. Cakes. Pies. Milk chocolate or white chocolate. Buttered syrups. Full-fat ice cream or ice cream drinks. The items  listed above may not be a complete list of foods and drinks to avoid. Contact a dietitian for more information. Summary  Heart-healthy meal planning includes eating less unhealthy fats, eating more healthy fats, and making other changes in your diet.  Eat a balanced diet. This includes fruits and vegetables, low-fat or nonfat dairy, lean protein, nuts and legumes, whole grains, and heart-healthy oils and fats. This information is not intended to replace advice given to you by your health care provider. Make sure you discuss any questions you have with your health care provider. Document Revised: 09/06/2017 Document Reviewed: 08/10/2017 Elsevier Patient Education  2021 [employer].

## 2020-10-01 ENCOUNTER — Encounter: Payer: Self-pay | Admitting: Family Medicine

## 2020-10-01 NOTE — Assessment & Plan Note (Signed)
Uncontrolled add daily flonase and as needed chlorpheniramine. Encouraged saline flushes up to twice daily

## 2020-10-01 NOTE — Assessment & Plan Note (Signed)
Sym[ptomatic with itch and h/o herpes genital infection, trial of famvir x 1 week

## 2020-10-01 NOTE — Assessment & Plan Note (Signed)
DASH diet and commitment to daily physical activity for a minimum of 30 minutes discussed and encouraged, as a part of hypertension management. The importance of attaining a healthy weight is also discussed. Generally not at goal and intolerant of med changes, multiple have been attempted  BP/Weight 09/27/2020 09/23/2020 09/15/2020 08/16/2020 07/19/2020 07/01/2020 2/72/5366  Systolic BP - 440 347 425 956 387 564  Diastolic BP - 90 82 83 96 100 88  Wt. (Lbs) 154 154 - 154 152.4 153.4 161.2  BMI 27.28 27.28 - 27.28 27 27.17 28.56  Some encounter information is confidential and restricted. Go to Review Flowsheets activity to see all data.

## 2020-10-01 NOTE — Assessment & Plan Note (Signed)
Hyperlipidemia:Low fat diet discussed and encouraged.   Lipid Panel  Lab Results  Component Value Date   CHOL 274 (H) 07/01/2020   HDL 59 07/01/2020   LDLCALC 190 (H) 07/01/2020   TRIG 137 07/01/2020   CHOLHDL 4.6 (H) 07/01/2020     Uncontrolled ar inc HD risk, needs to change diet, intolerant of statins

## 2020-10-14 DIAGNOSIS — H2513 Age-related nuclear cataract, bilateral: Secondary | ICD-10-CM | POA: Diagnosis not present

## 2020-10-14 DIAGNOSIS — H43393 Other vitreous opacities, bilateral: Secondary | ICD-10-CM | POA: Diagnosis not present

## 2020-10-14 DIAGNOSIS — H43813 Vitreous degeneration, bilateral: Secondary | ICD-10-CM | POA: Diagnosis not present

## 2020-10-14 DIAGNOSIS — H43392 Other vitreous opacities, left eye: Secondary | ICD-10-CM | POA: Diagnosis not present

## 2020-10-14 DIAGNOSIS — H35463 Secondary vitreoretinal degeneration, bilateral: Secondary | ICD-10-CM | POA: Diagnosis not present

## 2020-10-18 NOTE — Patient Instructions (Addendum)
1. Rash - with coexisting food intolerance - Testing on 09/15/2020 was negative to the entire food and environmental panel.  -You can try adding back red meats /gelatin medications since you are having this rash and avoiding these items. -If your symptoms re-occur, begin a journal of events that occurred for up to 6 hours before your symptoms began including foods and beverages consumed, soaps or perfumes you had contact with, and medications.  - Continue Claritin (loratadine) 10mg  twice daily  -We will refer you to [facility] to due rash/itching on scalp and questions about alopecia -Schedule an appointment with gynecology to discuss rash on vagina due to her history of genital warts  Your blood pressure was elevated at today's appointment. Schedule an appointment with your primary care physician to discuss your elevated blood pressure.  2. Schedule a follow up appointment as needed

## 2020-10-20 ENCOUNTER — Other Ambulatory Visit: Payer: Self-pay

## 2020-10-20 ENCOUNTER — Ambulatory Visit: Payer: Medicare Other | Admitting: Family

## 2020-10-20 ENCOUNTER — Encounter: Payer: Self-pay | Admitting: Family

## 2020-10-20 VITALS — BP 160/92 | HR 90 | Temp 98.6°F | Resp 17 | Ht 63.0 in | Wt 157.8 lb

## 2020-10-20 DIAGNOSIS — R21 Rash and other nonspecific skin eruption: Secondary | ICD-10-CM

## 2020-10-20 NOTE — Progress Notes (Signed)
[address], [place] [address] Dept: [phone]  FOLLOW UP NOTE  Patient ID: Shirley Bush, female    DOB: 06-Dec-1955  Age: 65 y.o. MRN: [id number] Date of Office Visit: 10/20/2020  Assessment  Chief Complaint: Rash  HPI Shirley Bush is a 65 year old female who presents today for an acute visit.  She was last seen on September 15, 2020 by Dr. Ernst Bowler for rash with existing of food intolerance.  She reports that a few weeks ago her rash started to come back.  This has been ongoing since at least 2019.  The rash comes and goes in places where hair is located.  She mentions that her primary care physician gave her famciclovir and the rash did not completely go away while on this.  She reports that the rash is mainly on her head and her vagina.  She does mention that in the past she has had a history of genital warts and has not seen her gynecologist since either 2018 or 2019.  Last time she saw dermatology for the rash on her head was in 2021 and she was given fluocinolone acetonide and this did not help any.  She mentions that her hair is breaking off and she is losing hair.  She is wondering if she was checked for alopecia.  She reports that she has never been crazy about eating red meat.  She has mainly been eating chicken, [place], and fish.  She is also not taking gelatin medications.  Even with avoiding these items the rash still came back.  Her alpha gal lab level from July 19, 2020 was negative.  Her lab work looking for causes of rash/itch was unrevealing.  She denies any new foods, new new medications.  She does mention that she was placed on clonidine back in December 2020.  She is not been on any new products, was not sick prior to this starting and denies any bruising.  She mentions that she has fibromyalgia and it is normal for her to have joint pain.  She denies any fever, or insect stings.  She is wondering if there is any test that can be done to see if she  is allergic to any of the medication she is taking.   Drug Allergies:  Allergies  Allergen Reactions  . Lisinopril Cough  . Maxzide [Hydrochlorothiazide W-Triamterene] Itching    Rash  . Other Rash  . Buspar [Buspirone]     Rash    . Beano Meltaways [Alpha-D-Galactosidase] Rash  . Crestor [Rosuvastatin Calcium] Rash  . Simethicone Rash    Review of Systems: Review of Systems  Constitutional: Negative for chills and fever.  HENT:       Denies rhinorrhea, nasal congestion, postnasal drip  Eyes:       Denies itchy watery eyes  Respiratory: Negative for cough, shortness of breath and wheezing.   Cardiovascular: Negative for chest pain and palpitations.  Gastrointestinal: Negative for heartburn.  Genitourinary: Negative for dysuria.  Skin: Positive for itching and rash.  Neurological: Positive for headaches.       She reports headaches and that this comes along with her fibromyalgia  Endo/Heme/Allergies: Negative for environmental allergies.    Physical Exam: BP (!) 160/92 (BP Location: Right Arm, Patient Position: Sitting, Cuff Size: Normal)   Pulse 90   Temp 98.6 F (37 C) (Temporal)   Resp 17   Ht 5\' 3"  (1.6 m)   Wt 157 lb 12.8 oz (71.6 kg)  SpO2 99%   BMI 27.95 kg/m    Physical Exam Constitutional:      Appearance: Normal appearance.  HENT:     Head: Atraumatic.     Comments: Pharynx normal, eyes normal, ears normal, nose normal    Right Ear: Tympanic membrane, ear canal and external ear normal.     Left Ear: Tympanic membrane, ear canal and external ear normal.     Nose: Nose normal.     Mouth/Throat:     Mouth: Mucous membranes are moist.     Pharynx: Oropharynx is clear.  Eyes:     Conjunctiva/sclera: Conjunctivae normal.  Cardiovascular:     Rate and Rhythm: Regular rhythm.     Heart sounds: Normal heart sounds.  Pulmonary:     Effort: Pulmonary effort is normal.     Breath sounds: Normal breath sounds.     Comments: Lungs clear to  auscultation Musculoskeletal:     Cervical back: Neck supple.  Skin:    General: Skin is warm.     Comments: Dry skin noted on bilateral forearms.  No rashes or urticarial lesions noted on scalp or exposed skin.  Vaginal exam deferred.  Instructed to make an appointment with her gynecologist.  Neurological:     Mental Status: She is alert and oriented to person, place, and time.  Psychiatric:        Mood and Affect: Mood normal.        Behavior: Behavior normal.        Thought Content: Thought content normal.        Judgment: Judgment normal.     Diagnostics: None  Assessment and Plan: 1. Rash     No orders of the defined types were placed in this encounter.   Patient Instructions   1. Rash - with coexisting food intolerance - Testing on 09/15/2020 was negative to the entire food and environmental panel.  -You can try adding back red meats /gelatin medications since you are having this rash and avoiding these items. -If your symptoms re-occur, begin a journal of events that occurred for up to 6 hours before your symptoms began including foods and beverages consumed, soaps or perfumes you had contact with, and medications.  - Continue Claritin (loratadine) 10mg  twice daily  -We will refer you to [facility] to due rash/itching on scalp and questions about alopecia -Schedule an appointment with gynecology to discuss rash on vagina due to her history of genital warts  Your blood pressure was elevated at today's appointment. Schedule an appointment with your primary care physician to discuss your elevated blood pressure.  2. Schedule a follow up appointment as needed         Return if symptoms worsen or fail to improve.    Thank you for the opportunity to care for this patient.  Please do not hesitate to contact me with questions.  Althea Charon, FNP [facility]

## 2020-10-21 ENCOUNTER — Telehealth: Payer: Self-pay | Admitting: Family

## 2020-10-21 NOTE — Telephone Encounter (Signed)
-----   Message from Althea Charon, [place] sent at 10/20/2020  1:14 PM EDT ----- Refer to  [facility] dermatology due to rash on scalp/ hair loss

## 2020-10-21 NOTE — Telephone Encounter (Signed)
Referral faxed to [facility] along with notes and demographics. Patient informed they will reach out to her to schedule and she verbalized understanding.

## 2020-11-02 ENCOUNTER — Other Ambulatory Visit: Payer: Self-pay

## 2020-11-02 ENCOUNTER — Ambulatory Visit (INDEPENDENT_AMBULATORY_CARE_PROVIDER_SITE_OTHER): Payer: Medicare Other | Admitting: Family Medicine

## 2020-11-02 ENCOUNTER — Encounter: Payer: Self-pay | Admitting: Family Medicine

## 2020-11-02 VITALS — BP 180/90 | HR 84 | Resp 16 | Ht 63.0 in | Wt 157.0 lb

## 2020-11-02 DIAGNOSIS — I10 Essential (primary) hypertension: Secondary | ICD-10-CM | POA: Diagnosis not present

## 2020-11-02 DIAGNOSIS — L659 Nonscarring hair loss, unspecified: Secondary | ICD-10-CM

## 2020-11-02 DIAGNOSIS — R21 Rash and other nonspecific skin eruption: Secondary | ICD-10-CM

## 2020-11-02 DIAGNOSIS — L609 Nail disorder, unspecified: Secondary | ICD-10-CM | POA: Diagnosis not present

## 2020-11-02 DIAGNOSIS — F331 Major depressive disorder, recurrent, moderate: Secondary | ICD-10-CM

## 2020-11-02 MED ORDER — HYDROXYZINE HCL 10 MG PO TABS: 10.0000 mg | ORAL_TABLET | Freq: Three times a day (TID) | ORAL | 0 refills | Status: DC | PRN

## 2020-11-02 MED ORDER — HYDRALAZINE HCL 25 MG PO TABS: 25.0000 mg | ORAL_TABLET | Freq: Three times a day (TID) | ORAL | 1 refills | Status: DC

## 2020-11-02 MED ORDER — CLONIDINE HCL 0.3 MG PO TABS: 0.3000 mg | ORAL_TABLET | Freq: Two times a day (BID) | ORAL | 2 refills | Status: DC

## 2020-11-02 MED ORDER — PAROXETINE HCL 20 MG PO TABS: 20.0000 mg | ORAL_TABLET | Freq: Every day | ORAL | 3 refills | Status: DC

## 2020-11-02 NOTE — Assessment & Plan Note (Signed)
1 year history needs derm eval

## 2020-11-02 NOTE — Assessment & Plan Note (Signed)
1 year history, derm eval

## 2020-11-02 NOTE — Progress Notes (Signed)
   Shirley Bush     MRN: [id number]      DOB: 09-09-1955   HPI Shirley Bush is here for follow up and re-evaluation of chronic medical conditions, medication management and review of any available recent lab and radiology data.  Preventive health is updated, specifically  Cancer screening and Immunization.   Questions or concerns regarding consultations or procedures which the PT has had in the interim are  addressed. C/o increased stress and anxiety is depressed  ROS Denies recent fever or chills. Denies sinus pressure, nasal congestion, ear pain or sore throat. Denies chest congestion, productive cough or wheezing. Denies chest pains, palpitations and leg swelling Denies abdominal pain, nausea, vomiting,diarrhea or constipation.   Denies dysuria, frequency, hesitancy or incontinence. Denies joint pain, swelling and limitation in mobility. Denies headaches, seizures, numbness, or tingling. . Denies skin break down or rash.   PE  BP (!) 185/107   Pulse 84   Resp 16   Ht 5\' 3"  (1.6 m)   Wt 157 lb (71.2 kg)   SpO2 98%   BMI 27.81 kg/m   Patient alert and oriented and in no cardiopulmonary distress.  HEENT: No facial asymmetry, EOMI,     Neck supple .  Chest: Clear to auscultation bilaterally.  CVS: S1, S2 no murmurs, no S3.Regular rate.  ABD: Soft non tender.   Ext: No edema  MS: Adequate ROM spine, shoulders, hips and knees.  Skin: Intact, no ulcerations or rash noted.  Psych: Good eye contact, normal affect. Memory intact not anxious or depressed appearing.  CNS: CN 2-12 intact, power,  normal throughout.no focal deficits noted.   Assessment & Plan  Rash and nonspecific skin eruption 1 year history of inermittent rash, needs dermtology  Ridged nails 1 year history needs derm eval  Alopecia 1 year history, derm eval  Essential hypertension Uncontrolled, new is hydralazine tid DASH diet and commitment to daily physical activity for a minimum of 30  minutes discussed and encouraged, as a part of hypertension management. The importance of attaining a healthy weight is also discussed.  BP/Weight 11/02/2020 10/20/2020 09/27/2020 09/23/2020 09/15/2020 5/73/2202 11/17/2704  Systolic BP 237 628 - 315 176 160 737  Diastolic BP 90 92 - 90 82 83 96  Wt. (Lbs) 157 157.8 154 154 - 154 152.4  BMI 27.81 27.95 27.28 27.28 - 27.28 27  Some encounter information is confidential and restricted. Go to Review Flowsheets activity to see all data.       MDD (major depressive disorder), recurrent episode, moderate (HCC) PHQ 9 is 13 and anxiety very high, start paxil and hydroxyzine refer therapy

## 2020-11-02 NOTE — Assessment & Plan Note (Signed)
1 year history of inermittent rash, needs dermtology

## 2020-11-02 NOTE — Patient Instructions (Addendum)
F/U in 5  Weeks, call if you need me sooner  You are referred to dermatology  New for anxiety and depression is paxil and hydroxyzine  New fopr blood pressure is hydralazine 25 mg three times daily, take 8 hours apart example 7am, 3 pm and 11pm  I will refer you to therapist also as we discussed  Thanks for choosing [facility], we consider it a privelige to serve you.   It is important that you exercise regularly at least 30 minutes 5 times a week. If you develop chest pain, have severe difficulty breathing, or feel very tired, stop exercising immediately and seek medical attention

## 2020-11-02 NOTE — Assessment & Plan Note (Addendum)
Uncontrolled, new is hydralazine tid DASH diet and commitment to daily physical activity for a minimum of 30 minutes discussed and encouraged, as a part of hypertension management. The importance of attaining a healthy weight is also discussed.  BP/Weight 11/02/2020 10/20/2020 09/27/2020 09/23/2020 09/15/2020 08/30/863 01/22/4695  Systolic BP 295 284 - 132 440 102 725  Diastolic BP 90 92 - 90 82 83 96  Wt. (Lbs) 157 157.8 154 154 - 154 152.4  BMI 27.81 27.95 27.28 27.28 - 27.28 27  Some encounter information is confidential and restricted. Go to Review Flowsheets activity to see all data.

## 2020-11-02 NOTE — Assessment & Plan Note (Signed)
PHQ 9 is 13 and anxiety very high, start paxil and hydroxyzine refer therapy

## 2020-11-25 DIAGNOSIS — L658 Other specified nonscarring hair loss: Secondary | ICD-10-CM | POA: Diagnosis not present

## 2020-12-02 NOTE — Telephone Encounter (Signed)
[facility] to check the status of referral. Was told that they called twice, but the number on file was incorrect. Gave them emergency contact for patient's daughter.

## 2020-12-07 ENCOUNTER — Other Ambulatory Visit: Payer: Self-pay

## 2020-12-07 ENCOUNTER — Ambulatory Visit (INDEPENDENT_AMBULATORY_CARE_PROVIDER_SITE_OTHER): Payer: Medicare Other | Admitting: Family Medicine

## 2020-12-07 ENCOUNTER — Encounter: Payer: Self-pay | Admitting: Family Medicine

## 2020-12-07 VITALS — BP 192/105 | HR 99 | Resp 16 | Ht 63.0 in | Wt 156.4 lb

## 2020-12-07 DIAGNOSIS — R079 Chest pain, unspecified: Secondary | ICD-10-CM

## 2020-12-07 DIAGNOSIS — I10 Essential (primary) hypertension: Secondary | ICD-10-CM

## 2020-12-07 DIAGNOSIS — F331 Major depressive disorder, recurrent, moderate: Secondary | ICD-10-CM | POA: Diagnosis not present

## 2020-12-07 MED ORDER — AMLODIPINE BESYLATE 5 MG PO TABS: 5.0000 mg | ORAL_TABLET | Freq: Every day | ORAL | 3 refills | Status: DC

## 2020-12-07 MED ORDER — HYDRALAZINE HCL 50 MG PO TABS: 50.0000 mg | ORAL_TABLET | Freq: Three times a day (TID) | ORAL | 1 refills | Status: DC

## 2020-12-07 NOTE — Patient Instructions (Addendum)
F/U in  4 to  5  weeks, call if you need me sooner  New for blood pressure is amlodipine 5 mg one daily at bedtime New higher dose of hydralazine 50 mg one tablet three times daily  You are referred to Cardiology  I recommend that ypou reach out to Psychiatry and schedule an appointment since you are unable to accept unknown tele numbers  It is important that you exercise regularly at least 30 minutes 5 times a week. If you develop chest pain, have severe difficulty breathing, or feel very tired, stop exercising immediately and seek medical attention  Thanks for choosing [facility], we consider it a privelige to serve you.   Managing Your Hypertension Hypertension, also called high blood pressure, is when the force of the blood pressing against the walls of the arteries is too strong. Arteries are blood vessels that carry blood from your heart throughout your body. Hypertension forces the heart to work harder to pump blood and may cause the arteries to become narrow or stiff. Understanding blood pressure readings Your personal target blood pressure may vary depending on your medical conditions, your age, and other factors. A blood pressure reading includes a higher number over a lower number. Ideally, your blood pressure should be below 120/80. You should know that:  The first, or top, number is called the systolic pressure. It is a measure of the pressure in your arteries as your heart beats.  The second, or bottom number, is called the diastolic pressure. It is a measure of the pressure in your arteries as the heart relaxes. Blood pressure is classified into four stages. Based on your blood pressure reading, your health care provider may use the following stages to determine what type of treatment you need, if any. Systolic pressure and diastolic pressure are measured in a unit called mmHg. Normal  Systolic pressure: below 979.  Diastolic pressure: below  80. Elevated  Systolic pressure: 892-119.  Diastolic pressure: below 80. Hypertension stage 1  Systolic pressure: 417-408.  Diastolic pressure: 14-48. Hypertension stage 2  Systolic pressure: 185 or above.  Diastolic pressure: 90 or above. How can this condition affect me? Managing your hypertension is an important responsibility. Over time, hypertension can damage the arteries and decrease blood flow to important parts of the body, including the brain, heart, and kidneys. Having untreated or uncontrolled hypertension can lead to:  A heart attack.  A stroke.  A weakened blood vessel (aneurysm).  Heart failure.  Kidney damage.  Eye damage.  Metabolic syndrome.  Memory and concentration problems.  Vascular dementia. What actions can I take to manage this condition? Hypertension can be managed by making lifestyle changes and possibly by taking medicines. Your health care provider will help you make a plan to bring your blood pressure within a normal range. Nutrition  Eat a diet that is high in fiber and potassium, and low in salt (sodium), added sugar, and fat. An example eating plan is called the Dietary Approaches to Stop Hypertension (DASH) diet. To eat this way: ? Eat plenty of fresh fruits and vegetables. Try to fill one-half of your plate at each meal with fruits and vegetables. ? Eat whole grains, such as whole-wheat pasta, brown rice, or whole-grain bread. Fill about one-fourth of your plate with whole grains. ? Eat low-fat dairy products. ? Avoid fatty cuts of meat, processed or cured meats, and poultry with skin. Fill about one-fourth of your plate with lean proteins such as fish, chicken  without skin, beans, eggs, and tofu. ? Avoid pre-made and processed foods. These tend to be higher in sodium, added sugar, and fat.  Reduce your daily sodium intake. Most people with hypertension should eat less than 1,500 mg of sodium a day.   Lifestyle  Work with your  health care provider to maintain a healthy body weight or to lose weight. Ask what an ideal weight is for you.  Get at least 30 minutes of exercise that causes your heart to beat faster (aerobic exercise) most days of the week. Activities may include walking, swimming, or biking.  Include exercise to strengthen your muscles (resistance exercise), such as weight lifting, as part of your weekly exercise routine. Try to do these types of exercises for 30 minutes at least 3 days a week.  Do not use any products that contain nicotine or tobacco, such as cigarettes, e-cigarettes, and chewing tobacco. If you need help quitting, ask your health care provider.  Control any long-term (chronic) conditions you have, such as high cholesterol or diabetes.  Identify your sources of stress and find ways to manage stress. This may include meditation, deep breathing, or making time for fun activities.   Alcohol use  Do not drink alcohol if: ? Your health care provider tells you not to drink. ? You are pregnant, may be pregnant, or are planning to become pregnant.  If you drink alcohol: ? Limit how much you use to:  0-1 drink a day for women.  0-2 drinks a day for men. ? Be aware of how much alcohol is in your drink. In the [place] one drink equals one 12 oz bottle of beer (355 mL), one 5 oz glass of wine (148 mL), or one 1 oz glass of hard liquor (44 mL). Medicines Your health care provider may prescribe medicine if lifestyle changes are not enough to get your blood pressure under control and if:  Your systolic blood pressure is 130 or higher.  Your diastolic blood pressure is 80 or higher. Take medicines only as told by your health care provider. Follow the directions carefully. Blood pressure medicines must be taken as told by your health care provider. The medicine does not work as well when you skip doses. Skipping doses also puts you at risk for problems. Monitoring Before you monitor your blood  pressure:  Do not smoke, drink caffeinated beverages, or exercise within 30 minutes before taking a measurement.  Use the bathroom and empty your bladder (urinate).  Sit quietly for at least 5 minutes before taking measurements. Monitor your blood pressure at home as told by your health care provider. To do this:  Sit with your back straight and supported.  Place your feet flat on the floor. Do not cross your legs.  Support your arm on a flat surface, such as a table. Make sure your upper arm is at heart level.  Each time you measure, take two or three readings one minute apart and record the results. You may also need to have your blood pressure checked regularly by your health care provider.   General information  Talk with your health care provider about your diet, exercise habits, and other lifestyle factors that may be contributing to hypertension.  Review all the medicines you take with your health care provider because there may be side effects or interactions.  Keep all visits as told by your health care provider. Your health care provider can help you create and adjust your plan for managing your  high blood pressure. Where to find more information  National Heart, Lung, and Blood Institute: https://wilson-eaton.com/  American Heart Association: www.heart.org Contact a health care provider if:  You think you are having a reaction to medicines you have taken.  You have repeated (recurrent) headaches.  You feel dizzy.  You have swelling in your ankles.  You have trouble with your vision. Get help right away if:  You develop a severe headache or confusion.  You have unusual weakness or numbness, or you feel faint.  You have severe pain in your chest or abdomen.  You vomit repeatedly.  You have trouble breathing. These symptoms may represent a serious problem that is an emergency. Do not wait to see if the symptoms will go away. Get medical help right away. Call your  local emergency services (911 in the [place] Do not drive yourself to the hospital. Summary  Hypertension is when the force of blood pumping through your arteries is too strong. If this condition is not controlled, it may put you at risk for serious complications.  Your personal target blood pressure may vary depending on your medical conditions, your age, and other factors. For most people, a normal blood pressure is less than 120/80.  Hypertension is managed by lifestyle changes, medicines, or both.  Lifestyle changes to help manage hypertension include losing weight, eating a healthy, low-sodium diet, exercising more, stopping smoking, and limiting alcohol. This information is not intended to replace advice given to you by your health care provider. Make sure you discuss any questions you have with your health care provider. Document Revised: 08/08/2019 Document Reviewed: 06/03/2019 Elsevier Patient Education  2021 [employer].

## 2020-12-07 NOTE — Assessment & Plan Note (Signed)
Uncontrolled . Increase hydralazine dose and add amlodipine 10 mg daily. Re eval in 4 to 6 weeks DASH diet and commitment to daily physical activity for a minimum of 30 minutes discussed and encouraged, as a part of hypertension management. The importance of attaining a healthy weight is also discussed.  BP/Weight 12/07/2020 11/02/2020 10/20/2020 09/27/2020 09/23/2020 09/15/2020 6/57/8469  Systolic BP 629 528 413 - 244 010 272  Diastolic BP 536 90 92 - 90 82 83  Wt. (Lbs) 156.4 157 157.8 154 154 - 154  BMI 27.71 27.81 27.95 27.28 27.28 - 27.28  Some encounter information is confidential and restricted. Go to Review Flowsheets activity to see all data.

## 2020-12-07 NOTE — Assessment & Plan Note (Signed)
Unchanged, not suicidal or homicidal, pt to establish with community Psychiatry has to make her appt and is aware

## 2020-12-07 NOTE — Assessment & Plan Note (Addendum)
5 month history, office eKG and trefer to cardiology. Histry not suggestive of cardiac etiology, but has hyperlipidemia and HTN both uncontrolled Office EKG: Normal,  NSR, no LVH , no ischemia, refer to cardiology based on uncontrolled co morbidities also

## 2020-12-07 NOTE — Progress Notes (Signed)
   Shirley Bush     MRN: [id number]      DOB: [birth year]   HPI Shirley Bush is here for follow up and re-evaluation of chronic medical conditions, medication management and review of any available recent lab and radiology data.  Preventive health is updated, specifically  Cancer screening and Immunization.    Paxil caused muscle spasm, she is not suicidal or homicidal, will hold on additional medicationts she is to seek mental health treatment in the community, does not answer unknown tele calls Intermittent chest pain x 5 months, no associated symptoms, no aggravating or relieving factors  ROS Denies recent fever or chills. Denies sinus pressure, nasal congestion, ear pain or sore throat. Denies chest congestion, productive cough or wheezing. Denies abdominal pain, nausea, vomiting,diarrhea or constipation.   Denies dysuria, frequency, hesitancy or incontinence. Denies joint pain, swelling and limitation in mobility. Denies headaches, seizures, numbness, or tingling. C/o  Depression and  anxiety Denies skin break down or rash.   PE  BP (!) 192/105   Pulse 99   Resp 16   Ht 5\' 3"  (1.6 m)   Wt 156 lb 6.4 oz (70.9 kg)   SpO2 97%   BMI 27.71 kg/m   Patient alert and oriented and in no cardiopulmonary distress.  HEENT: No facial asymmetry, EOMI,     Neck supple .  Chest: Clear to auscultation bilaterally. No reproducible chest wall tenderness CVS: S1, S2 no murmurs, no S3.Regular rate. EKG: NSR, no ischemia, no LVH  ABD: Soft non tender.   Ext: No edema  MS: Adequate ROM spine, shoulders, hips and knees.  Skin: Intact, no ulcerations or rash noted.  Psych: Good eye contact, normal affect. Memory intact not anxious or depressed appearing.  CNS: CN 2-12 intact, power,  normal throughout.no focal deficits noted.   Assessment & Plan  Chest pain in adult 5 month history, office eKG and trefer to cardiology. Histry not suggestive of cardiac etiology, but has  hyperlipidemia and HTN both uncontrolled Office EKG: Normal,  NSR, no LVH , no ischemia, refer to cardiology based on uncontrolled co morbidities also  Malignant hypertension Uncontrolled . Increase hydralazine dose and add amlodipine 10 mg daily. Re eval in 4 to 6 weeks DASH diet and commitment to daily physical activity for a minimum of 30 minutes discussed and encouraged, as a part of hypertension management. The importance of attaining a healthy weight is also discussed.  BP/Weight 12/07/2020 11/02/2020 10/20/2020 09/27/2020 09/23/2020 09/15/2020 2/44/0102  Systolic BP 725 366 440 - 347 425 956  Diastolic BP 387 90 92 - 90 82 83  Wt. (Lbs) 156.4 157 157.8 154 154 - 154  BMI 27.71 27.81 27.95 27.28 27.28 - 27.28  Some encounter information is confidential and restricted. Go to Review Flowsheets activity to see all data.       MDD (major depressive disorder), recurrent episode, moderate (HCC) Unchanged, not suicidal or homicidal, pt to establish with community Psychiatry has to make her appt and is aware

## 2020-12-17 ENCOUNTER — Ambulatory Visit: Payer: Medicare Other | Admitting: Allergy & Immunology

## 2020-12-23 DIAGNOSIS — L603 Nail dystrophy: Secondary | ICD-10-CM | POA: Diagnosis not present

## 2020-12-23 DIAGNOSIS — L658 Other specified nonscarring hair loss: Secondary | ICD-10-CM | POA: Diagnosis not present

## 2020-12-28 ENCOUNTER — Ambulatory Visit: Payer: Medicare Other | Admitting: Family Medicine

## 2021-01-24 ENCOUNTER — Encounter: Payer: Self-pay | Admitting: Family Medicine

## 2021-01-24 ENCOUNTER — Other Ambulatory Visit: Payer: Self-pay

## 2021-01-24 ENCOUNTER — Ambulatory Visit (INDEPENDENT_AMBULATORY_CARE_PROVIDER_SITE_OTHER): Payer: Medicare Other | Admitting: Family Medicine

## 2021-01-24 VITALS — BP 156/94 | HR 98 | Temp 98.8°F | Resp 20 | Ht 63.0 in | Wt 152.0 lb

## 2021-01-24 DIAGNOSIS — R739 Hyperglycemia, unspecified: Secondary | ICD-10-CM | POA: Diagnosis not present

## 2021-01-24 DIAGNOSIS — E782 Mixed hyperlipidemia: Secondary | ICD-10-CM

## 2021-01-24 DIAGNOSIS — M170 Bilateral primary osteoarthritis of knee: Secondary | ICD-10-CM | POA: Diagnosis not present

## 2021-01-24 DIAGNOSIS — I10 Essential (primary) hypertension: Secondary | ICD-10-CM

## 2021-01-24 DIAGNOSIS — F331 Major depressive disorder, recurrent, moderate: Secondary | ICD-10-CM | POA: Diagnosis not present

## 2021-01-24 DIAGNOSIS — E559 Vitamin D deficiency, unspecified: Secondary | ICD-10-CM

## 2021-01-24 DIAGNOSIS — R7989 Other specified abnormal findings of blood chemistry: Secondary | ICD-10-CM | POA: Diagnosis not present

## 2021-01-24 MED ORDER — PAROXETINE HCL 10 MG PO TABS: 10.0000 mg | ORAL_TABLET | Freq: Every day | ORAL | 1 refills | Status: DC

## 2021-01-24 MED ORDER — AMLODIPINE BESYLATE 2.5 MG PO TABS: 2.5000 mg | ORAL_TABLET | Freq: Every day | ORAL | 3 refills | Status: DC

## 2021-01-24 NOTE — Assessment & Plan Note (Signed)
Improved skightly,  Score doen from 18 to 11, will continue current med dose, will try to increase slightly next visit

## 2021-01-24 NOTE — Assessment & Plan Note (Signed)
Updated lab needed at/ before next visit. Not at goal Hyperlipidemia:Low fat diet discussed and encouraged.   Lipid Panel  Lab Results  Component Value Date   CHOL 274 (H) 07/01/2020   HDL 59 07/01/2020   LDLCALC 190 (H) 07/01/2020   TRIG 137 07/01/2020   CHOLHDL 4.6 (H) 07/01/2020   Reports statin intolerance

## 2021-01-24 NOTE — Progress Notes (Signed)
   Shirley Bush     MRN: [id number]      DOB: Apr 02, 1956   HPI Shirley Bush is here for follow up and re-evaluation of chronic medical conditions, medication management and review of any available recent lab and radiology data.  Preventive health is updated, specifically  Cancer screening and Immunization.   Questions or concerns regarding consultations or procedures which the PT has had in the interim are  addressed. The PT reports that the whole paxil tablet caused neck pain so she has cut ity in half   ROS Denies recent fever or chills. Denies sinus pressure, nasal congestion, ear pain or sore throat. Denies chest congestion, productive cough or wheezing. Denies chest pains, palpitations and leg swelling Denies abdominal pain, nausea, vomiting,diarrhea or constipation.   Denies dysuria, frequency, hesitancy or incontinence. Denies joint pain, swelling and limitation in mobility. Denies headaches, seizures, numbness, or tingling. C/o s depression, anxiety improved slightly, not suicidal or homicidal. Denies skin break down or rash.   PE  BP (!) 156/94   Pulse 98   Temp 98.8 F (37.1 C)   Resp 20   Ht 5\' 3"  (1.6 m)   Wt 152 lb (68.9 kg)   SpO2 97%   BMI 26.93 kg/m    Patient alert and oriented and in no cardiopulmonary distress.  HEENT: No facial asymmetry, EOMI,     Neck supple .  Chest: Clear to auscultation bilaterally.  CVS: RRR  ABD: Soft non tender.   Ext: No edema  MS: Adequate ROM spine, shoulders, hips and knees.  Skin: Intact, no ulcerations or rash noted.  Psych: Good eye contact, normal affect. Memory intact not anxious or depressed appearing.  CNS: CN 2-12 intact, power,  normal throughout.no focal deficits noted.   Assessment & Plan Malignant hypertension Improved but not at goal, add additional amlodipine 2.5 mg tablet DASH diet and commitment to daily physical activity for a minimum of 30 minutes discussed and encouraged, as a part of  hypertension management. The importance of attaining a healthy weight is also discussed.  BP/Weight 01/24/2021 12/07/2020 11/02/2020 10/20/2020 09/27/2020 4/66/5993 11/21/175  Systolic BP 939 030 092 330 - 076 226  Diastolic BP 94 333 90 92 - 90 82  Wt. (Lbs) 152 156.4 157 157.8 154 154 -  BMI 26.93 27.71 27.81 27.95 27.28 27.28 -  Some encounter information is confidential and restricted. Go to Review Flowsheets activity to see all data.       Hyperlipidemia Updated lab needed at/ before next visit. Not at goal Hyperlipidemia:Low fat diet discussed and encouraged.   Lipid Panel  Lab Results  Component Value Date   CHOL 274 (H) 07/01/2020   HDL 59 07/01/2020   LDLCALC 190 (H) 07/01/2020   TRIG 137 07/01/2020   CHOLHDL 4.6 (H) 07/01/2020   Reports statin intolerance    MDD (major depressive disorder), recurrent episode, moderate (HCC) Improved skightly,  Score doen from 18 to 11, will continue current med dose, will try to increase slightly next visit

## 2021-01-24 NOTE — Patient Instructions (Signed)
F/U in 3 months, call if you need me sooner  New additional amlodipine tablet 2.5 mg once daily, continue the other 2 blood pressure meds as before, great that you are taking them consistently  New lower dose of paroxetine   Labs today, CBC, lipid, cmp and EGFr, TSH, vit D today  It is important that you exercise regularly at least 30 minutes 5 times a week. If you develop chest pain, have severe difficulty breathing, or feel very tired, stop exercising immediately and seek medical attention    Follow low fat diet rich in vegetables and fruit, drink 64 ozn water daily  Thanks for choosing [facility], we consider it a privelige to serve you.

## 2021-01-24 NOTE — Assessment & Plan Note (Signed)
Improved but not at goal, add additional amlodipine 2.5 mg tablet DASH diet and commitment to daily physical activity for a minimum of 30 minutes discussed and encouraged, as a part of hypertension management. The importance of attaining a healthy weight is also discussed.  BP/Weight 01/24/2021 12/07/2020 11/02/2020 10/20/2020 09/27/2020 8/38/1840 09/21/5434  Systolic BP 067 703 403 524 - 818 590  Diastolic BP 94 931 90 92 - 90 82  Wt. (Lbs) 152 156.4 157 157.8 154 154 -  BMI 26.93 27.71 27.81 27.95 27.28 27.28 -  Some encounter information is confidential and restricted. Go to Review Flowsheets activity to see all data.

## 2021-01-26 LAB — CBC WITH DIFFERENTIAL/PLATELET
Basophils Absolute: 0.1 10*3/uL (ref 0.0–0.2)
Basos: 1 %
EOS (ABSOLUTE): 0.1 10*3/uL (ref 0.0–0.4)
Eos: 2 %
Hematocrit: 39.9 % (ref 34.0–46.6)
Hemoglobin: 13.5 g/dL (ref 11.1–15.9)
Immature Grans (Abs): 0 10*3/uL (ref 0.0–0.1)
Immature Granulocytes: 0 %
Lymphocytes Absolute: 1.8 10*3/uL (ref 0.7–3.1)
Lymphs: 19 %
MCH: 30.5 pg (ref 26.6–33.0)
MCHC: 33.8 g/dL (ref 31.5–35.7)
MCV: 90 fL (ref 79–97)
Monocytes Absolute: 0.7 10*3/uL (ref 0.1–0.9)
Monocytes: 7 %
Neutrophils Absolute: 6.7 10*3/uL (ref 1.4–7.0)
Neutrophils: 71 %
Platelets: 398 10*3/uL (ref 150–450)
RBC: 4.43 x10E6/uL (ref 3.77–5.28)
RDW: 12.2 % (ref 11.7–15.4)
WBC: 9.4 10*3/uL (ref 3.4–10.8)

## 2021-01-26 LAB — LIPID PANEL
Chol/HDL Ratio: 3.8 ratio (ref 0.0–4.4)
Cholesterol, Total: 221 mg/dL — ABNORMAL HIGH (ref 100–199)
HDL: 58 mg/dL (ref >39)
LDL Chol Calc (NIH): 142 mg/dL — ABNORMAL HIGH (ref 0–99)
Triglycerides: 119 mg/dL (ref 0–149)
VLDL Cholesterol Cal: 21 mg/dL (ref 5–40)

## 2021-01-26 LAB — CMP14+EGFR
ALT: 24 IU/L (ref 0–32)
AST: 28 IU/L (ref 0–40)
Albumin/Globulin Ratio: 2 (ref 1.2–2.2)
Albumin: 5 g/dL — ABNORMAL HIGH (ref 3.8–4.8)
Alkaline Phosphatase: 98 IU/L (ref 44–121)
BUN/Creatinine Ratio: 9 — ABNORMAL LOW (ref 12–28)
BUN: 7 mg/dL — ABNORMAL LOW (ref 8–27)
Bilirubin Total: 0.2 mg/dL (ref 0.0–1.2)
CO2: 23 mmol/L (ref 20–29)
Calcium: 10.1 mg/dL (ref 8.7–10.3)
Chloride: 101 mmol/L (ref 96–106)
Creatinine, Ser: 0.81 mg/dL (ref 0.57–1.00)
Globulin, Total: 2.5 g/dL (ref 1.5–4.5)
Glucose: 110 mg/dL — ABNORMAL HIGH (ref 65–99)
Potassium: 4.5 mmol/L (ref 3.5–5.2)
Sodium: 141 mmol/L (ref 134–144)
Total Protein: 7.5 g/dL (ref 6.0–8.5)
eGFR: 82 mL/min/{1.73_m2} (ref >59)

## 2021-01-26 LAB — VITAMIN D 25 HYDROXY (VIT D DEFICIENCY, FRACTURES): Vit D, 25-Hydroxy: 114 ng/mL — ABNORMAL HIGH (ref 30.0–100.0)

## 2021-01-26 LAB — TSH: TSH: 3.93 u[IU]/mL (ref 0.450–4.500)

## 2021-01-26 NOTE — Progress Notes (Signed)
CARDIOLOGY CONSULT NOTE       Patient ID: Shirley Bush MRN: [id number] DOB/AGE: 11-08-1955 65 y.o.  Referring Physician: Moshe Cipro Primary Physician: Fayrene Helper, MD Primary Cardiologist: New Reason for Consultation: Chest Pain/HTN  Active Problems:   * No active hospital problems. *   HPI:  65 y.o. referred by Dr Moshe Cipro for chest pain and HTN. Also has history of migraines and fibromyalgia. Seen by primary in office 12/07/20.  Noted Depression with intolerance to Paxil causing muscle spasms. Complained of intermittent SSCP for 5 months no associated symptoms or aggravating / relieving factors BP was elevate 192/105 mmHg with pulse 99 ECG in office sinus with LVH.  Hydralazine increased and Norvasc 10 mg added for BP   Pain clearly related to ingesting oils like peanut Improved with antacids She does not get pain walking  Lots of stress Has two of her children and grand children living with her and cares for her mom  ROS All other systems reviewed and negative except as noted above  Past Medical History:  Diagnosis Date   Anxiety    Depression 2002   Depression    Phreesia 06/28/2020   Depression    Phreesia 07/23/2020   Fibromyalgia    Hypertension 2004   Medial meniscus tear 04/2013   left   Migraines     Family History  Problem Relation Age of Onset   Hypertension Mother    Hypertension Father    Alcohol abuse Father    Seizures Father    Migraines Sister    Thyroid disease Sister    Colon cancer Sister        diagnosed in late 27s.    Thyroid disease Sister    Cancer Sister        breast    Cancer Maternal Aunt        gyne cancer    Social History   Socioeconomic History   Marital status: Divorced    Spouse name: Not on file   Number of children: 3   Years of education: 12 grade    Highest education level: 12th grade  Occupational History   Occupation: disability   Tobacco Use   Smoking status: Never   Smokeless tobacco: Never   Vaping Use   Vaping Use: Never used  Substance and Sexual Activity   Alcohol use: Yes    Comment: occasionally- rarely.    Drug use: No   Sexual activity: Not Currently  Other Topics Concern   Not on file  Social History Narrative   Son is living with her at the moment, patient states that she is isolating in her home and is depressed more lately    Social Determinants of Health   Financial Resource Strain: Low Risk    Difficulty of Paying Living Expenses: Not very hard  Food Insecurity: Not on file  Transportation Needs: No Transportation Needs   Lack of Transportation (Medical): No   Lack of Transportation (Non-Medical): No  Physical Activity: Inactive   Days of Exercise per Week: 0 days   Minutes of Exercise per Session: 0 min  Stress: Stress Concern Present   Feeling of Stress : To some extent  Social Connections: Socially Isolated   Frequency of Communication with Friends and Family: Twice a week   Frequency of Social Gatherings with Friends and Family: Once a week   Attends Religious Services: Never   Marine scientist or Organizations: No   Attends Archivist Meetings:  Never   Marital Status: Widowed  Human resources officer Violence: Not At Risk   Fear of Current or Ex-Partner: No   Emotionally Abused: No   Physically Abused: No   Sexually Abused: No    Past Surgical History:  Procedure Laterality Date   [place] N/A    Phreesia 06/28/2020   COLONOSCOPY  02/05/2009   COLONOSCOPY N/A 03/06/2014   Procedure: COLONOSCOPY;  Surgeon: Danie Binder, MD;  [facility];  Service: Endoscopy;  Laterality: N/A;  10:30 AM   COLONOSCOPY WITH PROPOFOL N/A 01/14/2019   Surgeon: Danie Binder, MD;  one 3 mm tubular adenoma in the mid ascending colon, moderate diverticulosis in entire colon, external and internal hemorrhoids, tortuous left colon. Recommended repeat in 5  years.    FINGER SURGERY Right    long finger   HERNIA REPAIR N/A    Phreesia 06/28/2020   JOINT REPLACEMENT N/A    Phreesia 06/28/2020   KNEE ARTHROSCOPY WITH MEDIAL MENISECTOMY Left 04/24/2013   Procedure: LEFT KNEE ARTHROSCOPY CHONDROPLASTY WITH MEDIAL MENISECTOMY;  Surgeon: Ninetta Lights, MD;  Location: [place];  Service: Orthopedics;  Laterality: Left;   SHOULDER ARTHROSCOPY Left 2009   SHOULDER ARTHROSCOPY Right    SHOULDER ARTHROSCOPY WITH ROTATOR CUFF REPAIR Left 11/25/2004   TOTAL KNEE ARTHROPLASTY Left 11/11/2014   Procedure: LEFT TOTAL KNEE ARTHROPLASTY;  Surgeon: Kathryne Hitch, MD;  Location: [place];  Service: Orthopedics;  Laterality: Left;   TUBAL LIGATION  8099   UMBILICAL HERNIA REPAIR  2008      Current Outpatient Medications:    amLODipine (NORVASC) 2.5 MG tablet, Take 1 tablet (2.5 mg total) by mouth daily., Disp: 30 tablet, Rfl: 3   amLODipine (NORVASC) 5 MG tablet, Take 1 tablet (5 mg total) by mouth daily., Disp: 30 tablet, Rfl: 3   cholecalciferol (VITAMIN D3) 25 MCG (1000 UNIT) tablet, Take 3,000 Units by mouth daily., Disp: , Rfl:    famciclovir (FAMVIR) 500 MG tablet, Take 1 tablet (500 mg total) by mouth 3 (three) times daily., Disp: 21 tablet, Rfl: 0   fluticasone (FLONASE) 50 MCG/ACT nasal spray, Place 2 sprays into both nostrils daily., Disp: 16 g, Rfl: 6   hydrALAZINE (APRESOLINE) 50 MG tablet, Take 1 tablet (50 mg total) by mouth 3 (three) times daily., Disp: 90 tablet, Rfl: 1   Misc Natural Products (AIRBORNE ELDERBERRY) CHEW, Chew 1 each by mouth daily., Disp: , Rfl:    Multiple Vitamins-Minerals (HAIR SKIN AND NAILS FORMULA) TABS, Take 3 tablets by mouth daily., Disp: , Rfl:    PARoxetine (PAXIL) 20 MG tablet, Take 10 mg by mouth daily., Disp: , Rfl:    Specialty Vitamins Products (COLLAGEN ULTRA) CAPS, Take 1 each by mouth daily., Disp: , Rfl:    Zinc Sulfate (ZINC 15 PO), Take by mouth. Daily, Disp: , Rfl:     Physical  Exam: Blood pressure (!) 164/86, pulse 98, height 5\' 3"  (1.6 m), weight 69.3 kg, SpO2 96 %.    Affect appropriate Healthy:  appears stated age 65: normal Neck supple with no adenopathy JVP normal no bruits no thyromegaly Lungs clear with no wheezing and good diaphragmatic motion Heart:  S1/S2  AV sclerosis murmur, no rub, gallop or click PMI normal Abdomen: benighn, BS positve, no tenderness, no AAA no bruit.  No HSM or HJR Distal pulses intact with no bruits  No edema Neuro non-focal Skin warm and dry No muscular weakness   Labs:   Lab Results  Component Value Date   WBC 9.4 01/24/2021   HGB 13.5 01/24/2021   HCT 39.9 01/24/2021   MCV 90 01/24/2021   PLT 398 01/24/2021    No results for input(s): NA, K, CL, CO2, BUN, CREATININE, CALCIUM, PROT, BILITOT, ALKPHOS, ALT, AST, GLUCOSE in the last 168 hours.  Invalid input(s): LABALBU  No results found for: CKTOTAL, CKMB, CKMBINDEX, TROPONINI  Lab Results  Component Value Date   CHOL 221 (H) 01/24/2021   CHOL 274 (H) 07/01/2020   CHOL 224 (H) 10/03/2019   Lab Results  Component Value Date   HDL 58 01/24/2021   HDL 59 07/01/2020   HDL 51 10/03/2019   Lab Results  Component Value Date   LDLCALC 142 (H) 01/24/2021   LDLCALC 190 (H) 07/01/2020   LDLCALC 148 (H) 10/03/2019   Lab Results  Component Value Date   TRIG 119 01/24/2021   TRIG 137 07/01/2020   TRIG 123 10/03/2019   Lab Results  Component Value Date   CHOLHDL 3.8 01/24/2021   CHOLHDL 4.6 (H) 07/01/2020   CHOLHDL 4.4 10/03/2019   No results found for: LDLDIRECT    Radiology: No results found.  EKG: SR rate 82 normal to my eyes no LVH 12/07/20    ASSESSMENT AND PLAN:   Chest Pain atypical CRF;s poorly controlled HTN and unRx HLD.  Ex myovue ordered  HTN:  labile managed by primary recent increase in hydralazine and norvasc added HLD:  discussed coronary calcium score to further risk stratify and guide any possible Rx  Depression: Still on  Paxil f/u primary  Murmur:  AV sclerosis type f/u echo   Lexiscan myovue Echo for murmur   F/U PRN if normal / low risk   Signed: Jenkins Rouge 02/03/2021, 10:24 AM

## 2021-01-28 LAB — HEMOGLOBIN A1C
Est. average glucose Bld gHb Est-mCnc: 120 mg/dL
Hgb A1c MFr Bld: 5.8 % — ABNORMAL HIGH (ref 4.8–5.6)

## 2021-01-28 LAB — SPECIMEN STATUS REPORT

## 2021-02-03 ENCOUNTER — Encounter: Payer: Self-pay | Admitting: Cardiovascular Disease

## 2021-02-03 ENCOUNTER — Encounter: Payer: Self-pay | Admitting: *Deleted

## 2021-02-03 ENCOUNTER — Ambulatory Visit: Payer: Medicare Other | Admitting: Cardiovascular Disease

## 2021-02-03 ENCOUNTER — Other Ambulatory Visit: Payer: Self-pay

## 2021-02-03 VITALS — BP 164/86 | HR 98 | Ht 63.0 in | Wt 152.8 lb

## 2021-02-03 DIAGNOSIS — R011 Cardiac murmur, unspecified: Secondary | ICD-10-CM

## 2021-02-03 DIAGNOSIS — R079 Chest pain, unspecified: Secondary | ICD-10-CM

## 2021-02-03 DIAGNOSIS — I10 Essential (primary) hypertension: Secondary | ICD-10-CM

## 2021-02-03 NOTE — Patient Instructions (Signed)
Medication Instructions:  Your physician recommends that you continue on your current medications as directed. Please refer to the Current Medication list given to you today.  *If you need a refill on your cardiac medications before your next appointment, please call your pharmacy*   Lab Work: NONE   If you have labs (blood work) drawn today and your tests are completely normal, you will receive your results only by: [place] (if you have MyChart) OR A paper copy in the mail If you have any lab test that is abnormal or we need to change your treatment, we will call you to review the results.   Testing/Procedures: Your physician has requested that you have en exercise stress myoview. For further information please visit [email or web address]. Please follow instruction sheet, as given.   Your physician has requested that you have an echocardiogram. Echocardiography is a painless test that uses sound waves to create images of your heart. It provides your doctor with information about the size and shape of your heart and how well your heart's chambers and valves are working. This procedure takes approximately one hour. There are no restrictions for this procedure.    Follow-Up: At [facility], you and your health needs are our priority.  As part of our continuing mission to provide you with exceptional heart care, we have created designated Provider Care Teams.  These Care Teams include your primary Cardiologist (physician) and Advanced Practice Providers (APPs -  Physician Assistants and Nurse Practitioners) who all work together to provide you with the care you need, when you need it.  We recommend signing up for the patient portal called "MyChart".  Sign up information is provided on this After Visit Summary.  MyChart is used to connect with patients for Virtual Visits (Telemedicine).  Patients are able to view lab/test results, encounter notes, upcoming appointments, etc.  Non-urgent  messages can be sent to your provider as well.   To learn more about what you can do with MyChart, go to [email or web address].    Your next appointment:   1 year(s)  The format for your next appointment:   In Person  Provider:   Jenkins Rouge, MD   Other Instructions Thank you for choosing [facility]!

## 2021-02-16 ENCOUNTER — Telehealth: Payer: Self-pay

## 2021-02-16 ENCOUNTER — Ambulatory Visit (HOSPITAL_COMMUNITY)
Admission: RE | Admit: 2021-02-16 | Discharge: 2021-02-16 | Disposition: A | Discharge status: Home / Self Care | Payer: Medicare Other | Source: Ambulatory Visit | Attending: Cardiovascular Disease | Admitting: Cardiovascular Disease

## 2021-02-16 ENCOUNTER — Other Ambulatory Visit: Payer: Self-pay

## 2021-02-16 ENCOUNTER — Encounter (HOSPITAL_COMMUNITY): Payer: Self-pay

## 2021-02-16 ENCOUNTER — Ambulatory Visit (HOSPITAL_BASED_OUTPATIENT_CLINIC_OR_DEPARTMENT_OTHER)
Admission: RE | Admit: 2021-02-16 | Discharge: 2021-02-16 | Disposition: A | Discharge status: Home / Self Care | Payer: Medicare Other | Source: Ambulatory Visit | Attending: Cardiovascular Disease | Admitting: Cardiovascular Disease

## 2021-02-16 DIAGNOSIS — R079 Chest pain, unspecified: Secondary | ICD-10-CM | POA: Insufficient documentation

## 2021-02-16 DIAGNOSIS — I447 Left bundle-branch block, unspecified: Secondary | ICD-10-CM | POA: Diagnosis not present

## 2021-02-16 DIAGNOSIS — E785 Hyperlipidemia, unspecified: Secondary | ICD-10-CM | POA: Diagnosis not present

## 2021-02-16 DIAGNOSIS — R011 Cardiac murmur, unspecified: Secondary | ICD-10-CM | POA: Diagnosis not present

## 2021-02-16 DIAGNOSIS — I10 Essential (primary) hypertension: Secondary | ICD-10-CM | POA: Diagnosis not present

## 2021-02-16 LAB — NM MYOCAR MULTI W/SPECT W/WALL MOTION / EF
Estimated workload: 4.6 METS
Exercise duration (min): 3 min
Exercise duration (sec): 0 s
LV dias vol: 61 mL (ref 46–106)
LV sys vol: 16 mL
MPHR: 158 {beats}/min
Peak HR: 151 {beats}/min
Percent HR: 95 %
RATE: 0.33
RPE: 9
Rest HR: 77 {beats}/min
SDS: 0
SRS: 2
SSS: 2
TID: 0.99

## 2021-02-16 LAB — ECHOCARDIOGRAM COMPLETE
AR max vel: 1.52 cm2
AV Area VTI: 1.64 cm2
AV Area mean vel: 1.62 cm2
AV Mean grad: 6 mmHg
AV Peak grad: 11.3 mmHg
Ao pk vel: 1.68 m/s
Area-P 1/2: 4.19 cm2
MV VTI: 2.18 cm2
S' Lateral: 2.57 cm

## 2021-02-16 MED ORDER — TECHNETIUM TC 99M TETROFOSMIN IV KIT
30.0000 | PACK | Freq: Once | INTRAVENOUS | Status: AC | PRN
  Administered 2021-02-16: 33 via INTRAVENOUS

## 2021-02-16 MED ORDER — REGADENOSON 0.4 MG/5ML IV SOLN
INTRAVENOUS | Status: AC
  Filled 2021-02-16: qty 5

## 2021-02-16 MED ORDER — TECHNETIUM TC 99M TETROFOSMIN IV KIT
10.0000 | PACK | Freq: Once | INTRAVENOUS | Status: AC | PRN
  Administered 2021-02-16: 10.3 via INTRAVENOUS

## 2021-02-16 MED ORDER — SODIUM CHLORIDE FLUSH 0.9 % IV SOLN
INTRAVENOUS | Status: AC
  Administered 2021-02-16: 10 mL via INTRAVENOUS
  Filled 2021-02-16: qty 10

## 2021-02-16 NOTE — Telephone Encounter (Signed)
Pt notified and voiced understanding. Pt had no questions or concerns at this time. 

## 2021-02-17 LAB — SPECIMEN STATUS REPORT

## 2021-02-17 LAB — HGB A1C W/O EAG

## 2021-02-18 ENCOUNTER — Encounter: Payer: Self-pay | Admitting: Family Medicine

## 2021-02-18 ENCOUNTER — Ambulatory Visit (INDEPENDENT_AMBULATORY_CARE_PROVIDER_SITE_OTHER): Payer: Medicare Other | Admitting: Family Medicine

## 2021-02-18 ENCOUNTER — Other Ambulatory Visit: Payer: Self-pay

## 2021-02-18 VITALS — BP 161/93 | HR 87 | Ht 63.0 in | Wt 151.8 lb

## 2021-02-18 DIAGNOSIS — F418 Other specified anxiety disorders: Secondary | ICD-10-CM

## 2021-02-18 DIAGNOSIS — I10 Essential (primary) hypertension: Secondary | ICD-10-CM

## 2021-02-18 MED ORDER — AMLODIPINE BESYLATE 10 MG PO TABS: ORAL_TABLET | ORAL | 3 refills | Status: DC

## 2021-02-18 NOTE — Patient Instructions (Signed)
F/U in 8 weeks, call if you need me sooner  New higher dose of amlodipine is 10 mg one every evening at 10 pm for your blood pressure, this is your only bP medication  Reduce salt in diet, and increase vegetable and fruit, fresh or frozen  It is important that you exercise regularly at least 30 minutes 5 times a week. If you develop chest pain, have severe difficulty breathing, or feel very tired, stop exercising immediately and seek medical attention   Thanks for choosing [facility], we consider it a privelige to serve you.

## 2021-02-18 NOTE — Assessment & Plan Note (Signed)
Improved, continue current medication

## 2021-02-18 NOTE — Assessment & Plan Note (Signed)
Uncontrolled, increase amlodipine to 10 mg daily DASH diet and commitment to daily physical activity for a minimum of 30 minutes discussed and encouraged, as a part of hypertension management. The importance of attaining a healthy weight is also discussed.  BP/Weight 02/18/2021 02/03/2021 01/24/2021 12/07/2020 11/02/2020 10/20/2020 Q000111Q  Systolic BP Q000111Q [id number] 0000000 -  Diastolic BP 93 86 94 123456 90 92 -  Wt. (Lbs) 151.75 152.8 152 156.4 157 157.8 154  BMI 26.88 27.07 26.93 27.71 27.81 27.95 27.28  Some encounter information is confidential and restricted. Go to Review Flowsheets activity to see all data.

## 2021-02-18 NOTE — Progress Notes (Signed)
   GWENLYN Bush     MRN: [id number]      DOB: 09/30/1955   HPI [name] is here for follow up and re-evaluation of uncontrolled hypertension.  Preventive health is updated, specifically  Cancer screening and Immunization.   Questions or concerns regarding consultations or procedures which the PT has had in the interim are  addressed.Good report from Cardiology The PT has not been taking hydralazine as prescribed nor the amlodipine 2.5 mg tablet    ROS Denies recent fever or chills. Denies sinus pressure, nasal congestion, ear pain or sore throat. Denies chest congestion, productive cough or wheezing. Denies chest pains, palpitations and leg swelling Denies abdominal pain, nausea, vomiting,diarrhea or constipation.   Denies dysuria, frequency, hesitancy or incontinence. Denies joint pain, swelling and limitation in mobility. Denies headaches, seizures, numbness, or tingling. Denies uncontrolled depression, anxiety or insomnia. Denies skin break down or rash.   PE  BP (!) 161/93   Pulse 87   Ht '5\' 3"'$  (1.6 m)   Wt 151 lb 12 oz (68.8 kg)   BMI 26.88 kg/m   Patient alert and oriented and in no cardiopulmonary distress.  HEENT: No facial asymmetry, EOMI,     Neck supple .  Chest: Clear to auscultation bilaterally.  CVS: S1, S2 no murmurs, no S3.Regular rate.  ABD: Soft non tender.   Ext: No edema  Psych: Good eye contact, normal affect. Memory intact not anxious or depressed appearing.  CNS: CN 2-12 intact, power,  normal throughout.no focal deficits noted.   Assessment & Plan  Malignant hypertension Uncontrolled, increase amlodipine to 10 mg daily DASH diet and commitment to daily physical activity for a minimum of 30 minutes discussed and encouraged, as a part of hypertension management. The importance of attaining a healthy weight is also discussed.  BP/Weight 02/18/2021 02/03/2021 01/24/2021 12/07/2020 11/02/2020 10/20/2020 Q000111Q  Systolic BP Q000111Q [id number]  0000000 -  Diastolic BP 93 86 94 123456 90 92 -  Wt. (Lbs) 151.75 152.8 152 156.4 157 157.8 154  BMI 26.88 27.07 26.93 27.71 27.81 27.95 27.28  Some encounter information is confidential and restricted. Go to Review Flowsheets activity to see all data.       Situational anxiety Improved, continue current medication

## 2021-03-15 ENCOUNTER — Ambulatory Visit: Payer: Medicare Other | Admitting: Family Medicine

## 2021-03-31 ENCOUNTER — Ambulatory Visit: Payer: Medicare Other | Admitting: Gastroenterology

## 2021-03-31 DIAGNOSIS — B9689 Other specified bacterial agents as the cause of diseases classified elsewhere: Secondary | ICD-10-CM | POA: Diagnosis not present

## 2021-03-31 DIAGNOSIS — L02821 Furuncle of head [any part, except face]: Secondary | ICD-10-CM | POA: Diagnosis not present

## 2021-03-31 DIAGNOSIS — L658 Other specified nonscarring hair loss: Secondary | ICD-10-CM | POA: Diagnosis not present

## 2021-04-15 ENCOUNTER — Ambulatory Visit (INDEPENDENT_AMBULATORY_CARE_PROVIDER_SITE_OTHER): Payer: Medicare Other | Admitting: Family Medicine

## 2021-04-15 ENCOUNTER — Other Ambulatory Visit: Payer: Self-pay

## 2021-04-15 ENCOUNTER — Encounter: Payer: Self-pay | Admitting: Family Medicine

## 2021-04-15 VITALS — BP 159/89 | HR 101 | Resp 16 | Ht 63.0 in | Wt 152.0 lb

## 2021-04-15 DIAGNOSIS — F331 Major depressive disorder, recurrent, moderate: Secondary | ICD-10-CM | POA: Diagnosis not present

## 2021-04-15 DIAGNOSIS — Z23 Encounter for immunization: Secondary | ICD-10-CM | POA: Diagnosis not present

## 2021-04-15 DIAGNOSIS — E785 Hyperlipidemia, unspecified: Secondary | ICD-10-CM

## 2021-04-15 DIAGNOSIS — I10 Essential (primary) hypertension: Secondary | ICD-10-CM

## 2021-04-15 DIAGNOSIS — F411 Generalized anxiety disorder: Secondary | ICD-10-CM

## 2021-04-15 DIAGNOSIS — E673 Hypervitaminosis D: Secondary | ICD-10-CM | POA: Diagnosis not present

## 2021-04-15 MED ORDER — AMLODIPINE BESYLATE 2.5 MG PO TABS: 2.5000 mg | ORAL_TABLET | Freq: Every day | ORAL | 2 refills | Status: DC

## 2021-04-15 MED ORDER — PAROXETINE HCL 30 MG PO TABS: 30.0000 mg | ORAL_TABLET | Freq: Every day | ORAL | 3 refills | Status: DC

## 2021-04-15 NOTE — Progress Notes (Signed)
   Shirley Bush     MRN: [id number]      DOB: Jun 09, 1956   HPI Shirley Bush is here for follow up and re-evaluation of chronic medical conditions, medication management and review of any available recent lab and radiology data.  Preventive health is updated, specifically  Cancer screening and Immunization.   Questions or concerns regarding consultations or procedures which the PT has had in the interim are  addressed. The PT denies any adverse reactions to current medications since the last visit.  There are no new concerns.  There are no specific complaints   ROS Denies recent fever or chills. Denies sinus pressure, nasal congestion, ear pain or sore throat. Denies chest congestion, productive cough or wheezing. Denies chest pains, palpitations and leg swelling Denies abdominal pain, nausea, vomiting,diarrhea or constipation.   Denies dysuria, frequency, hesitancy or incontinence. Denies joint pain, swelling and limitation in mobility. Denies headaches, seizures, numbness, or tingling. C.o depression,and uncontrolled  anxiety has  insomnia. Denies skin break down or rash.   PE  BP (!) 159/89   Pulse (!) 101   Resp 16   Ht 5\' 3"  (1.6 m)   Wt 152 lb (68.9 kg)   SpO2 96%   BMI 26.93 kg/m   Patient alert and oriented and in no cardiopulmonary distress.  HEENT: No facial asymmetry, EOMI,     Neck supple .  Chest: Clear to auscultation bilaterally.  CVS: S1, S2 no murmurs, no S3.Regular rate.  ABD: Soft non tender.   Ext: No edema  MS: Adequate ROM spine, shoulders, hips and knees.  Skin: Intact, no ulcerations or rash noted.  Psych: Good eye contact, normal affect. Memory intact anxious not depressed appearing.  CNS: CN 2-12 intact, power,  normal throughout.no focal deficits noted.   Assessment & Plan  Malignant hypertension Improved but not at goal add amlodipine 2.5 mg DASH diet and commitment to daily physical activity for a minimum of 30 minutes discussed  and encouraged, as a part of hypertension management. The importance of attaining a healthy weight is also discussed.  BP/Weight 04/15/2021 02/18/2021 02/03/2021 01/24/2021 12/07/2020 10/24/7351 08/25/9240  Systolic BP 683 419 622 297 989 211 941  Diastolic BP 89 93 86 94 740 90 92  Wt. (Lbs) 152 151.75 152.8 152 156.4 157 157.8  BMI 26.93 26.88 27.07 26.93 27.71 27.81 27.95  Some encounter information is confidential and restricted. Go to Review Flowsheets activity to see all data.       MDD (major depressive disorder), recurrent episode, moderate (HCC) Improved but not at goal, increase paxil to 30 mg, refer therapy  GAD (generalized anxiety disorder) Increase paxil and refer to therapy  Hyperlipidemia Hyperlipidemia:Low fat diet discussed and encouraged.   Lipid Panel  Lab Results  Component Value Date   CHOL 221 (H) 01/24/2021   HDL 58 01/24/2021   LDLCALC 142 (H) 01/24/2021   TRIG 119 01/24/2021   CHOLHDL 3.8 01/24/2021     Updated lab needed at/ before next visit.   High vitamin D level Advised to stop supplement, will rept in 4 months

## 2021-04-15 NOTE — Assessment & Plan Note (Signed)
Increase paxil and refer to therapy

## 2021-04-15 NOTE — Assessment & Plan Note (Signed)
Improved but not at goal add amlodipine 2.5 mg DASH diet and commitment to daily physical activity for a minimum of 30 minutes discussed and encouraged, as a part of hypertension management. The importance of attaining a healthy weight is also discussed.  BP/Weight 04/15/2021 02/18/2021 02/03/2021 01/24/2021 12/07/2020 0/14/1030 07/19/1436  Systolic BP 887 579 728 206 015 615 379  Diastolic BP 89 93 86 94 432 90 92  Wt. (Lbs) 152 151.75 152.8 152 156.4 157 157.8  BMI 26.93 26.88 27.07 26.93 27.71 27.81 27.95  Some encounter information is confidential and restricted. Go to Review Flowsheets activity to see all data.

## 2021-04-15 NOTE — Assessment & Plan Note (Signed)
Hyperlipidemia:Low fat diet discussed and encouraged.   Lipid Panel  Lab Results  Component Value Date   CHOL 221 (H) 01/24/2021   HDL 58 01/24/2021   LDLCALC 142 (H) 01/24/2021   TRIG 119 01/24/2021   CHOLHDL 3.8 01/24/2021     Updated lab needed at/ before next visit.

## 2021-04-15 NOTE — Assessment & Plan Note (Signed)
Improved but not at goal, increase paxil to 30 mg, refer therapy

## 2021-04-15 NOTE — Patient Instructions (Signed)
Follow-up in 6 to 8 weeks call if you need me sooner.  Flu vaccine today.  You are referred to therapist important that you follow through.  Increasing Paxil to 30 mg daily.  Blood pressure is improved.  Increase amlodipine to 12.5 mg daily.  Stop supplemental vitamin D your levels are too high.  Thankful that there is slight improvement in your overall health.  It is important that you exercise regularly at least 30 minutes 5 times a week. If you develop chest pain, have severe difficulty breathing, or feel very tired, stop exercising immediately and seek medical attention  Thanks for choosing [facility], we consider it a privelige to serve you.

## 2021-04-15 NOTE — Assessment & Plan Note (Signed)
Advised to stop supplement, will rept in 4 months

## 2021-04-26 ENCOUNTER — Ambulatory Visit (INDEPENDENT_AMBULATORY_CARE_PROVIDER_SITE_OTHER): Payer: Medicare Other | Admitting: Licensed Clinical Social Worker

## 2021-04-26 ENCOUNTER — Other Ambulatory Visit: Payer: Self-pay

## 2021-04-26 ENCOUNTER — Ambulatory Visit: Payer: Medicare Other | Admitting: Family Medicine

## 2021-04-26 DIAGNOSIS — F331 Major depressive disorder, recurrent, moderate: Secondary | ICD-10-CM

## 2021-04-26 DIAGNOSIS — F411 Generalized anxiety disorder: Secondary | ICD-10-CM

## 2021-04-27 ENCOUNTER — Telehealth (INDEPENDENT_AMBULATORY_CARE_PROVIDER_SITE_OTHER): Payer: Medicare Other | Admitting: Licensed Clinical Social Worker

## 2021-04-27 DIAGNOSIS — F411 Generalized anxiety disorder: Secondary | ICD-10-CM

## 2021-04-27 DIAGNOSIS — F331 Major depressive disorder, recurrent, moderate: Secondary | ICD-10-CM

## 2021-04-27 NOTE — BH Specialist Note (Signed)
[place] Initial TeleMedicine Clinical Assessment  MRN: [id number] NAME: Shirley Bush Date: 04/27/21  Start time: 4 End time: 950 Total time: 20  Types of Service: Telephone visit Referring Provider: Dr. Moshe Cipro Reason for Visit today: begin [facility] session  Patient/Family location: home [place] Provider location: home office All persons participating in visit: yes  I connected with patient and/or family via Telephone or Video Enabled Telemedicine Application  (Video is Caregility application) and verified that I am speaking with the correct person using two identifiers.   Discussed confidentiality: Yes   I discussed the limitations of telemedicine and the availability of in person appointments.  Discussed there is a possibility of technology failure and discussed alternative modes of communication if that failure occurs.  I discussed that engaging in this telemedicine visit, they consent to the provision of behavioral healthcare and the services will be billed under their insurance.  Patient and/or legal guardian expressed understanding and consented to Telemedicine visit: Yes   Treatment History Patient recently received Inpatient Treatment: No  Patient currently being seen by therapist/psychiatrist: No  Patient currently receiving the following services: depression  Past Psychiatric History/Diagnosis/Hospitalization(s): Anxiety: No Bipolar Disorder: No Depression: Yes Mania: No Psychosis: No Schizophrenia: No Personality Disorder: No Hospitalization for psychiatric illness: No History of Electroconvulsive Shock Therapy: No Prior Suicide Attempts: No  Decreased need for sleep: No  Euphoria: No  Self Injurious behaviors: No  Family History of mental illness: No  Family History of substance abuse: No  Substance Abuse: No  DUI: No  Insomnia: No   History of violence: No  Physical, sexual or emotional abuse: No  Prior outpatient mental  health therapy: No   Clinical Assessment  PHQ-9 & GAD-7 Assessments: This is an evidence based assessment tool for depression and anxiety symptoms in adolescents and adults.   GAD 7 : Generalized Anxiety Score 04/27/2021 04/15/2021 01/24/2021 11/02/2020  Nervous, Anxious, on Edge 2 3 3 3   Control/stop worrying 2 2 2 3   Worry too much - different things 2 3 2 3   Trouble relaxing 2 3 0 3  Restless 2 3 0 3  Easily annoyed or irritable 3 3 3 3   Afraid - awful might happen 1 0 0 1  Total GAD 7 Score 14 17 10 19   Anxiety Difficulty Somewhat difficult Very difficult - Somewhat difficult     PHQ9 SCORE ONLY 04/27/2021 04/15/2021 02/18/2021  PHQ-9 Total Score 18 8 6      Social Functioning Social maturity: wnl Social judgement: wnl  Stress Current stressors: mood Familial stressors: denies Sleep: poor Appetite: fair Coping ability: overwhelmed Patient taking medications as prescribed:    Current medications:  Outpatient Encounter Medications as of 04/26/2021  Medication Sig   amLODipine (NORVASC) 10 MG tablet Take one tablet by mouth at 10 pm every night for blood pressure   amLODipine (NORVASC) 2.5 MG tablet Take 1 tablet (2.5 mg total) by mouth daily.   fluticasone (FLONASE) 50 MCG/ACT nasal spray Place 2 sprays into both nostrils daily.   Misc Natural Products (AIRBORNE ELDERBERRY) CHEW Chew 1 each by mouth daily.   Multiple Vitamins-Minerals (HAIR SKIN AND NAILS FORMULA) TABS Take 3 tablets by mouth daily.   PARoxetine (PAXIL) 30 MG tablet Take 1 tablet (30 mg total) by mouth daily.   Specialty Vitamins Products (COLLAGEN ULTRA) CAPS Take 1 each by mouth daily.   Zinc Sulfate (ZINC 15 PO) Take by mouth. Daily   No facility-administered encounter medications on file as  of 04/26/2021.     Self-harm and/or Suicidal Behaviors Risk Assessment Self-harm risk factors: no Patient endorses recent self injurious thoughts and/or behaviors: No  Suicide ideations: No plan to harm self or  others   Danger to Others Risk Assessment Danger to others risk factors: no Patient endorses recent thoughts of harming others: No    Substance Use Assessment Patient recently consumed alcohol: No  Patient recently used drugs: No  Patient is concerned about dependence or abuse of substances: No    Goals, Interventions and Follow-up Plan Goals: Increase healthy adjustment to current life circumstances Interventions: Behavioral Activation and CBT Cognitive Behavioral Therapy Follow-up Plan:  biweekly vbh session  Summary of Clinical Assessment Summary: Shirley Bush is a 65 yr old woman who was referred by her PCP.  She reports that she is taking 30 mg of Paxil daily.  She continues to have sadness, poor concentration, lack of energy, poor sleep and appetite.  She request VBH sessions to assist with improving symptoms. Discussed the layout and structure of the sessions and provided her with mood tracker activity and will discuss a the next session.   Shirley South, LCSW

## 2021-04-27 NOTE — BH Specialist Note (Signed)
Virtual Behavioral Health Treatment Plan Team Note  MRN: [id number] NAME: Shirley Bush  DATE: 04/27/21  Start time:   340End time:  345p Total time:  32min  Total number of [facility] [place] Treatment Team Plan encounters: 1/4  Treatment Team Attendees: Dr. Modesta Messing & Royal Piedra, LCSW  Diagnoses: No diagnosis found.  Goals, Interventions and Follow-up Plan Goals: Increase healthy adjustment to current life circumstances Interventions: Behavioral Activation CBT Cognitive Behavioral Therapy Medication Management Recommendations: no change in medication Follow-up Plan: biweekly vbh session  History of the present illness Presenting Problem/Current Symptoms: symptoms of anxiety  Psychiatric History  Depression: Yes Anxiety: Yes Mania: No Psychosis: No PTSD symptoms: No  Past Psychiatric History/Hospitalization(s): Hospitalization for psychiatric illness: No Prior Suicide Attempts: No Prior Self-injurious behavior: No  Psychosocial stressors children  Self-harm Behaviors Risk Assessment   Screenings PHQ-9 Assessments:  Depression screen [facility] 2/9 04/27/2021 04/15/2021 02/18/2021  Decreased Interest 3 3 3   Down, Depressed, Hopeless 2 3 3   PHQ - 2 Score 5 6 6   Altered sleeping 2 1 -  Tired, decreased energy 3 0 -  Change in appetite 2 1 -  Feeling bad or failure about yourself  2 0 -  Trouble concentrating 3 0 -  Moving slowly or fidgety/restless 1 0 -  Suicidal thoughts 0 0 -  PHQ-9 Score 18 8 -  Difficult doing work/chores Very difficult Somewhat difficult -  Some recent data might be hidden   GAD-7 Assessments:  GAD 7 : Generalized Anxiety Score 04/27/2021 04/15/2021 01/24/2021 11/02/2020  Nervous, Anxious, on Edge 2 3 3 3   Control/stop worrying 2 2 2 3   Worry too much - different things 2 3 2 3   Trouble relaxing 2 3 0 3  Restless 2 3 0 3  Easily annoyed or irritable 3 3 3 3   Afraid - awful might happen 1 0 0 1  Total GAD 7 Score 14 17 10 19   Anxiety Difficulty  Somewhat difficult Very difficult - Somewhat difficult    Past Medical History Past Medical History:  Diagnosis Date   Anxiety    Depression 2002   Depression    Phreesia 06/28/2020   Depression    Phreesia 07/23/2020   Fibromyalgia    Hypertension 2004   Medial meniscus tear 04/2013   left   Migraines     Vital signs: There were no vitals filed for this visit.  Allergies:  Allergies as of 04/27/2021 - Review Complete 04/15/2021  Allergen Reaction Noted   Lisinopril Cough 06/26/2018   Maxzide [hydrochlorothiazide w-triamterene] Itching 07/16/2019   Other Rash 06/28/2020   Buspar [buspirone]  07/16/2019   Beano meltaways [alpha-d-galactosidase] Rash 09/23/2020   Crestor [rosuvastatin calcium] Rash 05/01/2019   Paxil [paroxetine] Other (See Comments) 12/07/2020   Simethicone Rash 09/23/2020    Medication History Current medications:  Outpatient Encounter Medications as of 04/27/2021  Medication Sig   amLODipine (NORVASC) 10 MG tablet Take one tablet by mouth at 10 pm every night for blood pressure   amLODipine (NORVASC) 2.5 MG tablet Take 1 tablet (2.5 mg total) by mouth daily.   fluticasone (FLONASE) 50 MCG/ACT nasal spray Place 2 sprays into both nostrils daily.   Misc Natural Products (AIRBORNE ELDERBERRY) CHEW Chew 1 each by mouth daily.   Multiple Vitamins-Minerals (HAIR SKIN AND NAILS FORMULA) TABS Take 3 tablets by mouth daily.   PARoxetine (PAXIL) 30 MG tablet Take 1 tablet (30 mg total) by mouth daily.   Specialty Vitamins Products (COLLAGEN ULTRA) CAPS  Take 1 each by mouth daily.   Zinc Sulfate (ZINC 15 PO) Take by mouth. Daily   No facility-administered encounter medications on file as of 04/27/2021.     Scribe for Treatment Team: Lubertha South, LCSW

## 2021-04-27 NOTE — Progress Notes (Signed)
[facility] Health [facility] ([place]) Psychiatric Consultant Case Review   Shirley Bush is a 65 y.o. year old female with a history of depression hypertension. Depressive symptoms in the context of conflict with her children. She takes care of her grandchildren at home. Divorced, and her ex husband deceased in October 22, 2015. Paxil was uptitrated by her PCP.   Assessment/Provisional Diagnosis # MDD Continue current dose of Paxil given it has been uptitrated recently.   Recommendation - continue Paxil 30 mg daily  - BH specialist to work on behavioral activation, problem solving therapy   Thank you for your consult. We will continue to follow the patient. Please contact [place]  for any questions or concerns.   The above treatment considerations and suggestions are based on consultation with the [facility] specialist and/or PCP and a review of information available in the shared registry and the patient's [place] Record (EHR). I have not personally examined the patient. All recommendations should be implemented with consideration of the patient's relevant prior history and current clinical status. Please feel free to call me with any questions about the care of this patient.

## 2021-05-11 ENCOUNTER — Other Ambulatory Visit: Payer: Self-pay

## 2021-05-11 ENCOUNTER — Ambulatory Visit (INDEPENDENT_AMBULATORY_CARE_PROVIDER_SITE_OTHER): Payer: Medicare Other | Admitting: Licensed Clinical Social Worker

## 2021-05-11 DIAGNOSIS — F411 Generalized anxiety disorder: Secondary | ICD-10-CM

## 2021-05-31 ENCOUNTER — Other Ambulatory Visit (HOSPITAL_COMMUNITY): Payer: Self-pay | Admitting: Family Medicine

## 2021-05-31 DIAGNOSIS — Z1231 Encounter for screening mammogram for malignant neoplasm of breast: Secondary | ICD-10-CM

## 2021-05-31 NOTE — Progress Notes (Signed)
No show

## 2021-06-10 ENCOUNTER — Ambulatory Visit (INDEPENDENT_AMBULATORY_CARE_PROVIDER_SITE_OTHER): Payer: Medicare Other | Admitting: Family Medicine

## 2021-06-10 ENCOUNTER — Encounter: Payer: Self-pay | Admitting: Family Medicine

## 2021-06-10 ENCOUNTER — Other Ambulatory Visit: Payer: Self-pay

## 2021-06-10 VITALS — BP 150/94 | HR 90 | Resp 15 | Ht 63.0 in | Wt 158.1 lb

## 2021-06-10 DIAGNOSIS — F331 Major depressive disorder, recurrent, moderate: Secondary | ICD-10-CM | POA: Diagnosis not present

## 2021-06-10 DIAGNOSIS — F322 Major depressive disorder, single episode, severe without psychotic features: Secondary | ICD-10-CM

## 2021-06-10 DIAGNOSIS — I10 Essential (primary) hypertension: Secondary | ICD-10-CM | POA: Diagnosis not present

## 2021-06-10 DIAGNOSIS — E785 Hyperlipidemia, unspecified: Secondary | ICD-10-CM

## 2021-06-10 DIAGNOSIS — M7989 Other specified soft tissue disorders: Secondary | ICD-10-CM

## 2021-06-10 MED ORDER — PAROXETINE HCL 20 MG PO TABS: 20.0000 mg | ORAL_TABLET | Freq: Every day | ORAL | 3 refills | Status: DC

## 2021-06-10 MED ORDER — HYDRALAZINE HCL 10 MG PO TABS: ORAL_TABLET | ORAL | 2 refills | Status: DC

## 2021-06-10 NOTE — Patient Instructions (Addendum)
F/Uen d January, call if you need me before  New additional  medication for BP is hydralazine 10mg   take one at 9am and one at 9pm, continue amlodipine 10 mg  one daily as before  You are referred to therapist again, and lower dose paxil which you tolerate is prescribed   Cut salt intake    It is important that you exercise regularly at least 30 minutes 5 times a week. If you develop chest pain, have severe difficulty breathing, or feel very tired, stop exercising immediately and seek medical attention    Thanks for choosing [facility], we consider it a privelige to serve you.

## 2021-06-10 NOTE — Progress Notes (Signed)
   MARGIT BATTE     MRN: [id number]      DOB: [birth year]   HPI Ms. Papin is here for follow up and re-evaluation of chronic medical conditions, medication management and review of any available recent lab and radiology data.  Stopped both paxil and amlodipine 2.5 mg dose c/o swelling of feet, which persits   ROS Denies recent fever or chills. Denies sinus pressure, nasal congestion, ear pain or sore throat. Denies chest congestion, productive cough or wheezing. Denies chest pains, palpitations  Denies abdominal pain, nausea, vomiting,diarrhea or constipation.   Denies dysuria, frequency, hesitancy or incontinence. Denies skin break down or rash.   PE  BP (!) 169/88   Pulse 90   Resp 15   Ht 5\' 3"  (1.6 m)   Wt 158 lb 1.9 oz (71.7 kg)   SpO2 97%   BMI 28.01 kg/m   Patient alert and oriented and in no cardiopulmonary distress.  HEENT: No facial asymmetry, EOMI,     Neck supple .  Chest: Clear to auscultation bilaterally.  CVS: S1, S2 no murmurs, no S3.Regular rate.  ABD: Soft non tender.   Ext: trace edema  MS: Adequate ROM spine, shoulders, hips and knees.  Skin: Intact, no ulcerations or rash noted.  Psych: Good eye contact, normal affect. Memory intact not anxious or depressed appearing.  CNS: CN 2-12 intact, power,  normal throughout.no focal deficits noted.   Assessment & Plan  Malignant hypertension DASH diet and commitment to daily physical activity for a minimum of 30 minutes discussed and encouraged, as a part of hypertension management. The importance of attaining a healthy weight is also discussed.  BP/Weight 06/10/2021 04/15/2021 02/18/2021 02/03/2021 01/24/2021 12/07/2020 3/55/9741  Systolic BP 638 453 646 803 212 248 250  Diastolic BP 94 89 93 86 94 105 90  Wt. (Lbs) 158.12 152 151.75 152.8 152 156.4 157  BMI 28.01 26.93 26.88 27.07 26.93 27.71 27.81  Some encounter information is confidential and restricted. Go to Review Flowsheets activity to see  all data.     Uncontrolled start twice daily hydralazine  MDD (major depressive disorder), recurrent episode, moderate (HCC) Uncontrolled resume lower dose paxil 20 mg, refer to therapy  Leg swelling Trace edema on exam advised low salt intake , and leg elevation  Hyperlipidemia Hyperlipidemia:Low fat diet discussed and encouraged.   Lipid Panel  Lab Results  Component Value Date   CHOL 221 (H) 01/24/2021   HDL 58 01/24/2021   LDLCALC 142 (H) 01/24/2021   TRIG 119 01/24/2021   CHOLHDL 3.8 01/24/2021   Needs to lower fat intake

## 2021-06-12 ENCOUNTER — Encounter: Payer: Self-pay | Admitting: Family Medicine

## 2021-06-12 DIAGNOSIS — R609 Edema, unspecified: Secondary | ICD-10-CM | POA: Insufficient documentation

## 2021-06-12 DIAGNOSIS — M7989 Other specified soft tissue disorders: Secondary | ICD-10-CM | POA: Insufficient documentation

## 2021-06-12 NOTE — Assessment & Plan Note (Signed)
DASH diet and commitment to daily physical activity for a minimum of 30 minutes discussed and encouraged, as a part of hypertension management. The importance of attaining a healthy weight is also discussed.  BP/Weight 06/10/2021 04/15/2021 02/18/2021 02/03/2021 01/24/2021 12/07/2020 8/61/4830  Systolic BP 735 430 148 403 979 536 922  Diastolic BP 94 89 93 86 94 105 90  Wt. (Lbs) 158.12 152 151.75 152.8 152 156.4 157  BMI 28.01 26.93 26.88 27.07 26.93 27.71 27.81  Some encounter information is confidential and restricted. Go to Review Flowsheets activity to see all data.     Uncontrolled start twice daily hydralazine

## 2021-06-12 NOTE — Assessment & Plan Note (Signed)
Hyperlipidemia:Low fat diet discussed and encouraged.   Lipid Panel  Lab Results  Component Value Date   CHOL 221 (H) 01/24/2021   HDL 58 01/24/2021   LDLCALC 142 (H) 01/24/2021   TRIG 119 01/24/2021   CHOLHDL 3.8 01/24/2021   Needs to lower fat intake

## 2021-06-12 NOTE — Assessment & Plan Note (Signed)
Uncontrolled resume lower dose paxil 20 mg, refer to therapy

## 2021-06-12 NOTE — Assessment & Plan Note (Signed)
Trace edema on exam advised low salt intake , and leg elevation

## 2021-06-20 ENCOUNTER — Ambulatory Visit (INDEPENDENT_AMBULATORY_CARE_PROVIDER_SITE_OTHER): Payer: Medicare Other | Admitting: Internal Medicine

## 2021-06-20 ENCOUNTER — Encounter: Payer: Self-pay | Admitting: Internal Medicine

## 2021-06-20 ENCOUNTER — Other Ambulatory Visit: Payer: Self-pay

## 2021-06-20 DIAGNOSIS — J0110 Acute frontal sinusitis, unspecified: Secondary | ICD-10-CM

## 2021-06-20 MED ORDER — AMOXICILLIN-POT CLAVULANATE 875-125 MG PO TABS
1.0000 | ORAL_TABLET | Freq: Two times a day (BID) | ORAL | 0 refills | Status: DC
Start: 2021-06-20 — End: 2021-08-01

## 2021-06-20 NOTE — Progress Notes (Signed)
Virtual Visit via Telephone Note   This visit type was conducted due to national recommendations for restrictions regarding the COVID-19 Pandemic (e.g. social distancing) in an effort to limit this patient's exposure and mitigate transmission in our community.  Due to her co-morbid illnesses, this patient is at least at moderate risk for complications without adequate follow up.  This format is felt to be most appropriate for this patient at this time.  The patient did not have access to video technology/had technical difficulties with video requiring transitioning to audio format only (telephone).  All issues noted in this document were discussed and addressed.  No physical exam could be performed with this format.  Evaluation Performed:  Follow-up visit  Date:  06/20/2021   ID:  Shirley, Bush 1955/11/28, MRN [id number]  Patient Location: Home Provider Location: Office/Clinic  Participants: Patient Location of Patient: Home Location of Provider: Telehealth Consent was obtain for visit to be over via telehealth. I verified that I am speaking with the correct person using two identifiers.  PCP:  Fayrene Helper, MD   Chief Complaint: Nasal congestion and sinus pressure related headache  History of Present Illness:    Shirley Bush is a 65 y.o. female who has televisit for complaint of nasal congestion and sinus pressure related headache for last 4-5 days.  She has tried NyQuil with no relief.  She also reports greenish phlegm and bright red blood clumps in the nasal discharge.  She denies any fever, chills, dyspnea or wheezing currently.  She has been using Flonase for allergies.  She has had COVID and flu vaccines.  Her son and grand daughter were also sick with similar symptoms and tested negative for COVID and flu.  The patient does not have symptoms concerning for COVID-19 infection (fever, chills, cough, or new shortness of breath).   Past Medical, Surgical, Social  History, Allergies, and Medications have been Reviewed.  Past Medical History:  Diagnosis Date   Anxiety    Depression 2002   Depression    Phreesia 06/28/2020   Depression    Phreesia 07/23/2020   Fibromyalgia    Hypertension 2004   Medial meniscus tear 04/2013   left   Migraines    Past Surgical History:  Procedure Laterality Date   CESAREAN SECTION  [place]   CESAREAN SECTION N/A    Phreesia 06/28/2020   COLONOSCOPY  02/05/2009   COLONOSCOPY N/A 03/06/2014   Procedure: COLONOSCOPY;  Surgeon: Danie Binder, MD;  [facility];  Service: Endoscopy;  Laterality: N/A;  10:30 AM   COLONOSCOPY WITH PROPOFOL N/A 01/14/2019   Surgeon: Danie Binder, MD;  one 3 mm tubular adenoma in the mid ascending colon, moderate diverticulosis in entire colon, external and internal hemorrhoids, tortuous left colon. Recommended repeat in 5 years.    FINGER SURGERY Right    long finger   HERNIA REPAIR N/A    Phreesia 06/28/2020   JOINT REPLACEMENT N/A    Phreesia 06/28/2020   KNEE ARTHROSCOPY WITH MEDIAL MENISECTOMY Left 04/24/2013   Procedure: LEFT KNEE ARTHROSCOPY CHONDROPLASTY WITH MEDIAL MENISECTOMY;  Surgeon: Ninetta Lights, MD;  Location: [facility];  Service: Orthopedics;  Laterality: Left;   SHOULDER ARTHROSCOPY Left 2009   SHOULDER ARTHROSCOPY Right    SHOULDER ARTHROSCOPY WITH ROTATOR CUFF REPAIR Left 11/25/2004   TOTAL KNEE ARTHROPLASTY Left 11/11/2014   Procedure: LEFT TOTAL  KNEE ARTHROPLASTY;  Surgeon: Kathryne Hitch, MD;  Location: [facility];  Service: Orthopedics;  Laterality: Left;   TUBAL LIGATION  8588   UMBILICAL HERNIA REPAIR  2008     Current Meds  Medication Sig   amLODipine (NORVASC) 10 MG tablet Take one tablet by mouth at 10 pm every night for blood pressure   amoxicillin-clavulanate (AUGMENTIN) 875-125 MG tablet Take 1 tablet by mouth 2 (two) times daily.   fluticasone (FLONASE) 50 MCG/ACT nasal  spray Place 2 sprays into both nostrils daily.   hydrALAZINE (APRESOLINE) 10 MG tablet Take one tablet by mouth two times daily, 9 am and 9 pm   Misc Natural Products (AIRBORNE ELDERBERRY) CHEW Chew 1 each by mouth daily.   Multiple Vitamins-Minerals (HAIR SKIN AND NAILS FORMULA) TABS Take 3 tablets by mouth daily.   PARoxetine (PAXIL) 20 MG tablet Take 1 tablet (20 mg total) by mouth daily.   Specialty Vitamins Products (COLLAGEN ULTRA) CAPS Take 1 each by mouth daily.   Zinc Sulfate (ZINC 15 PO) Take by mouth. Daily     Allergies:   Lisinopril, Maxzide [hydrochlorothiazide w-triamterene], Other, Buspar [buspirone], Beano meltaways [alpha-d-galactosidase], Crestor [rosuvastatin calcium], Paxil [paroxetine], and Simethicone   ROS:   Please see the history of present illness.     All other systems reviewed and are negative.   Labs/Other Tests and Data Reviewed:    Recent Labs: 01/24/2021: ALT 24; BUN 7; Creatinine, Ser 0.81; Hemoglobin 13.5; Platelets 398; Potassium 4.5; Sodium 141; TSH 3.930   Recent Lipid Panel Lab Results  Component Value Date/Time   CHOL 221 (H) 01/24/2021 09:16 AM   TRIG 119 01/24/2021 09:16 AM   HDL 58 01/24/2021 09:16 AM   CHOLHDL 3.8 01/24/2021 09:16 AM   CHOLHDL 4.4 10/03/2019 08:09 AM   LDLCALC 142 (H) 01/24/2021 09:16 AM   LDLCALC 148 (H) 10/03/2019 08:09 AM    Wt Readings from Last 3 Encounters:  06/10/21 158 lb 1.9 oz (71.7 kg)  04/15/21 152 lb (68.9 kg)  02/18/21 151 lb 12 oz (68.8 kg)     ASSESSMENT & PLAN:    Acute sinusitis Started Augmentin as she has persistent symptoms despite symptomatic treatment with NyQuil Continue Flonase for allergies Nasal saline spray as needed for nasal congestion  Time:   Today, I have spent 9 minutes reviewing the chart, including problem list, medications, and with the patient with telehealth technology discussing the above problems.   Medication Adjustments/Labs and Tests Ordered: Current medicines  are reviewed at length with the patient today.  Concerns regarding medicines are outlined above.   Tests Ordered: No orders of the defined types were placed in this encounter.   Medication Changes: Meds ordered this encounter  Medications   amoxicillin-clavulanate (AUGMENTIN) 875-125 MG tablet    Sig: Take 1 tablet by mouth 2 (two) times daily.    Dispense:  14 tablet    Refill:  0     Note: This dictation was prepared with Dragon dictation along with smaller phrase technology. Similar sounding words can be transcribed inadequately or may not be corrected upon review. Any transcriptional errors that result from this process are unintentional.      Disposition:  Follow up  Signed, Lindell Spar, MD  06/20/2021 2:42 PM     [facility]

## 2021-06-24 ENCOUNTER — Telehealth: Payer: Self-pay

## 2021-06-24 NOTE — Telephone Encounter (Signed)
Pt called and said she is having gland swelling as reaction to new med.  Can you call her.

## 2021-06-24 NOTE — Telephone Encounter (Signed)
Stopped the amoxicillin due to side effects and will call back if she starts feeling bad again

## 2021-06-29 ENCOUNTER — Ambulatory Visit (INDEPENDENT_AMBULATORY_CARE_PROVIDER_SITE_OTHER): Payer: Medicare Other | Admitting: Licensed Clinical Social Worker

## 2021-06-29 ENCOUNTER — Other Ambulatory Visit: Payer: Self-pay

## 2021-06-29 DIAGNOSIS — F331 Major depressive disorder, recurrent, moderate: Secondary | ICD-10-CM

## 2021-06-29 DIAGNOSIS — F411 Generalized anxiety disorder: Secondary | ICD-10-CM

## 2021-07-13 ENCOUNTER — Ambulatory Visit (INDEPENDENT_AMBULATORY_CARE_PROVIDER_SITE_OTHER): Payer: Medicare Other | Admitting: Licensed Clinical Social Worker

## 2021-07-13 ENCOUNTER — Other Ambulatory Visit: Payer: Self-pay

## 2021-07-13 DIAGNOSIS — F411 Generalized anxiety disorder: Secondary | ICD-10-CM

## 2021-07-13 DIAGNOSIS — F322 Major depressive disorder, single episode, severe without psychotic features: Secondary | ICD-10-CM

## 2021-07-19 NOTE — BH Specialist Note (Signed)
[facility] Follow Up Assessment  MRN: [id number] NAME: Shirley Bush Date: 07/19/21  Start time: 77 End time: 1135 Total time:  68min  Type of Contact: Follow up Call  Current concerns/stressors: anxiety & depressive symptoms   Functional Assessment:  Sleep: poor Appetite: poor Coping ability: overwhelmed Patient taking medications as prescribed:  yes  Current medications:  Outpatient Encounter Medications as of 07/13/2021  Medication Sig   amLODipine (NORVASC) 10 MG tablet Take one tablet by mouth at 10 pm every night for blood pressure   amoxicillin-clavulanate (AUGMENTIN) 875-125 MG tablet Take 1 tablet by mouth 2 (two) times daily.   fluticasone (FLONASE) 50 MCG/ACT nasal spray Place 2 sprays into both nostrils daily.   hydrALAZINE (APRESOLINE) 10 MG tablet Take one tablet by mouth two times daily, 9 am and 9 pm   Misc Natural Products (AIRBORNE ELDERBERRY) CHEW Chew 1 each by mouth daily.   Multiple Vitamins-Minerals (HAIR SKIN AND NAILS FORMULA) TABS Take 3 tablets by mouth daily.   PARoxetine (PAXIL) 20 MG tablet Take 1 tablet (20 mg total) by mouth daily.   Specialty Vitamins Products (COLLAGEN ULTRA) CAPS Take 1 each by mouth daily.   Zinc Sulfate (ZINC 15 PO) Take by mouth. Daily   No facility-administered encounter medications on file as of 07/13/2021.    Self-harm and/or Suicidal Behaviors Risk Assessment Self-harm risk factors: no Patient endorses recent self injurious thoughts and/or behaviors: No   Suicide ideations: No plan to harm self or others   Danger to Others Risk Assessment Danger to others risk factors: no Patient endorses recent thoughts of harming others: No    Substance Use Assessment Patient recently consumed alcohol: No  Patient recently used drugs: No  Patient is concerned about dependence or abuse of substances: No    Goals, Interventions and Follow-up Plan Goals: Increase healthy adjustment to current life  circumstances Interventions: CBT Cognitive Behavioral Therapy   Summary of Clinical Assessment  Patient requesting in person therapy.  Will refer to psychology    Follow-up Plan:  Referral to in person psychotherapy Lubertha South, LCSW

## 2021-07-19 NOTE — Progress Notes (Signed)
Bh specialist note

## 2021-07-20 ENCOUNTER — Other Ambulatory Visit: Payer: Self-pay

## 2021-07-20 ENCOUNTER — Ambulatory Visit (HOSPITAL_COMMUNITY)
Admission: RE | Admit: 2021-07-20 | Discharge: 2021-07-20 | Disposition: A | Discharge status: Home / Self Care | Payer: Medicare Other | Source: Ambulatory Visit | Attending: Family Medicine | Admitting: Family Medicine

## 2021-07-20 DIAGNOSIS — Z1231 Encounter for screening mammogram for malignant neoplasm of breast: Secondary | ICD-10-CM | POA: Diagnosis not present

## 2021-07-27 ENCOUNTER — Telehealth (INDEPENDENT_AMBULATORY_CARE_PROVIDER_SITE_OTHER): Payer: Medicare Other | Admitting: Licensed Clinical Social Worker

## 2021-07-27 DIAGNOSIS — F322 Major depressive disorder, single episode, severe without psychotic features: Secondary | ICD-10-CM

## 2021-07-27 DIAGNOSIS — F411 Generalized anxiety disorder: Secondary | ICD-10-CM

## 2021-07-27 NOTE — Progress Notes (Signed)
[facility] ([place]) Psychiatric Consultant Case Review     Shirley Bush is a 66 y.o. year old female with a history of depression hypertension. Depressive symptoms in the context of conflict with her children. She takes care of her grandchildren at home. Divorced, and her ex husband deceased in 10/11/2015. She requested to be referred to individual therapy.   Assessment/Provisional Diagnosis # MDD Continue current dose of Paxil. Will plan to sign off based on the patient request.      Recommendation - continue Paxil 30 mg daily    Thank you for your consult. We will sign off. Please contact [facility]  for any questions or concerns.   The above treatment considerations and suggestions are based on consultation with the [facility] specialist and/or PCP and a review of information available in the shared registry and the patients [place] (EHR). I have not personally examined the patient. All recommendations should be implemented with consideration of the patient's relevant prior history and current clinical status. Please feel free to call me with any questions about the care of this patient.

## 2021-07-27 NOTE — BH Specialist Note (Signed)
Virtual Behavioral Health Treatment Plan Team Note  MRN: [id number] NAME: Shirley Bush  DATE: 07/27/21  Start time:   250p End time:  255p Total time:  5 min  Total number of Virtual [place] Treatment Team Plan encounters: 3/4  Treatment Team Attendees: Royal Piedra, LCSW & Dr. Modesta Messing  Diagnoses: No diagnosis found.  Goals, Interventions and Follow-up Plan Goals: Increase healthy adjustment to current life circumstances Interventions: CBT Cognitive Behavioral Therapy Medication Management Recommendations: we will sign off Follow-up Plan: Referral to in person psychotherapy  History of the present illness Presenting Problem/Current Symptoms: depressive and anxiety symptoms  Psychiatric History  Depression: Yes Anxiety: Yes Mania: No Psychosis: No PTSD symptoms: No  Past Psychiatric History/Hospitalization(s): Hospitalization for psychiatric illness: No Prior Suicide Attempts: No Prior Self-injurious behavior: No  Psychosocial stressors anxiousness and depressed mood  Self-harm Behaviors Risk Assessment   Screenings PHQ-9 Assessments:  Depression screen [facility] 2/9 06/20/2021 06/10/2021 04/27/2021  Decreased Interest 0 3 3  Down, Depressed, Hopeless 0 3 2  PHQ - 2 Score 0 6 5  Altered sleeping 0 3 2  Tired, decreased energy 0 3 3  Change in appetite 0 - 2  Feeling bad or failure about yourself  0 1 2  Trouble concentrating 0 0 3  Moving slowly or fidgety/restless 0 3 1  Suicidal thoughts 0 0 0  PHQ-9 Score 0 16 18  Difficult doing work/chores Not difficult at all - Very difficult  Some recent data might be hidden   GAD-7 Assessments:  GAD 7 : Generalized Anxiety Score 04/27/2021 04/15/2021 01/24/2021 11/02/2020  Nervous, Anxious, on Edge 2 3 3 3   Control/stop worrying 2 2 2 3   Worry too much - different things 2 3 2 3   Trouble relaxing 2 3 0 3  Restless 2 3 0 3  Easily annoyed or irritable 3 3 3 3   Afraid - awful might happen 1 0 0 1  Total GAD 7 Score 14 17  10 19   Anxiety Difficulty Somewhat difficult Very difficult - Somewhat difficult    Past Medical History Past Medical History:  Diagnosis Date   Anxiety    Depression 2002   Depression    Phreesia 06/28/2020   Depression    Phreesia 07/23/2020   Fibromyalgia    Hypertension 2004   Medial meniscus tear 04/2013   left   Migraines     Vital signs: There were no vitals filed for this visit.  Allergies:  Allergies as of 07/27/2021 - Review Complete 06/20/2021  Allergen Reaction Noted   Lisinopril Cough 06/26/2018   Maxzide [hydrochlorothiazide w-triamterene] Itching 07/16/2019   Other Rash 06/28/2020   Buspar [buspirone]  07/16/2019   Beano meltaways [alpha-d-galactosidase] Rash 09/23/2020   Crestor [rosuvastatin calcium] Rash 05/01/2019   Paxil [paroxetine] Other (See Comments) 12/07/2020   Simethicone Rash 09/23/2020    Medication History Current medications:  Outpatient Encounter Medications as of 07/27/2021  Medication Sig   amLODipine (NORVASC) 10 MG tablet Take one tablet by mouth at 10 pm every night for blood pressure   amoxicillin-clavulanate (AUGMENTIN) 875-125 MG tablet Take 1 tablet by mouth 2 (two) times daily.   fluticasone (FLONASE) 50 MCG/ACT nasal spray Place 2 sprays into both nostrils daily.   hydrALAZINE (APRESOLINE) 10 MG tablet Take one tablet by mouth two times daily, 9 am and 9 pm   Misc Natural Products (AIRBORNE ELDERBERRY) CHEW Chew 1 each by mouth daily.   Multiple Vitamins-Minerals (HAIR SKIN AND NAILS FORMULA) TABS Take 3  tablets by mouth daily.   PARoxetine (PAXIL) 20 MG tablet Take 1 tablet (20 mg total) by mouth daily.   Specialty Vitamins Products (COLLAGEN ULTRA) CAPS Take 1 each by mouth daily.   Zinc Sulfate (ZINC 15 PO) Take by mouth. Daily   No facility-administered encounter medications on file as of 07/27/2021.     Scribe for Treatment Team: Lubertha South, LCSW

## 2021-08-01 ENCOUNTER — Ambulatory Visit (INDEPENDENT_AMBULATORY_CARE_PROVIDER_SITE_OTHER): Payer: Medicare Other

## 2021-08-01 ENCOUNTER — Other Ambulatory Visit: Payer: Self-pay

## 2021-08-01 DIAGNOSIS — Z0000 Encounter for general adult medical examination without abnormal findings: Secondary | ICD-10-CM | POA: Diagnosis not present

## 2021-08-01 NOTE — Progress Notes (Signed)
I connected with  Shirley Bush on 08/01/21 by a audio enabled telemedicine application and verified that I am speaking with the correct person using two identifiers.  Patient Location: Home  Provider Location: Home Office  I discussed the limitations of evaluation and management by telemedicine. The patient expressed understanding and agreed to proceed.   Subjective:   Shirley Bush is a 66 y.o. female who presents for Medicare Annual (Subsequent) preventive examination.  Review of Systems     Cardiac Risk Factors include: hypertension;sedentary lifestyle;dyslipidemia     Objective:    Today's Vitals   08/01/21 0923  PainSc: 0-No pain   There is no height or weight on file to calculate BMI.  Advanced Directives 08/01/2021 07/26/2020 01/14/2019 07/22/2018 11/11/2014 10/30/2014 03/06/2014  Does Patient Have a Medical Advance Directive? No No No No No No No  Would patient like information on creating a medical advance directive? Yes (ED - Information included in AVS) No - Patient declined No - Patient declined Yes (ED - Information included in AVS) Yes - Educational materials given Yes - Educational materials given No - patient declined information  Some encounter information is confidential and restricted. Go to Review Flowsheets activity to see all data.    Current Medications (verified) Outpatient Encounter Medications as of 08/01/2021  Medication Sig   fluticasone (FLONASE) 50 MCG/ACT nasal spray Place 2 sprays into both nostrils daily.   hydrALAZINE (APRESOLINE) 10 MG tablet Take one tablet by mouth two times daily, 9 am and 9 pm   Misc Natural Products (AIRBORNE ELDERBERRY) CHEW Chew 1 each by mouth daily.   Multiple Vitamins-Minerals (HAIR SKIN AND NAILS FORMULA) TABS Take 3 tablets by mouth daily.   Specialty Vitamins Products (COLLAGEN ULTRA) CAPS Take 1 each by mouth daily.   Zinc Sulfate (ZINC 15 PO) Take by mouth. Daily   [DISCONTINUED] amLODipine (NORVASC) 10 MG  tablet Take one tablet by mouth at 10 pm every night for blood pressure   [DISCONTINUED] amoxicillin-clavulanate (AUGMENTIN) 875-125 MG tablet Take 1 tablet by mouth 2 (two) times daily.   [DISCONTINUED] PARoxetine (PAXIL) 20 MG tablet Take 1 tablet (20 mg total) by mouth daily.   No facility-administered encounter medications on file as of 08/01/2021.    Allergies (verified) Lisinopril, Maxzide [hydrochlorothiazide w-triamterene], Other, Buspar [buspirone], Beano meltaways [alpha-d-galactosidase], Crestor [rosuvastatin calcium], Paxil [paroxetine], and Simethicone   History: Past Medical History:  Diagnosis Date   Allergy Don't remember   Don't remember   Anxiety    Arthritis In 2007   In both of my shoulders and my knees   Depression 2002   Depression    Phreesia 06/28/2020   Depression    Phreesia 07/23/2020   Fibromyalgia    Heart murmur Don't remember   I was in 20s   Hypertension 2004   Medial meniscus tear 04/2013   left   Migraines    Past Surgical History:  Procedure Laterality Date   [place] N/A    Phreesia 06/28/2020   COLONOSCOPY  02/05/2009   COLONOSCOPY N/A 03/06/2014   Procedure: COLONOSCOPY;  Surgeon: Danie Binder, MD;  [facility];  Service: Endoscopy;  Laterality: N/A;  10:30 AM   COLONOSCOPY WITH PROPOFOL N/A 01/14/2019   Surgeon: Danie Binder, MD;  one 3 mm tubular adenoma in the mid ascending colon, moderate diverticulosis in entire colon, external and  internal hemorrhoids, tortuous left colon. Recommended repeat in 5 years.    FINGER SURGERY Right    long finger   HERNIA REPAIR N/A    Phreesia 06/28/2020   JOINT REPLACEMENT N/A    Phreesia 06/28/2020   KNEE ARTHROSCOPY WITH MEDIAL MENISECTOMY Left 04/24/2013   Procedure: LEFT KNEE ARTHROSCOPY CHONDROPLASTY WITH MEDIAL MENISECTOMY;  Surgeon: Ninetta Lights, MD;  Location: [facility];  Service: Orthopedics;  Laterality: Left;   SHOULDER ARTHROSCOPY Left 2009   SHOULDER ARTHROSCOPY Right    SHOULDER ARTHROSCOPY WITH ROTATOR CUFF REPAIR Left 11/25/2004   TOTAL KNEE ARTHROPLASTY Left 11/11/2014   Procedure: LEFT TOTAL KNEE ARTHROPLASTY;  Surgeon: Kathryne Hitch, MD;  Location: [facility];  Service: Orthopedics;  Laterality: Left;   TUBAL LIGATION  5366   UMBILICAL HERNIA REPAIR  2008   Family History  Problem Relation Age of Onset   Hypertension Mother    Hypertension Father    Alcohol abuse Father    Seizures Father    Migraines Sister    Thyroid disease Sister    Colon cancer Sister        diagnosed in late 101s.    Thyroid disease Sister    Cancer Sister        breast    Cancer Maternal Aunt        gyne cancer   Cancer Sister    Social History   Socioeconomic History   Marital status: Divorced    Spouse name: Not on file   Number of children: 3   Years of education: 12 grade    Highest education level: 12th grade  Occupational History   Occupation: disability   Tobacco Use   Smoking status: Never   Smokeless tobacco: Never  Vaping Use   Vaping Use: Never used  Substance and Sexual Activity   Alcohol use: Not Currently    Comment: occasionally- rarely.    Drug use: No   Sexual activity: Not Currently    Birth control/protection: Abstinence, None  Other Topics Concern   Not on file  Social History Narrative   Son is living with her at the moment, patient states that she is isolating in her home and is depressed more lately    Social Determinants of Health   Financial Resource Strain: Not on file  Food Insecurity: Not on file  Transportation Needs: Not on file  Physical Activity: Not on file  Stress: Not on file  Social Connections: Not on file    Tobacco Counseling Counseling given: Not Answered   Clinical Intake:  Pre-visit preparation completed: Yes  Pain : No/denies pain Pain Score: 0-No pain     Nutritional Status: BMI  25 -29 Overweight Diabetes: No  How often do you need to have someone help you when you read instructions, pamphlets, or other written materials from your doctor or pharmacy?: 1 - Never  Diabetic?no  Interpreter Needed?: No      Activities of Daily Living In your present state of health, do you have any difficulty performing the following activities: 07/29/2021  Hearing? Y  Vision? Y  Difficulty concentrating or making decisions? Y  Walking or climbing stairs? N  Dressing or bathing? N  Doing errands, shopping? N  Preparing Food and eating ? N  Using the Toilet? N  In the past six months, have you accidently leaked urine? Y  Do you have problems with loss of bowel control? N  Managing your Medications? N  Managing your  Finances? N  Housekeeping or managing your Housekeeping? N  Some recent data might be hidden    Patient Care Team: Fayrene Helper, MD as PCP - General Norman Clay, MD as Consulting Physician (Psychiatry) Eloise Harman, DO as Consulting Physician (Gastroenterology)  Indicate any recent Medical Services you may have received from other than [facility] providers in the past year (date may be approximate).     Assessment:   This is a routine wellness examination for [place].  Hearing/Vision screen No results found.  Dietary issues and exercise activities discussed: Current Exercise Habits: The patient does not participate in regular exercise at present, Exercise limited by: Other - see comments (no motivation)   Goals Addressed             This Visit's Progress    Increase physical activity   Not on track    Patient Stated       Get referral to get hearing checked        Depression Screen PHQ 2/9 Scores 06/20/2021 06/10/2021 04/27/2021 04/15/2021 02/18/2021 01/24/2021 12/07/2020  PHQ - 2 Score 0 6 5 6 6 6 6   PHQ- 9 Score 0 16 18 8  - 11 16    Fall Risk Fall Risk  07/29/2021 06/20/2021 06/10/2021 04/15/2021 02/18/2021  Falls in the past year? 0  0 0 0 0  Number falls in past yr: - 0 0 0 0  Injury with Fall? - 0 0 0 0  Risk for fall due to : - No Fall Risks - - -  Follow up - Falls evaluation completed - - -    FALL RISK PREVENTION PERTAINING TO THE HOME:  Any stairs in or around the home? No  If so, are there any without handrails? No  Home free of loose throw rugs in walkways, pet beds, electrical cords, etc? Yes  Adequate lighting in your home to reduce risk of falls? Yes   ASSISTIVE DEVICES UTILIZED TO PREVENT FALLS:  Life alert? No  Use of a cane, walker or w/c? No  Grab bars in the bathroom? No  Shower chair or bench in shower? No  Elevated toilet seat or a handicapped toilet? No   TIMED UP AND GO:  Was the test performed? No .  Length of time to ambulate 10 feet:  sec.     Cognitive Function:     6CIT Screen 08/01/2021 07/26/2020 07/24/2019 07/22/2018  What Year? 0 points 0 points 0 points 0 points  What month? 0 points 0 points 0 points 0 points  What time? 0 points 0 points 0 points 0 points  Count back from 20 0 points 0 points 0 points 0 points  Months in reverse 0 points 0 points 0 points 0 points  Repeat phrase 0 points 0 points 0 points 4 points  Total Score 0 0 0 4    Immunizations Immunization History  Administered Date(s) Administered   Influenza,inj,Quad PF,6+ Mos 05/06/2014, 10/05/2015, 04/27/2016, 03/11/2018, 04/28/2019, 04/16/2020, 04/15/2021   Moderna SARS-COV2 Booster Vaccination 12/30/2020   Moderna Sars-Covid-2 Vaccination 09/18/2019, 10/21/2019, 05/21/2020, 04/15/2021   Td 05/17/2000   Tdap 02/10/2011    TDAP status: Due, Education has been provided regarding the importance of this vaccine. Advised may receive this vaccine at local pharmacy or Health Dept. Aware to provide a copy of the vaccination record if obtained from local pharmacy or Health Dept. Verbalized acceptance and understanding.  Flu Vaccine status: Up to date  Pneumococcal vaccine status: Up to date  Covid-19 vaccine  status: Completed vaccines  Qualifies for Shingles Vaccine? Yes   Zostavax completed No   Shingrix Completed?: No.    Education has been provided regarding the importance of this vaccine. Patient has been advised to call insurance company to determine out of pocket expense if they have not yet received this vaccine. Advised may also receive vaccine at local pharmacy or Health Dept. Verbalized acceptance and understanding.  Screening Tests Health Maintenance  Topic Date Due   Zoster Vaccines- Shingrix (1 of 2) Never done   TETANUS/TDAP  02/09/2021   COVID-19 Vaccine (5 - Booster for Moderna series) 06/10/2021   PAP SMEAR-Modifier  04/07/2022   MAMMOGRAM  07/21/2023   COLONOSCOPY (Pts 45-22yrs Insurance coverage will need to be confirmed)  01/13/2029   INFLUENZA VACCINE  Completed   Hepatitis C Screening  Completed   HIV Screening  Completed   Pneumococcal Vaccine 40-4 Years old  Aged Out   HPV Protivin Maintenance Due  Topic Date Due   Zoster Vaccines- Shingrix (1 of 2) Never done   TETANUS/TDAP  02/09/2021   COVID-19 Vaccine (5 - Booster for Moderna series) 06/10/2021    Colorectal cancer screening: Type of screening: Colonoscopy. Completed yes. Repeat every 2020 years  Mammogram status: Completed yes. Repeat every year    Lung Cancer Screening: (Low Dose CT Chest recommended if Age 74-80 years, 30 pack-year currently smoking OR have quit w/in 15years.) does not qualify.   Lung Cancer Screening Referral: n/a  Additional Screening:  Hepatitis C Screening: does not qualify; Completed yes  Vision Screening: Recommended annual ophthalmology exams for early detection of glaucoma and other disorders of the eye. Is the patient up to date with their annual eye exam?  No  Who is the provider or what is the name of the office in which the patient attends annual eye exams? [email or web address] If pt is not established with a provider, would they like  to be referred to a provider to establish care? No .   Dental Screening: Recommended annual dental exams for proper oral hygiene  Community Resource Referral / Chronic Care Management: CRR required this visit?  No   CCM required this visit?  No      Plan:     I have personally reviewed and noted the following in the patients chart:   Medical and social history Use of alcohol, tobacco or illicit drugs  Current medications and supplements including opioid prescriptions.  Functional ability and status Nutritional status Physical activity Advanced directives List of other physicians Hospitalizations, surgeries, and ER visits in previous 12 months Vitals Screenings to include cognitive, depression, and falls Referrals and appointments  In addition, I have reviewed and discussed with patient certain preventive protocols, quality metrics, and best practice recommendations. A written personalized care plan for preventive services as well as general preventive health recommendations were provided to patient.     Kate Sable, LPN, LPN   10/23/1446   Nurse Notes:  Ms. Espino , Thank you for taking time to come for your Medicare Wellness Visit. I appreciate your ongoing commitment to your health goals. Please review the following plan we discussed and let me know if I can assist you in the future.   These are the goals we discussed:  Goals      Activity and Exercise Increased     Evidence-based guidance:  Review current exercise levels.  Assess patient perspective on exercise or activity  level, barriers to increasing activity, motivation and readiness for change.  Recommend or set healthy exercise goal based on individual tolerance.  Encourage small steps toward making change in amount of exercise or activity.  Urge reduction of sedentary activities or screen time.  Promote group activities within the community or with family or support person.  Consider referral to rehabiliation  therapist for assessment and exercise/activity plan.   Notes: Wants to start exercising.      Increase physical activity     Patient Stated     Get referral to get hearing checked         This is a list of the screening recommended for you and due dates:  Health Maintenance  Topic Date Due   Zoster (Shingles) Vaccine (1 of 2) Never done   Tetanus Vaccine  02/09/2021   COVID-19 Vaccine (5 - Booster for Moderna series) 06/10/2021   Pap Smear  04/07/2022   Mammogram  07/21/2023   Colon Cancer Screening  01/13/2029   Flu Shot  Completed   Hepatitis C Screening: USPSTF Recommendation to screen - Ages 18-79 yo.  Completed   HIV Screening  Completed   Pneumococcal Vaccination  Aged Out   HPV Vaccine  Aged Out

## 2021-08-01 NOTE — Progress Notes (Signed)
rescheduled

## 2021-08-01 NOTE — Patient Instructions (Addendum)
Shirley Bush , Thank you for taking time to come for your Medicare Wellness Visit. I appreciate your ongoing commitment to your health goals. Please review the following plan we discussed and let me know if I can assist you in the future.   These are the goals we discussed:  Goals      Activity and Exercise Increased     Evidence-based guidance:  Review current exercise levels.  Assess patient perspective on exercise or activity level, barriers to increasing activity, motivation and readiness for change.  Recommend or set healthy exercise goal based on individual tolerance.  Encourage small steps toward making change in amount of exercise or activity.  Urge reduction of sedentary activities or screen time.  Promote group activities within the community or with family or support person.  Consider referral to rehabiliation therapist for assessment and exercise/activity plan.   Notes: Wants to start exercising.      Increase physical activity     Patient Stated     Get referral to get hearing checked         This is a list of the screening recommended for you and due dates:  Health Maintenance  Topic Date Due   Zoster (Shingles) Vaccine (1 of 2) Never done   Tetanus Vaccine  02/09/2021   COVID-19 Vaccine (5 - Booster for Moderna series) 06/10/2021   Pap Smear  04/07/2022   Mammogram  07/21/2023   Colon Cancer Screening  01/13/2029   Flu Shot  Completed   Hepatitis C Screening: USPSTF Recommendation to screen - Ages 18-79 yo.  Completed   HIV Screening  Completed   Pneumococcal Vaccination  Aged Out   HPV Vaccine  Aged Out      Health Maintenance, Female Adopting a healthy lifestyle and getting preventive care are important in promoting health and wellness. Ask your health care provider about: The right schedule for you to have regular tests and exams. Things you can do on your own to prevent diseases and keep yourself healthy. What should I know about diet, weight, and  exercise? Eat a healthy diet  Eat a diet that includes plenty of vegetables, fruits, low-fat dairy products, and lean protein. Do not eat a lot of foods that are high in solid fats, added sugars, or sodium. Maintain a healthy weight Body mass index (BMI) is used to identify weight problems. It estimates body fat based on height and weight. Your health care provider can help determine your BMI and help you achieve or maintain a healthy weight. Get regular exercise Get regular exercise. This is one of the most important things you can do for your health. Most adults should: Exercise for at least 150 minutes each week. The exercise should increase your heart rate and make you sweat (moderate-intensity exercise). Do strengthening exercises at least twice a week. This is in addition to the moderate-intensity exercise. Spend less time sitting. Even light physical activity can be beneficial. Watch cholesterol and blood lipids Have your blood tested for lipids and cholesterol at 64 years of age, then have this test every 5 years. Have your cholesterol levels checked more often if: Your lipid or cholesterol levels are high. You are older than 64 years of age. You are at high risk for heart disease. What should I know about cancer screening? Depending on your health history and family history, you may need to have cancer screening at various ages. This may include screening for: Breast cancer. Cervical cancer. Colorectal cancer. Skin cancer.  Lung cancer. What should I know about heart disease, diabetes, and high blood pressure? Blood pressure and heart disease High blood pressure causes heart disease and increases the risk of stroke. This is more likely to develop in people who have high blood pressure readings or are overweight. Have your blood pressure checked: Every 3-5 years if you are 58-64 years of age. Every year if you are 47 years old or older. Diabetes Have regular diabetes  screenings. This checks your fasting blood sugar level. Have the screening done: Once every three years after age 67 if you are at a normal weight and have a low risk for diabetes. More often and at a younger age if you are overweight or have a high risk for diabetes. What should I know about preventing infection? Hepatitis B If you have a higher risk for hepatitis B, you should be screened for this virus. Talk with your health care provider to find out if you are at risk for hepatitis B infection. Hepatitis C Testing is recommended for: Everyone born from [birth year] through 1963. Anyone with known risk factors for hepatitis C. Sexually transmitted infections (STIs) Get screened for STIs, including gonorrhea and chlamydia, if: You are sexually active and are younger than 64 years of age. You are older than 64 years of age and your health care provider tells you that you are at risk for this type of infection. Your sexual activity has changed since you were last screened, and you are at increased risk for chlamydia or gonorrhea. Ask your health care provider if you are at risk. Ask your health care provider about whether you are at high risk for HIV. Your health care provider may recommend a prescription medicine to help prevent HIV infection. If you choose to take medicine to prevent HIV, you should first get tested for HIV. You should then be tested every 3 months for as long as you are taking the medicine. Pregnancy If you are about to stop having your period (premenopausal) and you may become pregnant, seek counseling before you get pregnant. Take 400 to 800 micrograms (mcg) of folic acid every day if you become pregnant. Ask for birth control (contraception) if you want to prevent pregnancy. Osteoporosis and menopause Osteoporosis is a disease in which the bones lose minerals and strength with aging. This can result in bone fractures. If you are 32 years old or older, or if you are at risk for  osteoporosis and fractures, ask your health care provider if you should: Be screened for bone loss. Take a calcium or vitamin D supplement to lower your risk of fractures. Be given hormone replacement therapy (HRT) to treat symptoms of menopause. Follow these instructions at home: Alcohol use Do not drink alcohol if: Your health care provider tells you not to drink. You are pregnant, may be pregnant, or are planning to become pregnant. If you drink alcohol: Limit how much you have to: 0-1 drink a day. Know how much alcohol is in your drink. In the [place] one drink equals one 12 oz bottle of beer (355 mL), one 5 oz glass of wine (148 mL), or one 1 oz glass of hard liquor (44 mL). Lifestyle Do not use any products that contain nicotine or tobacco. These products include cigarettes, chewing tobacco, and vaping devices, such as e-cigarettes. If you need help quitting, ask your health care provider. Do not use street drugs. Do not share needles. Ask your health care provider for help if you need support  or information about quitting drugs. General instructions Schedule regular health, dental, and eye exams. Stay current with your vaccines. Tell your health care provider if: You often feel depressed. You have ever been abused or do not feel safe at home. Summary Adopting a healthy lifestyle and getting preventive care are important in promoting health and wellness. Follow your health care provider's instructions about healthy diet, exercising, and getting tested or screened for diseases. Follow your health care provider's instructions on monitoring your cholesterol and blood pressure. This information is not intended to replace advice given to you by your health care provider. Make sure you discuss any questions you have with your health care provider. Document Revised: 11/22/2020 Document Reviewed: 11/22/2020 Elsevier Patient Education  [employer].

## 2021-08-11 ENCOUNTER — Other Ambulatory Visit: Payer: Self-pay

## 2021-08-11 ENCOUNTER — Ambulatory Visit (INDEPENDENT_AMBULATORY_CARE_PROVIDER_SITE_OTHER): Payer: Medicare Other | Admitting: Family Medicine

## 2021-08-11 ENCOUNTER — Encounter: Payer: Self-pay | Admitting: Family Medicine

## 2021-08-11 VITALS — BP 180/104 | HR 93 | Ht 63.0 in | Wt 157.1 lb

## 2021-08-11 DIAGNOSIS — H9192 Unspecified hearing loss, left ear: Secondary | ICD-10-CM | POA: Diagnosis not present

## 2021-08-11 DIAGNOSIS — H9202 Otalgia, left ear: Secondary | ICD-10-CM | POA: Diagnosis not present

## 2021-08-11 DIAGNOSIS — I10 Essential (primary) hypertension: Secondary | ICD-10-CM

## 2021-08-11 DIAGNOSIS — F411 Generalized anxiety disorder: Secondary | ICD-10-CM

## 2021-08-11 DIAGNOSIS — F331 Major depressive disorder, recurrent, moderate: Secondary | ICD-10-CM | POA: Diagnosis not present

## 2021-08-11 DIAGNOSIS — L659 Nonscarring hair loss, unspecified: Secondary | ICD-10-CM | POA: Diagnosis not present

## 2021-08-11 MED ORDER — HYDRALAZINE HCL 25 MG PO TABS: 25.0000 mg | ORAL_TABLET | Freq: Three times a day (TID) | ORAL | 1 refills | Status: DC

## 2021-08-11 NOTE — Patient Instructions (Addendum)
F/u in 5 to 6 weeks re eval blood pressure, call if you need me sooner  New higher dose of hydralazine at 6 am, 2pm and 10 pm  You will be referred to therapist and also to dermatology in [place] as discussed also to ENT for left ear symptoms  Thanks for choosing [facility], we consider it a privelige to serve you.

## 2021-08-12 ENCOUNTER — Ambulatory Visit: Payer: Medicare Other | Admitting: Gastroenterology

## 2021-08-15 ENCOUNTER — Encounter: Payer: Self-pay | Admitting: Family Medicine

## 2021-08-15 DIAGNOSIS — H9190 Unspecified hearing loss, unspecified ear: Secondary | ICD-10-CM | POA: Insufficient documentation

## 2021-08-15 DIAGNOSIS — H9202 Otalgia, left ear: Secondary | ICD-10-CM | POA: Insufficient documentation

## 2021-08-15 NOTE — Assessment & Plan Note (Signed)
C/o left hearing loss and fullness in left ear, refer to ENT for E/M

## 2021-08-15 NOTE — Progress Notes (Signed)
° °  Shirley Bush     MRN: [id number]      DOB: 12-Sep-1955   HPI Shirley Bush is here for follow up and re-evaluation of chronic medical conditions, medication management and review of any available recent lab and radiology data.  Reports blood pressure is running high and she will work with medication to improve this C/o excessive stress in hr life which is chronic, interested in therapy at this time C/o hair loss wants 2nd opinion re management, has been seen locally, no interest in topical meds only c/o left ear fullness and hearing loss, wants ENT referral ROS Denies recent fever or chills. Denies chest congestion, productive cough or wheezing. Denies chest pains, palpitations and leg swelling Denies abdominal pain, nausea, vomiting,diarrhea or constipation.   Denies dysuria, frequency, hesitancy or incontinence. Denies joint pain, swelling and limitation in mobility. Denies headaches, seizures, numbness, or tingling.  PE  BP (!) 180/104    Pulse 93    Ht 5\' 3"  (1.6 m)    Wt 157 lb 1.9 oz (71.3 kg)    SpO2 97%    BMI 27.83 kg/m   Patient alert and oriented and in no cardiopulmonary distress.  HEENT: No facial asymmetry, EOMI,     Neck supple .TM clear bilaterally  Chest: Clear to auscultation bilaterally.  CVS: S1, S2 no murmurs, no S3.Regular rate.  ABD: Soft non tender.   Ext: No edema  MS: Adequate ROM spine, shoulders, hips and knees.  Skin: Intact, no ulcerations or rash noted.Patchy allopecia  Psych: Good eye contact, normal affect. Memory intact not anxious and  depressed appearing.  CNS: CN 2-12 intact, power,  normal throughout.no focal deficits noted.   Assessment & Plan  Malignant hypertension Uncontrolled, increase dose of ydralazinewhich she is currently tolerating DASH diet and commitment to daily physical activity for a minimum of 30 minutes discussed and encouraged, as a part of hypertension management. The importance of attaining a healthy weight is  also discussed.  BP/Weight 08/11/2021 06/10/2021 04/15/2021 02/18/2021 02/03/2021 01/24/2021 01/13/5283  Systolic BP 132 440 102 725 366 440 347  Diastolic BP 425 94 89 93 86 94 105  Wt. (Lbs) 157.12 158.12 152 151.75 152.8 152 156.4  BMI 27.83 28.01 26.93 26.88 27.07 26.93 27.71  Some encounter information is confidential and restricted. Go to Review Flowsheets activity to see all data.       GAD (generalized anxiety disorder) Score of 14 in 07/2021, refer to Therapy  MDD (major depressive disorder), recurrent episode, moderate (HCC) Score of 9 in 07/2021, refer to therapy  Alopecia persitent concern, patchy allopecia refer for 2nd opinion per request  Hearing loss C/o left hearing loss and fullness in left ear, refer to ENT for E/M

## 2021-08-15 NOTE — Assessment & Plan Note (Signed)
persitent concern, patchy allopecia refer for 2nd opinion per request

## 2021-08-15 NOTE — Assessment & Plan Note (Signed)
Uncontrolled, increase dose of ydralazinewhich she is currently tolerating DASH diet and commitment to daily physical activity for a minimum of 30 minutes discussed and encouraged, as a part of hypertension management. The importance of attaining a healthy weight is also discussed.  BP/Weight 08/11/2021 06/10/2021 04/15/2021 02/18/2021 02/03/2021 01/24/2021 1/61/0960  Systolic BP 454 098 119 147 829 562 130  Diastolic BP 865 94 89 93 86 94 105  Wt. (Lbs) 157.12 158.12 152 151.75 152.8 152 156.4  BMI 27.83 28.01 26.93 26.88 27.07 26.93 27.71  Some encounter information is confidential and restricted. Go to Review Flowsheets activity to see all data.

## 2021-08-15 NOTE — Assessment & Plan Note (Signed)
Score of 9 in 07/2021, refer to therapy

## 2021-08-15 NOTE — Assessment & Plan Note (Signed)
Score of 14 in 07/2021, refer to Therapy

## 2021-08-16 ENCOUNTER — Telehealth: Payer: Self-pay | Admitting: *Deleted

## 2021-08-16 NOTE — Chronic Care Management (AMB) (Signed)
Chronic Care Management   Note  08/16/2021 Name: MARKELL SCHRIER MRN: [id number] DOB: [birth year]  Marcille Blanco is a 66 y.o. year old female who is a primary care patient of Moshe Cipro Norwood Levo, MD. I reached out to Marcille Blanco by phone today in response to a referral sent by Ms. Jalecia R Swift's PCP.  Ms. Ciolek was given information about Chronic Care Management services today including:  CCM service includes personalized support from designated clinical staff supervised by her physician, including individualized plan of care and coordination with other care providers 24/7 contact phone numbers for assistance for urgent and routine care needs. Service will only be billed when office clinical staff spend 20 minutes or more in a month to coordinate care. Only one practitioner may furnish and bill the service in a calendar month. The patient may stop CCM services at any time (effective at the end of the month) by phone call to the office staff. The patient is responsible for co-pay (up to 20% after annual deductible is met) if co-pay is required by the individual health plan.   Patient agreed to services and verbal consent obtained.   Follow up plan: Face to Face appointment with care management team member scheduled for: 09/23/21  [place] [facility]  Direct Dial: [phone]

## 2021-09-16 ENCOUNTER — Ambulatory Visit (INDEPENDENT_AMBULATORY_CARE_PROVIDER_SITE_OTHER): Payer: Medicare Other | Admitting: Family Medicine

## 2021-09-16 ENCOUNTER — Encounter: Payer: Self-pay | Admitting: Family Medicine

## 2021-09-16 ENCOUNTER — Other Ambulatory Visit: Payer: Self-pay

## 2021-09-16 VITALS — BP 184/104 | HR 112 | Resp 16 | Ht 63.0 in | Wt 158.1 lb

## 2021-09-16 DIAGNOSIS — I10 Essential (primary) hypertension: Secondary | ICD-10-CM

## 2021-09-16 DIAGNOSIS — Z889 Allergy status to unspecified drugs, medicaments and biological substances status: Secondary | ICD-10-CM

## 2021-09-16 DIAGNOSIS — M2550 Pain in unspecified joint: Secondary | ICD-10-CM

## 2021-09-16 DIAGNOSIS — F411 Generalized anxiety disorder: Secondary | ICD-10-CM

## 2021-09-16 DIAGNOSIS — E785 Hyperlipidemia, unspecified: Secondary | ICD-10-CM

## 2021-09-16 MED ORDER — HYDRALAZINE HCL 25 MG PO TABS: ORAL_TABLET | ORAL | 5 refills | Status: DC

## 2021-09-16 MED ORDER — AMLODIPINE BESYLATE 5 MG PO TABS: 5.0000 mg | ORAL_TABLET | Freq: Every day | ORAL | 3 refills | Status: DC

## 2021-09-16 NOTE — Patient Instructions (Signed)
Follow-up in 3 months call if you need me sooner. ? ?You are referred to advanced hypertension clinic because of severe hypertension. ? ?Very important that you keep this appointment. ? ?You are being referred to physical therapy for management of generalized joint and muscle pain ? ?New additional medication for blood pressure is amlodipine 5 mg take one at bedtime whe you take your 2nd hydralazine tablet ? ?Thanks for choosing [facility], we consider it a privelige to serve you. ? ?

## 2021-09-20 ENCOUNTER — Telehealth (HOSPITAL_COMMUNITY): Payer: Medicare Other | Admitting: Psychiatry

## 2021-09-23 ENCOUNTER — Other Ambulatory Visit: Payer: Self-pay

## 2021-09-23 ENCOUNTER — Ambulatory Visit (INDEPENDENT_AMBULATORY_CARE_PROVIDER_SITE_OTHER): Payer: Medicare Other | Admitting: Licensed Clinical Social Worker

## 2021-09-23 DIAGNOSIS — M797 Fibromyalgia: Secondary | ICD-10-CM

## 2021-09-23 DIAGNOSIS — F331 Major depressive disorder, recurrent, moderate: Secondary | ICD-10-CM

## 2021-09-23 DIAGNOSIS — M7989 Other specified soft tissue disorders: Secondary | ICD-10-CM

## 2021-09-23 DIAGNOSIS — I10 Essential (primary) hypertension: Secondary | ICD-10-CM

## 2021-09-23 DIAGNOSIS — F411 Generalized anxiety disorder: Secondary | ICD-10-CM

## 2021-09-23 DIAGNOSIS — L659 Nonscarring hair loss, unspecified: Secondary | ICD-10-CM

## 2021-09-23 NOTE — Patient Instructions (Addendum)
Visit Information ? ?Patient Goals:  Anxiety Symptoms Monitored  and Managed. Manage Depression issues ? ?Time Frame:  Short Term Goal ?Priority:  High ?Progress:  Not On Track ? ?Start Date:  09/23/21 ?End Date: 12/21/21 ? ?Follow Up Date:  10/28/21 at 1:30 PM ? ?Anxiety Symptoms Monitored and Managed. Manage Depression issues ? ?Patient Coping Strengths:  ?Attends medical appointments ?Takes medications as prescribed ?Has support from her daughter ? ?Patient Self Care Deficits:  ?Financial challenges ?Pain issues ?Hair loss issues ?Depression issues ? ?Patient Goals:  ?- spend time or talk with others at least 2 to 3 times per week ?- practice relaxation or meditation daily ?- keep a calendar with appointment dates ? ?Follow Up Plan: LCSW to call client on 10/28/21 at 1:30 PM to discuss client needs  ? ?Norva Riffle.Udell Mazzocco MSW, LCSW ?Licensed Clinical Social Worker ?[facility] ?[phone] ?

## 2021-09-23 NOTE — Chronic Care Management (AMB) (Signed)
?Chronic Care Management  ? ? Clinical Social Work Note ? ?09/23/2021 ?Name: Shirley Bush MRN: [id number] DOB: 1955-09-02 ? ?Shirley Bush is a 66 y.o. year old female who is a primary care patient of Moshe Cipro Norwood Levo, MD. The CCM team was consulted to assist the patient with chronic disease management and/or care coordination needs related to: [employer] .  ? ?Engaged with patient face to face at PCP office for follow up visit in response to provider referral for social work chronic care management and care coordination services.  ? ?Consent to Services:  ?The patient was given information about Chronic Care Management services, agreed to services, and gave verbal consent prior to initiation of services.  Please see initial visit note for detailed documentation.  ? ?Patient agreed to services and consent obtained.  ? ?Assessment: Review of patient past medical history, allergies, medications, and health status, including review of relevant consultants reports was performed today as part of a comprehensive evaluation and provision of chronic care management and care coordination services.    ? ?SDOH (Social Determinants of Health) assessments and interventions performed:  ?SDOH Interventions   ? ?Flowsheet Row Most Recent Value  ?SDOH Interventions   ?Financial Strain Interventions --  [discussed financial needs of client. She has a number of family members living at her residence.]  ?Stress Interventions Provide Counseling  [client has some stress over family situation. Is concerned over caring for grandchildren]  ?Depression Interventions/Treatment  Medication, Counseling  ? ?  ?  ? ?Advanced Directives Status: See Vyn[employer] application for related entries. ? ?CCM Care Plan ? ?Allergies  ?Allergen Reactions  ? Lisinopril Cough  ? Maxzide [Hydrochlorothiazide W-Triamterene] Itching  ?  Rash  ? Other Rash  ? Buspar [Buspirone]   ?  Rash  ?  ? Beano Meltaways [Alpha-D-Galactosidase] Rash  ? Crestor  [Rosuvastatin Calcium] Rash  ? Paxil [Paroxetine] Other (See Comments)  ?  Neck pain  ? Simethicone Rash  ? ? ?Outpatient Encounter Medications as of 09/23/2021  ?Medication Sig  ? amLODipine (NORVASC) 5 MG tablet Take 1 tablet (5 mg total) by mouth daily.  ? fluticasone (FLONASE) 50 MCG/ACT nasal spray Place 2 sprays into both nostrils daily.  ? hydrALAZINE (APRESOLINE) 25 MG tablet Take one tablet by mouth two times daily at 9am and 9 pm  ? Misc Natural Products (AIRBORNE ELDERBERRY) CHEW Chew 1 each by mouth daily.  ? Multiple Vitamins-Minerals (HAIR SKIN AND NAILS FORMULA) TABS Take 3 tablets by mouth daily.  ? PARoxetine (PAXIL) 20 MG tablet Take 1 tablet (20 mg total) by mouth daily.  ? Specialty Vitamins Products (COLLAGEN ULTRA) CAPS Take 1 each by mouth daily.  ? Zinc Sulfate (ZINC 15 PO) Take by mouth. Daily  ? ?No facility-administered encounter medications on file as of 09/23/2021.  ? ? ?Patient Active Problem List  ? Diagnosis Date Noted  ? Otalgia, left 08/15/2021  ? Hearing loss 08/15/2021  ? Leg swelling 06/12/2021  ? GAD (generalized anxiety disorder) 04/15/2021  ? High vitamin D level 04/15/2021  ? Flatulence 09/23/2020  ? Rash and nonspecific skin eruption 07/02/2020  ? Tinea capitis 07/18/2019  ? Alopecia 07/18/2019  ? Ridged nails 07/18/2019  ? Situational anxiety 05/18/2019  ? Constipation 04/25/2018  ? Change in bowel movement 01/23/2018  ? FH: colon cancer in relative <4 years old 08/09/2017  ? MDD (major depressive disorder), recurrent episode, moderate ([place]) 07/04/2017  ? DJD (degenerative joint disease) of knee 11/11/2014  ? Allergic rhinitis  07/19/2014  ? Plantar fasciitis 05/08/2013  ? Bilateral hand pain 04/18/2013  ? Heel pain 04/18/2013  ? Inflammatory polyarthropathy ([place]) 02/28/2013  ? Back pain with radiation 02/07/2013  ? Fibromyalgia 04/05/2012  ? Hyperlipidemia 02/11/2011  ? Chest pain in adult 05/04/2008  ? FIBROIDS, UTERUS 03/31/2008  ? NECK AND BACK PAIN 02/14/2008  ?  Malignant hypertension 10/18/2007  ? ? ?Conditions to be addressed/monitored: monitor client management of depression issues ? ?Care Plan : LCSW care plan  ?Updates made by Katha Cabal, LCSW since 09/23/2021 12:00 AM  ? ?Goal: Anxiety Symptoms Monitored and Managed. Manage depression issues   ?Start Date: 09/23/2021  ?Expected End Date: 12/22/2021  ?This Visit's Progress: Not on track  ?Priority: High  ?Note:   ?Current Barriers:  ?Financial challenges ?Sleeping challenges ?Depression issues ?Family stress issues ?Suicidal Ideation/Homicidal Ideation: No ? ?Clinical Social Work Goal(s):  ?patient will work with SW monthly by telephone or in person to reduce or manage symptoms related to depression ?Patient will talk with SW monthly about self care for client (getting adequgte rest, eating meals routinely, participating in relaxation activities) ?Patient will communicate in next 30 days with [facility]  for nursing support ? ?Interventions: ?Patient interviewed and appropriate assessments performed: PHQ 2/9; GAD 7  ?1:1 collaboration with Fayrene Helper, MD regarding development and update of comprehensive plan of care as evidenced by provider attestation and co-signature ?Discussed client needs with Marcille Blanco today ?Reviewed financial challenges of client  ?Discussed housing situation of client  She has a number of family members living at her home ?Provided counseling support for client ?Discussed referral to Hypertensive Clinic. Client referred to Dr. Skeet Latch at [place],  [place], phone number [phone] ?Client discussed hair loss, beginning a few years ago.  She talked of itching on her scalp, talked of hair loss. Client has tried several prescriptions offered  LCSW talked with Lavine about self esteem issues and going out in public. She said she had talked with dermatologist about this issue a few years ago ?Provided counseling support for client ?Encouraged  client to build on her strengths:  attend medical appointments, take medications as scheduled, talk with daughter via phone, likes being outdoors) ?Encouraged client to call RNCM as needed for nursing support ? ?Patient Coping Strengths:  ?Attends medical appointments ?Takes medications as prescribed ?Has support from her daughter ? ?Patient Self Care Deficits:  ?Financial challenges ?Pain issues ?Hair loss issues ?Depression issues ? ?Patient Goals:  ?- spend time or talk with others at least 2 to 3 times per week ?- practice relaxation or meditation daily ?- keep a calendar with appointment dates ? ?Follow Up Plan: LCSW to call client on 10/28/21 at 1:30 PM to discuss client needs   ? ?Norva Riffle.Tucker Steedley MSW, LCSW ?Licensed Clinical Social Worker ?[facility] ?[phone] ?

## 2021-09-26 DIAGNOSIS — M2550 Pain in unspecified joint: Secondary | ICD-10-CM | POA: Insufficient documentation

## 2021-09-26 DIAGNOSIS — Z889 Allergy status to unspecified drugs, medicaments and biological substances status: Secondary | ICD-10-CM | POA: Insufficient documentation

## 2021-09-26 NOTE — Assessment & Plan Note (Signed)
Reports multiple allergic reactions and intolerance to drugs , treating her HTN has been a challenge for over 10 years ?Refer to advanced HTN clinic, she agrees ?DASH diet and commitment to daily physical activity for a minimum of 30 minutes discussed and encouraged, as a part of hypertension management. ?The importance of attaining a healthy weight is also discussed. ? ?BP/Weight 09/16/2021 08/11/2021 06/10/2021 04/15/2021 02/18/2021 02/03/2021 01/24/2021  ?Systolic BP 060 045 997 741 161 164 156  ?Diastolic BP 423 953 94 89 93 86 94  ?Wt. (Lbs) 158.12 157.12 158.12 152 151.75 152.8 152  ?BMI 28.01 27.83 28.01 26.93 26.88 27.07 26.93  ?Some encounter information is confidential and restricted. Go to Review Flowsheets activity to see all data.  ? ? ? ? ?

## 2021-09-26 NOTE — Assessment & Plan Note (Signed)
Continue paxil , improved on this ?

## 2021-09-26 NOTE — Assessment & Plan Note (Addendum)
DASH diet and commitment to daily physical activity for a minimum of 30 minutes discussed and encouraged, as a part of hypertension management. ?The importance of attaining a healthy weight is also discussed. ?Intolerant of multiple treatment options attempted, refer to advanced HTN clinic, she agrees ?Add bed time amlodipine ? ?BP/Weight 09/16/2021 08/11/2021 06/10/2021 04/15/2021 02/18/2021 02/03/2021 01/24/2021  ?Systolic BP 532 992 426 834 161 164 156  ?Diastolic BP 196 222 94 89 93 86 94  ?Wt. (Lbs) 158.12 157.12 158.12 152 151.75 152.8 152  ?BMI 28.01 27.83 28.01 26.93 26.88 27.07 26.93  ?Some encounter information is confidential and restricted. Go to Review Flowsheets activity to see all data.  ? ? ? ? ?

## 2021-09-26 NOTE — Assessment & Plan Note (Signed)
C/o increased generalized joint and muscle pain, refer to PT, has dx of fibromyalgia ?

## 2021-09-26 NOTE — Progress Notes (Signed)
? ?Shirley Bush     MRN: [id number]      DOB: [birth year] ? ? ?HPI ?Ms. Agredano is here for follow up of uncontrolled hypertension. Again states unable to tolerate prescribed medication and her blood pressure remains markedly elevated and uncontrolled ?Denies chest pain, PNd or orthopnea ?C/o generalized joint and muscle pain, medication not beneficial, has dx of fibromyalgia ? ?ROS ?Denies recent fever or chills. ?Denies sinus pressure, nasal congestion, ear pain or sore throat. ?Denies chest congestion, productive cough or wheezing. ?Denies chest pains, palpitations and leg swelling ?Denies abdominal pain, nausea, vomiting,diarrhea or constipation.   ?Denies dysuria, frequency, hesitancy or incontinence.y. ?Denies headaches, seizures, numbness, or tingling. ?Chronic  depression and  anxiety . ?Denies skin break down or rash.c/o hair loss ? ? ?PE ? ?BP (!) 184/104   Pulse (!) 112   Resp 16   Ht '5\' 3"'$  (1.6 m)   Wt 158 lb 1.9 oz (71.7 kg)   SpO2 97%   BMI 28.01 kg/m?  ? ?Patient alert and oriented and in no cardiopulmonary distress. ? ?HEENT: No facial asymmetry, EOMI,     Neck supple . ? ?Chest: Clear to auscultation bilaterally. ? ?CVS: S1, S2 no murmurs, no S3.Regular rate. ? ?ABD: Soft non tender.  ? ?Ext: No edema ? ?MS: Adequate ROM spine, shoulders, hips and knees. ? ?Skin: Intact, no ulcerations or rash noted. ? ?Psych: Good eye contact, normal affect. Memory intact not anxious or depressed appearing. ? ?CNS: CN 2-12 intact, power,  normal throughout.no focal deficits noted. ? ? ?Assessment & Plan ? ?Malignant hypertension ?DASH diet and commitment to daily physical activity for a minimum of 30 minutes discussed and encouraged, as a part of hypertension management. ?The importance of attaining a healthy weight is also discussed. ?Intolerant of multiple treatment options attempted, refer to advanced HTN clinic, she agrees ?Add bed time amlodipine ? ?BP/Weight 09/16/2021 08/11/2021 06/10/2021 04/15/2021  02/18/2021 02/03/2021 01/24/2021  ?Systolic BP 270 623 762 831 161 164 156  ?Diastolic BP 517 616 94 89 93 86 94  ?Wt. (Lbs) 158.12 157.12 158.12 152 151.75 152.8 152  ?BMI 28.01 27.83 28.01 26.93 26.88 27.07 26.93  ?Some encounter information is confidential and restricted. Go to Review Flowsheets activity to see all data.  ? ? ? ? ? ?Multiple drug allergies ?Reports multiple allergic reactions and intolerance to drugs , treating her HTN has been a challenge for over 10 years ?Refer to advanced HTN clinic, she agrees ?DASH diet and commitment to daily physical activity for a minimum of 30 minutes discussed and encouraged, as a part of hypertension management. ?The importance of attaining a healthy weight is also discussed. ? ?BP/Weight 09/16/2021 08/11/2021 06/10/2021 04/15/2021 02/18/2021 02/03/2021 01/24/2021  ?Systolic BP 073 710 626 948 161 164 156  ?Diastolic BP 546 270 94 89 93 86 94  ?Wt. (Lbs) 158.12 157.12 158.12 152 151.75 152.8 152  ?BMI 28.01 27.83 28.01 26.93 26.88 27.07 26.93  ?Some encounter information is confidential and restricted. Go to Review Flowsheets activity to see all data.  ? ? ? ? ? ?Generalized joint pain ?C/o increased generalized joint and muscle pain, refer to PT, has dx of fibromyalgia ? ?GAD (generalized anxiety disorder) ?Continue paxil , improved on this ? ?Hyperlipidemia ?Hyperlipidemia:Low fat diet discussed and encouraged. ? ? ?Lipid Panel  ?Lab Results  ?Component Value Date  ? CHOL 221 (H) 01/24/2021  ? HDL 58 01/24/2021  ? LDLCALC 142 (H) 01/24/2021  ? TRIG 119 01/24/2021  ?  CHOLHDL 3.8 01/24/2021  ? ? ? ?Needs to reduce fat intake ? ?

## 2021-09-26 NOTE — Assessment & Plan Note (Signed)
Hyperlipidemia:Low fat diet discussed and encouraged. ? ? ?Lipid Panel  ?Lab Results  ?Component Value Date  ? CHOL 221 (H) 01/24/2021  ? HDL 58 01/24/2021  ? LDLCALC 142 (H) 01/24/2021  ? TRIG 119 01/24/2021  ? CHOLHDL 3.8 01/24/2021  ? ? ? ?Needs to reduce fat intake ?

## 2021-09-28 ENCOUNTER — Telehealth: Payer: Self-pay | Admitting: Family Medicine

## 2021-09-28 NOTE — Telephone Encounter (Signed)
The referral was just entered a few days ago. She is wanting to go somewhere else bc she hasn't heard anything. Please advise what to do ?

## 2021-09-28 NOTE — Telephone Encounter (Signed)
Patient called in regard to recent referral to [facility] [place]. ? ?Patient is unable to get in contact with office.  ? ?Patient has been calling office with no return call.  ? ?Patient would like new referral, possibly in [place], [place]  ? ?Patient wants a call back in regard. ?

## 2021-10-06 ENCOUNTER — Ambulatory Visit (HOSPITAL_COMMUNITY): Payer: Medicare Other | Admitting: Psychiatry

## 2021-10-14 ENCOUNTER — Ambulatory Visit (HOSPITAL_COMMUNITY): Payer: Medicare Other | Attending: Family Medicine | Admitting: Physical Therapy

## 2021-10-14 DIAGNOSIS — M5459 Other low back pain: Secondary | ICD-10-CM | POA: Diagnosis not present

## 2021-10-14 DIAGNOSIS — F331 Major depressive disorder, recurrent, moderate: Secondary | ICD-10-CM

## 2021-10-14 DIAGNOSIS — M2550 Pain in unspecified joint: Secondary | ICD-10-CM | POA: Diagnosis not present

## 2021-10-14 NOTE — Therapy (Signed)
?OUTPATIENT PHYSICAL THERAPY THORACOLUMBAR EVALUATION ? ? ?Patient Name: Shirley Bush ?MRN: [id number] ?DOB:Nov 30, 1955, 66 y.o., female ?Today's Date: 10/14/2021 ? ? PT End of Session - 10/14/21 1311   ? ? Visit Number 1   ? Number of Visits 8   ? Date for PT Re-Evaluation 11/13/21   ? Authorization Type [employer] Medicare   ? Progress Note Due on Visit 8   ? PT Start Time 1315   ? PT Stop Time 1400   ? PT Time Calculation (min) 45 min   ? ?  ?  ? ?  ? ? ?Past Medical History:  ?Diagnosis Date  ? Allergy Don't remember  ? Don't remember  ? Anxiety   ? Arthritis In 2007  ? In both of my shoulders and my knees  ? Depression 2002  ? Depression   ? Phreesia 06/28/2020  ? Depression   ? Phreesia 07/23/2020  ? Fibromyalgia   ? Heart murmur Don't remember  ? I was in 55s  ? Hypertension 2004  ? Medial meniscus tear 04/2013  ? left  ? Migraines   ? ?Past Surgical History:  ?Procedure Laterality Date  ? [place]  ? [place]  ? [place]  ? CESAREAN SECTION N/A   ? Phreesia 06/28/2020  ? COLONOSCOPY  02/05/2009  ? COLONOSCOPY N/A 03/06/2014  ? Procedure: COLONOSCOPY;  Surgeon: Danie Binder, MD;  [facility];  Service: Endoscopy;  Laterality: N/A;  10:30 AM  ? COLONOSCOPY WITH PROPOFOL N/A 01/14/2019  ? Surgeon: Danie Binder, MD;  one 3 mm tubular adenoma in the mid ascending colon, moderate diverticulosis in entire colon, external and internal hemorrhoids, tortuous left colon. Recommended repeat in 5 years.   ? FINGER SURGERY Right   ? long finger  ? HERNIA REPAIR N/A   ? Phreesia 06/28/2020  ? JOINT REPLACEMENT N/A   ? Phreesia 06/28/2020  ? KNEE ARTHROSCOPY WITH MEDIAL MENISECTOMY Left 04/24/2013  ? Procedure: LEFT KNEE ARTHROSCOPY CHONDROPLASTY WITH MEDIAL MENISECTOMY;  Surgeon: Ninetta Lights, MD;  Location: [place];  Service: Orthopedics;  Laterality: Left;  ? SHOULDER ARTHROSCOPY Left 2009  ? SHOULDER ARTHROSCOPY Right   ? SHOULDER ARTHROSCOPY WITH  ROTATOR CUFF REPAIR Left 11/25/2004  ? TOTAL KNEE ARTHROPLASTY Left 11/11/2014  ? Procedure: LEFT TOTAL KNEE ARTHROPLASTY;  Surgeon: Kathryne Hitch, MD;  Location: [place];  Service: Orthopedics;  Laterality: Left;  ? TUBAL LIGATION  1992  ? UMBILICAL HERNIA REPAIR  2008  ? ?Patient Active Problem List  ? Diagnosis Date Noted  ? Multiple drug allergies 09/26/2021  ? Generalized joint pain 09/26/2021  ? Otalgia, left 08/15/2021  ? Hearing loss 08/15/2021  ? Leg swelling 06/12/2021  ? GAD (generalized anxiety disorder) 04/15/2021  ? High vitamin D level 04/15/2021  ? Flatulence 09/23/2020  ? Rash and nonspecific skin eruption 07/02/2020  ? Tinea capitis 07/18/2019  ? Alopecia 07/18/2019  ? Ridged nails 07/18/2019  ? Situational anxiety 05/18/2019  ? Constipation 04/25/2018  ? Change in bowel movement 01/23/2018  ? FH: colon cancer in relative <57 years old 08/09/2017  ? MDD (major depressive disorder), recurrent episode, moderate ([place]) 07/04/2017  ? DJD (degenerative joint disease) of knee 11/11/2014  ? Allergic rhinitis 07/19/2014  ? Plantar fasciitis 05/08/2013  ? Bilateral hand pain 04/18/2013  ? Heel pain 04/18/2013  ? Inflammatory polyarthropathy ([place]) 02/28/2013  ? Back pain with radiation 02/07/2013  ? Fibromyalgia 04/05/2012  ?  Hyperlipidemia 02/11/2011  ? Chest pain in adult 05/04/2008  ? FIBROIDS, UTERUS 03/31/2008  ? NECK AND BACK PAIN 02/14/2008  ? Malignant hypertension 10/18/2007  ? ? ?PCP: Fayrene Helper, MD ? ?REFERRING PROVIDER: Fayrene Helper, MD ? ?REFERRING DIAG: Joint pain ? ?THERAPY DIAG:  ?Other low back pain ? ?ONSET DATE: 08/17/2021 ? ?SUBJECTIVE:                                                                                                                                                                                          ? ?SUBJECTIVE STATEMENT: ?PT states that she tries to exercise but it flares up her hips and knees.  She states that she has fibromyalgia.  She was dx in 2009 She  has increased pain while performing her  ?Housework.  ?PERTINENT HISTORY:  ?Cervical pain, LT TKR , B shoulder pain, fibromyalgia  ? ?PAIN:  ?Are you having pain? Yes: NPRS scale: 5/10, will go as high as an 8/10 ?Pain location: low back  ?Pain description: aching  ?Aggravating factors: sitting  ?Relieving factors: tylenol ? ? ?PRECAUTIONS: None ? ?WEIGHT BEARING RESTRICTIONS No ? ?FALLS:  ?Has patient fallen in last 6 months? No ? ?LIVING ENVIRONMENT: ?Lives with: lives with their family ?Lives in: House/apartment ?Stairs: No ?Has following equipment at home: None ? ?OCCUPATION: disability  ? ?PLOF: Independent with basic ADLs ? ?PATIENT GOALS less pain  ? ? ?OBJECTIVE:  ? ?DIAGNOSTIC FINDINGS:  ?None  ? ?COGNITION: ? Overall cognitive status: Within functional limits for tasks assessed   ?  ? ?MUSCLE LENGTH: ?Hamstrings: Right 170 deg; Left 170 deg ?POSTURE:  ?No issues seen ? ? ? ?LUMBAR ROM:  ? ?Active  A/PROM  ?10/14/2021  ?Flexion WNLreps no change   ?Extension WNL; reps no change    ?Right lateral flexion WNL  ?Left lateral flexion WNL  ?Right rotation WNL  ?Left rotation WNL  ? (Blank rows = not tested) ? ?LE MMT: ? ?MMT Right ?10/14/2021 Left ?10/14/2021  ?Hip flexion 5/5 5/5  ?Hip extension 4/5 4/5  ?Hip abduction 5/5 5/5  ?Hip adduction    ?Hip internal rotation    ?Hip external rotation    ?Knee flexion 5/5 5/5  ?Knee extension 5/5 5/5  ?Ankle dorsiflexion 5/5 5/5  ? ? ? ?FUNCTIONAL TESTS:  ?30 seconds chair stand test- 12x ;  Single leg stance:  RT: 4"; LT: 60"  ? ?GAIT: ?Distance walked: 452 ?Assistive device utilized: None ?Level of assistance: Complete Independence ? ? ? ? ?TODAY'S TREATMENT  ?Knee to chest, hamstring stretch:  B 2 x20" ?Abdominal bracing, bridge x 10  ? ? ?  PATIENT EDUCATION:  ?Education details: HEP ?Person educated: Patient ?Education method: Explanation ?Education comprehension: verbalized understanding ? ? ?HOME EXERCISE PROGRAM: ?HEP  ? ?ASSESSMENT: ? ?CLINICAL IMPRESSION: ?Patient  is a 66 y.o. Female who was seen today for physical therapy evaluation and treatment for low back pain.  Evaluation demonstrates decreased core strength and decreased balance.  Shirley Bush will benefit from skilled PT to decrease her pain to allow her to complete her normal activities without increased Pain.   ? ? ?OBJECTIVE IMPAIRMENTS decreased balance, decreased activity tolerance, impaired perceived functional ability, and pain.  ? ?ACTIVITY LIMITATIONS cleaning.  ? ?PERSONAL FACTORS Fitness, Past/current experiences, Time since onset of injury/illness/exacerbation, and 1 comorbidity: fibromyalgia  are also affecting patient's functional outcome.  ? ? ?REHAB POTENTIAL: Fair   ? ?CLINICAL DECISION MAKING: Stable/uncomplicated ? ?EVALUATION COMPLEXITY: Low ? ? ?GOALS: ?Goals reviewed with patient? No ? ?SHORT TERM GOALS: Target date: 10/28/2021 ? ?Pt to be I in HEP to allow her back pain to be no greater than a 5/10 ?Baseline: ?Goal status: INITIAL ? ?2.  Pt to be able to sit for 30 minutes without increased pain ?Baseline:  ?Goal status: INITIAL ? ? ?LONG TERM GOALS: Target date: 11/11/2021 ? ?PT to be I in advanced HEP to allow her back pain to be no greater than a 3/10 in the past week  ?Baseline:  ?Goal status: INITIAL ? ?2.  PT to be able to complete her housework without having to take a break  ?Baseline:  ?Goal status: INITIAL ?3.  Pt to be able to single leg stance for 20 seconds on her Rt LE to feel comfortable walking on uneven ground  ?Goal Status: INITIAL ? ?PLAN: ?PT FREQUENCY: 2x/week ? ?PT DURATION: 4 weeks ? ?PLANNED INTERVENTIONS: Therapeutic exercises, Therapeutic activity, Neuromuscular re-education, Balance training, Gait training, Patient/Family education, and Joint mobilization. ? ?PLAN FOR NEXT SESSION: Continue with stretching and stabilization exercises.   ? ?Rayetta Humphrey, PT CLT ?[phone]  ?10/14/2021, 1:16 PM  ?

## 2021-10-17 ENCOUNTER — Encounter (HOSPITAL_COMMUNITY): Payer: Medicare Other | Admitting: Physical Therapy

## 2021-10-20 ENCOUNTER — Encounter (HOSPITAL_COMMUNITY): Payer: Self-pay

## 2021-10-20 ENCOUNTER — Ambulatory Visit (HOSPITAL_COMMUNITY): Payer: Medicare Other | Attending: Family Medicine

## 2021-10-20 DIAGNOSIS — M545 Low back pain, unspecified: Secondary | ICD-10-CM | POA: Insufficient documentation

## 2021-10-20 DIAGNOSIS — M5459 Other low back pain: Secondary | ICD-10-CM | POA: Diagnosis not present

## 2021-10-20 NOTE — Therapy (Addendum)
?OUTPATIENT PHYSICAL THERAPY THORACOLUMBAR TREATMENT ? ? ?Patient Name: SIONA COULSTON ?MRN: [id number] ?DOB:September 25, 1955, 66 y.o., female ?Today's Date: 10/20/2021 ? ?Visit number: 2/8 ?Date of re-eval: 11/13/21 ?Authorization: [facility] Medicare ?Progress note due visit number: 8 ?Time: 6144-3154 ?Time: 44 minutes ?Activity tolerance:  patient tolerated treatment well ?Behavior:  WNF for tasks assessed/performed ? ? ?Past Medical History:  ?Diagnosis Date  ? Allergy Don't remember  ? Don't remember  ? Anxiety   ? Arthritis In 2007  ? In both of my shoulders and my knees  ? Depression 2002  ? Depression   ? Phreesia 06/28/2020  ? Depression   ? Phreesia 07/23/2020  ? Fibromyalgia   ? Heart murmur Don't remember  ? I was in 29s  ? Hypertension 2004  ? Medial meniscus tear 04/2013  ? left  ? Migraines   ? ?Past Surgical History:  ?Procedure Laterality Date  ? [place]  ? [place]  ? [place]  ? CESAREAN SECTION N/A   ? Phreesia 06/28/2020  ? COLONOSCOPY  02/05/2009  ? COLONOSCOPY N/A 03/06/2014  ? Procedure: COLONOSCOPY;  Surgeon: Danie Binder, MD;  [facility];  Service: Endoscopy;  Laterality: N/A;  10:30 AM  ? COLONOSCOPY WITH PROPOFOL N/A 01/14/2019  ? Surgeon: Danie Binder, MD;  one 3 mm tubular adenoma in the mid ascending colon, moderate diverticulosis in entire colon, external and internal hemorrhoids, tortuous left colon. Recommended repeat in 5 years.   ? FINGER SURGERY Right   ? long finger  ? HERNIA REPAIR N/A   ? Phreesia 06/28/2020  ? JOINT REPLACEMENT N/A   ? Phreesia 06/28/2020  ? KNEE ARTHROSCOPY WITH MEDIAL MENISECTOMY Left 04/24/2013  ? Procedure: LEFT KNEE ARTHROSCOPY CHONDROPLASTY WITH MEDIAL MENISECTOMY;  Surgeon: Ninetta Lights, MD;  Location: [place];  Service: Orthopedics;  Laterality: Left;  ? SHOULDER ARTHROSCOPY Left 2009  ? SHOULDER ARTHROSCOPY Right   ? SHOULDER ARTHROSCOPY WITH ROTATOR CUFF REPAIR Left 11/25/2004  ?  TOTAL KNEE ARTHROPLASTY Left 11/11/2014  ? Procedure: LEFT TOTAL KNEE ARTHROPLASTY;  Surgeon: Kathryne Hitch, MD;  Location: [place];  Service: Orthopedics;  Laterality: Left;  ? TUBAL LIGATION  1992  ? UMBILICAL HERNIA REPAIR  2008  ? ?Patient Active Problem List  ? Diagnosis Date Noted  ? Multiple drug allergies 09/26/2021  ? Generalized joint pain 09/26/2021  ? Otalgia, left 08/15/2021  ? Hearing loss 08/15/2021  ? Leg swelling 06/12/2021  ? GAD (generalized anxiety disorder) 04/15/2021  ? High vitamin D level 04/15/2021  ? Flatulence 09/23/2020  ? Rash and nonspecific skin eruption 07/02/2020  ? Tinea capitis 07/18/2019  ? Alopecia 07/18/2019  ? Ridged nails 07/18/2019  ? Situational anxiety 05/18/2019  ? Constipation 04/25/2018  ? Change in bowel movement 01/23/2018  ? FH: colon cancer in relative <4 years old 08/09/2017  ? MDD (major depressive disorder), recurrent episode, moderate ([place]) 07/04/2017  ? DJD (degenerative joint disease) of knee 11/11/2014  ? Allergic rhinitis 07/19/2014  ? Plantar fasciitis 05/08/2013  ? Bilateral hand pain 04/18/2013  ? Heel pain 04/18/2013  ? Inflammatory polyarthropathy ([place]) 02/28/2013  ? Back pain with radiation 02/07/2013  ? Fibromyalgia 04/05/2012  ? Hyperlipidemia 02/11/2011  ? Chest pain in adult 05/04/2008  ? FIBROIDS, UTERUS 03/31/2008  ? NECK AND BACK PAIN 02/14/2008  ? Malignant hypertension 10/18/2007  ? ? ?PCP: Fayrene Helper, MD ? ?REFERRING PROVIDER: Fayrene Helper, MD ? ?REFERRING DIAG:  Joint pain ? ?THERAPY DIAG:  ?No diagnosis found. ? ?ONSET DATE: 08/17/2021 ? ?SUBJECTIVE:                                                                                                                                                                                          ? ?SUBJECTIVE STATEMENT: ?Pt stated she is feeling good, took pain medication earlier today and reports some relief following bowel movement.  No reports of pain currently.  Has began the HEP without  questions, stated it's hard to tighten core without holding breathe. ?  ?PERTINENT HISTORY:  ?Cervical pain, LT TKR , B shoulder pain, fibromyalgia  ? ?PAIN:  ?Are you having pain? No. ?Pain location: low back  ?Pain description: aching  ?Aggravating factors: sitting  ?Relieving factors: tylenol ? ? ?PRECAUTIONS: None ? ?WEIGHT BEARING RESTRICTIONS No ? ?FALLS:  ?Has patient fallen in last 6 months? No ? ?LIVING ENVIRONMENT: ?Lives with: lives with their family ?Lives in: House/apartment ?Stairs: No ?Has following equipment at home: None ? ?OCCUPATION: disability  ? ?PLOF: Independent with basic ADLs ? ?PATIENT GOALS less pain  ? ? ?OBJECTIVE:  ? ?DIAGNOSTIC FINDINGS:  ?None  ? ?COGNITION: ? Overall cognitive status: Within functional limits for tasks assessed   ?  ? ?MUSCLE LENGTH: ?Hamstrings: Right 170 deg; Left 170 deg ?POSTURE:  ?No issues seen ? ? ? ?LUMBAR ROM:  ? ?Active  A/PROM  ?10/14/2021  ?Flexion WNLreps no change   ?Extension WNL; reps no change    ?Right lateral flexion WNL  ?Left lateral flexion WNL  ?Right rotation WNL  ?Left rotation WNL  ? (Blank rows = not tested) ? ?LE MMT: ? ?MMT Right ?10/14/2021 Left ?10/20/2021  ?Hip flexion 5/5 5/5  ?Hip extension 4/5 4/5  ?Hip abduction 5/5 5/5  ?Hip adduction    ?Hip internal rotation    ?Hip external rotation    ?Knee flexion 5/5 5/5  ?Knee extension 5/5 5/5  ?Ankle dorsiflexion 5/5 5/5  ? ? ? ?FUNCTIONAL TESTS:  ?30 seconds chair stand test- 12x ;  Single leg stance:  RT: 4"; LT: 60"  ? ?GAIT: ?Distance walked: 452 ?Assistive device utilized: None ?Level of assistance: Complete Independence ? ? ? ? ?TODAY'S TREATMENT  ?10/20/21:  ?Supine: Diaphragmatic breathing 4x 1' ?Abdominal bracing paired with exhalation 4' ?Bent knee raise 10x 5" with ab set cueing to breath ?SKTC 2x 20" ?Bridge 10x 5" ?LTR 5x 10" ? ?Prone: Heel squeeze ?SLR 10x5" ? ?Eval:Knee to chest, hamstring stretch:  B 2 x20" ?Abdominal bracing, bridge x 10  ? ? ?PATIENT EDUCATION:  ?Education  details: HEP ?Person educated: Patient ?Education  method: Explanation ?Education comprehension: verbalized understanding ? ? ?HOME EXERCISE PROGRAM: ?HEP: Eval:Knee to chest, hamstring stretch:  B 2 x20" ?Abdominal bracing, bridge x 10  ? ?10/20/21:  Bent knee raise with ab set ? ? ? ?ASSESSMENT: ? ?CLINICAL IMPRESSION: ? Reviewed goals and current HEP to assure proper mechanics, pt required verbal and tactile cueing to improve abdominal sets paired with exhalation to encourage breathing with all exercises.  Session focus with core stability, proximal strengthening and stretches for mobility.  Pt tolerated well to session with no reports of increased pain, did c/o BLE cramping through session.   ? ? ?OBJECTIVE IMPAIRMENTS decreased balance, decreased activity tolerance, impaired perceived functional ability, and pain.  ? ?ACTIVITY LIMITATIONS cleaning.  ? ?PERSONAL FACTORS Fitness, Past/current experiences, Time since onset of injury/illness/exacerbation, and 1 comorbidity: fibromyalgia  are also affecting patient's functional outcome.  ? ? ?REHAB POTENTIAL: Fair   ? ?CLINICAL DECISION MAKING: Stable/uncomplicated ? ?EVALUATION COMPLEXITY: Low ? ? ?GOALS: ?Goals reviewed with patient? No ? ?SHORT TERM GOALS: Target date: 11/03/2021 ? ?Pt to be I in HEP to allow her back pain to be no greater than a 5/10 ?Baseline: ?Goal status: Ongoing ? ?2.  Pt to be able to sit for 30 minutes without increased pain ?Baseline:  ?Goal status: Ongoing ? ? ?LONG TERM GOALS: Target date: 11/17/2021 ? ?PT to be I in advanced HEP to allow her back pain to be no greater than a 3/10 in the past week  ?Baseline:  ?Goal status: Ongoing ? ?2.  PT to be able to complete her housework without having to take a break  ?Baseline:  ?Goal status: Ongoing ?3.  Pt to be able to single leg stance for 20 seconds on her Rt LE to feel comfortable walking on uneven ground  ?Goal Status: Ongoing ? ?PLAN: ?PT FREQUENCY: 2x/week ? ?PT DURATION: 4 weeks ? ?PLANNED  INTERVENTIONS: Therapeutic exercises, Therapeutic activity, Neuromuscular re-education, Balance training, Gait training, Patient/Family education, and Joint mobilization. ? ?PLAN FOR NEXT SESSION: Continue

## 2021-10-25 ENCOUNTER — Encounter (HOSPITAL_COMMUNITY): Payer: Self-pay

## 2021-10-25 ENCOUNTER — Ambulatory Visit (HOSPITAL_COMMUNITY): Payer: Medicare Other

## 2021-10-25 DIAGNOSIS — M5459 Other low back pain: Secondary | ICD-10-CM | POA: Diagnosis not present

## 2021-10-25 DIAGNOSIS — M545 Low back pain, unspecified: Secondary | ICD-10-CM | POA: Diagnosis not present

## 2021-10-25 NOTE — Therapy (Signed)
?OUTPATIENT PHYSICAL THERAPY THORACOLUMBAR TREATMENT ? ? ?Patient Name: Shirley Bush ?MRN: [id number] ?DOB:01-May-1956, 66 y.o., female ?Today's Date: 10/25/2021 ? ?Visit Number 3  ?Number of Visits 8   ?Date for PT Re-Evaluation 11/13/21   ?Authorization Type [employer] Medicare   ?Progress Note Due on Visit 8   ?PT Start Time [phone]  ?PT Stop Time 2841  ?PT Time Calculation (min) 47 min   ?Activity tolerance: pt tolerated treatment well ?Behavior:  WFL for tasks assessed/performance ? ? ?Past Medical History:  ?Diagnosis Date  ? Allergy Don't remember  ? Don't remember  ? Anxiety   ? Arthritis In 2007  ? In both of my shoulders and my knees  ? Depression 2002  ? Depression   ? Phreesia 06/28/2020  ? Depression   ? Phreesia 07/23/2020  ? Fibromyalgia   ? Heart murmur Don't remember  ? I was in 51s  ? Hypertension 2004  ? Medial meniscus tear 04/2013  ? left  ? Migraines   ? ?Past Surgical History:  ?Procedure Laterality Date  ? [place]  ? [place]  ? [place]  ? CESAREAN SECTION N/A   ? Phreesia 06/28/2020  ? COLONOSCOPY  02/05/2009  ? COLONOSCOPY N/A 03/06/2014  ? Procedure: COLONOSCOPY;  Surgeon: Danie Binder, MD;  [facility];  Service: Endoscopy;  Laterality: N/A;  10:30 AM  ? COLONOSCOPY WITH PROPOFOL N/A 01/14/2019  ? Surgeon: Danie Binder, MD;  one 3 mm tubular adenoma in the mid ascending colon, moderate diverticulosis in entire colon, external and internal hemorrhoids, tortuous left colon. Recommended repeat in 5 years.   ? FINGER SURGERY Right   ? long finger  ? HERNIA REPAIR N/A   ? Phreesia 06/28/2020  ? JOINT REPLACEMENT N/A   ? Phreesia 06/28/2020  ? KNEE ARTHROSCOPY WITH MEDIAL MENISECTOMY Left 04/24/2013  ? Procedure: LEFT KNEE ARTHROSCOPY CHONDROPLASTY WITH MEDIAL MENISECTOMY;  Surgeon: Ninetta Lights, MD;  Location: [place];  Service: Orthopedics;  Laterality: Left;  ? SHOULDER ARTHROSCOPY Left 2009  ? SHOULDER ARTHROSCOPY  Right   ? SHOULDER ARTHROSCOPY WITH ROTATOR CUFF REPAIR Left 11/25/2004  ? TOTAL KNEE ARTHROPLASTY Left 11/11/2014  ? Procedure: LEFT TOTAL KNEE ARTHROPLASTY;  Surgeon: Kathryne Hitch, MD;  Location: [place];  Service: Orthopedics;  Laterality: Left;  ? TUBAL LIGATION  1992  ? UMBILICAL HERNIA REPAIR  2008  ? ?Patient Active Problem List  ? Diagnosis Date Noted  ? Multiple drug allergies 09/26/2021  ? Generalized joint pain 09/26/2021  ? Otalgia, left 08/15/2021  ? Hearing loss 08/15/2021  ? Leg swelling 06/12/2021  ? GAD (generalized anxiety disorder) 04/15/2021  ? High vitamin D level 04/15/2021  ? Flatulence 09/23/2020  ? Rash and nonspecific skin eruption 07/02/2020  ? Tinea capitis 07/18/2019  ? Alopecia 07/18/2019  ? Ridged nails 07/18/2019  ? Situational anxiety 05/18/2019  ? Constipation 04/25/2018  ? Change in bowel movement 01/23/2018  ? FH: colon cancer in relative <64 years old 08/09/2017  ? MDD (major depressive disorder), recurrent episode, moderate ([place]) 07/04/2017  ? DJD (degenerative joint disease) of knee 11/11/2014  ? Allergic rhinitis 07/19/2014  ? Plantar fasciitis 05/08/2013  ? Bilateral hand pain 04/18/2013  ? Heel pain 04/18/2013  ? Inflammatory polyarthropathy ([place]) 02/28/2013  ? Back pain with radiation 02/07/2013  ? Fibromyalgia 04/05/2012  ? Hyperlipidemia 02/11/2011  ? Chest pain in adult 05/04/2008  ? FIBROIDS, UTERUS 03/31/2008  ? NECK  AND BACK PAIN 02/14/2008  ? Malignant hypertension 10/18/2007  ? ? ?PCP: Fayrene Helper, MD ? ?REFERRING PROVIDER: Fayrene Helper, MD ? ?REFERRING DIAG: Joint pain ? ?THERAPY DIAG:  ?Other low back pain ? ?ONSET DATE: 08/17/2021 ? ?SUBJECTIVE:                                                                                                                                                                                          ? ?SUBJECTIVE STATEMENT: ?Reports she took pain medication prior session, did have some soreness following last session.  Pain  scale 5/10 LBP and neck pain.  Reports she hasn't complete HEP, thought about it but didn't complete.   ?  ?PERTINENT HISTORY:  ?Cervical pain, LT TKR , B shoulder pain, fibromyalgia  ? ?PAIN:  ?PAIN:  ?Are you having pain? Yes: NPRS scale: 5/10 ?Pain location: LBP ?Pain description: sore achey pain ?Aggravating factors: sitting ?Relieving factors: Tylenol ? ? ? ?PRECAUTIONS: None ? ?WEIGHT BEARING RESTRICTIONS No ? ?FALLS:  ?Has patient fallen in last 6 months? No ? ?LIVING ENVIRONMENT: ?Lives with: lives with their family ?Lives in: House/apartment ?Stairs: No ?Has following equipment at home: None ? ?OCCUPATION: disability  ? ?PLOF: Independent with basic ADLs ? ?PATIENT GOALS less pain  ? ? ?OBJECTIVE:  ? ?DIAGNOSTIC FINDINGS:  ?None  ? ?COGNITION: ? Overall cognitive status: Within functional limits for tasks assessed   ?  ? ?MUSCLE LENGTH: ?Hamstrings: Right 170 deg; Left 170 deg ?POSTURE:  ?No issues seen ? ? ? ?LUMBAR ROM:  ? ?Active  A/PROM  ?10/14/2021  ?Flexion WNLreps no change   ?Extension WNL; reps no change    ?Right lateral flexion WNL  ?Left lateral flexion WNL  ?Right rotation WNL  ?Left rotation WNL  ? (Blank rows = not tested) ? ?LE MMT: ? ?MMT Right ?10/14/2021 Left ?10/25/2021  ?Hip flexion 5/5 5/5  ?Hip extension 4/5 4/5  ?Hip abduction 5/5 5/5  ?Hip adduction    ?Hip internal rotation    ?Hip external rotation    ?Knee flexion 5/5 5/5  ?Knee extension 5/5 5/5  ?Ankle dorsiflexion 5/5 5/5  ? ? ? ?FUNCTIONAL TESTS:  ?30 seconds chair stand test- 12x ;  Single leg stance:  RT: 4"; LT: 60"  ? ?GAIT: ?Distance walked: 452 ?Assistive device utilized: None ?Level of assistance: Complete Independence ? ? ? ? ?TODAY'S TREATMENT  ?10/25/21: ?  Supine: Log rolling ?  Diaphragmatic breathing 2x 1' ?  Abdominal sets 3-5" holds for 2 minutes ?  Posterior pelvic rotation ?  Bent knee raise 10x 5" with ab set cueing to breath ?  Bridge 25x ?  SLR 10x with ab set ?  Cervical retraction 10x 3" ? ?  Prone: SLR  10x ? ?  Seated: posture ?  Chin tucks 10x 3"  ?   ?    ?10/20/21:  ?Supine: Diaphragmatic breathing 4x 1' ?Abdominal bracing paired with exhalation 4' ?Bent knee raise 10x 5" with ab set cueing to breath ?SKTC 2x 20" ?Bridge 10x 5" ?LTR 5x 10" ? ?Prone: Heel squeeze ?SLR 10x5" ? ?Eval:Knee to chest, hamstring stretch:  B 2 x20" ?Abdominal bracing, bridge x 10  ? ? ?PATIENT EDUCATION:  ?Education details: HEP ?Person educated: Patient ?Education method: Explanation ?Education comprehension: verbalized understanding ? ? ?HOME EXERCISE PROGRAM: ?HEP: Eval:Knee to chest, hamstring stretch:  B 2 x20" ?Abdominal bracing, bridge x 10  ? ?10/20/21:  Bent knee raise with ab set ? ? ? ?ASSESSMENT: ? ?CLINICAL IMPRESSION: ? Session focus with core stability, tactile and verbal cueing to improve abdominal activation prior movements.  Pt presents with some difficulty with abdominal stability paired with breathing.  Educated log rolling for bed mobility to reduce stress on back, postural strengthening and importance of HEP compliance for maximal benefits.  Reports pain reduced at EOS. ? ? ?OBJECTIVE IMPAIRMENTS decreased balance, decreased activity tolerance, impaired perceived functional ability, and pain.  ? ?ACTIVITY LIMITATIONS cleaning.  ? ?PERSONAL FACTORS Fitness, Past/current experiences, Time since onset of injury/illness/exacerbation, and 1 comorbidity: fibromyalgia  are also affecting patient's functional outcome.  ? ? ?REHAB POTENTIAL: Fair   ? ?CLINICAL DECISION MAKING: Stable/uncomplicated ? ?EVALUATION COMPLEXITY: Low ? ? ?GOALS: ?Goals reviewed with patient? No ? ?SHORT TERM GOALS: Target date: 11/08/2021 ? ?Pt to be I in HEP to allow her back pain to be no greater than a 5/10 ?Baseline: ?Goal status: Ongoing ? ?2.  Pt to be able to sit for 30 minutes without increased pain ?Baseline:  ?Goal status: Ongoing ? ? ?LONG TERM GOALS: Target date: 11/22/2021 ? ?PT to be I in advanced HEP to allow her back pain to be no  greater than a 3/10 in the past week  ?Baseline:  ?Goal status: Ongoing ? ?2.  PT to be able to complete her housework without having to take a break  ?Baseline:  ?Goal status: Ongoing ?3.  Pt to be able to singl

## 2021-10-28 ENCOUNTER — Telehealth: Payer: Medicare Other

## 2021-11-02 ENCOUNTER — Encounter (HOSPITAL_COMMUNITY): Payer: Self-pay

## 2021-11-02 ENCOUNTER — Ambulatory Visit (HOSPITAL_COMMUNITY): Payer: Medicare Other

## 2021-11-02 DIAGNOSIS — M5459 Other low back pain: Secondary | ICD-10-CM

## 2021-11-02 DIAGNOSIS — M545 Low back pain, unspecified: Secondary | ICD-10-CM | POA: Diagnosis not present

## 2021-11-02 NOTE — Therapy (Signed)
?OUTPATIENT PHYSICAL THERAPY THORACOLUMBAR TREATMENT ? ? ?Patient Name: Shirley Bush ?MRN: [id number] ?DOB:[birth year], 66 y.o., female ?Today's Date: 11/02/2021 ? ?Visit Number 3  ?Number of Visits 8   ?Date for PT Re-Evaluation 11/13/21   ?Authorization Type [employer] Medicare   ?Progress Note Due on Visit 8   ?PT Start Time [phone]  ?PT Stop Time 3614  ?PT Time Calculation (min) 47 min   ?Activity tolerance: pt tolerated treatment well ?Behavior:  WFL for tasks assessed/performance ? ? ?Past Medical History:  ?Diagnosis Date  ? Allergy Don't remember  ? Don't remember  ? Anxiety   ? Arthritis In 2007  ? In both of my shoulders and my knees  ? Depression 2002  ? Depression   ? Phreesia 06/28/2020  ? Depression   ? [employer]reesia 07/23/2020  ? Fibromyalgia   ? Heart murmur Don't remember  ? I was in 68s  ? Hypertension 2004  ? Medial meniscus tear 04/2013  ? left  ? Migraines   ? ?Past Surgical History:  ?Procedure Laterality Date  ? [place]  ? [place]  ? [place]  ? CESAREAN SECTION N/A   ? Phreesia 06/28/2020  ? COLONOSCOPY  02/05/2009  ? COLONOSCOPY N/A 03/06/2014  ? Procedure: COLONOSCOPY;  Surgeon: Danie Binder, MD;  [facility];  Service: Endoscopy;  Laterality: N/A;  10:30 AM  ? COLONOSCOPY WITH PROPOFOL N/A 01/14/2019  ? Surgeon: Danie Binder, MD;  one 3 mm tubular adenoma in the mid ascending colon, moderate diverticulosis in entire colon, external and internal hemorrhoids, tortuous left colon. Recommended repeat in 5 years.   ? FINGER SURGERY Right   ? long finger  ? HERNIA REPAIR N/A   ? [employer]reesia 06/28/2020  ? JOINT REPLACEMENT N/A   ? Phreesia 06/28/2020  ? KNEE ARTHROSCOPY WITH MEDIAL MENISECTOMY Left 04/24/2013  ? Procedure: LEFT KNEE ARTHROSCOPY CHONDROPLASTY WITH MEDIAL MENISECTOMY;  Surgeon: Ninetta Lights, MD;  Location: [facility];  Service: Orthopedics;  Laterality: Left;  ? SHOULDER ARTHROSCOPY Left 2009  ? SHOULDER ARTHROSCOPY  Right   ? SHOULDER ARTHROSCOPY WITH ROTATOR CUFF REPAIR Left 11/25/2004  ? TOTAL KNEE ARTHROPLASTY Left 11/11/2014  ? Procedure: LEFT TOTAL KNEE ARTHROPLASTY;  Surgeon: Kathryne Hitch, MD;  Location: [facility];  Service: Orthopedics;  Laterality: Left;  ? TUBAL LIGATION  1992  ? UMBILICAL HERNIA REPAIR  2008  ? ?Patient Active Problem List  ? Diagnosis Date Noted  ? Multiple drug allergies 09/26/2021  ? Generalized joint pain 09/26/2021  ? Otalgia, left 08/15/2021  ? Hearing loss 08/15/2021  ? Leg swelling 06/12/2021  ? GAD (generalized anxiety disorder) 04/15/2021  ? High vitamin D level 04/15/2021  ? Flatulence 09/23/2020  ? Rash and nonspecific skin eruption 07/02/2020  ? Tinea capitis 07/18/2019  ? Alopecia 07/18/2019  ? Ridged nails 07/18/2019  ? Situational anxiety 05/18/2019  ? Constipation 04/25/2018  ? Change in bowel movement 01/23/2018  ? FH: colon cancer in relative <44 years old 08/09/2017  ? MDD (major depressive disorder), recurrent episode, moderate ([place]) 07/04/2017  ? DJD (degenerative joint disease) of knee 11/11/2014  ? Allergic rhinitis 07/19/2014  ? Plantar fasciitis 05/08/2013  ? Bilateral hand pain 04/18/2013  ? Heel pain 04/18/2013  ? Inflammatory polyarthropathy ([place]) 02/28/2013  ? Back pain with radiation 02/07/2013  ? Fibromyalgia 04/05/2012  ? Hyperlipidemia 02/11/2011  ? Chest pain in adult 05/04/2008  ? FIBROIDS, UTERUS 03/31/2008  ? NECK  AND BACK PAIN 02/14/2008  ? Malignant hypertension 10/18/2007  ? ? ?PCP: Fayrene Helper, MD ? ?REFERRING PROVIDER: Fayrene Helper, MD ? ?REFERRING DIAG: Joint pain ? ?THERAPY DIAG:  ?No diagnosis found. ? ?ONSET DATE: 08/17/2021 ? ?SUBJECTIVE:                                                                                                                                                                                          ? ?SUBJECTIVE STATEMENT: ?Pt reports she feels hips are improving ability to walk and stand further distance without pain.  No  reports of pain today.  Plans to begin walking program, hasn't began yet though. ?  ?PERTINENT HISTORY:  ?Cervical pain, LT TKR , B shoulder pain, fibromyalgia  ? ?PAIN:  ?PAIN:  ?Are you having pain? No: NPRS scale: 0/10 ?Pain location: LBP ?Pain description: sore achey pain ?Aggravating factors: sitting ?Relieving factors: Tylenol ? ? ? ?PRECAUTIONS: None ? ?WEIGHT BEARING RESTRICTIONS No ? ?FALLS:  ?Has patient fallen in last 6 months? No ? ?LIVING ENVIRONMENT: ?Lives with: lives with their family ?Lives in: House/apartment ?Stairs: No ?Has following equipment at home: None ? ?OCCUPATION: disability  ? ?PLOF: Independent with basic ADLs ? ?PATIENT GOALS less pain  ? ? ?OBJECTIVE:  ? ?DIAGNOSTIC FINDINGS:  ?None  ? ?COGNITION: ? Overall cognitive status: Within functional limits for tasks assessed   ?  ? ?MUSCLE LENGTH: ?Hamstrings: Right 170 deg; Left 170 deg ?POSTURE:  ?No issues seen ? ? ? ?LUMBAR ROM:  ? ?Active  A/PROM  ?10/14/2021  ?Flexion WNLreps no change   ?Extension WNL; reps no change    ?Right lateral flexion WNL  ?Left lateral flexion WNL  ?Right rotation WNL  ?Left rotation WNL  ? (Blank rows = not tested) ? ?LE MMT: ? ?MMT Right ?10/14/2021 Left ?11/02/2021  ?Hip flexion 5/5 5/5  ?Hip extension 4/5 4/5  ?Hip abduction 5/5 5/5  ?Hip adduction    ?Hip internal rotation    ?Hip external rotation    ?Knee flexion 5/5 5/5  ?Knee extension 5/5 5/5  ?Ankle dorsiflexion 5/5 5/5  ? ? ? ?FUNCTIONAL TESTS:  ?30 seconds chair stand test- 12x ;  Single leg stance:  RT: 4"; LT: 60"  ? ?GAIT: ?Distance walked: 452 ?Assistive device utilized: None ?Level of assistance: Complete Independence ? ? ? ? ?TODAY'S TREATMENT  ?11/02/21: ?Supine: abdominal bracing with posterior pelvic tilt x 2' ? SLR with ab set 10x ? Hamstring stretch with hands behind 2x 30" ? ?Quadruped: UE/LE 10x 5" ? ?Seated:  Wback 10x5" ? RTB row 15x5" ? Chin tuck 10 x 5" ?  STS 10x no HHA ? ? ?10/25/21: ?  Supine: Log rolling ?  Diaphragmatic breathing  2x 1' ?  Abdominal sets 3-5" holds for 2 minutes ?  Posterior pelvic rotation ?  Bent knee raise 10x 5" with ab set cueing to breath ?  Bridge 25x ?  SLR 10x with ab set ?  Cervical retraction 10x 3" ? ?  Prone: SLR 10x ? ?  Seated: posture ?  Chin tucks 10x 3"  ?   ?    ?10/20/21:  ?Supine: Diaphragmatic breathing 4x 1' ?Abdominal bracing paired with exhalation 4' ?Bent knee raise 10x 5" with ab set cueing to breath ?SKTC 2x 20" ?Bridge 10x 5" ?LTR 5x 10" ? ?Prone: Heel squeeze ?SLR 10x5" ? ?Eval:Knee to chest, hamstring stretch:  B 2 x20" ?Abdominal bracing, bridge x 10  ? ? ?PATIENT EDUCATION:  ?Education details: HEP ?Person educated: Patient ?Education method: Explanation ?Education comprehension: verbalized understanding ? ? ?HOME EXERCISE PROGRAM: ?HEP: Eval:Knee to chest, hamstring stretch:  B 2 x20" ?Abdominal bracing, bridge x 10  ? ?10/20/21:  Bent knee raise with ab set ? ?11/02/21: Quadruped UE/LE and STS ? ? ? ?ASSESSMENT: ? ?CLINICAL IMPRESSION: ?Progressing well towards POC.  Pt presents with improved abdominal activation and ability to complete ab set during SLR with less abdominal fatigue and minimal cueing required. Progress core stability and gluteal strengthening with additional quadruped UE/LE and STS, good form following initial cueing for core activation.  Added  new exercises to HEP with printout given.  No reports of pain through session.   ? ? ?OBJECTIVE IMPAIRMENTS decreased balance, decreased activity tolerance, impaired perceived functional ability, and pain.  ? ?ACTIVITY LIMITATIONS cleaning.  ? ?PERSONAL FACTORS Fitness, Past/current experiences, Time since onset of injury/illness/exacerbation, and 1 comorbidity: fibromyalgia  are also affecting patient's functional outcome.  ? ? ?REHAB POTENTIAL: Fair   ? ?CLINICAL DECISION MAKING: Stable/uncomplicated ? ?EVALUATION COMPLEXITY: Low ? ? ?GOALS: ?Goals reviewed with patient? No ? ?SHORT TERM GOALS: Target date: 11/16/2021 ? ?Pt to be I in HEP  to allow her back pain to be no greater than a 5/10 ?Baseline: ?Goal status: Ongoing ? ?2.  Pt to be able to sit for 30 minutes without increased pain ?Baseline:  ?Goal status: Ongoing ? ? ?LONG TERM G

## 2021-11-04 ENCOUNTER — Ambulatory Visit (HOSPITAL_COMMUNITY): Payer: Medicare Other

## 2021-11-04 DIAGNOSIS — M5459 Other low back pain: Secondary | ICD-10-CM | POA: Diagnosis not present

## 2021-11-04 DIAGNOSIS — M545 Low back pain, unspecified: Secondary | ICD-10-CM | POA: Diagnosis not present

## 2021-11-04 NOTE — Therapy (Signed)
OUTPATIENT PHYSICAL THERAPY THORACOLUMBAR TREATMENT   Patient Name: Shirley Bush MRN: [id number] DOB:February 02, 1956, 66 y.o., female Today's Date: 11/04/2021  END OF SESSION:   PT End of Session - 11/04/21 1114     Visit Number 5    Number of Visits 8    Date for PT Re-Evaluation 11/13/21    Authorization Type [employer] Medicare    Progress Note Due on Visit 8    PT Start Time 1110    PT Stop Time 1200    PT Time Calculation (min) 50 min    Activity Tolerance Patient tolerated treatment well;No increased pain    Behavior During Therapy [facility] for tasks assessed/performed               Past Medical History:  Diagnosis Date   Allergy Don't remember   Don't remember   Anxiety    Arthritis In 2007   In both of my shoulders and my knees   Depression 2002   Depression    Phreesia 06/28/2020   Depression    Phreesia 07/23/2020   Fibromyalgia    Heart murmur Don't remember   I was in 20s   Hypertension 2004   Medial meniscus tear 04/2013   left   Migraines    Past Surgical History:  Procedure Laterality Date   CESAREAN SECTION  1978   CESAREAN SECTION  1980   CESAREAN SECTION  1992   CESAREAN SECTION N/A    Phreesia 06/28/2020   COLONOSCOPY  02/05/2009   COLONOSCOPY N/A 03/06/2014   Procedure: COLONOSCOPY;  Surgeon: West Bali, MD;  [facility];  Service: Endoscopy;  Laterality: N/A;  10:30 AM   COLONOSCOPY WITH PROPOFOL N/A 01/14/2019   Surgeon: West Bali, MD;  one 3 mm tubular adenoma in the mid ascending colon, moderate diverticulosis in entire colon, external and internal hemorrhoids, tortuous left colon. Recommended repeat in 5 years.    FINGER SURGERY Right    long finger   HERNIA REPAIR N/A    Phreesia 06/28/2020   JOINT REPLACEMENT N/A    Phreesia 06/28/2020   KNEE ARTHROSCOPY WITH MEDIAL MENISECTOMY Left 04/24/2013   Procedure: LEFT KNEE ARTHROSCOPY CHONDROPLASTY WITH MEDIAL MENISECTOMY;  Surgeon: Loreta Ave, MD;  Location: [facility]  ;  Service: Orthopedics;  Laterality: Left;   SHOULDER ARTHROSCOPY Left 2009   SHOULDER ARTHROSCOPY Right    SHOULDER ARTHROSCOPY WITH ROTATOR CUFF REPAIR Left 11/25/2004   TOTAL KNEE ARTHROPLASTY Left 11/11/2014   Procedure: LEFT TOTAL KNEE ARTHROPLASTY;  Surgeon: Mckinley Jewel, MD;  Location: [facility];  Service: Orthopedics;  Laterality: Left;   TUBAL LIGATION  1992   UMBILICAL HERNIA REPAIR  2008   Patient Active Problem List   Diagnosis Date Noted   Multiple drug allergies 09/26/2021   Generalized joint pain 09/26/2021   Otalgia, left 08/15/2021   Hearing loss 08/15/2021   Leg swelling 06/12/2021   GAD (generalized anxiety disorder) 04/15/2021   High vitamin D level 04/15/2021   Flatulence 09/23/2020   Rash and nonspecific skin eruption 07/02/2020   Tinea capitis 07/18/2019   Alopecia 07/18/2019   Ridged nails 07/18/2019   Situational anxiety 05/18/2019   Constipation 04/25/2018   Change in bowel movement 01/23/2018   FH: colon cancer in relative <66 years old 08/09/2017   MDD (major depressive disorder), recurrent episode, moderate ([facility]) 07/04/2017   DJD (degenerative joint disease) of knee 11/11/2014   Allergic rhinitis 07/19/2014   Plantar fasciitis 05/08/2013  Bilateral hand pain 04/18/2013   Heel pain 04/18/2013   Inflammatory polyarthropathy ([facility]) 02/28/2013   Back pain with radiation 02/07/2013   Fibromyalgia 04/05/2012   Hyperlipidemia 02/11/2011   Chest pain in adult 05/04/2008   FIBROIDS, UTERUS 03/31/2008   NECK AND BACK PAIN 02/14/2008   Malignant hypertension 10/18/2007    PCP: Kerri Perches, MD  REFERRING PROVIDER: Kerri Perches, MD  REFERRING DIAG: Joint pain  THERAPY DIAG:  Other low back pain  Low back pain, unspecified back pain laterality, unspecified chronicity, unspecified whether sciatica present  ONSET DATE: 08/17/2021  SUBJECTIVE:                                                                                                                                                                                            SUBJECTIVE STATEMENT:  Patient not feeling good today; states her fibromyalgia is flaring some.  Reports she did her HEP; showing to her granddaughter the bird dog exercise and "I think I overdid it".   PERTINENT HISTORY:  Cervical pain, LT TKR , B shoulder pain, fibromyalgia   PAIN:  PAIN:  Are you having pain? No: NPRS scale: 5/10 Pain location: LBP Pain description: sore achey pain Aggravating factors: sitting Relieving factors: Tylenol    PRECAUTIONS: None  WEIGHT BEARING RESTRICTIONS No  FALLS:  Has patient fallen in last 6 months? No  LIVING ENVIRONMENT: Lives with: lives with their family Lives in: House/apartment Stairs: No Has following equipment at home: None  OCCUPATION: disability   PLOF: Independent with basic ADLs  PATIENT GOALS less pain    OBJECTIVE: (objective measures completed at initial evaluation unless otherwise dated)   DIAGNOSTIC FINDINGS:  None   COGNITION:  Overall cognitive status: Within functional limits for tasks assessed      MUSCLE LENGTH: Hamstrings: Right 170 deg; Left 170 deg POSTURE:  No issues seen    LUMBAR ROM:   Active  A/PROM  10/14/2021  Flexion WNLreps no change   Extension WNL; reps no change    Right lateral flexion WNL  Left lateral flexion WNL  Right rotation WNL  Left rotation WNL   (Blank rows = not tested)  LE MMT:  MMT Right 10/14/2021 Left 11/04/2021  Hip flexion 5/5 5/5  Hip extension 4/5 4/5  Hip abduction 5/5 5/5  Hip adduction    Hip internal rotation    Hip external rotation    Knee flexion 5/5 5/5  Knee extension 5/5 5/5  Ankle dorsiflexion 5/5 5/5     FUNCTIONAL TESTS:  30 seconds chair stand test- 12x ;  Single leg stance:  RT: 4";  LT: 60"   GAIT: Distance walked: 452 Assistive device utilized: None Level of assistance: Complete Independence     TODAY'S  TREATMENT  11/04/21 Supine: abdominal bracing with posterior pelvic tilt x 2'  SLR with ab set 10x each side  Hamstring stretch with hands behind 2x 30"  SKC 3 x 30" each  Bridge x 10  Seated:  Cervical retraction x 10   Wback 10x5"  Seated RTB 2 x 10  STS 10 from mat table no UE assist       11/02/21: Supine: abdominal bracing with posterior pelvic tilt x 2'  SLR with ab set 10x  Hamstring stretch with hands behind 2x 30"  Quadruped: UE/LE 10x 5"  Seated:  Wback 10x5"  RTB row 15x5"  Chin tuck 10 x 5"  STS 10x no HHA   10/25/21:   Supine: Log rolling   Diaphragmatic breathing 2x 1'   Abdominal sets 3-5" holds for 2 minutes   Posterior pelvic rotation   Bent knee raise 10x 5" with ab set cueing to breath   Bridge 25x   SLR 10x with ab set   Cervical retraction 10x 3"    Prone: SLR 10x    Seated: posture   Chin tucks 10x 3"        PATIENT EDUCATION:  Education details: HEP Person educated: Patient Education method: Explanation Education comprehension: verbalized understanding   HOME EXERCISE PROGRAM: HEP: Eval:Knee to chest, hamstring stretch:  B 2 x20" Abdominal bracing, bridge x 10   10/20/21:  Bent knee raise with ab set  11/02/21: Quadruped UE/LE and STS    ASSESSMENT:  CLINICAL IMPRESSION: Today's session focused on continued lumbar stabilization exercises.  Patient not feeling good today; states her fibromyalgia is flaring some today. 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    Did not progress to standing exercises today due to patient's pain levels today. Patient will benefit from continued skilled therapy interventions to address deficits and improve functional mobility.     OBJECTIVE IMPAIRMENTS decreased balance,  decreased activity tolerance, impaired perceived functional ability, and pain.   ACTIVITY LIMITATIONS cleaning.   PERSONAL FACTORS Fitness, Past/current experiences, Time since onset of injury/illness/exacerbation, and 1 comorbidity: fibromyalgia  are also affecting patient's functional outcome.    REHAB POTENTIAL: Fair    CLINICAL DECISION MAKING: Stable/uncomplicated  EVALUATION COMPLEXITY: Low   GOALS: Goals reviewed with patient? No  SHORT TERM GOALS: Target date: 11/18/2021  Pt to be I in HEP to allow her back pain to be no greater than a 5/10 Baseline: Goal status: Ongoing  2.  Pt to be able to sit for 30 minutes without increased pain Baseline:  Goal status: Ongoing   LONG TERM GOALS: Target date: 12/02/2021  PT to be I in advanced HEP to allow her back pain to be no greater than a 3/10 in the past week  Baseline:  Goal status: Ongoing  2.  PT to be able to complete her housework without having to take a break  Baseline:  Goal status: Ongoing 3.  Pt to be able to single leg stance for 20 seconds on her Rt LE to feel comfortable walking on uneven ground  Goal Status: Ongoing  PLAN: PT FREQUENCY: 2x/week  PT DURATION: 4 weeks  PLANNED INTERVENTIONS: Therapeutic exercises,  Therapeutic activity, Neuromuscular re-education, Balance training, Gait training, Patient/Family education, and Joint mobilization.  PLAN FOR NEXT SESSION: Continue with stretching and stabilization exercises.    Next session progress to squat, SLS, vector stance, paloff, and postural strengthening if able.  12:00 PM, 11/04/21 Belenda Alviar Small Bethania Schlotzhauer MPT [place] [facility] [place] [phone]

## 2021-11-09 ENCOUNTER — Encounter (HOSPITAL_COMMUNITY): Payer: Medicare Other

## 2021-11-10 ENCOUNTER — Ambulatory Visit (HOSPITAL_COMMUNITY): Payer: Medicare Other | Admitting: Physical Therapy

## 2021-11-10 DIAGNOSIS — M545 Low back pain, unspecified: Secondary | ICD-10-CM

## 2021-11-10 DIAGNOSIS — M5459 Other low back pain: Secondary | ICD-10-CM | POA: Diagnosis not present

## 2021-11-10 NOTE — Therapy (Signed)
?OUTPATIENT PHYSICAL THERAPY THORACOLUMBAR TREATMENT ?Progress Note ?Reporting Period 10/14/21 to 11/10/2021 ? ?See note below for Objective Data and Assessment of Progress/Goals.  ? ?  ? ?Patient Name: Shirley Bush ?MRN: [id number] ?DOB:03/02/1956, 66 y.o., female ?Today's Date: 11/10/2021 ? ?END OF SESSION:  ? PT End of Session - 11/10/21 1445   ? ? Visit Number 6   ? Number of Visits 14   ? Date for PT Re-Evaluation 12/09/21   ? Authorization Type [employer] Medicare; progress note completed visit #6   ? Progress Note Due on Visit 16   ? PT Start Time 4888   ? PT Stop Time 1523   ? PT Time Calculation (min) 38 min   ? Activity Tolerance Patient tolerated treatment well;No increased pain   ? Behavior During Therapy [facility] for tasks assessed/performed   ? ?  ?  ? ?  ? ? ? ? ?Past Medical History:  ?Diagnosis Date  ? Allergy Don't remember  ? Don't remember  ? Anxiety   ? Arthritis In 2007  ? In both of my shoulders and my knees  ? Depression 2002  ? Depression   ? Phreesia 06/28/2020  ? Depression   ? Phreesia 07/23/2020  ? Fibromyalgia   ? Heart murmur Don't remember  ? I was in 74s  ? Hypertension 2004  ? Medial meniscus tear 04/2013  ? left  ? Migraines   ? ?Past Surgical History:  ?Procedure Laterality Date  ? [place]  ? [place]  ? [place]  ? CESAREAN SECTION N/A   ? Phreesia 06/28/2020  ? COLONOSCOPY  02/05/2009  ? COLONOSCOPY N/A 03/06/2014  ? Procedure: COLONOSCOPY;  Surgeon: Danie Binder, MD;  [facility];  Service: Endoscopy;  Laterality: N/A;  10:30 AM  ? COLONOSCOPY WITH PROPOFOL N/A 01/14/2019  ? Surgeon: Danie Binder, MD;  one 3 mm tubular adenoma in the mid ascending colon, moderate diverticulosis in entire colon, external and internal hemorrhoids, tortuous left colon. Recommended repeat in 5 years.   ? FINGER SURGERY Right   ? long finger  ? HERNIA REPAIR N/A   ? Phreesia 06/28/2020  ? JOINT REPLACEMENT N/A   ? Phreesia 06/28/2020  ? KNEE  ARTHROSCOPY WITH MEDIAL MENISECTOMY Left 04/24/2013  ? Procedure: LEFT KNEE ARTHROSCOPY CHONDROPLASTY WITH MEDIAL MENISECTOMY;  Surgeon: Ninetta Lights, MD;  Location: [place];  Service: Orthopedics;  Laterality: Left;  ? SHOULDER ARTHROSCOPY Left 2009  ? SHOULDER ARTHROSCOPY Right   ? SHOULDER ARTHROSCOPY WITH ROTATOR CUFF REPAIR Left 11/25/2004  ? TOTAL KNEE ARTHROPLASTY Left 11/11/2014  ? Procedure: LEFT TOTAL KNEE ARTHROPLASTY;  Surgeon: Kathryne Hitch, MD;  Location: [place];  Service: Orthopedics;  Laterality: Left;  ? TUBAL LIGATION  1992  ? UMBILICAL HERNIA REPAIR  2008  ? ?Patient Active Problem List  ? Diagnosis Date Noted  ? Multiple drug allergies 09/26/2021  ? Generalized joint pain 09/26/2021  ? Otalgia, left 08/15/2021  ? Hearing loss 08/15/2021  ? Leg swelling 06/12/2021  ? GAD (generalized anxiety disorder) 04/15/2021  ? High vitamin D level 04/15/2021  ? Flatulence 09/23/2020  ? Rash and nonspecific skin eruption 07/02/2020  ? Tinea capitis 07/18/2019  ? Alopecia 07/18/2019  ? Ridged nails 07/18/2019  ? Situational anxiety 05/18/2019  ? Constipation 04/25/2018  ? Change in bowel movement 01/23/2018  ? FH: colon cancer in relative <31 years old 08/09/2017  ? MDD (major depressive disorder),  recurrent episode, moderate ([place]) 07/04/2017  ? DJD (degenerative joint disease) of knee 11/11/2014  ? Allergic rhinitis 07/19/2014  ? Plantar fasciitis 05/08/2013  ? Bilateral hand pain 04/18/2013  ? Heel pain 04/18/2013  ? Inflammatory polyarthropathy ([place]) 02/28/2013  ? Back pain with radiation 02/07/2013  ? Fibromyalgia 04/05/2012  ? Hyperlipidemia 02/11/2011  ? Chest pain in adult 05/04/2008  ? FIBROIDS, UTERUS 03/31/2008  ? NECK AND BACK PAIN 02/14/2008  ? Malignant hypertension 10/18/2007  ? ? ?PCP: Fayrene Helper, MD ? ?REFERRING PROVIDER: Fayrene Helper, MD ? ?REFERRING DIAG: Joint pain ? ?THERAPY DIAG:  ?Other low back pain ? ?Low back pain, unspecified back pain laterality,  unspecified chronicity, unspecified whether sciatica present ? ?ONSET DATE: 08/17/2021 ? ?SUBJECTIVE:                                                                                                                                                                                          ? ?SUBJECTIVE STATEMENT: ? Patient states she feels like she has improved but not where she wants to be.  States 20% overall improvement.  States she doesn't feel like she's mastered the correct breathing with her exercises. ? ? ?PERTINENT HISTORY:  ?Cervical pain, LT TKR , B shoulder pain, fibromyalgia  ? ?PAIN:  ?PAIN:  ?Are you having pain? No: NPRS scale: 5/10 ?Pain location: LBP ?Pain description: sore achey pain ?Aggravating factors: sitting ?Relieving factors: Tylenol ? ? ? ?PRECAUTIONS: None ? ?WEIGHT BEARING RESTRICTIONS No ? ?FALLS:  ?Has patient fallen in last 6 months? No ? ?LIVING ENVIRONMENT: ?Lives with: lives with their family ?Lives in: House/apartment ?Stairs: No ?Has following equipment at home: None ? ?OCCUPATION: disability  ? ?PLOF: Independent with basic ADLs ? ?PATIENT GOALS   less pain  ? ? ?OBJECTIVE: (objective measures completed at initial evaluation unless otherwise dated) ? ? ?DIAGNOSTIC FINDINGS:  ?None  ? ?COGNITION: ? Overall cognitive status: Within functional limits for tasks assessed   ?  ? ?MUSCLE LENGTH: ?Hamstrings:3/31: Right 170 deg; Left 170 deg ? ?POSTURE:  ?No issues seen ? ? ? ?LUMBAR ROM:  ? ?Active  A/PROM  ?10/14/2021  ?Flexion WNLreps no change   ?Extension WNL; reps no change    ?Right lateral flexion WNL  ?Left lateral flexion WNL  ?Right rotation WNL  ?Left rotation WNL  ? (Blank rows = not tested) ? ?LE MMT: ? ?MMT Right ?10/14/21 Right ?11/10/21 Left ?10/14/21 Left ?11/10/21  ?Hip flexion 5/5  5/5   ?Hip extension 4/5 4+/5 4/5 4+/5  ?Hip abduction 5/5  5/5   ?Hip adduction      ?Hip internal rotation      ?  Hip external rotation      ?Knee flexion 5/5  5/5   ?Knee extension 5/5  5/5    ?Ankle dorsiflexion 5/5  5/5   ? ? ? ?FUNCTIONAL TESTS:  ?30 seconds chair stand test- 12x on 3/31, 14X on 4/27 ;  Single leg stance:  RT: 4" LT: 60" 3/31,  Rt: 10" 4/27  ? ?GAIT: ?2MWT: Distance walked: 452 at evaluation ?Assistive device utilized: None ?Level of assistance: Complete Independence ? ? ? ?TODAY'S TREATMENT  ?11/10/21 ?Re-evaluation/progress note, see above measures ?Abdominal bracing with correct breathing technique 10X5" ?SLR with abdominal set and correct breathing techinique 10X5" ? ? ?11/04/21 ?Supine: abdominal bracing with posterior pelvic tilt x 2' ? SLR with ab set 10x each side ? Hamstring stretch with hands behind 2x 30" ? SKC 3 x 30" each ? Bridge x 10 ? ?Seated: ? Cervical retraction x 10 ?  Wback 10x5" ? Seated RTB 2 x 10 ? STS 10 from mat table no UE assist ? ?11/02/21: ?Supine: abdominal bracing with posterior pelvic tilt x 2' ? SLR with ab set 10x ? Hamstring stretch with hands behind 2x 30" ? ?Quadruped: UE/LE 10x 5" ? ?Seated:  Wback 10x5" ? RTB row 15x5" ? Chin tuck 10 x 5" ? STS 10x no HHA ? ? ?10/25/21: ?  Supine: Log rolling ?  Diaphragmatic breathing 2x 1' ?  Abdominal sets 3-5" holds for 2 minutes ?  Posterior pelvic rotation ?  Bent knee raise 10x 5" with ab set cueing to breath ?  Bridge 25x ?  SLR 10x with ab set ?  Cervical retraction 10x 3" ? ?  Prone: SLR 10x ? ?  Seated: posture ?  Chin tucks 10x 3"  ?   ?   ?PATIENT EDUCATION:  ?Education details: 4/27: test measures, goal review, breathing with exercises  eval: HEP ?Person educated: Patient ?Education method: Explanation, demonstration ?Education comprehension: verbalized understanding, demonstrated correctly ? ? ?HOME EXERCISE PROGRAM: ?HEP: Eval:Knee to chest, hamstring stretch:  B 2 x20" ?Abdominal bracing, bridge x 10  ? ?10/20/21:  Bent knee raise with ab set ? ?11/02/21: Quadruped UE/LE and STS ? ? ? ?ASSESSMENT: ? ?CLINICAL IMPRESSION: ?Progress note completed for re-certification.  Pt has completed 4 weeks of  therapy focusing on lower back pain and improving core stability . Pt has met 1 goal and is progressing towards all others.  Deficits remaining include balance, pain and glute/extensor weakness. Remainder of session focuse

## 2021-11-14 NOTE — Addendum Note (Signed)
Addended byKarn Cassis S on: 11/14/2021 04:16 PM ? ? Modules accepted: Orders ? ?

## 2021-12-01 NOTE — Progress Notes (Deleted)
Referring Provider: Fayrene Helper, MD Primary Care Physician:  Fayrene Helper, MD Primary GI Physician: Dr. Abbey Chatters  No chief complaint on file.   HPI:   Shirley Bush is a 66 y.o. female with history of constipation with associated RLQ abdominal pain that improves with BMs. Softgel medications are known triggers of constipation.  TCS on 01/14/19 with one 3 mm tubular adenoma in the mid ascending colon, moderate diverticulosis in entire colon, external and internal hemorrhoids, tortuous left colon. Recommended repeat in 5 years.  Celiac screen and alpha gal negative last year, pursued due to intermittent rash and flatulence/concern about food allergy.  She was later seen by an allergist with entire food and environmental panel negative. Suspected lactose intolerance with increased flatulence improved with lactacid tablets. She is presenting today for follow-up.   Last seen in our office 09/23/2020.  Constipation improved with eating more fruits.  Having good productive bowel movements daily to every other day.  Prior abdominal pain resolved.  Bloating/gas improved quite a bit with Lactaid tablets.  Intermittent indigestion well controlled with Pepcid as needed a couple times a month.  She continued to have trouble with intermittent rash/bumps on her scalp, arms, legs, and perineal area.  Also reported her hair was breaking off.  She had upcoming appointment with allergist in June.  Reported her PCP had previously prescribed Flagyl and her rash resolved.  Had first shingles shot in November.  Also reported history of genital herpes when she was 20.  She was to continue her current medications and follow-up with PCP and allergist for further evaluation of her rash.  Recommended 34-monthfollow-up.  She had follow-up with allergist who [place] and questions about alopecia. Also recommended scheduling appointment with  gynecology to discuss rash in the vaginal area due to history of genital warts.  Today:   Past Medical History:  Diagnosis Date   Allergy Don't remember   Don't remember   Anxiety    Arthritis In 2007   In both of my shoulders and my knees   Depression 2002   Depression    Phreesia 06/28/2020   Depression    Phreesia 07/23/2020   Fibromyalgia    Heart murmur Don't remember   I was in 20s   Hypertension 2004   Medial meniscus tear 04/2013   left   Migraines     Past Surgical History:  Procedure [place]ality Date   [place]    Phreesia 06/28/2020   COLONOSCOPY  02/05/2009   COLONOSCOPY N/A 03/06/2014   Procedure: COLONOSCOPY;  Surgeon: SDanie Binder MD;  [facility];  Service: Endoscopy;  Laterality: N/A;  10:30 AM   COLONOSCOPY WITH PROPOFOL N/A 01/14/2019   Surgeon: FDanie Binder MD;  one 3 mm tubular adenoma in the mid ascending colon, moderate diverticulosis in entire colon, external and internal hemorrhoids, tortuous left colon. Recommended repeat in 5 years.    FINGER SURGERY Right    long finger   HERNIA REPAIR N/A    Phreesia 06/28/2020   JOINT REPLACEMENT N/A    Phreesia 06/28/2020   KNEE ARTHROSCOPY WITH MEDIAL MENISECTOMY Left 04/24/2013   Procedure: LEFT KNEE ARTHROSCOPY CHONDROPLASTY WITH MEDIAL MENISECTOMY;  Surgeon: DNinetta Lights MD;  [place]  Service: Orthopedics;  Laterality: Left;  SHOULDER ARTHROSCOPY Left 2009   SHOULDER ARTHROSCOPY Right    SHOULDER ARTHROSCOPY WITH ROTATOR CUFF REPAIR Left 11/25/2004   TOTAL KNEE ARTHROPLASTY Left 11/11/2014   Procedure: LEFT TOTAL KNEE ARTHROPLASTY;  Surgeon: Kathryne Hitch, MD;  Location: [place];  Service: Orthopedics;  Laterality: Left;   TUBAL LIGATION  8546   UMBILICAL HERNIA REPAIR  2008    Current Outpatient Medications  Medication Sig Dispense Refill   amLODipine  (NORVASC) 5 MG tablet Take 1 tablet (5 mg total) by mouth daily. 30 tablet 3   fluticasone (FLONASE) 50 MCG/ACT nasal spray Place 2 sprays into both nostrils daily. 16 g 6   hydrALAZINE (APRESOLINE) 25 MG tablet Take one tablet by mouth two times daily at 9am and 9 pm 60 tablet 5   Misc Natural Products (AIRBORNE ELDERBERRY) CHEW Chew 1 each by mouth daily.     Multiple Vitamins-Minerals (HAIR SKIN AND NAILS FORMULA) TABS Take 3 tablets by mouth daily.     PARoxetine (PAXIL) 20 MG tablet Take 1 tablet (20 mg total) by mouth daily. 30 tablet 3   Specialty Vitamins Products (COLLAGEN ULTRA) CAPS Take 1 each by mouth daily.     Zinc Sulfate (ZINC 15 PO) Take by mouth. Daily     No current facility-administered medications for this visit.    Allergies as of 12/02/2021 - Review Complete 11/02/2021  Allergen Reaction Noted   Lisinopril Cough 06/26/2018   Maxzide [hydrochlorothiazide w-triamterene] Itching 07/16/2019   Other Rash 06/28/2020   Buspar [buspirone]  07/16/2019   Beano meltaways [alpha-d-galactosidase] Rash 09/23/2020   Crestor [rosuvastatin calcium] Rash 05/01/2019   Paxil [paroxetine] Other (See Comments) 12/07/2020   Simethicone Rash 09/23/2020    Family History  Problem Relation Age of Onset   Hypertension Mother    Hypertension Father    Alcohol abuse Father    Seizures Father    Migraines Sister    Thyroid disease Sister    Colon cancer Sister        diagnosed in late 32s.    Thyroid disease Sister    Cancer Sister        breast    Cancer Maternal Aunt        gyne cancer   Cancer Sister     Social History   Socioeconomic History   Marital status: Divorced    Spouse name: Not on file   Number of children: 3   Years of education: 12 grade    Highest education level: 12th grade  Occupational History   Occupation: disability   Tobacco Use   Smoking status: Never   Smokeless tobacco: Never  Vaping Use   Vaping Use: Never used  Substance and Sexual  Activity   Alcohol use: Not Currently    Comment: occasionally- rarely.    Drug use: No   Sexual activity: Not Currently    Birth control/protection: Abstinence, None  Other Topics Concern   Not on file  Social History Narrative   Son is living with her at the moment, patient states that she is isolating in her home and is depressed more lately    Social Determinants of Health   Financial Resource Strain: Low Risk    Difficulty of Paying Living Expenses: Not hard at all  Food Insecurity: Not on file  Transportation Needs: Not on file  Physical Activity: Not on file  Stress: Stress Concern Present   Feeling of Stress : To some extent  Social Connections: Not on file  Review of Systems: Gen: Denies fever, chills, cold or flulike symptoms, presyncope, syncope. CV: Denies chest pain, palpitations. Resp: Denies dyspnea, cough. GI: See HPI Heme: See HPI  Physical Exam: There were no vitals taken for this visit. General:   Alert and oriented. No distress noted. Pleasant and cooperative.  Head:  Normocephalic and atraumatic. Eyes:  Conjuctiva clear without scleral icterus. Heart:  S1, S2 present without murmurs appreciated. Lungs:  Clear to auscultation bilaterally. No wheezes, rales, or rhonchi. No distress.  Abdomen:  +BS, soft, non-tender and non-distended. No rebound or guarding. No HSM or masses noted. Msk:  Symmetrical without gross deformities. Normal posture. Extremities:  Without edema. Neurologic:  Alert and  oriented x4 Psych:  Normal mood and affect.    Assessment:  67 year old female with history of constipation with associated RLQ abdominal pain improved with a bowel movement, adenomatous colon polyps due for surveillance in 2025, external and internal hemorrhoids, suspected lactose intolerance with increased flatulence improved with lactacid tablets, presenting today for routine follow-up.   Constipation:     Plan:  ***   Aliene Altes,  PA-C [facility] 12/02/2021

## 2021-12-02 ENCOUNTER — Ambulatory Visit: Payer: Medicare Other | Admitting: Gastroenterology

## 2021-12-02 ENCOUNTER — Encounter: Payer: Self-pay | Admitting: Internal Medicine

## 2021-12-15 ENCOUNTER — Encounter: Payer: Self-pay | Admitting: Cardiovascular Disease

## 2021-12-23 ENCOUNTER — Encounter: Payer: Self-pay | Admitting: Family Medicine

## 2021-12-23 ENCOUNTER — Ambulatory Visit (INDEPENDENT_AMBULATORY_CARE_PROVIDER_SITE_OTHER): Payer: Medicare Other | Admitting: Family Medicine

## 2021-12-23 VITALS — BP 172/94 | HR 95 | Ht 63.0 in | Wt 160.1 lb

## 2021-12-23 DIAGNOSIS — I10 Essential (primary) hypertension: Secondary | ICD-10-CM

## 2021-12-23 DIAGNOSIS — R7989 Other specified abnormal findings of blood chemistry: Secondary | ICD-10-CM | POA: Diagnosis not present

## 2021-12-23 DIAGNOSIS — E782 Mixed hyperlipidemia: Secondary | ICD-10-CM | POA: Diagnosis not present

## 2021-12-23 DIAGNOSIS — E559 Vitamin D deficiency, unspecified: Secondary | ICD-10-CM

## 2021-12-23 MED ORDER — HYDROCHLOROTHIAZIDE 12.5 MG PO CAPS: 12.5000 mg | ORAL_CAPSULE | Freq: Every day | ORAL | 1 refills | Status: DC

## 2021-12-23 MED ORDER — AMLODIPINE BESYLATE 10 MG PO TABS: 10.0000 mg | ORAL_TABLET | Freq: Every day | ORAL | 2 refills | Status: DC

## 2021-12-23 MED ORDER — POTASSIUM CHLORIDE CRYS ER 10 MEQ PO TBCR: 10.0000 meq | EXTENDED_RELEASE_TABLET | Freq: Every day | ORAL | 1 refills | Status: DC

## 2021-12-23 NOTE — Patient Instructions (Addendum)
F/U , to re eval bP in 4 to 5 weeks, call if you need me sooner   Fasting cBC, lipid,TSH, vit D , HBA1C,  cmp and EGFR 5 days before next appointment  New additional medication is microzide 12.5 mg and potassium 10 meq daily, and dose of amlodipine is increased to 10 mg daily  Nurse pls obtain shingrix vac info from [employer] and enter  Blood pressure still needs to be controlled  Shirley Bush, Please send a message back to advanced hypertension clinic to reschedule at soonest available appointment   Thanks for choosing [facility], we consider it a privelige to serve you.

## 2021-12-27 ENCOUNTER — Encounter: Payer: Self-pay | Admitting: Family Medicine

## 2021-12-27 NOTE — Assessment & Plan Note (Signed)
Uncontrolled, increase amlodipine to 10 mg daily and add HCTZ 12.5 mg daily DASH diet and commitment to daily physical activity for a minimum of 30 minutes discussed and encouraged, as a part of hypertension management. The importance of attaining a healthy weight is also discussed.     12/23/2021    2:06 PM 12/23/2021    1:44 PM 09/16/2021    2:04 PM 09/16/2021    1:28 PM 08/11/2021   11:25 AM 08/11/2021   11:07 AM 06/10/2021   10:20 AM  BP/Weight  Systolic BP 332 951 884 166 063 016 010  Diastolic BP 94 92 932 355 104 110 94  Wt. (Lbs)  160.12  158.12  157.12   BMI  28.36 kg/m2  28.01 kg/m2  27.83 kg/m2

## 2021-12-27 NOTE — Progress Notes (Signed)
   Shirley Bush     MRN: [id number]      DOB: [birth year]   HPI [name] is here for follow up and re-evaluation of chronic medical conditions, medication management and review of any available recent lab and radiology data.  Preventive health is updated, specifically  Cancer screening and Immunization.   Questions or concerns regarding consultations or procedures which the PT has had in the interim are  addressed. The PT denies any adverse reactions to current medications since the last visit.  There are no new concerns.  There are no specific complaints  Needs to reschedule appt with  advanced HTN clinic States ready for BP to be controlled  ROS Denies recent fever or chills. Denies sinus pressure, nasal congestion, ear pain or sore throat. Denies chest congestion, productive cough or wheezing. Denies chest pains, palpitations and leg swelling Denies abdominal pain, nausea, vomiting,diarrhea or constipation.   Denies dysuria, frequency, hesitancy or incontinence. Denies joint pain, swelling and limitation in mobility. Denies headaches, seizures, numbness, or tingling. Denies uncontrolled depression, anxiety or insomnia. Denies skin break down or rash.   PE  BP (!) 172/94   Pulse 95   Ht '5\' 3"'$  (1.6 m)   Wt 160 lb 1.9 oz (72.6 kg)   SpO2 96%   BMI 28.36 kg/m   Patient alert and oriented and in no cardiopulmonary distress.  HEENT: No facial asymmetry, EOMI,     Neck supple .  Chest: Clear to auscultation bilaterally.  CVS: S1, S2 no murmurs, no S3.Regular rate.  ABD: Soft non tender.   Ext: No edema  MS: Adequate ROM spine, shoulders, hips and knees.  Skin: Intact, no ulcerations or rash noted.  Psych: Good eye contact, normal affect. Memory intact not anxious or depressed appearing.  CNS: CN 2-12 intact, power,  normal throughout.no focal deficits noted.   Assessment & Plan  Malignant hypertension Uncontrolled, increase amlodipine to 10 mg daily and add  HCTZ 12.5 mg daily DASH diet and commitment to daily physical activity for a minimum of 30 minutes discussed and encouraged, as a part of hypertension management. The importance of attaining a healthy weight is also discussed.     12/23/2021    2:06 PM 12/23/2021    1:44 PM 09/16/2021    2:04 PM 09/16/2021    1:28 PM 08/11/2021   11:25 AM 08/11/2021   11:07 AM 06/10/2021   10:20 AM  BP/Weight  Systolic BP 867 619 509 326 712 458 099  Diastolic BP 94 92 833 825 104 110 94  Wt. (Lbs)  160.12  158.12  157.12   BMI  28.36 kg/m2  28.01 kg/m2  27.83 kg/m2

## 2022-01-23 ENCOUNTER — Encounter (HOSPITAL_BASED_OUTPATIENT_CLINIC_OR_DEPARTMENT_OTHER): Payer: Self-pay | Admitting: Cardiovascular Disease

## 2022-01-23 ENCOUNTER — Encounter (HOSPITAL_BASED_OUTPATIENT_CLINIC_OR_DEPARTMENT_OTHER): Payer: Self-pay

## 2022-01-23 ENCOUNTER — Ambulatory Visit (HOSPITAL_BASED_OUTPATIENT_CLINIC_OR_DEPARTMENT_OTHER): Payer: Medicare Other | Admitting: Cardiovascular Disease

## 2022-01-23 VITALS — BP 148/94 | HR 100 | Ht 63.0 in | Wt 158.1 lb

## 2022-01-23 DIAGNOSIS — I10 Essential (primary) hypertension: Secondary | ICD-10-CM | POA: Diagnosis not present

## 2022-01-23 DIAGNOSIS — Z006 Encounter for examination for normal comparison and control in clinical research program: Secondary | ICD-10-CM

## 2022-01-23 DIAGNOSIS — K219 Gastro-esophageal reflux disease without esophagitis: Secondary | ICD-10-CM | POA: Diagnosis not present

## 2022-01-23 DIAGNOSIS — Z5181 Encounter for therapeutic drug level monitoring: Secondary | ICD-10-CM

## 2022-01-23 HISTORY — DX: Gastro-esophageal reflux disease without esophagitis: K21.9

## 2022-01-23 MED ORDER — SPIRONOLACTONE 25 MG PO TABS: 25.0000 mg | ORAL_TABLET | Freq: Every day | ORAL | 3 refills | Status: DC

## 2022-01-23 NOTE — Assessment & Plan Note (Signed)
Reports chest pain when lying down or after eating. Likely related to GERD.

## 2022-01-23 NOTE — Research (Signed)
  Subject Name: Shirley Bush met inclusion and exclusion criteria for the Virtual Care and Social Determinant Interventions for the management of hypertension trial.  The informed consent form, study requirements and expectations were reviewed with the subject by Dr. Oval Linsey and myself. The subject was given the opportunity to read the consent and ask questions. The subject verbalized understanding of the trial requirements.  All questions were addressed prior to the signing of the consent form. The subject agreed to participate in the trial and signed the informed consent. The informed consent was obtained prior to performance of any protocol-specific procedures for the subject.  A copy of the signed informed consent was given to the subject and a copy was placed in the subject's medical record.  Shirley Bush was randomized to Group 1.

## 2022-01-23 NOTE — Progress Notes (Signed)
Spoke with patient at the end of her visit with Dr. Oval Linsey regarding stress management per the provider's referral. Briefly discussed patient's current stressors and her coping strategies. Also recommended counseling as an additional option due to the nature of her stress and sleep disturbance. Patient is interested in resuming walking as a stress management technique and will work on starting before our initial health coaching session on 7/13 @ 1:30pm over the phone. Patient also tries to practice deep breathing at times. Patient spends time alone in her bedroom as a way to decompress. Patient also has been recommended to adopt the DASH diet due to hypertension and will review the handout to determine what changes she wants to start with that she needs support and accountability for. Patient will decide prior to our session if she wants to start counseling before a referral is submitted.    Lucan Riner Truman Hayward, [facility] Guide, Health Coach [address] [place] [address] Telephone: [phone] Email: [email or web address]

## 2022-01-23 NOTE — Progress Notes (Signed)
[facility]pert[facility] Initial Assessment:    Date:  01/23/2022   ID:  Shirley Bush, DOB June 24, 1956, MRN [id number]  PCP:  Fayrene Helper, MD  Cardiologist:  None  Nephrologist:  Referring MD: Fayrene Helper, MD   CC: Hypertension  History of Present Illness:    Shirley Bush is a 66 y.o. female with a hx of hypertension, hyperlipidemia, and anxiety, here to establish care in the [facility]. She saw her PCP 12/2021 and her blood pressure was 172/94. Her blood pressure has been difficult to manage for years. At that appointment, amlodipine was increased to 10 mg and HCTZ 12.5 mg was added. She was encouraged to exercise and follow the DASH diet. She has struggled with medication intolerance. She previously saw Dr. Johnsie Cancel for chest pain. Her symptoms were thought to be related to uncontrolled hypertension and possibly indigestion. She had a nuclear stress test 02/2021 that was negative for ischemia. She only achieved 4.6 METS on a Bruce protocol. Echo at that time revealed LVEF 60-65% with normal diastolic function.   Today, she states she has been struggling with hypertension since her 28's. At that time she notes suffering an injury at work that could have been life-threatening but fortunately was not. At home her blood pressure has been ranging 150-160/80-90 on average. She notes that previous antihypertensives have caused skin rashes. In the past 5 years, she had another injury at work which required a knee replacement. Since then her depression and anxiety have also worsened. She has tried to see therapists previously with limited success. Also she confirms some LE Edema, with swelling up to her mid-shin at worst. She endorses fibromyalgia and alopecia. She admits that she is able to exercise but is not mentally ready to follow a regular routine. Regarding her diet she usually cooks her meals at home. She tries to monitor her salt intake, but admits to  adding some salt to her meals. Normally she will drink black coffee, maybe 2-3 times a week. Typically she will take a caffeine tablet because she struggles to fall asleep and struggles to wake up. The other night her granddaughter told her she was snoring. Sometimes she experiences chest pain that wakes her up at night. She also notes that she easily develops congestion if she sleeps in cold air at night. She tries to avoid taking ibuprofen as it causes constipation, but it is more effective than tylenol. She endorses occasional headaches which she treats with Excedrin for migraines. She does not smoke. She denies any palpitations, shortness of breath, lightheadedness, syncope, orthopnea, or PND.  Previous antihypertensives: Lisinopril Maxide Amlodipine - swelling Hydralazine HCTZ - rash   Past Medical History:  Diagnosis Date   Allergy Don't remember   Don't remember   Anxiety    Arthritis In 2007   In both of my shoulders and my knees   Depression 2002   Depression    Phreesia 06/28/2020   Depression    Phreesia 07/23/2020   Fibromyalgia    GERD (gastroesophageal reflux disease) 01/23/2022   Heart murmur Don't remember   I was in 20s   Hypertension 2004   Medial meniscus tear 04/2013   left   Migraines     Past Surgical History:  Procedure Laterality Date   [place] N/A    Phreesia 06/28/2020   COLONOSCOPY  02/05/2009  COLONOSCOPY N/A 03/06/2014   Procedure: COLONOSCOPY;  Surgeon: Danie Binder, MD;  [facility];  Service: Endoscopy;  Laterality: N/A;  10:30 AM   COLONOSCOPY WITH PROPOFOL N/A 01/14/2019   Surgeon: Danie Binder, MD;  one 3 mm tubular adenoma in the mid ascending colon, moderate diverticulosis in entire colon, external and internal hemorrhoids, tortuous left colon. Recommended repeat in 5 years.    FINGER SURGERY Right    long finger   HERNIA REPAIR N/A     Phreesia 06/28/2020   JOINT REPLACEMENT N/A    Phreesia 06/28/2020   KNEE ARTHROSCOPY WITH MEDIAL MENISECTOMY Left 04/24/2013   Procedure: LEFT KNEE ARTHROSCOPY CHONDROPLASTY WITH MEDIAL MENISECTOMY;  Surgeon: Ninetta Lights, MD;  Location: [place];  Service: Orthopedics;  Laterality: Left;   SHOULDER ARTHROSCOPY Left 2009   SHOULDER ARTHROSCOPY Right    SHOULDER ARTHROSCOPY WITH ROTATOR CUFF REPAIR Left 11/25/2004   TOTAL KNEE ARTHROPLASTY Left 11/11/2014   Procedure: LEFT TOTAL KNEE ARTHROPLASTY;  Surgeon: Kathryne Hitch, MD;  Location: [place];  Service: Orthopedics;  Laterality: Left;   TUBAL LIGATION  2505   UMBILICAL HERNIA REPAIR  2008    Current Medications: Current Meds  Medication Sig   amLODipine (NORVASC) 10 MG tablet Take 1 tablet (10 mg total) by mouth daily.   diphenhydrAMINE (BENADRYL) 25 mg capsule Take 25 mg by mouth every 6 (six) hours as needed.   ibuprofen (ADVIL) 200 MG tablet Take 200 mg by mouth every 6 (six) hours as needed.   Misc Natural Products (AIRBORNE ELDERBERRY) CHEW Chew 1 each by mouth daily.   Multiple Vitamins-Minerals (HAIR SKIN AND NAILS FORMULA) TABS Take 3 tablets by mouth daily.   PARoxetine (PAXIL) 20 MG tablet Take 1 tablet (20 mg total) by mouth daily.   Specialty Vitamins Products (COLLAGEN ULTRA) CAPS Take 1 each by mouth daily.   spironolactone (ALDACTONE) 25 MG tablet Take 1 tablet (25 mg total) by mouth daily.   Zinc Sulfate (ZINC 15 PO) Take by mouth. Daily   [DISCONTINUED] potassium chloride (KLOR-CON M) 10 MEQ tablet Take 1 tablet (10 mEq total) by mouth daily.     Allergies:   Lisinopril, Maxzide [hydrochlorothiazide w-triamterene], Other, Buspar [buspirone], Beano meltaways [alpha-d-galactosidase], Crestor [rosuvastatin calcium], Hydrochlorothiazide, Paxil [paroxetine], and Simethicone   Social History   Socioeconomic History   Marital status: Divorced    Spouse name: Not on file   Number of children: 3    Years of education: 12 grade    Highest education level: 12th grade  Occupational History   Occupation: disability   Tobacco Use   Smoking status: Never   Smokeless tobacco: Never  Vaping Use   Vaping Use: Never used  Substance and Sexual Activity   Alcohol use: Not Currently    Comment: occasionally- rarely.    Drug use: No   Sexual activity: Not Currently    Birth control/protection: Abstinence, None  Other Topics Concern   Not on file  Social History Narrative   Son is living with her at the moment, patient states that she is isolating in her home and is depressed more lately    Social Determinants of Health   Financial Resource Strain: Low Risk  (09/23/2021)   Overall Financial Resource Strain (CARDIA)    Difficulty of Paying Living Expenses: Not hard at all  Food Insecurity: No Food Insecurity (07/22/2018)   Hunger Vital Sign    Worried About Running Out of Food in the Last  Year: Never true    [place] in the Last Year: Never true  Transportation Needs: No Transportation Needs (07/26/2020)   PRAPARE - Hydrologist (Medical): No    Lack of Transportation (Non-Medical): No  Physical Activity: Inactive (07/26/2020)   Exercise Vital Sign    Days of Exercise per Week: 0 days    Minutes of Exercise per Session: 0 min  Stress: Stress Concern Present (09/23/2021)   [place]    Feeling of Stress : To some extent  Social Connections: Socially Isolated (07/26/2020)   Social Connection and Isolation Panel [NHANES]    Frequency of Communication with Friends and Family: Twice a week    Frequency of Social Gatherings with Friends and Family: Once a week    Attends Religious Services: Never    Marine scientist or Organizations: No    Attends Archivist Meetings: Never    Marital Status: Widowed     Family History: The patient's family history includes Alcohol abuse  in her father; Cancer in her maternal aunt, sister, and sister; Colon cancer in her sister; Heart attack in her maternal grandmother; Hypertension in her father, mother, and sister; Migraines in her sister; Seizures in her father; Thyroid disease in her sister and sister.  ROS:   Please see the history of present illness.    (+) Depression/Anxiety (+) Stress (+) Bilateral LE edema (+) Insomnia (+) Daytime somnolence (+) Nocturnal chest pain (+) Occasional headaches All other systems reviewed and are negative.  EKGs/Labs/Other Studies Reviewed:    Echocardiogram  02/16/2021:  1. Left ventricular ejection fraction, by estimation, is 60 to 65%. The  left ventricle has normal function. The left ventricle has no regional  wall motion abnormalities. Left ventricular diastolic parameters were  normal.   2. Right ventricular systolic function is normal. The right ventricular  size is normal. Tricuspid regurgitation signal is inadequate for assessing  PA pressure.   3. There is a trivial pericardial effusion posterior to the left  ventricle.   4. The mitral valve is grossly normal. Mild mitral valve regurgitation.   5. The aortic valve is tricuspid. Aortic valve regurgitation is trivial.  Aortic valve mean gradient measures 6.0 mmHg. No aortic stenosis.   6. The inferior vena cava is normal in size with greater than 50%  respiratory variability, suggesting right atrial pressure of 3 mmHg.   Comparison(s): No prior Echocardiogram.   Nuclear Stress Myoview  02/16/2021: Patient exercised for 3 minutes achieving a maximum workload of 4.6 METS and MPHR 95%. No chest pain reported, exercise was limited by knee pain. There were no diagnostic ST segment changes to indicate ischemia. No significant arrhythmias. Calculated Duke treadmill score of 3 is intermediate risk, mainly based on exercise time alone. Blood pressure demonstrated a normal response to exercise. No significant myocardial perfusion  defects to indicate scar or ischemia. This is a low risk study based on normal perfusion. Nuclear stress EF: 74%.   EKG:  EKG is personally reviewed. 01/23/2022: Sinus rhythm. Rate 100 bpm.  Recent Labs: 01/24/2021: ALT 24; BUN 7; Creatinine, Ser 0.81; Hemoglobin 13.5; Platelets 398; Potassium 4.5; Sodium 141; TSH 3.930   Recent Lipid Panel    Component Value Date/Time   CHOL 221 (H) 01/24/2021 0916   TRIG 119 01/24/2021 0916   HDL 58 01/24/2021 0916   CHOLHDL 3.8 01/24/2021 0916   CHOLHDL 4.4 10/03/2019 0809  VLDL 20 10/07/2015 0808   LDLCALC 142 (H) 01/24/2021 0916   LDLCALC 148 (H) 10/03/2019 0809    Physical Exam:    VS:  BP (!) 148/94 (BP Location: Right Arm, Patient Position: Sitting, Cuff Size: Normal)   Pulse 100   Ht '5\' 3"'$  (1.6 m)   Wt 158 lb 1.6 oz (71.7 kg)   BMI 28.01 kg/m  , BMI Body mass index is 28.01 kg/m. GENERAL:  Anxious appearing.  Tearful at times.  HEENT: Pupils equal round and reactive, fundi not visualized, oral mucosa unremarkable NECK:  No jugular venous distention, waveform within normal limits, carotid upstroke brisk and symmetric, no bruits, no thyromegaly LUNGS:  Clear to auscultation bilaterally HEART:  RRR.  PMI not displaced or sustained,S1 and S2 within normal limits, no S3, no S4, no clicks, no rubs, no murmurs ABD:  Flat, positive bowel sounds normal in frequency in pitch, no bruits, no rebound, no guarding, no midline pulsatile mass, no hepatomegaly, no splenomegaly EXT:  2 plus pulses throughout, no edema, no cyanosis no clubbing SKIN:  No rashes no nodules NEURO:  Cranial nerves II through XII grossly intact, motor grossly intact throughout PSYCH:  Cognitively intact, oriented to person place and time   ASSESSMENT/PLAN:    GERD (gastroesophageal reflux disease) Reports chest pain when lying down or after eating. Likely related to GERD.   Essential hypertension Blood pressure is poorly controlled both here and at home.  She is  quite anxious today.  I suspect that her anxiety is certainly contributing.  She also has a very high caffeine intake.  Recommended that she limit her caffeine to the equivalent of 1 coffee daily.  She will work on limiting her sodium intake.  We also discussed increasing her exercise.  We have referred her to the PREP exercise program at the [facility] and recommend at least 150 minutes of exercise weekly.  She has intolerance to several medications.  Continue amlodipine.  We will add spironolactone 25 mg daily.  Stop her potassium supplement.  Check a BMP in a week.  She consents to participating in our remote patient monitoring system and monitoring through the [place] remote patient monitoring system.  We will also have her talk to her care guide for stress management.   Screening for Secondary Hypertension:     01/23/2022    1:52 PM  Causes  Drugs/Herbals Screened     - Comments Limits sodium intake.  Caffeine tablet and coffee.  Some NSAIDS  Sleep Apnea Screened     - Comments snores but no apnea  Thyroid Disease Screened  Hyperaldosteronism N/A  Pheochromocytoma N/A  Cushing's Syndrome N/A  Hyperparathyroidism N/A  Coarctation of the Aorta Screened     - Comments BP symmetric  Compliance Screened    Relevant Labs/Studies:    Latest Ref Rng & Units 01/24/2021    9:16 AM 07/01/2020   11:21 AM 11/06/2019    9:24 AM  Basic Labs  Sodium 134 - 144 mmol/L 141  141  138   Potassium 3.5 - 5.2 mmol/L 4.5  4.2  4.5   Creatinine 0.57 - 1.00 mg/dL 0.81  0.85  0.80        Latest Ref Rng & Units 01/24/2021    9:16 AM 07/01/2020   11:21 AM  Thyroid   TSH 0.450 - 4.500 uIU/mL 3.930  3.000                  Disposition:    FU  with APP/PharmD in 1 month for the next 3 months.   FU with Kirra Verga C. Oval Linsey, MD, [facility] in 4 months.   Medication Adjustments/Labs and Tests Ordered: Current medicines are reviewed at length with the patient today.  Concerns regarding medicines are outlined above.    Orders Placed This Encounter  Procedures   Basic Metabolic Panel (BMET)   Amb Referral To Provider Referral Exercise Program (P.R.E.P)   Cantril's Ladder Assessment   EKG 12-Lead   Meds ordered this encounter  Medications   spironolactone (ALDACTONE) 25 MG tablet    Sig: Take 1 tablet (25 mg total) by mouth daily.    Dispense:  90 tablet    Refill:  3    I,Mathew Stumpf,acting as a scribe for Skeet Latch, MD.,have documented all relevant documentation on the behalf of Skeet Latch, MD,as directed by  Skeet Latch, MD while in the presence of Skeet Latch, MD.  I, Cleona Oval Linsey, MD have reviewed all documentation for this visit.  The documentation of the exam, diagnosis, procedures, and orders on 01/23/2022 are all accurate and complete.   Signed, Skeet Latch, MD  01/23/2022 4:35 PM    [place]

## 2022-01-23 NOTE — Patient Instructions (Addendum)
Medication Instructions:  START SPIRONOLACTONE 25 MG DAILY    Labwork: BMET IN 1 WEEK    Testing/Procedures: NONE   Follow-Up: 02/23/2022 1:30 pm with Pharm D at the [facility] office    You will receive a phone call from the PREP exercise and nutrition program to schedule an initial assessment.   Special Instructions:   MONITOR YOUR BLOOD PRESSURE AT HOME WITH MACHINE PROVIDE TWICE A DAY  LOG IN BOOK AND BRING TO FOLLOW UP    DASH Eating Plan DASH stands for "Dietary Approaches to Stop Hypertension." The DASH eating plan is a healthy eating plan that has been shown to reduce high blood pressure (hypertension). It may also reduce your risk for type 2 diabetes, heart disease, and stroke. The DASH eating plan may also help with weight loss. What are tips for following this plan?  General guidelines Avoid eating more than 2,300 mg (milligrams) of salt (sodium) a day. If you have hypertension, you may need to reduce your sodium intake to 1,500 mg a day. Limit alcohol intake to no more than 1 drink a day for nonpregnant women and 2 drinks a day for men. One drink equals 12 oz of beer, 5 oz of wine, or 1 oz of hard liquor. Work with your health care provider to maintain a healthy body weight or to lose weight. Ask what an ideal weight is for you. Get at least 30 minutes of exercise that causes your heart to beat faster (aerobic exercise) most days of the week. Activities may include walking, swimming, or biking. Work with your health care provider or diet and nutrition specialist (dietitian) to adjust your eating plan to your individual calorie needs. Reading food labels  Check food labels for the amount of sodium per serving. Choose foods with less than 5 percent of the Daily Value of sodium. Generally, foods with less than 300 mg of sodium per serving fit into this eating plan. To find whole grains, look for the word "whole" as the first word in the ingredient list. Shopping Buy  products labeled as "low-sodium" or "no salt added." Buy fresh foods. Avoid canned foods and premade or frozen meals. Cooking Avoid adding salt when cooking. Use salt-free seasonings or herbs instead of table salt or sea salt. Check with your health care provider or pharmacist before using salt substitutes. Do not fry foods. Cook foods using healthy methods such as baking, boiling, grilling, and broiling instead. Cook with heart-healthy oils, such as olive, canola, soybean, or sunflower oil. Meal planning Eat a balanced diet that includes: 5 or more servings of fruits and vegetables each day. At each meal, try to fill half of your plate with fruits and vegetables. Up to 6-8 servings of whole grains each day. Less than 6 oz of lean meat, poultry, or fish each day. A 3-oz serving of meat is about the same size as a deck of cards. One egg equals 1 oz. 2 servings of low-fat dairy each day. A serving of nuts, seeds, or beans 5 times each week. Heart-healthy fats. Healthy fats called Omega-3 fatty acids are found in foods such as flaxseeds and coldwater fish, like sardines, salmon, and mackerel. Limit how much you eat of the following: Canned or prepackaged foods. Food that is high in trans fat, such as fried foods. Food that is high in saturated fat, such as fatty meat. Sweets, desserts, sugary drinks, and other foods with added sugar. Full-fat dairy products. Do not salt foods before eating. Try to  eat at least 2 vegetarian meals each week. Eat more home-cooked food and less restaurant, buffet, and fast food. When eating at a restaurant, ask that your food be prepared with less salt or no salt, if possible. What foods are recommended? The items listed may not be a complete list. Talk with your dietitian about what dietary choices are best for you. Grains Whole-grain or whole-wheat bread. Whole-grain or whole-wheat pasta. Brown rice. [place]. Bulgur. Whole-grain and low-sodium cereals.  Pita bread. Low-fat, low-sodium crackers. Whole-wheat flour tortillas. Vegetables Fresh or frozen vegetables (raw, steamed, roasted, or grilled). Low-sodium or reduced-sodium tomato and vegetable juice. Low-sodium or reduced-sodium tomato sauce and tomato paste. Low-sodium or reduced-sodium canned vegetables. Fruits All fresh, dried, or frozen fruit. Canned fruit in natural juice (without added sugar). Meat and other protein foods Skinless chicken or [place]. Ground chicken or [place]. Pork with fat trimmed off. Fish and seafood. Egg whites. Dried beans, peas, or lentils. Unsalted nuts, nut butters, and seeds. Unsalted canned beans. Lean cuts of beef with fat trimmed off. Low-sodium, lean deli meat. Dairy Low-fat (1%) or fat-free (skim) milk. Fat-free, low-fat, or reduced-fat cheeses. Nonfat, low-sodium ricotta or cottage cheese. Low-fat or nonfat yogurt. Low-fat, low-sodium cheese. Fats and oils Soft margarine without trans fats. Vegetable oil. Low-fat, reduced-fat, or light mayonnaise and salad dressings (reduced-sodium). Canola, safflower, olive, soybean, and sunflower oils. Avocado. Seasoning and other foods Herbs. Spices. Seasoning mixes without salt. Unsalted popcorn and pretzels. Fat-free sweets. What foods are not recommended? The items listed may not be a complete list. Talk with your dietitian about what dietary choices are best for you. Grains Baked goods made with fat, such as croissants, muffins, or some breads. Dry pasta or rice meal packs. Vegetables Creamed or fried vegetables. Vegetables in a cheese sauce. Regular canned vegetables (not low-sodium or reduced-sodium). Regular canned tomato sauce and paste (not low-sodium or reduced-sodium). Regular tomato and vegetable juice (not low-sodium or reduced-sodium). Angie Fava. Olives. Fruits Canned fruit in a light or heavy syrup. Fried fruit. Fruit in cream or butter sauce. Meat and other protein foods Fatty cuts of meat. Ribs. Fried  meat. Berniece Salines. Sausage. Bologna and other processed lunch meats. Salami. Fatback. Hotdogs. Bratwurst. Salted nuts and seeds. Canned beans with added salt. Canned or smoked fish. Whole eggs or egg yolks. Chicken or [place] with skin. Dairy Whole or 2% milk, cream, and half-and-half. Whole or full-fat cream cheese. Whole-fat or sweetened yogurt. Full-fat cheese. Nondairy creamers. Whipped toppings. Processed cheese and cheese spreads. Fats and oils Butter. Stick margarine. Lard. Shortening. Ghee. Bacon fat. Tropical oils, such as coconut, palm kernel, or palm oil. Seasoning and other foods Salted popcorn and pretzels. Onion salt, garlic salt, seasoned salt, table salt, and sea salt. Worcestershire sauce. Tartar sauce. Barbecue sauce. Teriyaki sauce. Soy sauce, including reduced-sodium. Steak sauce. Canned and packaged gravies. Fish sauce. Oyster sauce. Cocktail sauce. Horseradish that you find on the shelf. Ketchup. Mustard. Meat flavorings and tenderizers. Bouillon cubes. Hot sauce and Tabasco sauce. Premade or packaged marinades. Premade or packaged taco seasonings. Relishes. Regular salad dressings. Where to find more information: National Heart, Lung, and [place]: https://wilson-eaton.com/ American Heart Association: www.heart.org Summary The DASH eating plan is a healthy eating plan that has been shown to reduce high blood pressure (hypertension). It may also reduce your risk for type 2 diabetes, heart disease, and stroke. With the DASH eating plan, you should limit salt (sodium) intake to 2,300 mg a day. If you have hypertension, you may need to reduce  your sodium intake to 1,500 mg a day. When on the DASH eating plan, aim to eat more fresh fruits and vegetables, whole grains, lean proteins, low-fat dairy, and heart-healthy fats. Work with your health care provider or diet and nutrition specialist (dietitian) to adjust your eating plan to your individual calorie needs. This information is not  intended to replace advice given to you by your health care provider. Make sure you discuss any questions you have with your health care provider. Document Released: 06/22/2011 Document Revised: 06/15/2017 Document Reviewed: 06/26/2016 Elsevier Patient Education  2020 [employer].

## 2022-01-23 NOTE — Assessment & Plan Note (Signed)
Blood pressure is poorly controlled both here and at home.  She is quite anxious today.  I suspect that her anxiety is certainly contributing.  She also has a very high caffeine intake.  Recommended that she limit her caffeine to the equivalent of 1 coffee daily.  She will work on limiting her sodium intake.  We also discussed increasing her exercise.  We have referred her to the PREP exercise program at the [facility] and recommend at least 150 minutes of exercise weekly.  She has intolerance to several medications.  Continue amlodipine.  We will add spironolactone 25 mg daily.  Stop her potassium supplement.  Check a BMP in a week.  She consents to participating in our remote patient monitoring system and monitoring through the [place] remote patient monitoring system.  We will also have her talk to her care guide for stress management.

## 2022-01-26 ENCOUNTER — Ambulatory Visit: Payer: Medicare Other

## 2022-01-26 ENCOUNTER — Telehealth: Payer: Self-pay

## 2022-01-26 NOTE — Telephone Encounter (Signed)
Attempted to reach pt reference PREP referral No VM connected to leave message Will attempt at later date

## 2022-01-27 ENCOUNTER — Ambulatory Visit (INDEPENDENT_AMBULATORY_CARE_PROVIDER_SITE_OTHER): Payer: Medicare Other

## 2022-01-27 ENCOUNTER — Ambulatory Visit (HOSPITAL_BASED_OUTPATIENT_CLINIC_OR_DEPARTMENT_OTHER): Payer: Medicare Other | Admitting: Cardiovascular Disease

## 2022-01-27 DIAGNOSIS — Z0000 Encounter for general adult medical examination without abnormal findings: Secondary | ICD-10-CM

## 2022-01-27 NOTE — Progress Notes (Signed)
Appointment Outcome: Completed, Session #: Initial Start time: 3:55pm   End time: 4:45pm   Total Mins: 50 minutes  AGREEMENTS SECTION   Overall Goal(s): Stress management Sleep Hygiene                                                Agreement/Action Steps:  Establish healthy boundaries Practice deep breathing Conduct self-check-ins Implement positive self-talk Engage in daily devotional Take a walk 2-3xs/wk in morning Stretching before bed  Progress Notes:  Patient stated that she feels she needs to put herself first, she is out of routine and doesn't feel like herself. Patient stated that she is there for everyone else, and all her desires are gone. Patient shared that she used to sew as a hobby but doesn't have the wherewithal to resume the craft. Patient shares her living space with her children and grandchildren and doesn't have much personal time alone.   Patient shared that she assists her mother with getting to doctor's appointments and at one time she was cooking for her and sitting with her during the day. Patient eventually had to end those activities and now call to check on her mother frequently. Patient stated that she doesn't get support from other family members to share the responsibility of care of her mother.   Patient shared that she has not felt like herself in quite some time. Patient stated that she takes Prozac in the morning, and without the medication, she gets irritable and mad easily. Patient stated that due to stress, she has trouble sleeping, which causes her to rely on caffeine to get going in the morning. Patient expressed that she really wants to establish a routine. Patient mentioned that she tries to eat breakfast before 11:00am and then she gets sleepy after taking her medications.   Patient shared that she may sleep till 8-9pm and will be up for the remainder of the night and is not able to fall asleep until early morning due to having to use the bathroom  from taking spironolactone. Patient also shared that she has experienced pain in one of her fingers since starting this medication but will try to give it some time to see if her body adjust.   Patient stated that she typically withdraws when she feels overwhelmed and goes into her room to collect herself. Patient stated that when she was younger, she would run away from her problems altogether, but learned that you need to resolve them.  Patient stated that there are times when she does speak her mind to her family. Patient stated that at times she does sit and practice deep breathing for a count of 4 breathing in and out but is not consistent with the practice. Patient stated that her faith is all she knows, which helps her keep pushing through. Patient shared that she uses an app on her phone that has study guides, lesson plans, and questions that she follows.   Patient stated that she used to walk regularly but has gotten out of her old routine. Patient stated that she had the desire to go walking earlier this week but did not go because she wanted to walk alone but could not at the time. Patient discussed that she has thought about including her granddaughter during her walking time or go early in the morning so she could walk alone.  Patient expressed interest in participating in PREP that was mentioned during her visit with Dr. Duke Salvia but wasn't sure what the process is to get into the program.   Coaching Outcomes: Discussed the idea of participating in therapy during first meeting on 7/10 due to the nature of her multiple stressors and inability to sleep well at night. After hearing the patient's detail account of stressors today and not having support, I asked if patient had decided if she wanted to participate in therapy. Patient is not interested in therapy at this time.   Discussed with patient the idea of creating a self-care routine that addressed different needs. Patient agreed with the  idea since she wants to create a routine for herself and the desire to start putting herself first. Patient feels the need to establish more boundaries with others for her to be able to make time for herself.   Discussed with patient the idea of finding another hobby that she is interested in to safely keep her engaged during the day so that she doesn't sleep as much, interrupting her ability to sleep at night. Patient stated that she is not sure that that hobby could be at this time and is sure she is not going to resume sewing.   Discussed with patient the concept of conducting self-check-ins to help her identify how she is feeling in various moments and identifying what she needs to calm herself to reduce the chances of withdrawal. Patient will continue to engage in her devotional that she engages in through bible lessons, study plans, and questions. Suggested that patient practice positive self-talk in addition to this step.   Asked patient what's something she could do that would help her relax before bed that may be helpful with getting better sleep. The idea of stretching was mentioned that the patient stated that she may try. Encouraged patient to talk with providers about the medications that are causing her complications that interrupts her sleep cycle.   Patient plans to start walking 2-3xs/wk in the mornings around [facility], using an umbrella to block the sun. Dr. Duke Salvia has submitted a referral to PREP at the [facility] location and patient was contacted by Viann Fish on 7/13 but was not able to leave a message. Sent Viann Fish a secure email regarding the patient's interest in participating in PREP so she can attempt to contact her again.   Patient was provided information on various deep breathing exercises, how to conduct self-check-ins, implementing positive self-talk, and how to establish healthy boundaries.

## 2022-01-30 ENCOUNTER — Telehealth: Payer: Self-pay

## 2022-01-30 DIAGNOSIS — E559 Vitamin D deficiency, unspecified: Secondary | ICD-10-CM | POA: Diagnosis not present

## 2022-01-30 DIAGNOSIS — R7989 Other specified abnormal findings of blood chemistry: Secondary | ICD-10-CM | POA: Diagnosis not present

## 2022-01-30 DIAGNOSIS — E782 Mixed hyperlipidemia: Secondary | ICD-10-CM | POA: Diagnosis not present

## 2022-01-30 DIAGNOSIS — Z0000 Encounter for general adult medical examination without abnormal findings: Secondary | ICD-10-CM

## 2022-01-30 DIAGNOSIS — R7301 Impaired fasting glucose: Secondary | ICD-10-CM | POA: Diagnosis not present

## 2022-01-30 DIAGNOSIS — I10 Essential (primary) hypertension: Secondary | ICD-10-CM | POA: Diagnosis not present

## 2022-01-30 NOTE — Telephone Encounter (Signed)
Called patient to scheduled health coaching session for 2-week f/u. Patient has been scheduled for 7/26 at 2:00pm over the phone.

## 2022-01-31 ENCOUNTER — Telehealth: Payer: Self-pay

## 2022-01-31 ENCOUNTER — Other Ambulatory Visit: Payer: Self-pay

## 2022-01-31 DIAGNOSIS — R7301 Impaired fasting glucose: Secondary | ICD-10-CM

## 2022-01-31 LAB — LIPID PANEL
Chol/HDL Ratio: 3.7 ratio (ref 0.0–4.4)
Cholesterol, Total: 223 mg/dL — ABNORMAL HIGH (ref 100–199)
HDL: 61 mg/dL (ref >39)
LDL Chol Calc (NIH): 147 mg/dL — ABNORMAL HIGH (ref 0–99)
Triglycerides: 85 mg/dL (ref 0–149)
VLDL Cholesterol Cal: 15 mg/dL (ref 5–40)

## 2022-01-31 LAB — CMP14+EGFR
ALT: 17 IU/L (ref 0–32)
AST: 20 IU/L (ref 0–40)
Albumin/Globulin Ratio: 1.7 (ref 1.2–2.2)
Albumin: 4.7 g/dL (ref 3.9–4.9)
Alkaline Phosphatase: 81 IU/L (ref 44–121)
BUN/Creatinine Ratio: 10 — ABNORMAL LOW (ref 12–28)
BUN: 9 mg/dL (ref 8–27)
Bilirubin Total: 0.3 mg/dL (ref 0.0–1.2)
CO2: 22 mmol/L (ref 20–29)
Calcium: 10.1 mg/dL (ref 8.7–10.3)
Chloride: 101 mmol/L (ref 96–106)
Creatinine, Ser: 0.91 mg/dL (ref 0.57–1.00)
Globulin, Total: 2.7 g/dL (ref 1.5–4.5)
Glucose: 105 mg/dL — ABNORMAL HIGH (ref 70–99)
Potassium: 4.5 mmol/L (ref 3.5–5.2)
Sodium: 138 mmol/L (ref 134–144)
Total Protein: 7.4 g/dL (ref 6.0–8.5)
eGFR: 71 mL/min/{1.73_m2} (ref >59)

## 2022-01-31 LAB — CBC
Hematocrit: 40.1 % (ref 34.0–46.6)
Hemoglobin: 13.5 g/dL (ref 11.1–15.9)
MCH: 30.1 pg (ref 26.6–33.0)
MCHC: 33.7 g/dL (ref 31.5–35.7)
MCV: 89 fL (ref 79–97)
Platelets: 426 10*3/uL (ref 150–450)
RBC: 4.49 x10E6/uL (ref 3.77–5.28)
RDW: 12.1 % (ref 11.7–15.4)
WBC: 7.2 10*3/uL (ref 3.4–10.8)

## 2022-01-31 LAB — TSH: TSH: 5.01 u[IU]/mL — ABNORMAL HIGH (ref 0.450–4.500)

## 2022-01-31 LAB — VITAMIN D 25 HYDROXY (VIT D DEFICIENCY, FRACTURES): Vit D, 25-Hydroxy: 79 ng/mL (ref 30.0–100.0)

## 2022-01-31 NOTE — Telephone Encounter (Signed)
Call to pt Confirmed interest in PREP. Explained program to her as well as next start date of late Aug for [place].  Prefers afternoon class Will call her back closer to start of class to complete intake Confirmed she has my number for call back

## 2022-02-03 ENCOUNTER — Encounter: Payer: Self-pay | Admitting: Family Medicine

## 2022-02-03 ENCOUNTER — Ambulatory Visit (INDEPENDENT_AMBULATORY_CARE_PROVIDER_SITE_OTHER): Payer: Medicare Other | Admitting: Family Medicine

## 2022-02-03 VITALS — BP 160/98 | HR 90 | Ht 63.0 in | Wt 159.0 lb

## 2022-02-03 DIAGNOSIS — Z1231 Encounter for screening mammogram for malignant neoplasm of breast: Secondary | ICD-10-CM | POA: Diagnosis not present

## 2022-02-03 DIAGNOSIS — F332 Major depressive disorder, recurrent severe without psychotic features: Secondary | ICD-10-CM

## 2022-02-03 DIAGNOSIS — F411 Generalized anxiety disorder: Secondary | ICD-10-CM

## 2022-02-03 DIAGNOSIS — I10 Essential (primary) hypertension: Secondary | ICD-10-CM

## 2022-02-03 DIAGNOSIS — E785 Hyperlipidemia, unspecified: Secondary | ICD-10-CM | POA: Diagnosis not present

## 2022-02-03 DIAGNOSIS — Z5181 Encounter for therapeutic drug level monitoring: Secondary | ICD-10-CM | POA: Diagnosis not present

## 2022-02-03 LAB — BASIC METABOLIC PANEL
BUN/Creatinine Ratio: 10 — ABNORMAL LOW (ref 12–28)
BUN: 9 mg/dL (ref 8–27)
CO2: 23 mmol/L (ref 20–29)
Calcium: 10.6 mg/dL — ABNORMAL HIGH (ref 8.7–10.3)
Chloride: 100 mmol/L (ref 96–106)
Creatinine, Ser: 0.88 mg/dL (ref 0.57–1.00)
Glucose: 101 mg/dL — ABNORMAL HIGH (ref 70–99)
Potassium: 4.9 mmol/L (ref 3.5–5.2)
Sodium: 137 mmol/L (ref 134–144)
eGFR: 74 mL/min/{1.73_m2} (ref >59)

## 2022-02-03 MED ORDER — BUSPIRONE HCL 5 MG PO TABS: 5.0000 mg | ORAL_TABLET | Freq: Two times a day (BID) | ORAL | 2 refills | Status: DC

## 2022-02-03 NOTE — Patient Instructions (Signed)
Annual with pap in early novemebr, call if you need me sooner  Stop PAXIL, NEW IS BUSPAR  NURSE PLEASE DO A gad [place] MAMMOGRAM AT CHECKOUT  gOOD THAT YOU ARE AT [place]  Thanks for choosing [facility], we consider it a privelige to serve you.

## 2022-02-03 NOTE — Progress Notes (Unsigned)
   LUWANDA STARR     MRN: [id number]      DOB: 03-08-1956   HPI Shirley Bush is here for follow up and re-evaluation of chronic medical conditions, medication management and review of any available recent lab and radiology data.  Preventive health is updated, specifically  Cancer screening and Immunization.   Has attended [facility] and is compliant with medication. The PT c/o intolerance to paxil, GI, and stopped it There are no new concerns. Chronic uncontrolled and severe Depression and anxiety , no interest  Psych management not suicidal or homicidal    ROS Denies recent fever or chills. Denies sinus pressure, nasal congestion, ear pain or sore throat. Denies chest congestion, productive cough or wheezing. Denies chest pains, palpitations and leg swelling Denies abdominal pain, nausea, vomiting,diarrhea or constipation.   Denies dysuria, frequency, hesitancy or incontinence.   PE  BP (!) 160/98   Pulse 90   Ht '5\' 3"'$  (1.6 m)   Wt 159 lb (72.1 kg)   SpO2 98%   BMI 28.17 kg/m   Patient alert and oriented and in no cardiopulmonary distress.  HEENT: No facial asymmetry, EOMI,     Neck supple .  Chest: Clear to auscultation bilaterally.  CVS: S1, S2 no murmurs, no S3.Regular rate.  ABD: Soft non tender.   Ext: No edema  MS: Adequate ROM spine, shoulders, hips and knees.  Skin: Intact, no ulcerations or rash noted.  Psych: Good eye contact, normal affect. Memory intact not anxious or depressed appearing.  CNS: CN 2-12 intact, power,  normal throughout.no focal deficits noted.   Assessment & Plan  Severe recurrent major depression without psychotic features ([place]) No interest in therapy , states paxil meesed with GI ,not suicidal or homicidal, reports intolerance to all SSRI's and SNRI, needs Psych, however has nopt maintained psych treatment in the past,more concerned with HTN now  GAD (generalized anxiety disorder) Reports intolerance to paxil and stopped it, start  buspar, would benefit from Psych but no interest  Hyperlipidemia Hyperlipidemia:Low fat diet discussed and encouraged.   Lipid Panel  Lab Results  Component Value Date   CHOL 223 (H) 01/30/2022   HDL 61 01/30/2022   LDLCALC 147 (H) 01/30/2022   TRIG 85 01/30/2022   CHOLHDL 3.7 01/30/2022   Needs to lower fat intake , not at goal   Essential hypertension Uncontrolled, managed through advanced HTN clinic , which is GREAT, encoraged persistence with treatment plan and keeping all appointments DASH diet and commitment to daily physical activity for a minimum of 30 minutes discussed and encouraged, as a part of hypertension management. The importance of attaining a healthy weight is also discussed.     02/03/2022    3:26 PM 02/03/2022    2:15 PM 01/23/2022    2:05 PM 01/23/2022    1:40 PM 01/23/2022    1:36 PM 12/23/2021    2:06 PM 12/23/2021    1:44 PM  BP/Weight  Systolic BP 094 709 628 366 294 765 465  Diastolic BP 98 98 94 035 95 94 92  Wt. (Lbs)  159   158.1  160.12  BMI  28.17 kg/m2   28.01 kg/m2  28.36 kg/m2

## 2022-02-05 ENCOUNTER — Encounter: Payer: Self-pay | Admitting: Family Medicine

## 2022-02-05 LAB — HEMOGLOBIN A1C
Est. average glucose Bld gHb Est-mCnc: 123 mg/dL
Hgb A1c MFr Bld: 5.9 % — ABNORMAL HIGH (ref 4.8–5.6)

## 2022-02-05 LAB — SPECIMEN STATUS REPORT

## 2022-02-05 NOTE — Assessment & Plan Note (Signed)
Hyperlipidemia:Low fat diet discussed and encouraged.   Lipid Panel  Lab Results  Component Value Date   CHOL 223 (H) 01/30/2022   HDL 61 01/30/2022   LDLCALC 147 (H) 01/30/2022   TRIG 85 01/30/2022   CHOLHDL 3.7 01/30/2022   Needs to lower fat intake , not at goal

## 2022-02-05 NOTE — Assessment & Plan Note (Signed)
Reports intolerance to paxil and stopped it, start buspar, would benefit from Psych but no interest

## 2022-02-05 NOTE — Assessment & Plan Note (Signed)
No interest in therapy , states paxil meesed with GI ,not suicidal or homicidal, reports intolerance to all SSRI's and SNRI, needs Psych, however has nopt maintained psych treatment in the past,more concerned with HTN now

## 2022-02-05 NOTE — Assessment & Plan Note (Signed)
Uncontrolled, managed through advanced HTN clinic , which is [facility]AT, encoraged persistence with treatment plan and keeping all appointments DASH diet and commitment to daily physical activity for a minimum of 30 minutes discussed and encouraged, as a part of hypertension management. The importance of attaining a healthy weight is also discussed.     02/03/2022    3:26 PM 02/03/2022    2:15 PM 01/23/2022    2:05 PM 01/23/2022    1:40 PM 01/23/2022    1:36 PM 12/23/2021    2:06 PM 12/23/2021    1:44 PM  BP/Weight  Systolic BP 382 505 397 673 419 379 024  Diastolic BP 98 98 94 097 95 94 92  Wt. (Lbs)  159   158.1  160.12  BMI  28.17 kg/m2   28.01 kg/m2  28.36 kg/m2

## 2022-02-08 ENCOUNTER — Ambulatory Visit (INDEPENDENT_AMBULATORY_CARE_PROVIDER_SITE_OTHER): Payer: Medicare Other

## 2022-02-08 DIAGNOSIS — Z0000 Encounter for general adult medical examination without abnormal findings: Secondary | ICD-10-CM

## 2022-02-08 NOTE — Progress Notes (Unsigned)
Appointment Outcome: Completed, Session #: 1 Start time: 2:00pm   End time: 2:38am   Total Mins: 38 minutes  AGREEMENTS SECTION   Overall Goal(s): Stress management Improve sleep hygiene                                                 Agreement/Action Steps:  Establish healthy boundaries Practice deep breathing Conduct self-check-ins Implement positive self-talk Engage in daily devotional Take a walk 2-3xs/wk in morning at [place] before bed  Progress Notes:  Patient stated that she received the packet mailed to her with information regarding the practice of some of her action steps outlined above. Patient stated that she read the material. Patient stated that it was hard to focus on herself because she was distracted with others always being around and not having much time to herself.   Patient stated that she has been diligent with daily devotional. Patient shared that she read scriptures and listens to [employer] teachings. Patient stated that she writes down the scriptures that he goes over in his teachings and try to apply things he says to help her make changes in her life.  Patient shared times when she had to calm herself when interacting with others. Patient mentioned that having trouble hearing causes her frustration when others aren't talking clear for her to understand and when she has moments when she talks loudly. Patient stated that she has had to check herself in these moments, so she doesn't get overwhelmed. Patient mentioned that she has been in denial about needing hearing aids.  Patient stated that she has tried to enforce boundaries with others and must remind them of her boundaries. Patient feels that people disregard her boundaries and that they are not on the same page. Patient expressed that this causes her stress and that she is fighting a lost battle. Patient stated that at various times she had to look at herself and ask questions and talk  herself through those times. Patient stated that when dealing with others, she has a hard time sweeping things under the rug, but she tries to talk to them about her concerns while working on keeping herself calm. Patient stated that this is an example of times when she practices deep breathing and separating herself from stressful conversations.   Patient reported that she has not started walking in the mornings at [facility] and expressed that it was hard to explain why she hadn't started because she did not have a good reason why she couldn't. Discussed with patient an alternative to walking outside due to the heat. Patient shared that she had joined the [facility] and tired going to another gym but was not able to engage consistently because she had a hard time being around a lot of people. Patient stated that she still finds herself having the desire to engage in various activities but seem to never get started.  Patient shared that normally she would not fall asleep until 1-2am and it would be harder to stay alert during the day in addition to take her medication. Patient stated that she was going to send a MyChart message to Dr. Oval Linsey regarding the medication spironolactone making her sleepy when she takes it in the morning. Patient stated that since she saw the message about the potential of being charged to receive a response, she  decided not to send the message.   Patient stated that she did back, and leg stretches three days over the past two weeks before going to bed for about 5 minutes. Patient plans to make this a regular activity before bed and thinks that she will be able to continue to sleep better with the changes she made. Patient stated that she has noticed that since she started taking spironolactone in the evening, her blood pressure has been lower. Patient stated that she will talk about the medication change during her next visit with the pharmacist on 8/10.  Patient stated that she  decided to take the medication at night and have been able to sleep better over the past two nights. Patient stated that she laid in bed at 9pm and had fell asleep around 10pm. Patient stated that it was not hard to get up this morning because she knew she had to baby sit. Patient shared that she did take a nap earlier until 10am but took a caffeine tablet to help carry her through the rest of the day.   Patient stated that she doesn't have much of a support system because she has a hard time trusting people and opening fully to others with her emotions. 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 Patient believes that this stems from previous traumas and is affecting her current outlook on herself and her inability to move forward.   Patient was reminded that counseling could be a good resource to help her process the traumatic experiences she's had and how it is current affecting her behaviors (e.g., motivation to engage in various activities). Patient stated that she has had therapy before but felt like they didn't care enough to dig deep to figure out what the root cause for her feeling and behaving the way that she does.     Indicators of Success and Accountability:  Patient has started working on going to bed earlier to improve her sleep hygiene.  Readiness: Patient is in the action phase of stress management and improving her sleep hygiene.  Strengths and Supports: Patient currently does not have support but will be seeking counseling. Patient is relying on her faith as her strength. Challenges and Barriers: Patient feels that she lacks the motivation to engage in various activities.     Coaching Outcomes: Emailed patient behavioral health resources to identify a provider for counseling as requested. Informed patient that if she has difficult identifying a provider that accepts her insurance that she can call the number on the back of her insurance card for  assistance or talk to Dr. Oval Linsey about referring her for behavioral health services.   Patient stated that it would be best for her to do her stretching before taking her medications at bedtime to ensure that she stretch regularly.  Patient did not make changes to her action steps and will continue to implement them over the next two weeks as outlined above.   Attempted: Fulfilled - Patient was able to practice deep breathing and engage in her daily devotion. Patient is working on establishing healthy boundaries with others. Patient has conducted self-check-ins and implemented positive self-talk. Partial - Patient stretched three times before bed over the past two weeks.  Not met - Patient did not start walking in the mornings over the past two weeks.

## 2022-02-20 ENCOUNTER — Telehealth: Payer: Self-pay | Admitting: *Deleted

## 2022-02-20 NOTE — Telephone Encounter (Signed)
Called to offer August PREP class at [facility]. Accepted Tues/Thurs. [phone] beginning 03/07/2022. Intake assessment scheduled 02/28/2022 at 1200.

## 2022-02-23 ENCOUNTER — Ambulatory Visit (INDEPENDENT_AMBULATORY_CARE_PROVIDER_SITE_OTHER): Payer: Medicare Other | Admitting: Pharmacist

## 2022-02-23 ENCOUNTER — Encounter: Payer: Self-pay | Admitting: Pharmacist

## 2022-02-23 ENCOUNTER — Ambulatory Visit (INDEPENDENT_AMBULATORY_CARE_PROVIDER_SITE_OTHER): Payer: Medicare Other

## 2022-02-23 VITALS — BP 154/87 | HR 92 | Wt 158.9 lb

## 2022-02-23 DIAGNOSIS — I10 Essential (primary) hypertension: Secondary | ICD-10-CM

## 2022-02-23 DIAGNOSIS — M797 Fibromyalgia: Secondary | ICD-10-CM | POA: Diagnosis not present

## 2022-02-23 DIAGNOSIS — G8929 Other chronic pain: Secondary | ICD-10-CM

## 2022-02-23 DIAGNOSIS — Z0000 Encounter for general adult medical examination without abnormal findings: Secondary | ICD-10-CM

## 2022-02-23 DIAGNOSIS — R519 Headache, unspecified: Secondary | ICD-10-CM | POA: Diagnosis not present

## 2022-02-23 MED ORDER — AMLODIPINE BESYLATE 10 MG PO TABS: 10.0000 mg | ORAL_TABLET | Freq: Every day | ORAL | 1 refills | Status: DC

## 2022-02-23 MED ORDER — EDARBI 40 MG PO TABS: 1.0000 | ORAL_TABLET | Freq: Every day | ORAL | 0 refills | Status: DC

## 2022-02-23 NOTE — Progress Notes (Signed)
Patient ID: Shirley Bush                 DOB: 07/25/1955                      MRN: [id number]     HPI: Shirley Bush is a 66 y.o. female referred by Dr. Oval Linsey to HTN clinic. PMH is significant for HTN, fibromyalgia, nerve pain, pre DM, medication allergies, stress, headaches and HLD.  Patient has multiple intolerances to antihypertensives. Lisinopril and HCTZ caused rashes. Amlodipine caused swelling at doses higher than '10mg'$ . She currently takes '10mg'$  daily. Has intolerance to hydralazine listed but she does not recall what her issues were.  Has been checking her BP at home but forgot cuff and log. Believes her morning readings are around 160/80 and evening readings are around 148/65.  Has had no adverse effects to spironolactone.   Has issues with nerve pain which started with accidents at work. Has difficulty sitting for long periods of time. Also has issues with frequent chronic headaches.  Reports stress at home. Lives in a 1 bedroom apartment with son and daughter and granddaughter. Son sleeps in living room, daughter sleeps on porch in a tent.  Has trouble sleeping at night so takes caffeine tablets during the day to stay awake.  Does not think she has ever been on an ARB.  Current HTN meds:  Spironolactone '25mg'$  daily Amlodipine '10mg'$  daily  Previously tried:  HCTZ Maxzide Hydralazine Lisinopril  BP goal: <130/80  Wt Readings from Last 3 Encounters:  02/23/22 158 lb 14.4 oz (72.1 kg)  02/03/22 159 lb (72.1 kg)  01/23/22 158 lb 1.6 oz (71.7 kg)   BP Readings from Last 3 Encounters:  02/23/22 (!) 154/87  02/03/22 (!) 160/98  01/23/22 (!) 148/94   Pulse Readings from Last 3 Encounters:  02/23/22 92  02/03/22 90  01/23/22 100    Renal function: Estimated Creatinine Clearance: 62.3 mL/min (by C-G formula based on SCr of 0.88 mg/dL).  Past Medical History:  Diagnosis Date   Allergy Don't remember   Don't remember   Anxiety    Arthritis In 2007   In both  of my shoulders and my knees   Depression 2002   Depression    Phreesia 06/28/2020   Depression    Phreesia 07/23/2020   Fibromyalgia    GERD (gastroesophageal reflux disease) 01/23/2022   Heart murmur Don't remember   I was in 20s   Hypertension 2004   Medial meniscus tear 04/2013   left   Migraines     Current Outpatient Medications on File Prior to Visit  Medication Sig Dispense Refill   busPIRone (BUSPAR) 5 MG tablet Take 1 tablet (5 mg total) by mouth 2 (two) times daily. 60 tablet 2   diphenhydrAMINE (BENADRYL) 25 mg capsule Take 25 mg by mouth every 6 (six) hours as needed.     ibuprofen (ADVIL) 200 MG tablet Take 200 mg by mouth every 6 (six) hours as needed.     Misc Natural Products (AIRBORNE ELDERBERRY) CHEW Chew 1 each by mouth daily.     Multiple Vitamins-Minerals (HAIR SKIN AND NAILS FORMULA) TABS Take 3 tablets by mouth daily.     Specialty Vitamins Products (COLLAGEN ULTRA) CAPS Take 1 each by mouth daily.     spironolactone (ALDACTONE) 25 MG tablet Take 1 tablet (25 mg total) by mouth daily. 90 tablet 3   Zinc Sulfate (ZINC 15 PO) Take by mouth.  Daily     No current facility-administered medications on file prior to visit.    Allergies  Allergen Reactions   Lisinopril Cough   Maxzide [Hydrochlorothiazide W-Triamterene] Itching    Rash   Other Rash   Buspar [Buspirone]     Rash     Beano Meltaways [Alpha-D-Galactosidase] Rash   Crestor [Rosuvastatin Calcium] Rash   Hydrochlorothiazide Rash   Paxil [Paroxetine] Other (See Comments)    Neck pain   Simethicone Rash     Assessment/Plan:  1. Hypertension -   HYPERTENSION CONTROL Vitals:   02/23/22 1329 02/23/22 1416  BP: (!) 148/84 (!) 154/87    The patient's blood pressure is elevated above target today.  The following intervention was performed to address the patient's elevated BP:  - A new medication was prescribed today.    Patient BP in room 148/84 which is above goal of <130/80 but improved  over previous visits.  Patient stress and pain levels likely contributory. Reports has not seen a neurologist for headaches or neuropathy in many years. Will place neuro referral as headache pain and nerve pain may be contributing to HTN.  Patient has not tried an ARB that she is aware of. However she has multiple medication intolerances and allergies.  Hesitant to send prescription medication.  Have sample medications of Edarbi.  Advised she can try this first and if she is able to tolerate, can switch to more affordable ARB at next visit. Patient is agreeable to plan.  Return in 4 weeks for HTN visit #2.  Continue amlodipine '10mg'$  daily Continue spironolactone '25mg'$  daily Start azilsartan '40mg'$  daily (sample) Recheck in 4 weeks  Karren Cobble, PharmD, [place], [place], [place], [place], [place], [address] Phone: [phone], Fax: [phone]

## 2022-02-23 NOTE — Patient Instructions (Addendum)
It was nice meeting you today  Your blood pressure is improving but still higher than 130/80  Please continue your:  Amlodipine '10mg'$  daily Spironolactone '25mg'$  daily  We will start a trial of Edarbi which is azilsartan '40mg'$ . You will take 1 tablet by mouth once a day.  Please call or message if you have any side effects.  If you tolerate it well we can switch to a cheaper generic version  We will see you back in about 4 weeks  Karren Cobble, PharmD, [place], [place], [place], [place], [place], [address] Phone: [phone], Fax: [phone]

## 2022-02-24 NOTE — Progress Notes (Signed)
Appointment Outcome: Completed, Session #: 2 Start time: 2:24pm   End time: 3:03pm   Total Mins: 29 minutes  AGREEMENTS SECTION   Overall Goal(s): Stress management Improve sleep hygiene                                                 Agreement/Action Steps:  Stress management Establish healthy boundaries Practice deep breathing Conduct self-check-ins Implement positive self-talk Engage in daily devotional  Improve sleep hygiene Take a walk 2-3xs/wk in morning at [place] before bed   Progress Notes:  Patient rated her stress level an 8/10. Patient stated that she didn't rate her stress level higher is due to her not letting things get the best of her. Patient shared that she has been engaging in her daily devotional and relying in on her faith and she prays about the things that are stressing her. Patient reported that she hasn't been focused on herself over the past two weeks.   Patient mentioned that she has not practiced deep breathing, or taking walks in the am at [facility]. Patient stated that she will be meeting with Pam on 8/15 at the [facility] about starting PREP possibly the week of 8/27. Patient mentioned that she is assertive at times and enforces boundaries with her family to help reduce stress. Patient shared that she still separates herself from others and go in her room to be along. Patient stated that she also takes breaks to sit outside alone when she is stressed. Patient stated that she wants to go walking or go places and do different things. Patient shared that she tries to talk herself into doing things, but her mind doesn't let her move.   Patient stated that she continues to have trouble with her sleep. Patient reported that she is taking two spironolactone in the morning thinking that it would help improve her sleep. Patient shared that it has helped get her blood pressure down some. Patient stated that she forgot to talk to Dr. Oval Linsey about this.  Patient reported that she falls asleep between 12-2am and do not wake up until 10-11am.   Patient stated that she has tried stretching before bed, but it did not help. Patient mentioned that it aggravated her fibromyalgia symptoms and intensified the pain, making it challenging to sleep. Patient shared that she has to take a caffeine pill in the morning to get going, especially when she has to babysit her great nieces and nephews. Patient feels like her body is still used to working third shift but feels that if she can tire herself out during the day it may help her sleep better.   Patient stated that she has looked over the resources for counseling but has not contacted anyone. Patient was informed that if she would like to be referred that Dr. Oval Linsey would be able to do so if she chooses to want this service.    Indicators of Success and Accountability:  Patient has maintained her daily devotional and tried implementing stretching before bed. Readiness: Patient is in the preparation phase of stress management and improve sleep hygiene.  Strengths and Supports: Patient currently do not have support. Patient stated that she is relying on her faith and being more assertive.  Challenges and Barriers: Patient lacks motivation to engage in activities, and stress inhibits her from engaging in self-care.  Coaching Outcomes: Sent a message to Dr. Oval Linsey regarding the patient's change in taking one of her medications to aid in sleep for further follow up.  Patient has been referred to a neurologist regarding her concerns with fibromyalgia.   Patient will meet with Pam regarding PREP on 8/15.   Attempted: Fulfilled - Patient is establishing healthy boundaries, engaging in her daily devotional, and stretched before bed.   Not met - Patient is not conducting intentional self-check-ins, implement positive self-talk, or practice deep breathing.    Not attempted: Dropped/Revised - Patient  has not walked at [facility] but plans on participating in [place] after meeting with Jeannene Patella on 8/15.

## 2022-02-28 ENCOUNTER — Encounter: Payer: Self-pay | Admitting: *Deleted

## 2022-02-28 NOTE — Progress Notes (Signed)
[facility] PREP Evaluation  Patient Details  Name: Shirley Bush MRN: [id number] Date of Birth: 1955-08-03 Age: 66 y.o. PCP: Fayrene Helper, MD  Vitals:   02/28/22 1230  BP: 126/72  Pulse: (!) 110  SpO2: 98%  Weight: 156 lb 12.8 oz (71.1 kg)  Height: '5\' 3"'$  (1.6 m)     [facility] Eval - 02/28/22 1500       [facility] "PREP" Location   [facility] "PREP" Location [place]      Referral    Referring Provider Oval Linsey    Reason for referral Hypertension;Inactivity;Obesitity/Overweight    Program Start Date 03/07/22      Measurement   Waist Circumference 35.6 inches    Hip Circumference 39 inches    Body fat 40.1 percent      Information for Trainer   Goals Healthier diet, estblish exercise routine, stress management    Current Exercise none    Orthopedic Concerns Left knee replacement,low back pain,rotatorcuff repairs bilateral shoulders, fibromyalgia   self reported   Current Barriers family dependancy      Mobility and Daily Activities   I find it easy to walk up or down two or more flights of stairs. 4    I have no trouble taking out the trash. 4    I do housework such as vacuuming and dusting on my own without difficulty. 4    I can easily lift a gallon of milk (8lbs). 4    I can easily walk a mile. 3    I have no trouble reaching into high cupboards or reaching down to pick up something from the floor. 4    I do not have trouble doing out-door work such as Armed forces logistics/support/administrative officer, raking leaves, or gardening. 3      Mobility and Daily Activities   I feel younger than my age. 3    I feel independent. 4    I feel energetic. 3    I live an active life.  1    I feel strong. 3    I feel healthy. 1    I feel active as other people my age. 1      How fit and strong are you.   Fit and Strong Total Score 42            Past Medical History:  Diagnosis Date   Allergy Don't remember   Don't remember   Anxiety    Arthritis In 2007   In both of my shoulders and my knees    Depression 2002   Depression    Phreesia 06/28/2020   Depression    Phreesia 07/23/2020   Fibromyalgia    GERD (gastroesophageal reflux disease) 01/23/2022   Heart murmur Don't remember   I was in 20s   Hypertension 2004   Medial meniscus tear 04/2013   left   Migraines    Past Surgical History:  Procedure Laterality Date   [place] N/A    Phreesia 06/28/2020   COLONOSCOPY  02/05/2009   COLONOSCOPY N/A 03/06/2014   Procedure: COLONOSCOPY;  Surgeon: Danie Binder, MD;  [facility];  Service: Endoscopy;  Laterality: N/A;  10:30 AM   COLONOSCOPY WITH PROPOFOL N/A 01/14/2019   Surgeon: Danie Binder, MD;  one 3 mm tubular adenoma in the mid ascending colon, moderate diverticulosis in entire colon, external and internal  hemorrhoids, tortuous left colon. Recommended repeat in 5 years.    FINGER SURGERY Right    long finger   HERNIA REPAIR N/A    Phreesia 06/28/2020   JOINT REPLACEMENT N/A    Phreesia 06/28/2020   KNEE ARTHROSCOPY WITH MEDIAL MENISECTOMY Left 04/24/2013   Procedure: LEFT KNEE ARTHROSCOPY CHONDROPLASTY WITH MEDIAL MENISECTOMY;  Surgeon: Ninetta Lights, MD;  Location: [place];  Service: Orthopedics;  Laterality: Left;   SHOULDER ARTHROSCOPY Left 2009   SHOULDER ARTHROSCOPY Right    SHOULDER ARTHROSCOPY WITH ROTATOR CUFF REPAIR Left 11/25/2004   TOTAL KNEE ARTHROPLASTY Left 11/11/2014   Procedure: LEFT TOTAL KNEE ARTHROPLASTY;  Surgeon: Kathryne Hitch, MD;  Location: [place];  Service: Orthopedics;  Laterality: Left;   TUBAL LIGATION  6578   UMBILICAL HERNIA REPAIR  2008   Social History   Tobacco Use  Smoking Status Never  Smokeless Tobacco Never    Norris Cross 02/28/2022, 3:52 PM  To start PREP Class at [facility] on 03/07/2022 Tuesday/Thursday [id number].

## 2022-03-07 ENCOUNTER — Encounter: Payer: Self-pay | Admitting: *Deleted

## 2022-03-07 ENCOUNTER — Ambulatory Visit: Payer: Medicare Other | Admitting: Physician Assistant

## 2022-03-07 NOTE — Progress Notes (Signed)
[facility] PREP Weekly Session  Patient Details  Name: Shirley Bush MRN: [id number] Date of Birth: 08-22-1955 Age: 66 y.o. PCP: Fayrene Helper, MD  There were no vitals filed for this visit.   [facility] Weekly seesion - 03/07/22 1600       [facility] "PREP" Location   [facility]PREP" Location [facility]      Weekly Session   Topic Discussed Goal setting and welcome to the program   Introductions, review workbook, tour of facility. Sitting no longer than 30 minutes   Classes attended to date [place], [place] 03/07/2022, 4:44 PM

## 2022-03-08 ENCOUNTER — Telehealth (HOSPITAL_BASED_OUTPATIENT_CLINIC_OR_DEPARTMENT_OTHER): Payer: Self-pay | Admitting: *Deleted

## 2022-03-08 ENCOUNTER — Telehealth: Payer: Self-pay

## 2022-03-08 DIAGNOSIS — Z0000 Encounter for general adult medical examination without abnormal findings: Secondary | ICD-10-CM

## 2022-03-08 NOTE — Telephone Encounter (Signed)
Returned patient's call requesting to reschedule health coaching appointment on 8/24 at 2:00pm because she will start participating in PREP at that time. Patient requested to be scheduled earlier in the day for our phone visit. Patient has been scheduled for 8/24 at 11:00am. Patient will be called at this time.   Felix Meras Truman Hayward, [facility] Guide, Health Coach [address] [place] [address] Telephone: [phone] Email: [email or web address]

## 2022-03-08 NOTE — Telephone Encounter (Signed)
-----   Message from Avelino Leeds sent at 02/24/2022  5:10 PM EDT ----- Regarding: Misuse of Medication Hi team,  I have been working with this patient on stress management and improving sleep hygiene. Patient has been taking two spironolactone pills in the morning thinking that it was going to help improve her sleep. Patient is having a hard time sleeping due to stress, pain from fibromyalgia, and use of caffeine pills during the day to stay awake. Patient is falling asleep between 12-2am and wakes up between 10-11am. I wanted to make you aware of this because the medication is not being taking as prescribed. I also have talked to her about engaging in counseling due to feeling depressed and saying things like her brain didn't completely wake up after having knee surgery and that she wants to do things but her brain won't let her, as well as she isn't motivated to do things for herself because of stress.   Thanks, Amy

## 2022-03-08 NOTE — Telephone Encounter (Signed)
Spoke with patient and she just wanted to know if she could take Amlodipine and Spironolactone together, advised ok Patient stated she had not been taking 2 Spironolactone daily

## 2022-03-09 ENCOUNTER — Ambulatory Visit (HOSPITAL_BASED_OUTPATIENT_CLINIC_OR_DEPARTMENT_OTHER): Payer: Medicare Other

## 2022-03-09 ENCOUNTER — Telehealth: Payer: Self-pay

## 2022-03-09 DIAGNOSIS — Z0000 Encounter for general adult medical examination without abnormal findings: Secondary | ICD-10-CM

## 2022-03-09 NOTE — Telephone Encounter (Signed)
Patient stated that she is not feeling well and would like to reschedule for 8/28 at 11:00am. Patient has been rescheduled and will be called that time.  Hollynn Garno Truman Hayward, [facility] Guide, Health Coach [address] [place] [address] Telephone: [phone] Email: [email or web address]

## 2022-03-13 ENCOUNTER — Ambulatory Visit: Payer: Medicare Other | Attending: Cardiology

## 2022-03-13 DIAGNOSIS — Z0000 Encounter for general adult medical examination without abnormal findings: Secondary | ICD-10-CM

## 2022-03-13 NOTE — Progress Notes (Signed)
Appointment Outcome: Completed, Session #: 3 Start time: 11:01am   End time: 11:32am   Total Mins: 31 minutes  AGREEMENTS SECTION   Overall Goal(s): Stress management Improve sleep hygiene                                                 Agreement/Action Steps:  Stress management Establish healthy boundaries Practice deep breathing Conduct self-check-ins Implement positive self-talk Engage in daily devotional   Increase physical activity Attend PREP as scheduled  Progress Notes:  Patient stated that her stress level is 10/10. Patient stated that she is continuing to work to let things go of situations that she cannot change with others. Patient shared that lately she doesn't know if there's anything else that she can do to help come up with solutions to resolve issues within her family. Patient stated that due to the stress she has not been able to maintain her healthy eating habits.   Patient mentioned that she has been establishing boundaries with others but feel that her boundaries are not being respected. Patient shared that she asks for help when it is needed but do not have the support needed. Patient stated to deal with her stress, feeling overwhelmed, or down she separates herself from those that are adding to those feelings. Patient shared that she may spend time alone in her room or go outside for fresh air to avoid conflict.   Patient shared that she has been practicing positive self-talk along with praying and reading. Patient shared that when she is focused on positive things and being at peace, she can calm herself better. Patient stated that she has been thinking about counseling more but would like to have a paper copy of the resources that she was emailed previously.   Patient mentioned that she continues to have trouble falling asleep and wake up late in the mornings. Patient shared that she has had to continue using caffeine tablets to function. Patient stated that she  has been experiencing lower right back pain that causes her not to sleep well also. Patient was referred to a neurologist regarding migraines and fibromyalgia pain but was informed by the neurology office that they do not treat fibromyalgia.   Patient stated that she met for PREP and has begun the program. 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     Indicators of Success and Accountability:  Patient has started PREP. Readiness: patient is in the action stage of managing stress and improving sleep. Strengths and Supports: Patient did not identify any supports. Patient is trying to be assertive and maintain peace. Challenges and Barriers: Patient does not feel that she had the support that she needs to help reduce her stress.     Coaching Outcomes: Provided an update to patient regarding Dr. Blenda Mounts recommendation regarding talking to her PCP about her sleep concerns. Sent message to Dr. Moshe Cipro (patient's PCP) regarding the patient sleep issues along with discussing with the patient concerns with pain.   Patient was mailed a paper copy of the mental health resources that was previously emailed to her.   Sent message to patient's care provider regarding the patient's concerns with her ability to sleep that is  exacerbated by stress and pain.    Patient will include the following action steps outlined below to improve sleep over the next two weeks.   Improve sleep hygiene Speak with provider about sleep disturbances Seek a counselor  Attempted: Fulfilled - Patient completed the biweekly agreement in full and was able to meet the challenge.

## 2022-03-14 ENCOUNTER — Encounter: Payer: Self-pay | Admitting: *Deleted

## 2022-03-14 NOTE — Progress Notes (Signed)
[facility] PREP Weekly Session  Patient Details  Name: Shirley Bush MRN: [id number] Date of Birth: [birth year] Age: 66 y.o. PCP: Fayrene Helper, MD  Vitals:   03/14/22 1400  Weight: 157 lb 3.2 oz (71.3 kg)     [facility] Weekly seesion - 03/14/22 1500       [facility] "PREP" Location   [facility] "PREP" Location [facility]      Weekly Session   Topic Discussed Importance of resistance training;Other ways to be active   [facility] class schedule, water intake 1/2 body weight in oz or 64oz per day.   Minutes exercised this week 60 minutes    Classes attended to date [place], [place] 03/14/2022, 4:34 PM

## 2022-03-21 ENCOUNTER — Encounter: Payer: Self-pay | Admitting: *Deleted

## 2022-03-21 NOTE — Progress Notes (Signed)
[facility] PREP Weekly Session  Patient Details  Name: Shirley Bush MRN: [id number] Date of Birth: March 23, 1956 Age: 66 y.o. PCP: Fayrene Helper, MD  Vitals:   03/21/22 1612  Weight: 158 lb 9.6 oz (71.9 kg)     [facility] Weekly seesion - 03/21/22 1600       [facility] "PREP" Location   [facility] "PREP" Location [facility]      Weekly Session   Topic Discussed Healthy eating tips   Sodium intake 1500-2300 mg/day, added sugars 24 gm/day for women 36gm/day for men, YUKA app.   Minutes exercised this week 10 minutes    Classes attended to date [place], [place] 03/21/2022, 4:18 PM

## 2022-03-23 ENCOUNTER — Ambulatory Visit: Payer: Medicare Other | Attending: Cardiology | Admitting: Pharmacist

## 2022-03-23 VITALS — BP 146/90 | HR 95

## 2022-03-23 DIAGNOSIS — I10 Essential (primary) hypertension: Secondary | ICD-10-CM | POA: Diagnosis not present

## 2022-03-23 MED ORDER — NEBIVOLOL HCL 5 MG PO TABS: 5.0000 mg | ORAL_TABLET | Freq: Every day | ORAL | 2 refills | Status: DC

## 2022-03-23 NOTE — Progress Notes (Signed)
Patient ID: Shirley Bush                 DOB: 26-Apr-1956                      MRN: [id number]     HPI: Shirley Bush is a 66 y.o. female referred by Dr. Oval Linsey to HTN clinic. PMH is significant for HTN, fibromyalgia, nerve pain, pre DM, medication allergies, stress, headaches and HLD.  Patient has multiple intolerances to antihypertensives. Lisinopril and HCTZ caused rashes. Amlodipine caused swelling at doses higher than '10mg'$ . She currently takes '10mg'$  daily. Has intolerance to hydralazine listed but she does not recall what her issues were. Tolerates spironolactone.  Reports stress at home. Lives in a 1 bedroom apartment with son and daughter and granddaughter. Son sleeps in living room, daughter sleeps on porch in a tent.  Has a lot of issues with stress and lack of sleep.  Made neuro referral at last visit.  At last visit patient was given a sample of azilsartan since she had never been on an ARB before to see if she would tolerate.  However she discontinued it after about 2 weeks due to feeling poorly. Did not seem to make much impact on her BP.  Remembered BP log today.   Recent BP readings: 9/7: 142/81 9/6: 152/86, 148/90 9/5: 148/84, 151/85 9/4: 147/86, 138/81  Majority of systolic readings in high 140s to low 150s over 80s/90s.  Has first neuro appointment next week.   Current HTN meds:  Spironolactone '25mg'$  daily Amlodipine '10mg'$  daily Azilsartan '40mg'$  (d/c)  Previously tried:  HCTZ Maxzide Hydralazine Lisinopril  BP goal: <130/80  Wt Readings from Last 3 Encounters:  02/23/22 158 lb 14.4 oz (72.1 kg)  02/03/22 159 lb (72.1 kg)  01/23/22 158 lb 1.6 oz (71.7 kg)   BP Readings from Last 3 Encounters:  02/23/22 (!) 154/87  02/03/22 (!) 160/98  01/23/22 (!) 148/94   Pulse Readings from Last 3 Encounters:  02/23/22 92  02/03/22 90  01/23/22 100    Renal function: Estimated Creatinine Clearance: 62.3 mL/min (by C-G formula based on SCr of 0.88  mg/dL).  Past Medical History:  Diagnosis Date   Allergy Don't remember   Don't remember   Anxiety    Arthritis In 2007   In both of my shoulders and my knees   Depression 2002   Depression    Phreesia 06/28/2020   Depression    Phreesia 07/23/2020   Fibromyalgia    GERD (gastroesophageal reflux disease) 01/23/2022   Heart murmur Don't remember   I was in 20s   Hypertension 2004   Medial meniscus tear 04/2013   left   Migraines     Current Outpatient Medications on File Prior to Visit  Medication Sig Dispense Refill   busPIRone (BUSPAR) 5 MG tablet Take 1 tablet (5 mg total) by mouth 2 (two) times daily. 60 tablet 2   diphenhydrAMINE (BENADRYL) 25 mg capsule Take 25 mg by mouth every 6 (six) hours as needed.     ibuprofen (ADVIL) 200 MG tablet Take 200 mg by mouth every 6 (six) hours as needed.     Misc Natural Products (AIRBORNE ELDERBERRY) CHEW Chew 1 each by mouth daily.     Multiple Vitamins-Minerals (HAIR SKIN AND NAILS FORMULA) TABS Take 3 tablets by mouth daily.     Specialty Vitamins Products (COLLAGEN ULTRA) CAPS Take 1 each by mouth daily.     spironolactone (  ALDACTONE) 25 MG tablet Take 1 tablet (25 mg total) by mouth daily. 90 tablet 3   Zinc Sulfate (ZINC 15 PO) Take by mouth. Daily     No current facility-administered medications on file prior to visit.    Allergies  Allergen Reactions   Lisinopril Cough   Maxzide [Hydrochlorothiazide W-Triamterene] Itching    Rash   Other Rash   Buspar [Buspirone]     Rash     Beano Meltaways [Alpha-D-Galactosidase] Rash   Crestor [Rosuvastatin Calcium] Rash   Hydrochlorothiazide Rash   Paxil [Paroxetine] Other (See Comments)    Neck pain   Simethicone Rash     Assessment/Plan:  1. Hypertension -   HYPERTENSION CONTROL Vitals:   03/23/22 1346 03/23/22 1349  BP: (!) 151/91 (!) 146/90    The patient's blood pressure is elevated above target today.  In order to address the patient's elevated BP: A new  medication was prescribed today.    Patient BP in room today 151/81 and 146/90 which are above goal of <130/80 and similar to her home readings. Unfortunately she could not tolerate ARB either. Has tried most medication classes but has not tried BB yet.  Will try low dose nebivolol which may also help pulse rate and help her sleep better. Will start at '5mg'$  in the evening and consider titrating up in 1 month if she is able to tolerate. Patient to continue to monitor BP at home and will see back in 1 month for HTN #3.   Karren Cobble, PharmD, BCACP, [place], [place], [place], [place], [address] Phone: [phone], Fax: [phone]

## 2022-03-23 NOTE — Patient Instructions (Addendum)
It was good seeing you again  We would like your blood pressure to be less than 130/80  Please continue your spironolactone '25mg'$  and amlodipine '10mg'$  daily  We will start a new medication called nebivolol '5mg'$  once a day in the evening  Please continue to check your blood pressure at home  We will see you back in 1 month  Karren Cobble, PharmD, [place], [place], [place], [place], [place], [address] Phone: [phone], Fax: [phone]

## 2022-03-24 ENCOUNTER — Encounter: Payer: Self-pay | Admitting: Pharmacist

## 2022-03-27 ENCOUNTER — Ambulatory Visit: Payer: Medicare Other | Attending: Internal Medicine

## 2022-03-27 DIAGNOSIS — Z0000 Encounter for general adult medical examination without abnormal findings: Secondary | ICD-10-CM

## 2022-03-27 NOTE — Progress Notes (Signed)
Appointment Outcome: Completed, Session #: 1                        Start time: 11:01am   End time: 11:30am   Total Mins: 29 minutes  AGREEMENTS SECTION   Overall Goal(s): Stress management Increase physical activity Improve sleep hygiene                                                 Agreement/Action Steps:  Stress management Establish healthy boundaries Practice deep breathing Conduct self-check-ins Implement positive self-talk Engage in daily devotional   Increase physical activity Attend PREP as scheduled  Improve sleep hygiene Speak with provider about sleep disturbances Seek a counselor    Progress Notes:  Patient stated that she is still trying to enforce healthy boundaries with others and be empathic but feels that her boundaries are still not respected, and nobody cares about how she feels or what she needs. Patient reported that to avoid conflict with others when her boundaries are not respected, she removes herself from the stressful situation by going in her room or outside to regroup. Patient shared that she is not intentionally practicing deep breathing and unfortunately got away from doing so.   Patient continues to conduct self-check-ins. Patient mentioned that these are the times that she implements prayer as her positive self-talk, and she also uses positive self-talk to encourage herself but struggle with motivation. Patient shared that she continues to engage in her daily devotional. Patient stated that she also watches motivational videos to help boost her mood. Patient expressed that prayer and reading is what keeps her grounded and calm.   Patient stated that it has been a struggle with maintaining her attendance at PREP due to competing priorities such as doctor's appointments. Patient stated that last Thursday she had a pharmacy visit and was late to class. Patient stated that she manages to do the exercises but finds herself not engaged socially. Patient  mentioned that she shared this concern about lack of communicating while in class with the instructor and her inability to do the at home exercises/writing activities. Patient stated that although she is challenged with her schedule, she still wants to participate in PREP because she wants to help herself.   Patient mentioned that she did go through the list of counseling resources but stated that she prefers a provider that is close to home. Patient shared that if she could be referred for in-person sessions that would be okay. Patient has not heard from her PCP about her sleep concerns after a message was sent to her about this concern.     Indicators of Success and Accountability:  Patient was able to juggle a loaded scheduled despite feeling unmotivated and stressed.  Readiness: Patient is in the action stage of stress management and increasing physical activity. Patient is in the preparation stage of improving sleep.  Strengths and Supports: Patient is not being supported currently. Patient is relying on her faith as a strength.  Challenges and Barriers:  Patient is juggling a lot of responsibilities and scheduling is a concern.     Coaching Outcomes: Patient feels the need to talk to a counselor due to lack of motivation, stress, feeing down, and not feeling as her whole self.   Discussed with patient ways that she could incorporate physical  activity into her break away moments in her room or outside that includes the exercises recommended at PREP. Patient shared that she thinks about doing these things but cannot find the motivation due to stress to get going.   Patient will continue to implement her action steps as outline above over the next two weeks except for improving sleep until care is established with a counselor per referral and a referral is made to address her sleep disturbances. Message was sent to [facility] regarding these concerns.    Attempted: Fulfilled - Patient  has been able to continue engaging in daily devotional, implementing positive self-talk through prayer, and conducting self-check-ins. Patient has set healthy boundaries with others.  Partial - Patient has not been able to attend both classes each week for PREP over the past two weeks. Not met - Patient is not intentionally practicing deep breathing. Patient has not been able to establish care with a counselor or speak to PCP about sleep disturbances.    Referral: A referral was submitted to [place] [facility] for mental health concerns. Patient has a Hx of mental health Dx.

## 2022-03-29 ENCOUNTER — Ambulatory Visit: Payer: Medicare Other | Admitting: Psychiatry

## 2022-04-07 ENCOUNTER — Telehealth: Payer: Self-pay

## 2022-04-07 DIAGNOSIS — Z0000 Encounter for general adult medical examination without abnormal findings: Secondary | ICD-10-CM

## 2022-04-07 NOTE — Telephone Encounter (Signed)
Called patient to update her on the response from Dr. Moshe Cipro and Laurann Montana regarding seeing a rheumatologist for fibromyalgia pain and needing to speak to someone about insomnia. Dr. Moshe Cipro advised that the patient make an appointment with her to discuss these issues. Patient verbally expressed understanding. Patient had no further concerns to discuss.   Andersyn Fragoso Truman Hayward [facility] Guide, Health Coach [address], [address] [place] [address] Telephone: [phone] Email: [email or web address]

## 2022-04-10 ENCOUNTER — Ambulatory Visit: Payer: Medicare Other | Attending: Internal Medicine

## 2022-04-10 DIAGNOSIS — Z0000 Encounter for general adult medical examination without abnormal findings: Secondary | ICD-10-CM

## 2022-04-10 NOTE — Progress Notes (Signed)
Appointment Outcome: Completed, Session #: 5                        Start time: 11:02am   End time: 11:29am  Total Mins: 27 minutes  AGREEMENTS SECTION   Overall Goal(s): Stress management Increase physical activity Improve sleep hygiene                                                 Agreement/Action Steps:  Stress management Establish healthy boundaries Practice deep breathing Conduct self-check-ins Implement positive self-talk Engage in daily devotional   Increase physical activity Attend PREP as scheduled   Improve sleep hygiene Speak with provider about sleep disturbances Seek a counselor  Progress Notes:  Patient shared that she is still having trouble sleeping. Patient stated that she forgot to reach out to her PCP to schedule an appointment for sleep disturbances from insomnia and fibromyalgia pain. Patient stated that he will try to call soon so she doesn't forget.  Patient stated that she has been intentional about taking breaks to practice deep breathing and getting up to walk about every 30 minutes to manage her stress. Patient mentioned that she has been praying more and releasing things that normally stress her out. Patient continues to engage in her daily devotional by reading scriptures. Patient uses prayer and scripture as her way of conducting self-check-ins and implementing positive self-talk. Patient shared that she continues to tell others what her boundaries are and is assertive with them even if they do not adhere to them. Patient mentioned that she still practices separating herself from stressful conversations and situations by going to her room and taking a break outside to avoid conflict.   Patient rated her stress level a 5/10 stated that she is in a better place now than she was a couple of weeks ago. Patient reported that she had stopped taking the Buspar due to it causing joint pain. Patient stated that this hindered her from participating in [place].  Patient mentioned that the adverse reaction to stopping the medication is that she was down emotionally and mentally over the past two weeks. Patient expressed that she feels much better now. Patient mentioned that she considered starting the medication but didn't want to start hurting again and has the desire to return to PREP.   Patient had a referral sent for behavioral health on 9/11. Patient stated that she had not received a call from anyone about counseling.    Indicators of Success and Accountability: Patient stress level has reduced to a 5/10 by implementing her action steps.  Readiness: Patient is in the action stage of managing stress, increasing physical activity, and improving sleep hygiene.  Strengths and Supports: Patient is being supported by Bethena Roys. Patient is relying on her faith as a strength and determination.  Challenges and Barriers: Patient does not foresee any challenges over the next two weeks with implementing her action steps.    Coaching Outcomes: Mentioned to patient the idea of sending a MyChart message if calling would be hard to remember.   Followed up with the referral and it was stated that the referral had not been received. Updated the referral to ensure that the right department would receive the necessary information to reach out to the patient.   Sent patient a MyChart message with update on referral  status and contact information to [facility] [place].   Patient will continue to implement her action steps as outlined above of the next two weeks. Patient will resume PREP on tomorrow.     Attempted: Fulfilled - Patient was able to implement all her action steps towards managing stress over the past two weeks.   Not met - Patient has not been able to connect with any providers for counseling. Patient did not participate in PREP over the past two weeks. Patient has not contacted her PCP to schedule an appointment to  discuss sleep and pain issues.

## 2022-04-24 ENCOUNTER — Telehealth (HOSPITAL_BASED_OUTPATIENT_CLINIC_OR_DEPARTMENT_OTHER): Payer: Self-pay

## 2022-04-24 ENCOUNTER — Ambulatory Visit: Payer: Medicare Other | Attending: Cardiovascular Disease

## 2022-04-24 ENCOUNTER — Encounter (HOSPITAL_BASED_OUTPATIENT_CLINIC_OR_DEPARTMENT_OTHER): Payer: Self-pay

## 2022-04-24 DIAGNOSIS — Z0000 Encounter for general adult medical examination without abnormal findings: Secondary | ICD-10-CM

## 2022-04-24 NOTE — Telephone Encounter (Signed)
Called patient to hold health coaching session over the phone. Patient did not answer. Was not able to leave voicemail due to receiving message that call could not be completed at this time.    Valera Vallas Truman Hayward [facility] Guide, Health Coach [address] [place] [address] Telephone: [phone] Email: [email or web address]

## 2022-04-26 ENCOUNTER — Ambulatory Visit: Payer: Medicare Other | Admitting: Psychiatry

## 2022-04-28 ENCOUNTER — Ambulatory Visit: Payer: Medicare Other

## 2022-04-28 ENCOUNTER — Ambulatory Visit: Payer: Medicare Other | Attending: Cardiology | Admitting: Pharmacist

## 2022-04-28 VITALS — BP 143/75 | HR 68

## 2022-04-28 DIAGNOSIS — I10 Essential (primary) hypertension: Secondary | ICD-10-CM | POA: Diagnosis not present

## 2022-04-28 MED ORDER — NEBIVOLOL HCL 10 MG PO TABS: 10.0000 mg | ORAL_TABLET | Freq: Every day | ORAL | 5 refills | Status: DC

## 2022-04-28 NOTE — Progress Notes (Signed)
Patient ID: Shirley Bush                 DOB: 08-Dec-1955                      MRN: [id number]     HPI: Shirley Bush is a 66 y.o. female referred by Dr. Oval Linsey to HTN clinic. PMH is significant for HTN, fibromyalgia, nerve pain, pre DM, medication allergies, stress, headaches and HLD.  Patient has multiple intolerances to antihypertensives. Lisinopril and HCTZ caused rashes. Amlodipine caused swelling at doses higher than '10mg'$ . She currently takes '10mg'$  daily. Has intolerance to hydralazine listed but she does not recall what her issues were. Tolerates spironolactone.  Continues to rpeort stress at home which has only taken a turn for the worse. She is now responsible for baby sitting her 42 year old grand nephew daily while her nephew works. Is not sleeping well and because she was overwhelmed, canceled her neuro appointment. Also has missed appointments with care guide.  Was started on nebivilol last visit and patient has tolerated well and has improved her BP.  Whereas last month BP was in 140s-150s, has now decreased to 130s-140s. Patient is satisfied with it and would like to increase. She would like to discontinue spironolactone due to pill burden.    Current HTN meds:  Spironolactone '25mg'$  daily Amlodipine '10mg'$  daily Nebivolol '5mg'$  daily  Previously tried:  HCTZ Maxzide Hydralazine Lisinopril Azilsartan  BP goal: <130/80  Wt Readings from Last 3 Encounters:  02/23/22 158 lb 14.4 oz (72.1 kg)  02/03/22 159 lb (72.1 kg)  01/23/22 158 lb 1.6 oz (71.7 kg)   BP Readings from Last 3 Encounters:  02/23/22 (!) 154/87  02/03/22 (!) 160/98  01/23/22 (!) 148/94   Pulse Readings from Last 3 Encounters:  02/23/22 92  02/03/22 90  01/23/22 100    Renal function: Estimated Creatinine Clearance: 62.3 mL/min (by C-G formula based on SCr of 0.88 mg/dL).  Past Medical History:  Diagnosis Date   Allergy Don't remember   Don't remember   Anxiety    Arthritis In 2007   In  both of my shoulders and my knees   Depression 2002   Depression    Phreesia 06/28/2020   Depression    Phreesia 07/23/2020   Fibromyalgia    GERD (gastroesophageal reflux disease) 01/23/2022   Heart murmur Don't remember   I was in 20s   Hypertension 2004   Medial meniscus tear 04/2013   left   Migraines     Current Outpatient Medications on File Prior to Visit  Medication Sig Dispense Refill   busPIRone (BUSPAR) 5 MG tablet Take 1 tablet (5 mg total) by mouth 2 (two) times daily. 60 tablet 2   diphenhydrAMINE (BENADRYL) 25 mg capsule Take 25 mg by mouth every 6 (six) hours as needed.     ibuprofen (ADVIL) 200 MG tablet Take 200 mg by mouth every 6 (six) hours as needed.     Misc Natural Products (AIRBORNE ELDERBERRY) CHEW Chew 1 each by mouth daily.     Multiple Vitamins-Minerals (HAIR SKIN AND NAILS FORMULA) TABS Take 3 tablets by mouth daily.     Specialty Vitamins Products (COLLAGEN ULTRA) CAPS Take 1 each by mouth daily.     spironolactone (ALDACTONE) 25 MG tablet Take 1 tablet (25 mg total) by mouth daily. 90 tablet 3   Zinc Sulfate (ZINC 15 PO) Take by mouth. Daily     No  current facility-administered medications on file prior to visit.    Allergies  Allergen Reactions   Lisinopril Cough   Maxzide [Hydrochlorothiazide W-Triamterene] Itching    Rash   Other Rash   Buspar [Buspirone]     Rash     Beano Meltaways [Alpha-D-Galactosidase] Rash   Crestor [Rosuvastatin Calcium] Rash   Hydrochlorothiazide Rash   Paxil [Paroxetine] Other (See Comments)    Neck pain   Simethicone Rash     Assessment/Plan:  1. Hypertension -   HYPERTENSION CONTROL Vitals:   04/28/22 1341 04/28/22 1342  BP: (!) 148/76 (!) 143/75    The patient's blood pressure is elevated above target today.  In order to address the patient's elevated BP: A current anti-hypertensive medication was adjusted today.     Patient BP in room today 148/76 and 143/75 which are above goal of <130/80  but has shown improvement over previous visits. Stress continues to be a factor. Brought in care guide to schedule next appointment.  Will increase nebivolol to '10mg'$  daily. Advised that she could d/c spironolactone however it may need to be restarted if BP increases. Patient voiced understanding.  Follow up in 1 month with Dr Oval Linsey  Continue amlodipine '10mg'$  daily Increase nebivolol to '10mg'$  daily D/c spironolactone (patient preference)    Shirley Bush, PharmD, [place], [place], [place], [place], [place], [address] Phone: [phone], Fax: [phone]

## 2022-04-28 NOTE — Patient Instructions (Signed)
It was good seeing you again  Your blood pressure has improved  We will increase your nebivolol to '10mg'$  once a day. You can take 2 of your '5mg'$  tablets until you run out  Please continue your amlodipine '10mg'$ .  We can discontinue spironolactone at this time but Dr Oval Linsey may want you to restart when you follow up with her  Please call or message with any questions  Karren Cobble, PharmD, [place], [place], [place], [place], [place], [address] Phone: [phone], Fax: [phone]

## 2022-05-01 ENCOUNTER — Ambulatory Visit: Payer: Medicare Other | Attending: Cardiovascular Disease

## 2022-05-01 DIAGNOSIS — Z0000 Encounter for general adult medical examination without abnormal findings: Secondary | ICD-10-CM

## 2022-05-01 NOTE — Progress Notes (Signed)
Appointment Outcome: Completed, Session #: 6                        Start time: 11:00am  End time: 11:27am   Total Mins: 27 minutes  AGREEMENTS SECTION   Overall Goal(s): Stress management Increase physical activity Improve sleep hygiene                                                 Agreement/Action Steps:  Stress management Establish healthy boundaries Practice deep breathing Conduct self-check-ins Implement positive self-talk Engage in daily devotional   Increase physical activity Attend PREP as scheduled   Improve sleep hygiene Speak with provider about sleep disturbances Seek a counselor   Progress Notes:  Patient stated that she has been implementing all of her action steps to manage stress more effectively. Patient reported that although her stress level is 9/10, she feels more irritated with recent events that have occurred. Patient shared that she practices deep breathing and let go of situations she cannot change. Patient stated that instead of being stressed, she is working on establishing healthy boundaries and being more assertive.   Patient mentioned that she is not accepting invitations to engage in something when she is not interested and would normally say yes and feel bad when she is not able to commit. Patient mentioned that she has some [employer] booklets that she is reading through and sharing scriptures with others. Patient stated that this helps her think positive. Patient is using prayer and reading scripture as her version of positive self-talk.  Patient shared that her biggest challenge is providing care for her great-nephew, which has interfered with being able to attend PREP twice weekly. Patient shared that her other challenge to engaging in PREP is not being ready to engage in workouts using the machines. Patient stated that she would walk instead but has not got in the mode of doing so due to lack of motivation. Patient reported that she has to work  on establishing a healthy boundary with her sister regarding the care of her great-nephew.   Patient has not had a conversation due to avoiding conflict. Patient shared that she is thinking positive about the situation verses that she is being used because she does not work. Patient stated that it's hard to say no to her situation when she is not sure if it is a financial or availability issues that causes him not to go to daycare instead. Patient stated that she has started to take time for herself but wants to maximize her time by focusing on herself more.   Patient continues to wait on a call for counseling services since a referral was submitted on 9/11. Patient was referred to a primary care provider in [place] to discuss pain management and issues with sleep.    Indicators of Success and Accountability:  Patient has been able to manage her stress better by implementing her action steps over the past three weeks.  Readiness: Patient is in the action stage of stress management and improving sleep hygiene, and the precontemplation stage of increasing physical activity. Strengths and Supports: Patient is relying on her faith as her strength.  Challenges and Barriers: Patient lacks motivation to engage in physical activity or social activities with others.    Coaching Outcomes: Patient was referred on 10/13  by Karren Cobble to a primary care provider in [place] to discuss pain management and sleep issues.  Patient was referred for behavioral health services in [place] on 9/11 and have not received a call pertaining to starting counseling. Will follow-up on referral to ensure that patient is able to connect with a provider for counseling and medication management.   Patient stated that she will have to work her way up to having a conversation with her sister but is not sure when that will take place.  Patient will continue to implement her action steps over the next month as outlined  above.    Attempted: Fulfilled - Patient implemented all her action steps to manage stress over the past three weeks.   Not met - Patient is waiting on response from new primary care provider and counseling per referrals to address pain management and sleep issues.   Not attempted: Dropped/Revised - Patient determined that she is not ready to use the machines and is challenged to attend due to babysitting her great-nephew. Patient will not be focusing on physical activity moving forward.

## 2022-05-05 ENCOUNTER — Telehealth: Payer: Self-pay

## 2022-05-05 NOTE — Telephone Encounter (Signed)
Patient returning call.  Not sure who called.

## 2022-05-05 NOTE — Telephone Encounter (Signed)
Spoke with patient.

## 2022-05-12 ENCOUNTER — Encounter: Payer: Self-pay | Admitting: Family Medicine

## 2022-05-12 ENCOUNTER — Ambulatory Visit (INDEPENDENT_AMBULATORY_CARE_PROVIDER_SITE_OTHER): Payer: Medicare Other | Admitting: Family Medicine

## 2022-05-12 VITALS — BP 160/80 | HR 69 | Ht 63.0 in | Wt 156.1 lb

## 2022-05-12 DIAGNOSIS — F409 Phobic anxiety disorder, unspecified: Secondary | ICD-10-CM

## 2022-05-12 DIAGNOSIS — I10 Essential (primary) hypertension: Secondary | ICD-10-CM | POA: Diagnosis not present

## 2022-05-12 DIAGNOSIS — F5105 Insomnia due to other mental disorder: Secondary | ICD-10-CM

## 2022-05-12 DIAGNOSIS — M797 Fibromyalgia: Secondary | ICD-10-CM | POA: Diagnosis not present

## 2022-05-12 MED ORDER — GABAPENTIN 100 MG PO CAPS: ORAL_CAPSULE | ORAL | 3 refills | Status: DC

## 2022-05-12 NOTE — Patient Instructions (Signed)
Keep appointment as before, call if you need me sooner  Please check pharmacy for TdAP   Blood pressure is u improving  New for pain is gabapentin 100 mg one to two at bedtime as needed  It is important that you exercise regularly at least 30 minutes 5 times a week. If you develop chest pain, have severe difficulty breathing, or feel very tired, stop exercising immediately and seek medical attention    Thanks for choosing [facility], we consider it a privelige to serve you.

## 2022-05-15 ENCOUNTER — Encounter: Payer: Self-pay | Admitting: Family Medicine

## 2022-05-15 DIAGNOSIS — F409 Phobic anxiety disorder, unspecified: Secondary | ICD-10-CM | POA: Insufficient documentation

## 2022-05-15 NOTE — Assessment & Plan Note (Signed)
improvd bu still not yet at goal, I am encoraged that she will get there! DASH diet and commitment to daily physical activity for a minimum of 30 minutes discussed and encouraged, as a part of hypertension management. The importance of attaining a healthy weight is also discussed.     05/12/2022    4:38 PM 05/12/2022    4:37 PM 05/12/2022    4:23 PM 05/12/2022    4:21 PM 04/28/2022    1:42 PM 04/28/2022    1:41 PM 03/23/2022    1:49 PM  BP/Weight  Systolic BP [phone] 090 [phone]  Diastolic BP 80 82 80 79 75 76 90  Wt. (Lbs)    156.08     BMI    27.65 kg/m2        Mx per [facility]

## 2022-05-15 NOTE — Progress Notes (Signed)
   Shirley Bush     MRN: [id number]      DOB: 21-May-1956   HPI Shirley Bush is here for follow up and re-evaluation of chronic medical conditions, medication management and review of any available recent lab and radiology data.  Preventive health is updated, specifically  Cancer screening and Immunization.   Questions or concerns regarding consultations or procedures which the Shirley Bush has had in the interim are  addressed.[facility]ing [facility] at [facility] Unable to exercise due to uncontrolled pain a generalized from fibromyalgia, and poor sleep is a concern also The Shirley Bush denies any adverse reactions to current medications since the last visit.    ROS Denies recent fever or chills. Denies sinus pressure, nasal congestion, ear pain or sore throat. Denies chest congestion, productive cough or wheezing. Denies chest pains, palpitations and leg swelling Denies abdominal pain, nausea, vomiting,diarrhea or constipation.   Denies dysuria, frequency, hesitancy or incontinence. . Denies headaches, seizures, numbness, or tingling. Denies depression, chronic anxiety and  insomnia. Denies skin break down or rash.   PE  BP (!) 160/80   Pulse 69   Ht '5\' 3"'$  (1.6 m)   Wt 156 lb 1.3 oz (70.8 kg)   SpO2 98%   BMI 27.65 kg/m   Patient alert and oriented and in no cardiopulmonary distress.  HEENT: No facial asymmetry, EOMI,     Neck supple .  Chest: Clear to auscultation bilaterally.  CVS: S1, S2 no murmurs, no S3.Regular rate.  ABD: Soft non tender.   Ext: No edema  MS: Adequate though reduced ROM spine, shoulders, hips and knees.  Skin: Intact, no ulcerations or rash noted.  Psych: Good eye contact, normal affect. Memory intact not anxious or depressed appearing.  CNS: CN 2-12 intact, power,  normal throughout.no focal deficits noted.   Assessment & Plan  Essential hypertension improvd bu still not yet at goal, I am encoraged that she will get there! DASH diet and commitment to daily physical  activity for a minimum of 30 minutes discussed and encouraged, as a part of hypertension management. The importance of attaining a healthy weight is also discussed.     05/12/2022    4:38 PM 05/12/2022    4:37 PM 05/12/2022    4:23 PM 05/12/2022    4:21 PM 04/28/2022    1:42 PM 04/28/2022    1:41 PM 03/23/2022    1:49 PM  BP/Weight  Systolic BP 747 340 370 [phone]  Diastolic BP 80 82 80 79 75 76 90  Wt. (Lbs)    156.08     BMI    27.65 kg/m2        Mx per [facility]  Fibromyalgia Reports uncontrolled generalized pain, start bedtime gabapentin 100 to 200 mg, encouraged stretching exercises, massages and yoga  Insomnia due to anxiety and fear Sleep hygiene reviewed and written information offered also. Prescription sent for  medication needed.

## 2022-05-15 NOTE — Assessment & Plan Note (Addendum)
Reports uncontrolled generalized pain, start bedtime gabapentin 100 to 200 mg, encouraged stretching exercises, massages and yoga

## 2022-05-15 NOTE — Assessment & Plan Note (Signed)
Sleep hygiene reviewed and written information offered also. Prescription sent for  medication needed.  

## 2022-05-26 ENCOUNTER — Ambulatory Visit (INDEPENDENT_AMBULATORY_CARE_PROVIDER_SITE_OTHER): Payer: Medicare Other | Admitting: Family Medicine

## 2022-05-26 ENCOUNTER — Other Ambulatory Visit (HOSPITAL_COMMUNITY)
Admission: RE | Admit: 2022-05-26 | Discharge: 2022-05-26 | Disposition: A | Discharge status: Home / Self Care | Payer: Medicare Other | Source: Ambulatory Visit | Attending: Family Medicine | Admitting: Family Medicine

## 2022-05-26 ENCOUNTER — Encounter: Payer: Self-pay | Admitting: Family Medicine

## 2022-05-26 VITALS — BP 160/82 | HR 64 | Ht 63.0 in | Wt 156.0 lb

## 2022-05-26 DIAGNOSIS — I10 Essential (primary) hypertension: Secondary | ICD-10-CM | POA: Diagnosis not present

## 2022-05-26 DIAGNOSIS — Z1151 Encounter for screening for human papillomavirus (HPV): Secondary | ICD-10-CM | POA: Diagnosis not present

## 2022-05-26 DIAGNOSIS — E785 Hyperlipidemia, unspecified: Secondary | ICD-10-CM | POA: Diagnosis not present

## 2022-05-26 DIAGNOSIS — Z0000 Encounter for general adult medical examination without abnormal findings: Secondary | ICD-10-CM

## 2022-05-26 DIAGNOSIS — Z124 Encounter for screening for malignant neoplasm of cervix: Secondary | ICD-10-CM

## 2022-05-26 DIAGNOSIS — Z0001 Encounter for general adult medical examination with abnormal findings: Secondary | ICD-10-CM

## 2022-05-26 DIAGNOSIS — Z01419 Encounter for gynecological examination (general) (routine) without abnormal findings: Secondary | ICD-10-CM | POA: Insufficient documentation

## 2022-05-26 MED ORDER — UNABLE TO FIND: 0 refills | Status: DC

## 2022-05-26 NOTE — Assessment & Plan Note (Signed)

## 2022-05-26 NOTE — Patient Instructions (Addendum)
F/U in 4 months, call if you need me sooner  Please schedule mammogram at checkout  Nurse please provide script for TdAP  Fasting lipid, cmp and EGFR and TSH 3 to 6 5 days before next visit  It is important that you exercise regularly at least 30 minutes 5 times a week. If you develop chest pain, have severe difficulty breathing, or feel very tired, stop exercising immediately and seek medical attention   Best for Season and 2024!  Thanks for choosing [facility], we consider it a privelige to serve you.

## 2022-05-26 NOTE — Progress Notes (Signed)
    Shirley Bush     MRN: [id number]      DOB: 03/16/1956  HPI: Patient is in for annual physical exam. No other health concerns are expressed or addressed at the visit. Recent labs,  are reviewed. Immunization is reviewed , and  updated if needed.   PE: BP (!) 160/82   Pulse 64   Ht '5\' 3"'$  (1.6 m)   Wt 156 lb (70.8 kg)   SpO2 97%   BMI 27.63 kg/m    Pleasant  female, alert and oriented x 3, in no cardio-pulmonary distress. Afebrile. HEENT No facial trauma or asymetry. Sinuses non tender.  Extra occullar muscles intact.. External ears normal, . Neck: supple, no adenopathy,JVD or thyromegaly.No bruits.  Chest: Clear to ascultation bilaterally.No crackles or wheezes. Non tender to palpation  Breast: No asymetry,no masses or lumps. No tenderness. No nipple discharge or inversion. No axillary or supraclavicular adenopathy  Cardiovascular system; Heart sounds normal,  S1 and  S2 ,no S3.  No murmur, or thrill. Apical beat not displaced Peripheral pulses normal.  Abdomen: Soft, non tender, no organomegaly or masses. No bruits. Bowel sounds normal. No guarding, tenderness or rebound.   GU: External genitalia normal female genitalia , normal female distribution of hair. No lesions. Urethral meatus normal in size, no  Prolapse, no lesions visibly  Present. Bladder non tender. Vagina pink and moist , with no visible lesions , discharge present . Adequate pelvic support no  cystocele or rectocele noted Cervix pink and appears healthy, no lesions or ulcerations noted, no discharge noted from os Uterus normal size, no adnexal masses, no cervical motion or adnexal tenderness.   Musculoskeletal exam: Full ROM of spine, hips , shoulders and knees. No deformity ,swelling or crepitus noted. No muscle wasting or atrophy.   Neurologic: Cranial nerves 2 to 12 intact. Power, tone ,sensation and reflexes normal throughout. No disturbance in gait. No  tremor.  Skin: Intact, no ulceration, erythema , scaling or rash noted. Pigmentation normal throughout  Psych; Normal mood and affect. Judgement and concentration normal   Assessment & Plan:  Encounter for annual physical exam Annual exam as documented. Counseling done  re healthy lifestyle involving commitment to 150 minutes exercise per week, heart healthy diet, and attaining healthy weight.The importance of adequate sleep also discussed. Regular seat belt use and home safety, is also discussed. Changes in health habits are decided on by the patient with goals and time frames  set for achieving them. Immunization and cancer screening needs are specifically addressed at this visit.

## 2022-05-31 LAB — CYTOLOGY - PAP
Comment: NEGATIVE
Diagnosis: UNDETERMINED — AB
High risk HPV: NEGATIVE

## 2022-06-01 ENCOUNTER — Ambulatory Visit: Payer: Medicare Other

## 2022-06-01 DIAGNOSIS — Z0000 Encounter for general adult medical examination without abnormal findings: Secondary | ICD-10-CM

## 2022-06-01 NOTE — Progress Notes (Signed)
Appointment Outcome: Completed, Session #: [id number]                         Start time: 11:00am   End time: 11:24am   Total Mins: 24 minutes  AGREEMENTS SECTION   Overall Goal(s): Stress management Increase physical activity Improve sleep hygiene                                                 Agreement/Action Steps:  Stress management Establish healthy boundaries Practice deep breathing Conduct self-check-ins Implement positive self-talk Engage in daily devotional   Increase physical activity Attend PREP as scheduled   Improve sleep hygiene Speak with provider about sleep disturbances Seek a counselor    Progress Notes:  Patient rated her stress level a 10/10 today. Patient shared that she has felt this way most of the past month. Patient stated that she try to deal with her stressors by praying and engaging in her daily devotional. Patient mentioned that she has engaged in deep breathing and conducting self-check-ins to help her calm down and avoid acting out. Patient stated that she implement positive self-talk with scripture and encouraging herself. Patient shared that this helps her keep her sanity and exercise patience with others. Patient   Patient shared her current stressors including pain management, household repairs, and no flexibility in her schedule due to babysitting, and lack of quality sleep. Patient stated that she is working to find a solution to her stressors, but have not made much progress without receiving the support she needs.   Patient has not been able to see a counselor after being referred and was not contacted for scheduling. Patient has not been able to focus on her sleep disturbances due to a primary focus on pain management by providers. Patient expressed that if she was able to get adequate sleep then she would be able to function better.   Patient stated that she feels as though she is content with her current medications and feel settled in  her mind and body despite the stress. Patient stated that gabapentin helps to decrease her pain level, but it has not help with improving her sleep although she takes it in the early evening. Patient explained that this medication does make her dizzy. Patient reported that she still take caffeine pills to gain energy to be alert to babysit.   Patient was not able to continue participating in PREP due to competing priorities and an increase in fibromyalgia pain while exercising.   Indicators of Success and Accountability:  Patient was able implement all her action steps to manage stress  Readiness: Patient is in the action stage of stress management. Patient is in the precontemplation stage of increasing her physical activity and improving her sleep hygiene.  Strengths and Supports: Patient is relying on her faith and being patience to manage stress. Patient does not have additional supports at this time.  Challenges and Barriers: Patient is experiencing multiple stressors with no support.    Coaching Outcomes: Patient will continue to implement her action steps as outlined below over the next three months with the focus on stress management only.   Overall Goal(s): Stress management  Agreement/Action Steps:  Stress management Establish healthy boundaries Practice deep breathing Conduct self-check-ins Implement positive self-talk Engage in daily devotional Seek a counselor   Attempted: Fulfilled - Patient has engaged in all her action steps to manage stress over the past month.   Not met - Patient has not been able to speak with provider about sleep disturbances due to focus on pain management. Patient stop attending PREP. Patient's referral for counseling was not scheduled due to being discharged from [facility] and request to refer to another provider.    Not Attempted: Dropped/Revised- Patient was not able to attend PREP as scheduled due to  conflicting priorities.

## 2022-06-14 NOTE — Progress Notes (Signed)
[facility] Follow-up:    Date:  06/15/2022   ID:  Shirley Bush, DOB October 29, 1955, MRN [id number]  PCP:  Fayrene Helper, MD  Cardiologist:  None  Nephrologist:  Referring MD: Fayrene Helper, MD   CC: Hypertension  History of Present Illness:    Shirley Bush is a 66 y.o. female with a hx of hypertension, hyperlipidemia, and anxiety, here for follow-up. She was initially seen 01/23/2022 in the [facility]. She saw her PCP 12/2021 and her blood pressure was 172/94. Her blood pressure has been difficult to manage for years. At that appointment, amlodipine was increased to 10 mg and HCTZ 12.5 mg was added. She was encouraged to exercise and follow the DASH diet. She has struggled with medication intolerance. She previously saw Dr. Johnsie Cancel for chest pain. Her symptoms were thought to be related to uncontrolled hypertension and possibly indigestion. She had a nuclear stress test 02/2021 that was negative for ischemia. She only achieved 4.6 METS on a Bruce protocol. Echo at that time revealed LVEF 60-65% with normal diastolic function.   At her last visit, she confirmed struggling with hypertension since her 39's in the setting of a work injury that could have been life-threatening. Home blood pressures were 150-160/80-90 on average, so spironolactone 25 mg daily was added and she was enrolled in the [place] RPM system. She also complained of occasional chest pain that woke her up at night. It was recommended to limit her caffeine and sodium intake. Her potassium supplement was discontinued. She was referred to PREP. On 02/23/22 she saw our pharmacist and her BP was 148/84. She reported being under significant stress at home. She was given a sample course of Edarbi to see if she would tolerate an ARB. She discontinued Edarbi after about 2 weeks due to feeling poorly. It also didn't seem to be very effective. At her follow-up 03/23/22 she brought home readings  with mostly high 140s to low 150s over 80s/90s. She was started on 5 mg nebivolol which she was able to tolerate. Her blood pressures decreased to 702O-378H systolic. At her most recent follow-up 04/28/22 she also asked to discontinue spironolactone due to pill burden, which was agreeable. She was advised it may need to be restarted if her BP increased. Nebivolol was increased to 10 mg daily.   Today, she reports home blood pressures averaging 130s-140s/70s-80s, with no readings higher than 160/80. She admits to not checking her blood pressure as frequently as before. In clinic today her BP was initially 147/82, but improved to 136/76 on recheck. Also she complains of feeling like her skin is drying out, which she attributes to spironolactone as this occurred in the past while on spironolactone previously. Generally she feels that stress continues to be a major contributor to her hypertension. She has been struggling with finding a specialist for her depression. Lately she is not formally exercising. She denies any palpitations, chest pain, shortness of breath, or peripheral edema. No lightheadedness, headaches, syncope, orthopnea, or PND.  Previous antihypertensives: Lisinopril - rash Maxide Amlodipine - swelling Hydralazine HCTZ - rash   Past Medical History:  Diagnosis Date   Allergy Don't remember   Don't remember   Anxiety    Arthritis In 2007   In both of my shoulders and my knees   Depression 2002   Depression    Phreesia 06/28/2020   Depression    Phreesia 07/23/2020   Fibromyalgia    GERD (gastroesophageal reflux disease) 01/23/2022  Heart murmur Don't remember   I was in 20s   Hypertension 2004   Medial meniscus tear 04/2013   left   Migraines     Past Surgical History:  Procedure Laterality Date   [place]   CESAREAN SECTION N/A    Phreesia 06/28/2020   COLONOSCOPY  02/05/2009   COLONOSCOPY N/A  03/06/2014   Procedure: COLONOSCOPY;  Surgeon: Danie Binder, MD;  [facility];  Service: Endoscopy;  Laterality: N/A;  10:30 AM   COLONOSCOPY WITH PROPOFOL N/A 01/14/2019   Surgeon: Danie Binder, MD;  one 3 mm tubular adenoma in the mid ascending colon, moderate diverticulosis in entire colon, external and internal hemorrhoids, tortuous left colon. Recommended repeat in 5 years.    FINGER SURGERY Right    long finger   HERNIA REPAIR N/A    Phreesia 06/28/2020   JOINT REPLACEMENT N/A    Phreesia 06/28/2020   KNEE ARTHROSCOPY WITH MEDIAL MENISECTOMY Left 04/24/2013   Procedure: LEFT KNEE ARTHROSCOPY CHONDROPLASTY WITH MEDIAL MENISECTOMY;  Surgeon: Ninetta Lights, MD;  Location: [place] [facility];  Service: Orthopedics;  Laterality: Left;   SHOULDER ARTHROSCOPY Left 2009   SHOULDER ARTHROSCOPY Right    SHOULDER ARTHROSCOPY WITH ROTATOR CUFF REPAIR Left 11/25/2004   TOTAL KNEE ARTHROPLASTY Left 11/11/2014   Procedure: LEFT TOTAL KNEE ARTHROPLASTY;  Surgeon: Kathryne Hitch, MD;  Location: [place];  Service: Orthopedics;  Laterality: Left;   TUBAL LIGATION  8099   UMBILICAL HERNIA REPAIR  2008    Current Medications: Current Meds  Medication Sig   amLODipine (NORVASC) 10 MG tablet Take 1 tablet (10 mg total) by mouth daily.   diphenhydrAMINE (BENADRYL) 25 mg capsule Take 25 mg by mouth every 6 (six) hours as needed.   Evolocumab (REPATHA SURECLICK) 833 MG/ML SOAJ Inject 140 mg into the skin every 14 (fourteen) days.   gabapentin (NEURONTIN) 100 MG capsule Take one to two capsules at bedtime for pain   ibuprofen (ADVIL) 200 MG tablet Take 200 mg by mouth every 6 (six) hours as needed.   Misc Natural Products (AIRBORNE ELDERBERRY) CHEW Chew 1 each by mouth daily.   Multiple Vitamins-Minerals (HAIR SKIN AND NAILS FORMULA) TABS Take 3 tablets by mouth daily.   nebivolol (BYSTOLIC) 10 MG tablet Take 1 tablet (10 mg total) by mouth daily.   Specialty Vitamins Products  (COLLAGEN ULTRA) CAPS Take 1 each by mouth daily.   spironolactone (ALDACTONE) 25 MG tablet Take 25 mg by mouth daily.   UNABLE TO FIND TDAP Vaccine DX: Z23     Allergies:   Lisinopril, Maxzide [hydrochlorothiazide w-triamterene], Other, Buspar [buspirone], Beano meltaways [alpha-d-galactosidase], Crestor [rosuvastatin calcium], Hydrochlorothiazide, Paxil [paroxetine], and Simethicone   Social History   Socioeconomic History   Marital status: Divorced    Spouse name: Not on file   Number of children: 3   Years of education: 12 grade    Highest education level: 12th grade  Occupational History   Occupation: disability   Tobacco Use   Smoking status: Never   Smokeless tobacco: Never  Vaping Use   Vaping Use: Never used  Substance and Sexual Activity   Alcohol use: Not Currently    Comment: occasionally- rarely.    Drug use: No   Sexual activity: Not Currently    Birth control/protection: Abstinence, None  Other Topics Concern   Not on file  Social History Narrative  Son is living with her at the moment, patient states that she is isolating in her home and is depressed more lately    Social Determinants of Health   Financial Resource Strain: Low Risk  (09/23/2021)   Overall Financial Resource Strain (CARDIA)    Difficulty of Paying Living Expenses: Not hard at all  Food Insecurity: No Food Insecurity (07/22/2018)   Hunger Vital Sign    Worried About Running Out of Food in the Last Year: Never true    [place] in the Last Year: Never true  Transportation Needs: No Transportation Needs (07/26/2020)   PRAPARE - Hydrologist (Medical): No    Lack of Transportation (Non-Medical): No  Physical Activity: Inactive (07/26/2020)   Exercise Vital Sign    Days of Exercise per Week: 0 days    Minutes of Exercise per Session: 0 min  Stress: Stress Concern Present (09/23/2021)   [place]     Feeling of Stress : To some extent  Social Connections: Socially Isolated (07/26/2020)   Social Connection and Isolation Panel [NHANES]    Frequency of Communication with Friends and Family: Twice a week    Frequency of Social Gatherings with Friends and Family: Once a week    Attends Religious Services: Never    Marine scientist or Organizations: No    Attends Archivist Meetings: Never    Marital Status: Widowed     Family History: The patient's family history includes Alcohol abuse in her father; Cancer in her maternal aunt, sister, and sister; Colon cancer in her sister; Heart attack in her maternal grandmother; Hypertension in her father, mother, and sister; Migraines in her sister; Seizures in her father; Thyroid disease in her sister and sister.  ROS:   Please see the history of present illness.    (+) Dry skin (+) Depression (+) Stress All other systems reviewed and are negative.  EKGs/Labs/Other Studies Reviewed:    Echocardiogram  02/16/2021:  1. Left ventricular ejection fraction, by estimation, is 60 to 65%. The  left ventricle has normal function. The left ventricle has no regional  wall motion abnormalities. Left ventricular diastolic parameters were  normal.   2. Right ventricular systolic function is normal. The right ventricular  size is normal. Tricuspid regurgitation signal is inadequate for assessing  PA pressure.   3. There is a trivial pericardial effusion posterior to the left  ventricle.   4. The mitral valve is grossly normal. Mild mitral valve regurgitation.   5. The aortic valve is tricuspid. Aortic valve regurgitation is trivial.  Aortic valve mean gradient measures 6.0 mmHg. No aortic stenosis.   6. The inferior vena cava is normal in size with greater than 50%  respiratory variability, suggesting right atrial pressure of 3 mmHg.   Comparison(s): No prior Echocardiogram.   Nuclear Stress Myoview  02/16/2021: Patient exercised for 3  minutes achieving a maximum workload of 4.6 METS and MPHR 95%. No chest pain reported, exercise was limited by knee pain. There were no diagnostic ST segment changes to indicate ischemia. No significant arrhythmias. Calculated Duke treadmill score of 3 is intermediate risk, mainly based on exercise time alone. Blood pressure demonstrated a normal response to exercise. No significant myocardial perfusion defects to indicate scar or ischemia. This is a low risk study based on normal perfusion. Nuclear stress EF: 74%.   EKG:  EKG is personally reviewed. 06/15/2022:  EKG  was not ordered. 01/23/2022: Sinus rhythm. Rate 100 bpm.  Recent Labs: 01/30/2022: ALT 17; Hemoglobin 13.5; Platelets 426; TSH 5.010 02/03/2022: BUN 9; Creatinine, Ser 0.88; Potassium 4.9; Sodium 137   Recent Lipid Panel    Component Value Date/Time   CHOL 223 (H) 01/30/2022 0827   TRIG 85 01/30/2022 0827   HDL 61 01/30/2022 0827   CHOLHDL 3.7 01/30/2022 0827   CHOLHDL 4.4 10/03/2019 0809   VLDL 20 10/07/2015 0808   LDLCALC 147 (H) 01/30/2022 0827   LDLCALC 148 (H) 10/03/2019 0809    Physical Exam:    VS:  BP 136/76 (BP Location: Left Arm, Patient Position: Sitting, Cuff Size: Normal)   Pulse 73   Ht '5\' 3"'$  (1.6 m)   Wt 155 lb 11.2 oz (70.6 kg)   SpO2 100%   BMI 27.58 kg/m  , BMI Body mass index is 27.58 kg/m. GENERAL:  Anxious appearing.   HEENT: Pupils equal round and reactive, fundi not visualized, oral mucosa unremarkable NECK:  No jugular venous distention, waveform within normal limits, carotid upstroke brisk and symmetric, no bruits, no thyromegaly LUNGS:  Clear to auscultation bilaterally HEART:  RRR.  PMI not displaced or sustained,S1 and S2 within normal limits, no S3, no S4, no clicks, no rubs, no murmurs ABD:  Flat, positive bowel sounds normal in frequency in pitch, no bruits, no rebound, no guarding, no midline pulsatile mass, no hepatomegaly, no splenomegaly EXT:  2 plus pulses throughout, no edema,  no cyanosis no clubbing SKIN:  No rashes no nodules NEURO:  Cranial nerves II through XII grossly intact, motor grossly intact throughout PSYCH:  Cognitively intact, oriented to person place and time   ASSESSMENT/PLAN:    Essential hypertension BP remains uncontrolled but is better.  She has intolerance to multiple medications.  She has skin dryness with spironolactone.  We discussed doxazosin and clonidine as other potential options and she prefers to continue her current medications.  She also struggles with stress, anxiety and depression which are likely contributing.  She isn't interested in any therapy or medication at this time.  Encouraged her to work on increasing her exercise.  Her goal is at least 150 minutes weekly.  Continue with spironolactone and nebivolol  She is also going to think about whether she would consider renal denervation.  Hyperlipidemia Lipids are quite elevated and she has not tolerated statins.  She had aortic atherosclerosis on CT.  We will give her a sample of Repatha.  If she tolerates it we will go through the authorization process.     Screening for Secondary Hypertension:     01/23/2022    1:52 PM  Causes  Drugs/Herbals Screened     - Comments Limits sodium intake.  Caffeine tablet and coffee.  Some NSAIDS  Sleep Apnea Screened     - Comments snores but no apnea  Thyroid Disease Screened  Hyperaldosteronism N/A  Pheochromocytoma N/A  Cushing's Syndrome N/A  Hyperparathyroidism N/A  Coarctation of the Aorta Screened     - Comments BP symmetric  Compliance Screened    Relevant Labs/Studies:    Latest Ref Rng & Units 02/03/2022    9:09 AM 01/30/2022    8:27 AM 01/24/2021    9:16 AM  Basic Labs  Sodium 134 - 144 mmol/L 137  138  141   Potassium 3.5 - 5.2 mmol/L 4.9  4.5  4.5   Creatinine 0.57 - 1.00 mg/dL 0.88  0.91  0.81  Latest Ref Rng & Units 01/30/2022    8:27 AM 01/24/2021    9:16 AM  Thyroid   TSH 0.450 - 4.500 uIU/mL 5.010   3.930                  Disposition:    FU with Sarajean Dessert C. Oval Linsey, MD, [facility] in 3 months.  Medication Adjustments/Labs and Tests Ordered: Current medicines are reviewed at length with the patient today.  Concerns regarding medicines are outlined above.   No orders of the defined types were placed in this encounter.  No orders of the defined types were placed in this encounter.  I,Mathew Stumpf,acting as a Education administrator for Skeet Latch, MD.,have documented all relevant documentation on the behalf of Skeet Latch, MD,as directed by  Skeet Latch, MD while in the presence of Skeet Latch, MD.  I, [place] Oval Linsey, MD have reviewed all documentation for this visit.  The documentation of the exam, diagnosis, procedures, and orders on 06/15/2022 are all accurate and complete.  Signed, Skeet Latch, MD  06/15/2022 4:58 PM    [place]

## 2022-06-15 ENCOUNTER — Encounter (HOSPITAL_BASED_OUTPATIENT_CLINIC_OR_DEPARTMENT_OTHER): Payer: Self-pay | Admitting: Cardiovascular Disease

## 2022-06-15 ENCOUNTER — Telehealth (HOSPITAL_BASED_OUTPATIENT_CLINIC_OR_DEPARTMENT_OTHER): Payer: Self-pay | Admitting: *Deleted

## 2022-06-15 ENCOUNTER — Ambulatory Visit (HOSPITAL_BASED_OUTPATIENT_CLINIC_OR_DEPARTMENT_OTHER): Payer: Medicare Other | Admitting: Cardiovascular Disease

## 2022-06-15 VITALS — BP 136/76 | HR 73 | Ht 63.0 in | Wt 155.7 lb

## 2022-06-15 DIAGNOSIS — I10 Essential (primary) hypertension: Secondary | ICD-10-CM

## 2022-06-15 DIAGNOSIS — E785 Hyperlipidemia, unspecified: Secondary | ICD-10-CM | POA: Diagnosis not present

## 2022-06-15 NOTE — Telephone Encounter (Signed)
Repatha sureclick !140 mg #1 given to patient at office visit Lot [id number] exp 10/14/2024

## 2022-06-15 NOTE — Assessment & Plan Note (Signed)
Lipids are quite elevated and she has not tolerated statins.  She had aortic atherosclerosis on CT.  We will give her a sample of Repatha.  If she tolerates it we will go through the authorization process.

## 2022-06-15 NOTE — Assessment & Plan Note (Signed)
BP remains uncontrolled but is better.  She has intolerance to multiple medications.  She has skin dryness with spironolactone.  We discussed doxazosin and clonidine as other potential options and she prefers to continue her current medications.  She also struggles with stress, anxiety and depression which are likely contributing.  She isn't interested in any therapy or medication at this time.  Encouraged her to work on increasing her exercise.  Her goal is at least 150 minutes weekly.  Continue with spironolactone and nebivolol  She is also going to think about whether she would consider renal denervation.

## 2022-06-15 NOTE — Patient Instructions (Addendum)
Medication Instructions:  REPATHA EVERY 14 DAYS CALL THE OFFICE OR SEND MYCHART MESSAGE ON HOW YOU TOLERATE   Labwork: NONE  Testing/Procedures: NONE  Follow-Up: 09/04/2022 2:00 pm with Dr Oval Linsey    Any Other Special Instructions Will Be Listed Below (If Applicable).  Consider Renal Denervation as a procedure that can help control your blood pressure without requiring so much medication.

## 2022-06-16 ENCOUNTER — Telehealth: Payer: Self-pay | Admitting: Licensed Clinical Social Worker

## 2022-06-16 NOTE — Telephone Encounter (Signed)
H&V Care Navigation CSW Progress Note  Clinical Social Worker contacted patient by phone to f/u on referral for assistance with energy bills. LCSW attempted pt x2 today, call was unable to be completed- unable to leave message. Pt may be eligible for LIEAP which opens today 12/1. LCSW will mail pt information about this and my card. I will reattempt pt as able.   Patient is participating in a Managed Medicaid Plan:  No, UHC Medicare  [place]: No Food Insecurity (07/22/2018)  Housing: Medium Risk (07/26/2020)  Transportation Needs: No Transportation Needs (07/26/2020)  Alcohol Screen: Low Risk  (07/26/2020)  Depression (PHQ2-9): High Risk (05/12/2022)  Financial Resource Strain: Low Risk  (09/23/2021)  Physical Activity: Inactive (07/26/2020)  Social Connections: Socially Isolated (07/26/2020)  Stress: Stress Concern Present (09/23/2021)  Tobacco Use: Low Risk  (06/15/2022)    Westley Hummer, MSW, LCSW Clinical Social Worker [place]  [phone]- work cell phone (preferred) [phone]- desk phone

## 2022-06-22 ENCOUNTER — Telehealth: Payer: Self-pay | Admitting: Licensed Clinical Social Worker

## 2022-06-22 NOTE — Telephone Encounter (Signed)
H&V Care Navigation CSW Progress Note  Clinical Social Worker contacted patient by phone to f/u on referral for assistance with energy bills; recommendation to look into LIEAP. Call was unable to be completed- unable to leave message. I will reattempt pt as able.    Patient is participating in a Managed Medicaid Plan:  No, UHC Medicare  [place]: No Food Insecurity (07/22/2018)  Housing: Medium Risk (07/26/2020)  Transportation Needs: No Transportation Needs (07/26/2020)  Alcohol Screen: Low Risk  (07/26/2020)  Depression (PHQ2-9): High Risk (05/12/2022)  Financial Resource Strain: Low Risk  (09/23/2021)  Physical Activity: Inactive (07/26/2020)  Social Connections: Socially Isolated (07/26/2020)  Stress: Stress Concern Present (09/23/2021)  Tobacco Use: Low Risk  (06/15/2022)    Westley Hummer, MSW, LCSW Clinical Social Worker [place]  [phone]- work cell phone (preferred) [phone]- desk phone

## 2022-06-26 ENCOUNTER — Telehealth: Payer: Self-pay | Admitting: Licensed Clinical Social Worker

## 2022-06-26 NOTE — Addendum Note (Signed)
Addended by: Skeet Latch C on: 06/26/2022 04:02 PM   Modules accepted: Orders

## 2022-06-26 NOTE — Telephone Encounter (Signed)
H&V Care Navigation CSW Progress Note  Clinical Social Worker contacted patient by phone to f/u on referral for assistance with energy bills; recommendation to look into LIEAP. I was able to reach pt this afternoon at [phone]. She shares that they have had a lot going on as a family because her mother has been in and out of the hospital and recently had her gallbladder removed. She has gathered all documents- again encouraged her to go DSS, if they are unable to assist then we can assess for assistance through [facility] [place]. Pt states understanding- has my card if needed to contact me back. I will f/u next week if I do not hear back from pt.    Patient is participating in a Managed Medicaid Plan:  No, UHC Medicare  [place]: No Food Insecurity (07/22/2018)  Housing: Medium Risk (07/26/2020)  Transportation Needs: No Transportation Needs (07/26/2020)  Alcohol Screen: Low Risk  (07/26/2020)  Depression (PHQ2-9): High Risk (05/12/2022)  Financial Resource Strain: Low Risk  (09/23/2021)  Physical Activity: Inactive (07/26/2020)  Social Connections: Socially Isolated (07/26/2020)  Stress: Stress Concern Present (09/23/2021)  Tobacco Use: Low Risk  (06/15/2022)   Westley Hummer, MSW, LCSW Clinical Social Worker [place]  [phone]- work cell phone (preferred) [phone]- desk phone

## 2022-07-05 ENCOUNTER — Telehealth: Payer: Self-pay | Admitting: Licensed Clinical Social Worker

## 2022-07-05 NOTE — Telephone Encounter (Signed)
H&V Care Navigation CSW Progress Note   Clinical Social Worker contacted patient by phone to f/u on referral for assistance with energy bills; recommendation to look into LIEAP. Call was unable to be completed- unable to leave message. I remain available should pt reach out for assistance- offered assistance should she not be eligible for LIEAP assistance with bills but have had minimal engagement from pt. Complicated by the fact that she has had family challenges and does not have a voicemail set up at this time/something that I can leave messages on. Will not actively follow at this time., remain available.      Patient is participating in a Managed Medicaid Plan:  No, UHC Medicare  [place]: No Food Insecurity (07/22/2018)  Housing: Medium Risk (07/26/2020)  Transportation Needs: No Transportation Needs (07/26/2020)  Alcohol Screen: Low Risk  (07/26/2020)  Depression (PHQ2-9): High Risk (05/12/2022)  Financial Resource Strain: Low Risk  (09/23/2021)  Physical Activity: Inactive (07/26/2020)  Social Connections: Socially Isolated (07/26/2020)  Stress: Stress Concern Present (09/23/2021)  Tobacco Use: Low Risk  (06/15/2022)   Westley Hummer, MSW, LCSW Clinical Social Worker [place]  [phone]- work cell phone (preferred) [phone]- desk phone

## 2022-07-05 NOTE — Telephone Encounter (Signed)
Additional phone note opened in error.   Shandon Burlingame, MSW, LCSW Clinical Social Worker II  Heart/Vascular Care Navigation  [phone]- work cell phone (preferred) [phone]- desk phone  

## 2022-07-24 ENCOUNTER — Ambulatory Visit (HOSPITAL_COMMUNITY)
Admission: RE | Admit: 2022-07-24 | Discharge: 2022-07-24 | Disposition: A | Discharge status: Home / Self Care | Payer: Medicare Other | Source: Ambulatory Visit | Attending: Family Medicine | Admitting: Family Medicine

## 2022-07-24 DIAGNOSIS — Z1231 Encounter for screening mammogram for malignant neoplasm of breast: Secondary | ICD-10-CM | POA: Diagnosis not present

## 2022-08-07 ENCOUNTER — Ambulatory Visit (INDEPENDENT_AMBULATORY_CARE_PROVIDER_SITE_OTHER): Payer: Medicare Other

## 2022-08-07 VITALS — BP 148/82 | HR 73 | Ht 63.0 in | Wt 153.0 lb

## 2022-08-07 DIAGNOSIS — Z0000 Encounter for general adult medical examination without abnormal findings: Secondary | ICD-10-CM

## 2022-08-07 NOTE — Patient Instructions (Signed)
  Shirley Bush , Thank you for taking time to come for your Medicare Wellness Visit. I appreciate your ongoing commitment to your health goals. Please review the following plan we discussed and let me know if I can assist you in the future.   These are the goals we discussed:  Goals       Activity and Exercise Increased      Evidence-based guidance:  Review current exercise levels.  Assess patient perspective on exercise or activity level, barriers to increasing activity, motivation and readiness for change.  Recommend or set healthy exercise goal based on individual tolerance.  Encourage small steps toward making change in amount of exercise or activity.  Urge reduction of sedentary activities or screen time.  Promote group activities within the community or with family or support person.  Consider referral to rehabiliation therapist for assessment and exercise/activity plan.   Notes: Wants to start exercising.       Anxiety Symptoms Monitored and Managed. Manage Depression symptoms (pt-stated)      Time Frame:  Short Term Goal Priority:  High Progress:  Not On Track  Start Date:  09/23/21 End Date: 12/21/21  Follow Up Date:  10/28/21 at 1:30 PM  Anxiety Symptoms Monitored and Managed. Manage Depression issues  Patient Coping Strengths:  Attends medical appointments Takes medications as prescribed Has support from her daughter  Patient Self Care Deficits:  Financial challenges Pain issues Hair loss issues Depression issues  Patient Goals:  - spend time or talk with others at least 2 to 3 times per week - practice relaxation or meditation daily - keep a calendar with appointment dates  Follow Up Plan: LCSW to call client on 10/28/21 at 1:30 PM to discuss client needs       LIFESTYLE - STRESS MANAGEMENT      Establish healthy boundaries Practice deep breathing Conduct self-check-ins Implement positive self-talk Engage in daily devotional Speak with provider about sleep  disturbances Seek a counselor      Patient Stated      Get referral to get hearing checked         This is a list of the screening recommended for you and due dates:  Health Maintenance  Topic Date Due   DTaP/Tdap/Td vaccine (3 - Td or Tdap) 02/09/2021   COVID-19 Vaccine (7 - 2023-24 season) 06/23/2022   Zoster (Shingles) Vaccine (1 of 2) 05/10/2023*   Medicare Annual Wellness Visit  08/08/2023   Mammogram  07/24/2024   Pap Smear  05/26/2025   Colon Cancer Screening  01/13/2029   Flu Shot  Completed   Hepatitis C Screening: USPSTF Recommendation to screen - Ages 18-79 yo.  Completed   HIV Screening  Completed   HPV Vaccine  Aged Out  *Topic was postponed. The date shown is not the original due date.

## 2022-08-07 NOTE — Progress Notes (Signed)
Subjective:   Shirley Bush is a 67 y.o. female who presents for Medicare Annual (Subsequent) preventive examination.  Review of Systems           Objective:    There were no vitals filed for this visit. There is no height or weight on file to calculate BMI.     10/14/2021    1:10 PM 08/01/2021    9:32 AM 07/26/2020   10:34 AM 01/14/2019    9:01 AM 07/22/2018    8:46 AM 04/05/2018    8:27 AM 11/11/2014    6:19 PM  Advanced Directives  Does Patient Have a Medical Advance Directive? No No No No No  No  Would patient like information on creating a medical advance directive? No - Patient declined Yes (ED - Information included in AVS) No - Patient declined No - Patient declined Yes (ED - Information included in AVS)  Yes - Educational materials given     Information is confidential and restricted. Go to Review Flowsheets to unlock data.     Current Medications (verified) Outpatient Encounter Medications as of 08/07/2022  Medication Sig   amLODipine (NORVASC) 10 MG tablet Take 1 tablet (10 mg total) by mouth daily.   diphenhydrAMINE (BENADRYL) 25 mg capsule Take 25 mg by mouth every 6 (six) hours as needed.   Evolocumab (REPATHA SURECLICK) 725 MG/ML SOAJ Inject 140 mg into the skin every 14 (fourteen) days.   gabapentin (NEURONTIN) 100 MG capsule Take one to two capsules at bedtime for pain   ibuprofen (ADVIL) 200 MG tablet Take 200 mg by mouth every 6 (six) hours as needed.   Misc Natural Products (AIRBORNE ELDERBERRY) CHEW Chew 1 each by mouth daily.   Multiple Vitamins-Minerals (HAIR SKIN AND NAILS FORMULA) TABS Take 3 tablets by mouth daily.   nebivolol (BYSTOLIC) 10 MG tablet Take 1 tablet (10 mg total) by mouth daily.   Specialty Vitamins Products (COLLAGEN ULTRA) CAPS Take 1 each by mouth daily.   spironolactone (ALDACTONE) 25 MG tablet Take 25 mg by mouth daily.   UNABLE TO FIND TDAP Vaccine DX: Z23   No facility-administered encounter medications on file as of  08/07/2022.    Allergies (verified) Lisinopril, Maxzide [hydrochlorothiazide w-triamterene], Other, Buspar [buspirone], Beano meltaways [alpha-d-galactosidase], Crestor [rosuvastatin calcium], Hydrochlorothiazide, Paxil [paroxetine], and Simethicone   History: Past Medical History:  Diagnosis Date   Allergy Don't remember   Don't remember   Anxiety    Arthritis In 2007   In both of my shoulders and my knees   Depression 2002   Depression    Phreesia 06/28/2020   Depression    Phreesia 07/23/2020   Fibromyalgia    GERD (gastroesophageal reflux disease) 01/23/2022   Heart murmur Don't remember   I was in 20s   Hypertension 2004   Medial meniscus tear 04/2013   left   Migraines    Past Surgical History:  Procedure Laterality Date   [place] N/A    Phreesia 06/28/2020   COLONOSCOPY  02/05/2009   COLONOSCOPY N/A 03/06/2014   Procedure: COLONOSCOPY;  Surgeon: Danie Binder, MD;  [facility];  Service: Endoscopy;  Laterality: N/A;  10:30 AM   COLONOSCOPY WITH PROPOFOL N/A 01/14/2019   Surgeon: Danie Binder, MD;  one 3 mm tubular adenoma in the mid ascending colon, moderate diverticulosis in entire colon, external and internal hemorrhoids,  tortuous left colon. Recommended repeat in 5 years.    FINGER SURGERY Right    long finger   HERNIA REPAIR N/A    Phreesia 06/28/2020   JOINT REPLACEMENT N/A    Phreesia 06/28/2020   KNEE ARTHROSCOPY WITH MEDIAL MENISECTOMY Left 04/24/2013   Procedure: LEFT KNEE ARTHROSCOPY CHONDROPLASTY WITH MEDIAL MENISECTOMY;  Surgeon: Ninetta Lights, MD;  Location: [facility];  Service: Orthopedics;  Laterality: Left;   SHOULDER ARTHROSCOPY Left 2009   SHOULDER ARTHROSCOPY Right    SHOULDER ARTHROSCOPY WITH ROTATOR CUFF REPAIR Left 11/25/2004   TOTAL KNEE ARTHROPLASTY Left 11/11/2014   Procedure: LEFT TOTAL KNEE ARTHROPLASTY;  Surgeon:  Kathryne Hitch, MD;  Location: [facility];  Service: Orthopedics;  Laterality: Left;   TUBAL LIGATION  6761   UMBILICAL HERNIA REPAIR  2008   Family History  Problem Relation Age of Onset   Hypertension Mother    Hypertension Father    Alcohol abuse Father    Seizures Father    Hypertension Sister    Migraines Sister    Thyroid disease Sister    Colon cancer Sister        diagnosed in late 76s.    Thyroid disease Sister    Cancer Sister        breast    Cancer Sister    Cancer Maternal Aunt        gyne cancer   Heart attack Maternal Grandmother    Social History   Socioeconomic History   Marital status: Divorced    Spouse name: Not on file   Number of children: 3   Years of education: 12 grade    Highest education level: 12th grade  Occupational History   Occupation: disability   Tobacco Use   Smoking status: Never   Smokeless tobacco: Never  Vaping Use   Vaping Use: Never used  Substance and Sexual Activity   Alcohol use: Not Currently    Comment: occasionally- rarely.    Drug use: No   Sexual activity: Not Currently    Birth control/protection: Abstinence, None  Other Topics Concern   Not on file  Social History Narrative   Son is living with her at the moment, patient states that she is isolating in her home and is depressed more lately    Social Determinants of Health   Financial Resource Strain: Low Risk  (09/23/2021)   Overall Financial Resource Strain (CARDIA)    Difficulty of Paying Living Expenses: Not hard at all  Food Insecurity: No Food Insecurity (07/22/2018)   Hunger Vital Sign    Worried About Running Out of Food in the Last Year: Never true    [place] in the Last Year: Never true  Transportation Needs: No Transportation Needs (07/26/2020)   PRAPARE - Hydrologist (Medical): No    Lack of Transportation (Non-Medical): No  Physical Activity: Inactive (07/26/2020)   Exercise Vital Sign    Days of Exercise per Week:  0 days    Minutes of Exercise per Session: 0 min  Stress: Stress Concern Present (09/23/2021)   [place]    Feeling of Stress : To some extent  Social Connections: Socially Isolated (07/26/2020)   Social Connection and Isolation Panel [NHANES]    Frequency of Communication with Friends and Family: Twice a week    Frequency of Social Gatherings with Friends and Family: Once a week  Attends Religious Services: Never    Active Member of Clubs or Organizations: No    Attends Archivist Meetings: Never    Marital Status: Widowed    Tobacco Counseling Counseling given: Not Answered   Clinical Intake:              How often do you need to have someone help you when you read instructions, pamphlets, or other written materials from your doctor or pharmacy?: (P) 1 - Never  Diabetic? no         Activities of Daily Living    08/04/2022    9:57 AM  In your present state of health, do you have any difficulty performing the following activities:  Hearing? 1  Vision? 0  Difficulty concentrating or making decisions? 0  Walking or climbing stairs? 0  Dressing or bathing? 0  Doing errands, shopping? 0  Preparing Food and eating ? N  Using the Toilet? N  In the past six months, have you accidently leaked urine? N  Do you have problems with loss of bowel control? N  Managing your Medications? N  Managing your Finances? N  Housekeeping or managing your Housekeeping? N    Patient Care Team: Fayrene Helper, MD as PCP - General Norman Clay, MD as Consulting Physician (Psychiatry) Eloise Harman, DO as Consulting Physician (Gastroenterology) Shea Evans Norva Riffle, LCSW as [place] [facility] (Licensed Clinical Social Worker) Avelino Leeds (Cardiology)  Indicate any recent Medical Services you may have received from other than [facility] providers in the past year (date may be  approximate).     Assessment:   This is a routine wellness examination for [place].  Hearing/Vision screen No results found.  Dietary issues and exercise activities discussed:     Goals Addressed   None   Depression Screen    05/12/2022    4:22 PM 02/03/2022    2:20 PM 12/23/2021    1:45 PM 09/23/2021    1:28 PM 09/16/2021    1:59 PM 08/11/2021   11:07 AM 06/20/2021    2:32 PM  PHQ 2/9 Scores  PHQ - 2 Score '3 6 3 4 6 2 '$ 0  PHQ- 9 Score '13 17 5 11 9 9 '$ 0    Fall Risk    08/04/2022    9:57 AM 05/26/2022    4:22 PM 05/12/2022    4:22 PM 02/03/2022    2:20 PM 12/23/2021    1:45 PM  Fall Risk   Falls in the past year? 0 0 0 1 0  Number falls in past yr:  0 0 0 0  Injury with Fall?  0 0 0 0  Risk for fall due to :  No Fall Risks No Fall Risks Impaired balance/gait No Fall Risks  Follow up  Falls evaluation completed Falls evaluation completed Falls evaluation completed Falls evaluation completed    [place]:  Any stairs in or around the home? No  If so, are there any without handrails? No  Home free of loose throw rugs in walkways, pet beds, electrical cords, etc? No  Adequate lighting in your home to reduce risk of falls? Yes   ASSISTIVE DEVICES UTILIZED TO PREVENT FALLS:  Life alert? No  Use of a cane, walker or w/c? No  Grab bars in the bathroom? No  Shower chair or bench in shower? No  Elevated toilet seat or a handicapped toilet? No   TIMED UP AND GO:  Was the test performed? Yes .  Length of time to ambulate 10 feet: 5 sec.   Gait steady and fast without use of assistive device  Cognitive Function:        08/01/2021    9:32 AM 07/26/2020   10:40 AM 07/24/2019    1:56 PM 07/22/2018    8:50 AM  6CIT Screen  What Year? 0 points 0 points 0 points 0 points  What month? 0 points 0 points 0 points 0 points  What time? 0 points 0 points 0 points 0 points  Count back from 20 0 points 0 points 0 points 0 points  Months in reverse  0 points 0 points 0 points 0 points  Repeat phrase 0 points 0 points 0 points 4 points  Total Score 0 points 0 points 0 points 4 points    Immunizations Immunization History  Administered Date(s) Administered   Influenza,inj,Quad PF,6+ Mos 05/06/2014, 10/05/2015, 04/27/2016, 03/11/2018, 04/28/2019, 04/16/2020, 04/15/2021, 04/17/2022   Moderna Covid-19 Vaccine Bivalent Booster 52yr & up 04/28/2022   Moderna SARS-COV2 Booster Vaccination 12/30/2020   Moderna Sars-Covid-2 Vaccination 09/18/2019, 10/21/2019, 05/21/2020, 04/15/2021   Td 05/17/2000   Tdap 02/10/2011    TDAP status: Up to date  Flu Vaccine status: Up to date  Pneumococcal vaccine status: Declined,  Education has been provided regarding the importance of this vaccine but patient still declined. Advised may receive this vaccine at local pharmacy or Health Dept. Aware to provide a copy of the vaccination record if obtained from local pharmacy or Health Dept. Verbalized acceptance and understanding.   Covid-19 vaccine status: Completed vaccines  Qualifies for Shingles Vaccine? Yes   Zostavax completed No   Shingrix Completed?: No.    Education has been provided regarding the importance of this vaccine. Patient has been advised to call insurance company to determine out of pocket expense if they have not yet received this vaccine. Advised may also receive vaccine at local pharmacy or Health Dept. Verbalized acceptance and understanding.  Screening Tests Health Maintenance  Topic Date Due   DTaP/Tdap/Td (3 - Td or Tdap) 02/09/2021   COVID-19 Vaccine (7 - 2023-24 season) 06/23/2022   Zoster Vaccines- Shingrix (1 of 2) 05/10/2023 (Originally 02/29/2008)   Medicare Annual Wellness (AWV)  08/08/2023   MAMMOGRAM  07/24/2024   PAP SMEAR-Modifier  05/26/2025   COLONOSCOPY (Pts 45-461yrInsurance coverage will need to be confirmed)  01/13/2029   INFLUENZA VACCINE  Completed   Hepatitis C Screening  Completed   HIV Screening   Completed   HPV VACCINES  Aged Out    Health Maintenance  Health Maintenance Due  Topic Date Due   DTaP/Tdap/Td (3 - Td or Tdap) 02/09/2021   COVID-19 Vaccine (7 - 2023-24 season) 06/23/2022    Colorectal cancer screening: Type of screening: Colonoscopy. Completed 2020. Repeat every 10 years  Mammogram status: Completed 2023. Repeat every year  Bone density status: not completed  Lung Cancer Screening: (Low Dose CT Chest recommended if Age 65-80ears, 30 pack-year currently smoking OR have quit w/in 15years.) does not qualify.   Lung Cancer Screening Referral: NA  Additional Screening:  Hepatitis C Screening: does not qualify; Completed 2017  Vision Screening: Recommended annual ophthalmology exams for early detection of glaucoma and other disorders of the eye. Is the patient up to date with their annual eye exam?  Yes  Who is the provider or what is the name of the office in which the patient attends annual eye exams? [facility]  If pt is not established with a provider, would they like to be referred to a provider to establish care? No .   Dental Screening: Recommended annual dental exams for proper oral hygiene  Community Resource Referral / Chronic Care Management: CRR required this visit?  No   CCM required this visit?  No      Plan:     I have personally reviewed and noted the following in the patient's chart:   Medical and social history Use of alcohol, tobacco or illicit drugs  Current medications and supplements including opioid prescriptions. Patient is not currently taking opioid prescriptions. Functional ability and status Nutritional status Physical activity Advanced directives List of other physicians Hospitalizations, surgeries, and ER visits in previous 12 months Vitals Screenings to include cognitive, depression, and falls Referrals and appointments  In addition, I have reviewed and discussed with patient certain preventive protocols, quality  metrics, and best practice recommendations. A written personalized care plan for preventive services as well as general preventive health recommendations were provided to patient.     Smitty Knudsen, CMA   08/07/2022

## 2022-08-29 ENCOUNTER — Ambulatory Visit: Payer: Medicare Other | Admitting: Obstetrics and Gynecology

## 2022-08-29 ENCOUNTER — Encounter: Payer: Self-pay | Admitting: Obstetrics and Gynecology

## 2022-08-29 VITALS — BP 148/83 | HR 78 | Ht 63.0 in | Wt 152.2 lb

## 2022-08-29 DIAGNOSIS — L309 Dermatitis, unspecified: Secondary | ICD-10-CM | POA: Diagnosis not present

## 2022-08-29 MED ORDER — CLOTRIMAZOLE-BETAMETHASONE 1-0.05 % EX CREA: 1.0000 | TOPICAL_CREAM | Freq: Two times a day (BID) | CUTANEOUS | 1 refills | Status: DC

## 2022-08-29 MED ORDER — CEPHALEXIN 500 MG PO CAPS: 500.0000 mg | ORAL_CAPSULE | Freq: Two times a day (BID) | ORAL | 0 refills | Status: AC

## 2022-08-29 NOTE — Progress Notes (Signed)
Ms Duplantis presents for evaluation of rash in her genital area. Has noted since mid Jan. Tried OTC meds without relief Remote H/O HSV as a teenage Not sexual active Denies any exposure or change in soaps  PE AF VSS Chaperone present Lungs clear Heart RRR Abd soft +BS GU rash involving mons and inguinal folds, possible superimposed skin infection note, small ulcer like areas note, slightly painful  A/P Dermatitis HSV culture obtained. Keflex for possible infection. Lotrisone cream and A & D onit as well.  F/U in 2 weeks

## 2022-08-29 NOTE — Addendum Note (Signed)
Addended by: Octaviano Glow on: 08/29/2022 04:06 PM   Modules accepted: Orders

## 2022-08-30 ENCOUNTER — Ambulatory Visit: Payer: Medicare Other | Admitting: Medical

## 2022-09-01 ENCOUNTER — Ambulatory Visit: Payer: Medicare Other | Attending: Internal Medicine

## 2022-09-01 DIAGNOSIS — Z0000 Encounter for general adult medical examination without abnormal findings: Secondary | ICD-10-CM

## 2022-09-01 LAB — HERPES SIMPLEX VIRUS CULTURE

## 2022-09-01 NOTE — Progress Notes (Signed)
Appointment Outcome: Completed, Session #: 3-monthf/u                        Start time: 11:00am   End time: 11:24am   Total Mins: 24 minutes  AGREEMENTS SECTION   Overall Goal(s): Stress management                                            Agreement/Action Steps:  Stress management Establish healthy boundaries Practice deep breathing Conduct self-check-ins Implement positive self-talk Engage in daily devotional Seek a counselor    Progress Notes:  Patient stated that she is feeling overwhelmed because she is caring for two family members and have not been focused on caring for herself. Patient expressed that she recognizes that she cannot do it all and have tried to relinquish some responsibilities but does not have the support. Patient reported that she has not been focused on implementing her action steps.   Patient shared that she hasn't called the number on the back of her insurance card to identify a therapist within in her network. Patient stated that she can make the call between today and Monday. Patient shared that she is having trouble taking her medications due to the side effects but mentioned that a previous prescription for anxiety was probably working but she stopped taking it. Patient contributed her neck pain to the medication, but as later diagnosed with fibromyalgia.   Patient stated that she has an appointment with Dr. ROval Linseyon 2/19 and will talk to her about recommendations for an in-person therapist (patient's preference). Patient expressed that I have done a lot to help her, but it's now on her to take care of herself. Patient mentioned that she may need to restart health coaching at some point.     Indicators of Success and Accountability:  Patient has decided that she wants to seek additional support through therapy.  Readiness: Patient is in the contemplation stage of stress management.  Strengths and Supports: Patient is relying on her faith. Patient  is seeking additional support currently.  Challenges and Barriers: Patient is juggling multiple responsibilities with care giving.     Coaching Outcomes: Patient will call the number on the back of her insurance card to determine what behavioral health services are within her network.  Patient has an appointment with Dr. ROval Linseyon 2/19 and will talk with her about her desire to seek therapy and possibly psychiatric medication management.  Will check in with patient on 2/19 to ensure that she is able to identify the behavioral health resources that she needs.  Patient has been scheduled for her final follow up in April 2024.    Not attempted: Deferred - Patient stated that she desires to manage her stress but has been focused on care giving for two family members, which was prioritized over self-care.

## 2022-09-04 ENCOUNTER — Encounter (HOSPITAL_BASED_OUTPATIENT_CLINIC_OR_DEPARTMENT_OTHER): Payer: Self-pay | Admitting: Cardiovascular Disease

## 2022-09-04 ENCOUNTER — Ambulatory Visit (HOSPITAL_BASED_OUTPATIENT_CLINIC_OR_DEPARTMENT_OTHER): Payer: Medicare Other | Admitting: Cardiovascular Disease

## 2022-09-04 VITALS — BP 146/84 | HR 89 | Ht 63.0 in | Wt 154.2 lb

## 2022-09-04 DIAGNOSIS — F32A Depression, unspecified: Secondary | ICD-10-CM | POA: Diagnosis not present

## 2022-09-04 DIAGNOSIS — I10 Essential (primary) hypertension: Secondary | ICD-10-CM

## 2022-09-04 DIAGNOSIS — E785 Hyperlipidemia, unspecified: Secondary | ICD-10-CM

## 2022-09-04 DIAGNOSIS — Z006 Encounter for examination for normal comparison and control in clinical research program: Secondary | ICD-10-CM

## 2022-09-04 MED ORDER — REPATHA SURECLICK 140 MG/ML ~~LOC~~ SOAJ: 140.0000 mg | SUBCUTANEOUS | 3 refills | Status: DC

## 2022-09-04 MED ORDER — SODIUM CHLORIDE 0.9% FLUSH: 3.0000 mL | Freq: Two times a day (BID) | INTRAVENOUS | Status: AC

## 2022-09-04 NOTE — Progress Notes (Signed)
[facility] Follow-up:    Date:  09/04/2022   ID:  Shirley Bush, DOB April 11, 1956, MRN [id number]  PCP:  Fayrene Helper, MD  Cardiologist:  None  Nephrologist:  Referring MD: Fayrene Helper, MD   CC: Hypertension  History of Present Illness:    Shirley Bush is a 67 y.o. female with a hx of hypertension, hyperlipidemia, and anxiety, here for follow-up. She was initially seen 01/23/2022 in the [facility]. She saw her PCP 12/2021 and her blood pressure was 172/94. Her blood pressure has been difficult to manage for years. At that appointment, amlodipine was increased to 10 mg and HCTZ 12.5 mg was added. She was encouraged to exercise and follow the DASH diet. She has struggled with medication intolerance. She previously saw Dr. Johnsie Cancel for chest pain. Her symptoms were thought to be related to uncontrolled hypertension and possibly indigestion. She had a nuclear stress test 02/2021 that was negative for ischemia. She only achieved 4.6 METS on a Bruce protocol. Echo at that time revealed LVEF 60-65% with normal diastolic function.   She confirmed struggling with hypertension since her 12's in the setting of a work injury that could have been life-threatening. Home blood pressures were 150-160/80-90 on average, so spironolactone 25 mg daily was added and she was enrolled in the [place] [facility] system. She also complained of occasional chest pain that woke her up at night. It was recommended to limit her caffeine and sodium intake. Her potassium supplement was discontinued. She was referred to PREP. On 02/23/22 she saw our pharmacist and her BP was 148/84. She reported being under significant stress at home. She was given a sample course of Edarbi to see if she would tolerate an ARB. She discontinued Edarbi after about 2 weeks due to feeling poorly. It also didn't seem to be very effective. At her follow-up 03/23/22 she brought home readings with mostly high 140s  to low 150s over 80s/90s. She was started on 5 mg nebivolol which she was able to tolerate. Her blood pressures decreased to [id number] systolic. At her most recent follow-up 04/28/22 she also asked to discontinue spironolactone due to pill burden, which was agreeable. She was advised it may need to be restarted if her BP increased. Nebivolol was increased to 10 mg daily.   At her last visit home blood pressures averaged 130s-140s/70s-80s. She reported skin dryness on spironolactone. We discussed alternatives but she preferred to continue with her medications. She continued to struggle with stress, anxiety, and depression. She was not interested in any therapy or medicines. Her lipids were quite elevated. Due to known statin intolerance we gave her a sample of Repatha. Today, she is feeling about the same. At home her blood pressures have averaged in the mid 130s/70s-80s, highest in the 0000000 systolic. Lately she has not been participating in formal exercise. Once she does start exercising, she has pain due to fibromyalgia. Mostly she is struggling with a lack of motivation. She confirms that she did tolerate the initial dose of Repatha. Recently she had been started on gabapentin as needed, and buspirone. Of note, she has allergic reactions to capsules and soft gels. This occurred with the gabapentin. She denies any palpitations, chest pain, shortness of breath, or peripheral edema.  No lightheadedness, headaches, syncope, orthopnea, or PND.  Previous antihypertensives: Lisinopril - rash Maxide Amlodipine - swelling Hydralazine HCTZ - rash  Past Medical History:  Diagnosis Date   Allergy Don't remember   Don't remember  Anxiety    Arthritis In 2007   In both of my shoulders and my knees   Depression 2002   Depression    Phreesia 06/28/2020   Depression    Phreesia 07/23/2020   Encounter for annual physical exam 12/03/2015   Fibromyalgia    GERD (gastroesophageal reflux disease) 01/23/2022    Heart murmur Don't remember   I was in 20s   Hypertension 2004   Medial meniscus tear 04/2013   left   Migraines     Past Surgical History:  Procedure Laterality Date   [place] N/A    Phreesia 06/28/2020   COLONOSCOPY  02/05/2009   COLONOSCOPY N/A 03/06/2014   Procedure: COLONOSCOPY;  Surgeon: Danie Binder, MD;  [facility];  Service: Endoscopy;  Laterality: N/A;  10:30 AM   COLONOSCOPY WITH PROPOFOL N/A 01/14/2019   Surgeon: Danie Binder, MD;  one 3 mm tubular adenoma in the mid ascending colon, moderate diverticulosis in entire colon, external and internal hemorrhoids, tortuous left colon. Recommended repeat in 5 years.    FINGER SURGERY Right    long finger   HERNIA REPAIR N/A    Phreesia 06/28/2020   JOINT REPLACEMENT N/A    Phreesia 06/28/2020   KNEE ARTHROSCOPY WITH MEDIAL MENISECTOMY Left 04/24/2013   Procedure: LEFT KNEE ARTHROSCOPY CHONDROPLASTY WITH MEDIAL MENISECTOMY;  Surgeon: Ninetta Lights, MD;  Location: [place];  Service: Orthopedics;  Laterality: Left;   SHOULDER ARTHROSCOPY Left 2009   SHOULDER ARTHROSCOPY Right    SHOULDER ARTHROSCOPY WITH ROTATOR CUFF REPAIR Left 11/25/2004   TOTAL KNEE ARTHROPLASTY Left 11/11/2014   Procedure: LEFT TOTAL KNEE ARTHROPLASTY;  Surgeon: Kathryne Hitch, MD;  Location: [place];  Service: Orthopedics;  Laterality: Left;   TUBAL LIGATION  0000000   UMBILICAL HERNIA REPAIR  2008    Current Medications: Current Meds  Medication Sig   amLODipine (NORVASC) 10 MG tablet Take 1 tablet (10 mg total) by mouth daily.   busPIRone (BUSPAR) 5 MG tablet Take 5 mg by mouth 2 (two) times daily.   clotrimazole-betamethasone (LOTRISONE) cream Apply 1 Application topically 2 (two) times daily.   diphenhydrAMINE (BENADRYL) 25 mg capsule Take 25 mg by mouth every 6 (six) hours as needed.   FLUARIX QUADRIVALENT 0.5 ML injection     gabapentin (NEURONTIN) 100 MG capsule Take one to two capsules at bedtime for pain   ibuprofen (ADVIL) 200 MG tablet Take 200 mg by mouth every 6 (six) hours as needed.   Misc Natural Products (AIRBORNE ELDERBERRY) CHEW Chew 1 each by mouth daily.   Multiple Vitamins-Minerals (HAIR SKIN AND NAILS FORMULA) TABS Take 3 tablets by mouth daily.   nebivolol (BYSTOLIC) 10 MG tablet Take 1 tablet (10 mg total) by mouth daily.   Specialty Vitamins Products (COLLAGEN ULTRA) CAPS Take 1 each by mouth daily.   SPIKEVAX syringe    spironolactone (ALDACTONE) 25 MG tablet Take 25 mg by mouth daily.   UNABLE TO FIND TDAP Vaccine DX: Z23   [DISCONTINUED] Evolocumab (REPATHA SURECLICK) XX123456 MG/ML SOAJ Inject 140 mg into the skin every 14 (fourteen) days.     Allergies:   Lisinopril, Maxzide [hydrochlorothiazide w-triamterene], Other, Buspar [buspirone], Beano meltaways [alpha-d-galactosidase], Crestor [rosuvastatin calcium], Hydrochlorothiazide, Paxil [paroxetine], and Simethicone   Social History   Socioeconomic History   Marital status: Divorced    Spouse name: Not on file  Number of children: 3   Years of education: 12 grade    Highest education level: 12th grade  Occupational History   Occupation: disability   Tobacco Use   Smoking status: Never   Smokeless tobacco: Never  Vaping Use   Vaping Use: Never used  Substance and Sexual Activity   Alcohol use: Not Currently    Comment: occasionally- rarely.    Drug use: No   Sexual activity: Not Currently    Birth control/protection: None, Post-menopausal  Other Topics Concern   Not on file  Social History Narrative   Son is living with her at the moment, patient states that she is isolating in her home and is depressed more lately    Social Determinants of Health   Financial Resource Strain: Low Risk  (08/07/2022)   Overall Financial Resource Strain (CARDIA)    Difficulty of Paying Living Expenses: Not hard at all  Food Insecurity: No Food  Insecurity (08/07/2022)   Hunger Vital Sign    Worried About Running Out of Food in the Last Year: Never true    Ruth in the Last Year: Never true  Transportation Needs: No Transportation Needs (08/07/2022)   PRAPARE - Hydrologist (Medical): No    Lack of Transportation (Non-Medical): No  Physical Activity: Inactive (08/07/2022)   Exercise Vital Sign    Days of Exercise per Week: 0 days    Minutes of Exercise per Session: 0 min  Stress: Stress Concern Present (08/07/2022)   [place]    Feeling of Stress : Very much  Social Connections: Socially Isolated (08/07/2022)   Social Connection and Isolation Panel [NHANES]    Frequency of Communication with Friends and Family: More than three times a week    Frequency of Social Gatherings with Friends and Family: Once a week    Attends Religious Services: Never    Marine scientist or Organizations: No    Attends Music therapist: Never    Marital Status: Divorced     Family History: The patient's family history includes Alcohol abuse in her father; Bone cancer in her mother; Cancer in her maternal aunt, sister, and sister; Colon cancer in her sister; Heart attack in her maternal grandmother; Hypertension in her father, mother, and sister; Migraines in her sister; Seizures in her father; Thyroid disease in her sister and sister.  ROS:   Please see the history of present illness.    (+) Myalgias (+) Stress All other systems reviewed and are negative.  EKGs/Labs/Other Studies Reviewed:    Echocardiogram  02/16/2021:  1. Left ventricular ejection fraction, by estimation, is 60 to 65%. The  left ventricle has normal function. The left ventricle has no regional  wall motion abnormalities. Left ventricular diastolic parameters were  normal.   2. Right ventricular systolic function is normal. The right ventricular  size is  normal. Tricuspid regurgitation signal is inadequate for assessing  PA pressure.   3. There is a trivial pericardial effusion posterior to the left  ventricle.   4. The mitral valve is grossly normal. Mild mitral valve regurgitation.   5. The aortic valve is tricuspid. Aortic valve regurgitation is trivial.  Aortic valve mean gradient measures 6.0 mmHg. No aortic stenosis.   6. The inferior vena cava is normal in size with greater than 50%  respiratory variability, suggesting right atrial pressure of 3 mmHg.   Comparison(s): No prior  Echocardiogram.   Nuclear Stress Myoview  02/16/2021: Patient exercised for 3 minutes achieving a maximum workload of 4.6 METS and MPHR 95%. No chest pain reported, exercise was limited by knee pain. There were no diagnostic ST segment changes to indicate ischemia. No significant arrhythmias. Calculated Duke treadmill score of 3 is intermediate risk, mainly based on exercise time alone. Blood pressure demonstrated a normal response to exercise. No significant myocardial perfusion defects to indicate scar or ischemia. This is a low risk study based on normal perfusion. Nuclear stress EF: 74%.   EKG:  EKG is personally reviewed. 09/04/2022:  EKG was not ordered. 06/15/2022:  EKG was not ordered. 01/23/2022: Sinus rhythm. Rate 100 bpm.  Recent Labs: 01/30/2022: ALT 17; Hemoglobin 13.5; Platelets 426; TSH 5.010 02/03/2022: BUN 9; Creatinine, Ser 0.88; Potassium 4.9; Sodium 137   Recent Lipid Panel    Component Value Date/Time   CHOL 223 (H) 01/30/2022 0827   TRIG 85 01/30/2022 0827   HDL 61 01/30/2022 0827   CHOLHDL 3.7 01/30/2022 0827   CHOLHDL 4.4 10/03/2019 0809   VLDL 20 10/07/2015 0808   LDLCALC 147 (H) 01/30/2022 0827   LDLCALC 148 (H) 10/03/2019 0809    Physical Exam:    VS:  BP (!) 146/84 (BP Location: Left Arm, Patient Position: Sitting, Cuff Size: Normal)   Pulse 89   Ht 5' 3"$  (1.6 m)   Wt 154 lb 3.2 oz (69.9 kg)   SpO2 99%   BMI 27.32  kg/m  , BMI Body mass index is 27.32 kg/m. GENERAL:  Well-appearing.   HEENT: Pupils equal round and reactive, fundi not visualized, oral mucosa unremarkable NECK:  No jugular venous distention, waveform within normal limits, carotid upstroke brisk and symmetric, no bruits, no thyromegaly LUNGS:  Clear to auscultation bilaterally HEART:  RRR.  PMI not displaced or sustained,S1 and S2 within normal limits, no S3, no S4, no clicks, no rubs, no murmurs ABD:  Flat, positive bowel sounds normal in frequency in pitch, no bruits, no rebound, no guarding, no midline pulsatile mass, no hepatomegaly, no splenomegaly EXT:  2 plus pulses throughout, no edema, no cyanosis no clubbing SKIN:  No rashes no nodules NEURO:  Cranial nerves II through XII grossly intact, motor grossly intact throughout PSYCH:  Cognitively intact, oriented to person place and time   ASSESSMENT/PLAN:    Essential hypertension Her blood pressures are better controlled at home than here.  However many of them are above goal at home as well.  She struggles with intolerance to many medications.  She is taking her medications now but really does not like the spironolactone as it causes dry skin.  We again discussed that there are not many alternatives.  Doxazosin and clonidine would be possibilities.  I suspect she would tolerate clonidine if spironolactone causes dryness.  She is very interested in our renal denervation and consents to a renal denervation procedure.  Continue amlodipine, nebivolol, and spironolactone.  She does not have the energy and willpower to exercise right now.  Her depression is uncontrolled.  She continues to work with her primary care provider about this and we will refer her to her requested psychiatrist as well.  Hyperlipidemia She was given a sample of Repatha and tolerated it.  We will send this to her pharmacy.   Screening for Secondary Hypertension:     01/23/2022    1:52 PM  Causes  Drugs/Herbals  Screened     - Comments Limits sodium intake.  Caffeine tablet and  coffee.  Some NSAIDS  Sleep Apnea Screened     - Comments snores but no apnea  Thyroid Disease Screened  Hyperaldosteronism N/A  Pheochromocytoma N/A  Cushing's Syndrome N/A  Hyperparathyroidism N/A  Coarctation of the Aorta Screened     - Comments BP symmetric  Compliance Screened    Relevant Labs/Studies:    Latest Ref Rng & Units 02/03/2022    9:09 AM 01/30/2022    8:27 AM 01/24/2021    9:16 AM  Basic Labs  Sodium 134 - 144 mmol/L 137  138  141   Potassium 3.5 - 5.2 mmol/L 4.9  4.5  4.5   Creatinine 0.57 - 1.00 mg/dL 0.88  0.91  0.81        Latest Ref Rng & Units 01/30/2022    8:27 AM 01/24/2021    9:16 AM  Thyroid   TSH 0.450 - 4.500 uIU/mL 5.010  3.930    Disposition:    FU with Marithza Malachi C. Oval Linsey, MD, [facility] in 2-3 months.  Medication Adjustments/Labs and Tests Ordered: Current medicines are reviewed at length with the patient today.  Concerns regarding medicines are outlined above.   Orders Placed This Encounter  Procedures   CBC with Differential/Platelet   Basic metabolic panel   Ambulatory referral to Psychology   Meds ordered this encounter  Medications   Evolocumab (REPATHA SURECLICK) XX123456 MG/ML SOAJ    Sig: Inject 140 mg into the skin every 14 (fourteen) days.    Dispense:  2 mL    Refill:  3   I,Mathew Stumpf,acting as a scribe for Skeet Latch, MD.,have documented all relevant documentation on the behalf of Skeet Latch, MD,as directed by  Skeet Latch, MD while in the presence of Skeet Latch, MD.  I, [place] Oval Linsey, MD have reviewed all documentation for this visit.  The documentation of the exam, diagnosis, procedures, and orders on 09/04/2022 are all accurate and complete.  Signed, Skeet Latch, MD  09/04/2022 4:36 PM    [facility]

## 2022-09-04 NOTE — Assessment & Plan Note (Signed)
Her blood pressures are better controlled at home than here.  However many of them are above goal at home as well.  She struggles with intolerance to many medications.  She is taking her medications now but really does not like the spironolactone as it causes dry skin.  We again discussed that there are not many alternatives.  Doxazosin and clonidine would be possibilities.  I suspect she would tolerate clonidine if spironolactone causes dryness.  She is very interested in our renal denervation and consents to a renal denervation procedure.  Continue amlodipine, nebivolol, and spironolactone.  She does not have the energy and willpower to exercise right now.  Her depression is uncontrolled.  She continues to work with her primary care provider about this and we will refer her to her requested psychiatrist as well.

## 2022-09-04 NOTE — Assessment & Plan Note (Signed)
She was given a sample of Repatha and tolerated it.  We will send this to her pharmacy.

## 2022-09-04 NOTE — Patient Instructions (Signed)
Medication Instructions:  Your physician recommends that you continue on your current medications as directed. Please refer to the Current Medication list given to you today.   Labwork: CBC/BMET 1 WEEK PRIOR TO PROCEDURE   Testing/Procedures: RENAL DENERVATION THE OFFICE WILL CALL TO ARRANGE ONCE INSURANCE HAS APPROVED   Follow-Up: 11/02/2022 11:20 AM WITH Urban Gibson W NP   A referral has been placed for Dr Karna Dupes, you can call the directly at [phone]

## 2022-09-05 ENCOUNTER — Ambulatory Visit: Payer: Medicare Other | Admitting: Physician Assistant

## 2022-09-08 NOTE — Research (Signed)
I saw pt today after Dr. Blenda Mounts follow up visit. Pt is in Dr. Blenda Mounts Virtual Care HTN Study. Pt filled out research survey. Pt is enrolled in Group 1.

## 2022-09-09 ENCOUNTER — Other Ambulatory Visit: Payer: Self-pay | Admitting: Cardiovascular Disease

## 2022-09-09 DIAGNOSIS — I10 Essential (primary) hypertension: Secondary | ICD-10-CM

## 2022-09-11 NOTE — Telephone Encounter (Signed)
Rx(s) sent to pharmacy electronically.  

## 2022-09-12 ENCOUNTER — Other Ambulatory Visit: Payer: Self-pay | Admitting: Family Medicine

## 2022-09-14 ENCOUNTER — Ambulatory Visit: Payer: Medicare Other | Admitting: Obstetrics and Gynecology

## 2022-09-14 ENCOUNTER — Encounter: Payer: Self-pay | Admitting: Obstetrics and Gynecology

## 2022-09-14 VITALS — BP 150/84 | HR 60 | Ht 63.0 in | Wt 153.0 lb

## 2022-09-14 DIAGNOSIS — L309 Dermatitis, unspecified: Secondary | ICD-10-CM | POA: Diagnosis not present

## 2022-09-14 NOTE — Progress Notes (Signed)
Ms Ferdon presents for f/u of her dermatitis See prior office note She reports doing well. Sx have resolved  PE AF VSS Lungs clear Heart  RRR Abd soft + BS GU deferred  A/P Dermatitis Resolved. F/U PRN

## 2022-09-15 ENCOUNTER — Telehealth: Payer: Self-pay | Admitting: *Deleted

## 2022-09-15 NOTE — Telephone Encounter (Signed)
Attempted to reach the patient to discuss the Renal Denervation procedure. Was unable to leave a message.   MyChart message has been sent as well

## 2022-09-15 NOTE — Telephone Encounter (Signed)
Attempted to reach the patient to discuss the Renal Denervation procedure. Was unable to leave a message.

## 2022-09-20 NOTE — Telephone Encounter (Signed)
The patient would like to proceed with the procedure. She will talk to her family and decide on a good date. She has asked that Dr. Fletcher Anon call her Friday afternoon to discuss in detail any questions she or her family has.

## 2022-09-20 NOTE — Telephone Encounter (Signed)
Called and spoke with the patient. She is interested in moving forward with the Renal Denervation procedure. She was currently at the doctor and asked that we call her back later.

## 2022-09-20 NOTE — Telephone Encounter (Signed)
Patient returned call

## 2022-09-22 ENCOUNTER — Ambulatory Visit: Payer: Medicare Other | Admitting: Family Medicine

## 2022-09-22 ENCOUNTER — Ambulatory Visit (INDEPENDENT_AMBULATORY_CARE_PROVIDER_SITE_OTHER): Payer: Medicare Other | Admitting: Family Medicine

## 2022-09-22 ENCOUNTER — Encounter: Payer: Self-pay | Admitting: Family Medicine

## 2022-09-22 VITALS — BP 144/78 | HR 77 | Resp 12 | Ht 63.0 in | Wt 155.0 lb

## 2022-09-22 DIAGNOSIS — F322 Major depressive disorder, single episode, severe without psychotic features: Secondary | ICD-10-CM

## 2022-09-22 DIAGNOSIS — E785 Hyperlipidemia, unspecified: Secondary | ICD-10-CM | POA: Diagnosis not present

## 2022-09-22 DIAGNOSIS — I10 Essential (primary) hypertension: Secondary | ICD-10-CM | POA: Diagnosis not present

## 2022-09-22 MED ORDER — NYSTATIN-TRIAMCINOLONE 100000-0.1 UNIT/GM-% EX OINT: 1.0000 | TOPICAL_OINTMENT | Freq: Two times a day (BID) | CUTANEOUS | 1 refills | Status: DC

## 2022-09-22 NOTE — Patient Instructions (Signed)
F/U in 4 month , call if you need me sooner  BP is improved   All the best with planned procedure, I am sure it I will beneficial  Michae Kava  It is important that you exercise regularly at least 30 minutes 5 times a week. If you develop chest pain, have severe difficulty breathing, or feel very tired, stop exercising immediately and seek medical attention   Thanks for choosing [facility], we consider it a privelige to serve you.

## 2022-09-28 ENCOUNTER — Other Ambulatory Visit (HOSPITAL_COMMUNITY): Payer: Self-pay

## 2022-09-28 ENCOUNTER — Encounter: Payer: Self-pay | Admitting: Family Medicine

## 2022-09-28 ENCOUNTER — Telehealth: Payer: Self-pay

## 2022-09-28 DIAGNOSIS — F322 Major depressive disorder, single episode, severe without psychotic features: Secondary | ICD-10-CM | POA: Insufficient documentation

## 2022-09-28 DIAGNOSIS — F332 Major depressive disorder, recurrent severe without psychotic features: Secondary | ICD-10-CM | POA: Insufficient documentation

## 2022-09-28 NOTE — Assessment & Plan Note (Signed)
Hyperlipidemia:Low fat diet discussed and encouraged.   Lipid Panel  Lab Results  Component Value Date   CHOL 223 (H) 01/30/2022   HDL 61 01/30/2022   LDLCALC 147 (H) 01/30/2022   TRIG 85 01/30/2022   CHOLHDL 3.7 01/30/2022     On repatha, needs update lab

## 2022-09-28 NOTE — Assessment & Plan Note (Signed)
Not suicidal or homicidal No onterest in Psych or therapy chronically severely depressed however functional

## 2022-09-28 NOTE — Telephone Encounter (Signed)
Left message at [place] approvved

## 2022-09-28 NOTE — Telephone Encounter (Signed)
Pharmacy Patient Advocate Encounter   Received notification from [facility] that prior authorization for REPATHA 140 MG/ML INJ is needed.    PA submitted on 09/28/22 Key [id number] Status is pending  Karie Soda, [place] Patient Advocate Specialist Direct Number: [phone] Fax: [phone]

## 2022-09-28 NOTE — Progress Notes (Signed)
   Shirley Bush     MRN: [id number]      DOB: Apr 29, 1956   HPI Shirley Bush is here for follow up and re-evaluation of chronic medical conditions, medication management and review of any available recent lab and radiology data.  Preventive health is updated, specifically  Cancer screening and Immunization.   Questions or concerns regarding consultations or procedures which the PT has had in the interim are  addressed. The PT denies any adverse reactions to current medications since the last visit.  There are no new concerns.  There are no specific complaints   ROS Denies recent fever or chills. Denies sinus pressure, nasal congestion, ear pain or sore throat. Denies chest congestion, productive cough or wheezing. Denies chest pains, palpitations and leg swelling Denies abdominal pain, nausea, vomiting,diarrhea or constipation.   Denies dysuria, frequency, hesitancy or incontinence. . Denies skin break down or rash.   PE  BP (!) 144/78   Pulse 77   Resp 12   Ht 5\' 3"  (1.6 m)   Wt 155 lb (70.3 kg)   SpO2 98%   BMI 27.46 kg/m   Patient alert and oriented and in no cardiopulmonary distress.  HEENT: No facial asymmetry, EOMI,     Neck supple .  Chest: Clear to auscultation bilaterally.  CVS: S1, S2 no murmurs, no S3.Regular rate.  ABD: Soft non tender.   Ext: No edema  MS: Adequate ROM spine, shoulders, hips and knees.  Skin: Intact, no ulcerations or rash noted.  Psych: Good eye contact, normal affect. Memory intact not anxious or depressed appearing.  CNS: CN 2-12 intact, power,  normal throughout.no focal deficits noted.   Assessment & Plan  Essential hypertension I'mproved, managed by [facility] has upcoming planned procedure to improve BP control, I encourage her to follow through with this DASH diet and commitment to daily physical activity for a minimum of 30 minutes discussed and encouraged, as a part of hypertension management. The importance of attaining a  healthy weight is also discussed.     09/22/2022   11:10 AM 09/14/2022    3:01 PM 09/14/2022    2:55 PM 09/04/2022    2:18 PM 09/04/2022    1:22 PM 08/29/2022    2:47 PM 08/07/2022    4:35 PM  BP/Weight  Systolic BP 379 024 097 353 299 242 683  Diastolic BP 78 84 85 84 90 83 82  Wt. (Lbs) 155  153  154.2 152.2 153  BMI 27.46 kg/m2  27.1 kg/m2  27.32 kg/m2 26.96 kg/m2 27.1 kg/m2       Hyperlipidemia Hyperlipidemia:Low fat diet discussed and encouraged.   Lipid Panel  Lab Results  Component Value Date   CHOL 223 (H) 01/30/2022   HDL 61 01/30/2022   LDLCALC 147 (H) 01/30/2022   TRIG 85 01/30/2022   CHOLHDL 3.7 01/30/2022     On repatha, needs update lab   Depression, major, single episode, severe ([place]) Not suicidal or homicidal No onterest in Psych or therapy chronically severely depressed however functional

## 2022-09-28 NOTE — Assessment & Plan Note (Signed)
I'mproved, managed by [facility] has upcoming planned procedure to improve BP control, I encourage her to follow through with this DASH diet and commitment to daily physical activity for a minimum of 30 minutes discussed and encouraged, as a part of hypertension management. The importance of attaining a healthy weight is also discussed.     09/22/2022   11:10 AM 09/14/2022    3:01 PM 09/14/2022    2:55 PM 09/04/2022    2:18 PM 09/04/2022    1:22 PM 08/29/2022    2:47 PM 08/07/2022    4:35 PM  BP/Weight  Systolic BP 123456 [id number] [id number] 123456 [id number]  Diastolic BP 78 84 85 84 90 83 82  Wt. (Lbs) 155  153  154.2 152.2 153  BMI 27.46 kg/m2  27.1 kg/m2  27.32 kg/m2 26.96 kg/m2 27.1 kg/m2

## 2022-09-28 NOTE — Telephone Encounter (Signed)
Pharmacy Patient Advocate Encounter  Prior Authorization for REPATHA 140 MG/ML INJ has been approved.    PA# [id number] Effective through 03/31/23  Karie Soda, [place] Patient Advocate Specialist Direct Number: [phone] Fax: [phone]

## 2022-10-04 NOTE — Progress Notes (Deleted)
Cardiology Office Note   Date:  10/04/2022   ID:  Shirley, Bush Jun 14, 1956, MRN [id number]  PCP:  Fayrene Helper, MD  Cardiologist:  Dr. Oval Linsey    No chief complaint on file.     History of Present Illness: Shirley Bush is a 67 y.o. female who presents for ***  hx of hypertension, hyperlipidemia, and anxiety, here for follow-up. She was initially seen 01/23/2022 in [facility]. She saw her PCP 12/2021 and her blood pressure was 172/94. Her blood pressure has been difficult to manage for years. At that appointment, amlodipine was increased to 10 mg and HCTZ 12.5 mg was added. She was encouraged to exercise and follow the DASH diet. She has struggled with medication intolerance. She previously saw Dr. Johnsie Cancel for chest pain. Her symptoms were thought to be related to uncontrolled hypertension and possibly indigestion. She had a nuclear stress test 02/2021 that was negative for ischemia. She only achieved 4.6 METS on a Bruce protocol. Echo at that time revealed LVEF 60-65% with normal diastolic function.    She confirmed struggling with hypertension since her 55's in the setting of a work injury that could have been life-threatening. Home blood pressures were 150-160/80-90 on average, so spironolactone 25 mg daily was added and she was enrolled in the [facility] system. She also complained of occasional chest pain that woke her up at night. It was recommended to limit her caffeine and sodium intake. Her potassium supplement was discontinued. She was referred to [facility]. On 02/23/22 she saw our pharmacist and her BP was 148/84. She reported being under significant stress at home. She was given a sample course of Edarbi to see if she would tolerate an ARB. She discontinued Edarbi after about 2 weeks due to feeling poorly. It also didn't seem to be very effective. At her follow-up 03/23/22 she brought home readings with mostly high 140s to low 150s over 80s/90s. She was  started on 5 mg nebivolol which she was able to tolerate. Her blood pressures decreased to [id number] systolic. At her most recent follow-up 04/28/22 she also asked to discontinue spironolactone due to pill burden, which was agreeable. She was advised it may need to be restarted if her BP increased. Nebivolol was increased to 10 mg daily.    At her last visit home blood pressures averaged 130s-140s/70s-80s. She reported skin dryness on spironolactone. We discussed alternatives but she preferred to continue with her medications. She continued to struggle with stress, anxiety, and depression. She was not interested in any therapy or medicines. Her lipids were quite elevated. Due to known statin intolerance we gave her a sample of Repatha. Today, she is feeling about the same. At home her blood pressures have averaged in the mid 130s/70s-80s, highest in the 0000000 systolic. Lately she has not been participating in formal exercise. Once she does start exercising, she has pain due to fibromyalgia. Mostly she is struggling with a lack of motivation. She confirms that she did tolerate the initial dose of Repatha. Recently she had been started on gabapentin as needed, and buspirone. Of note, she has allergic reactions to capsules and soft gels. This occurred with the gabapentin. She denies any palpitations, chest pain, shortness of breath, or peripheral edema.  No lightheadedness, headaches, syncope, orthopnea, or PND.   Previous antihypertensives: Lisinopril - rash Maxide Amlodipine - swelling Hydralazine HCTZ - rash  Past Medical History:  Diagnosis Date   Allergy Don't remember   Don't remember  Anxiety    Arthritis In 2007   In both of my shoulders and my knees   Depression 2002   Depression    Phreesia 06/28/2020   Depression    Phreesia 07/23/2020   Encounter for annual physical exam 12/03/2015   Fibromyalgia    GERD (gastroesophageal reflux disease) 01/23/2022   Heart murmur Don't remember    I was in 20s   Hypertension 2004   Medial meniscus tear 04/2013   left   Migraines     Past Surgical History:  Procedure Laterality Date   [place] N/A    Phreesia 06/28/2020   COLONOSCOPY  02/05/2009   COLONOSCOPY N/A 03/06/2014   Procedure: COLONOSCOPY;  Surgeon: Danie Binder, MD;  [facility];  Service: Endoscopy;  Laterality: N/A;  10:30 AM   COLONOSCOPY WITH PROPOFOL N/A 01/14/2019   Surgeon: Danie Binder, MD;  one 3 mm tubular adenoma in the mid ascending colon, moderate diverticulosis in entire colon, external and internal hemorrhoids, tortuous left colon. Recommended repeat in 5 years.    FINGER SURGERY Right    long finger   HERNIA REPAIR N/A    Phreesia 06/28/2020   JOINT REPLACEMENT N/A    Phreesia 06/28/2020   KNEE ARTHROSCOPY WITH MEDIAL MENISECTOMY Left 04/24/2013   Procedure: LEFT KNEE ARTHROSCOPY CHONDROPLASTY WITH MEDIAL MENISECTOMY;  Surgeon: Ninetta Lights, MD;  Location: [facility];  Service: Orthopedics;  Laterality: Left;   SHOULDER ARTHROSCOPY Left 2009   SHOULDER ARTHROSCOPY Right    SHOULDER ARTHROSCOPY WITH ROTATOR CUFF REPAIR Left 11/25/2004   TOTAL KNEE ARTHROPLASTY Left 11/11/2014   Procedure: LEFT TOTAL KNEE ARTHROPLASTY;  Surgeon: Kathryne Hitch, MD;  Location: [facility];  Service: Orthopedics;  Laterality: Left;   TUBAL LIGATION  0000000   UMBILICAL HERNIA REPAIR  2008     Current Outpatient Medications  Medication Sig Dispense Refill   amLODipine (NORVASC) 10 MG tablet Take 1 tablet by mouth once daily 90 tablet 3   AREXVY 120 MCG/0.5ML injection      busPIRone (BUSPAR) 5 MG tablet Take 5 mg by mouth 2 (two) times daily.     clotrimazole-betamethasone (LOTRISONE) cream Apply 1 Application topically 2 (two) times daily. 30 g 1   diphenhydrAMINE (BENADRYL) 25 mg capsule Take 25 mg by mouth every 6 (six) hours as needed.     Evolocumab  (REPATHA SURECLICK) [id number] MG/ML SOAJ Inject 140 mg into the skin every 14 (fourteen) days. 2 mL 3   FLUARIX QUADRIVALENT 0.5 ML injection      gabapentin (NEURONTIN) 100 MG capsule TAKE 1 TO 2 CAPSULES BY MOUTH AT BEDTIME FOR PAIN 60 capsule 0   ibuprofen (ADVIL) 200 MG tablet Take 200 mg by mouth every 6 (six) hours as needed.     Misc Natural Products (AIRBORNE ELDERBERRY) CHEW Chew 1 each by mouth daily.     Multiple Vitamins-Minerals (HAIR SKIN AND NAILS FORMULA) TABS Take 3 tablets by mouth daily.     nebivolol (BYSTOLIC) 10 MG tablet Take 1 tablet (10 mg total) by mouth daily. 30 tablet 5   nystatin-triamcinolone ointment (MYCOLOG) Apply 1 Application topically 2 (two) times daily. 60 g 1   Specialty Vitamins Products (COLLAGEN ULTRA) CAPS Take 1 each by mouth daily.     SPIKEVAX syringe      spironolactone (ALDACTONE) 25 MG tablet Take 25 mg by  mouth daily.     UNABLE TO FIND TDAP Vaccine DX: Z23 1 each 0   Current Facility-Administered Medications  Medication Dose Route Frequency Provider Last Rate Last Admin   sodium chloride flush (NS) 0.9 % injection 3 mL  3 mL Intravenous Q12H Skeet Latch, MD        Allergies:   Lisinopril, Maxzide [hydrochlorothiazide w-triamterene], Other, Buspar [buspirone], Beano meltaways [alpha-d-galactosidase], Crestor [rosuvastatin calcium], Hydrochlorothiazide, Paxil [paroxetine], and Simethicone    Social History:  The patient  reports that she has never smoked. She has never used smokeless tobacco. She reports that she does not currently use alcohol. She reports that she does not use drugs.   Family History:  The patient's ***family history includes Alcohol abuse in her father; Bone cancer in her mother; Cancer in her maternal aunt, sister, and sister; Colon cancer in her sister; Heart attack in her maternal grandmother; Hypertension in her father, mother, and sister; Migraines in her sister; Seizures in her father; Thyroid disease in her sister and  sister.    ROS:  General:no colds or fevers, no weight changes Skin:no rashes or ulcers HEENT:no blurred vision, no congestion CV:see HPI PUL:see HPI GI:no diarrhea constipation or melena, no indigestion GU:no hematuria, no dysuria MS:no joint pain, no claudication Neuro:no syncope, no lightheadedness Endo:no diabetes, no thyroid disease Wt Readings from Last 3 Encounters:  09/22/22 155 lb (70.3 kg)  09/14/22 153 lb (69.4 kg)  09/04/22 154 lb 3.2 oz (69.9 kg)     PHYSICAL EXAM: VS:  There were no vitals taken for this visit. , BMI There is no height or weight on file to calculate BMI. General:Pleasant affect, NAD Skin:Warm and dry, brisk capillary refill HEENT:normocephalic, sclera clear, mucus membranes moist Neck:supple, no JVD, no bruits  Heart:S1S2 RRR without murmur, gallup, rub or click Lungs:clear without rales, rhonchi, or wheezes [id number], non tender, + BS, do not palpate liver spleen or masses Ext:no lower ext edema, 2+ pedal pulses, 2+ radial pulses Neuro:alert and oriented, MAE, follows commands, + facial symmetry    EKG:  EKG is ordered today. The ekg ordered today demonstrates ***   Recent Labs: 01/30/2022: ALT 17; Hemoglobin 13.5; Platelets 426; TSH 5.010 02/03/2022: BUN 9; Creatinine, Ser 0.88; Potassium 4.9; Sodium 137    Lipid Panel    Component Value Date/Time   CHOL 223 (H) 01/30/2022 0827   TRIG 85 01/30/2022 0827   HDL 61 01/30/2022 0827   CHOLHDL 3.7 01/30/2022 0827   CHOLHDL 4.4 10/03/2019 0809   VLDL 20 10/07/2015 0808   LDLCALC 147 (H) 01/30/2022 0827   LDLCALC 148 (H) 10/03/2019 0809       Other studies Reviewed: Additional studies/ records that were reviewed today include: ***.  Echocardiogram  02/16/2021:  1. Left ventricular ejection fraction, by estimation, is 60 to 65%. The  left ventricle has normal function. The left ventricle has no regional  wall motion abnormalities. Left ventricular diastolic parameters were  normal.    2. Right ventricular systolic function is normal. The right ventricular  size is normal. Tricuspid regurgitation signal is inadequate for assessing  PA pressure.   3. There is a trivial pericardial effusion posterior to the left  ventricle.   4. The mitral valve is grossly normal. Mild mitral valve regurgitation.   5. The aortic valve is tricuspid. Aortic valve regurgitation is trivial.  Aortic valve mean gradient measures 6.0 mmHg. No aortic stenosis.   6. The inferior vena cava is normal in size with greater than  50%  respiratory variability, suggesting right atrial pressure of 3 mmHg.   Comparison(s): No prior Echocardiogram.    Nuclear Stress Myoview  02/16/2021: Patient exercised for 3 minutes achieving a maximum workload of 4.6 METS and MPHR 95%. No chest pain reported, exercise was limited by knee pain. There were no diagnostic ST segment changes to indicate ischemia. No significant arrhythmias. Calculated Duke treadmill score of 3 is intermediate risk, mainly based on exercise time alone. Blood pressure demonstrated a normal response to exercise. No significant myocardial perfusion defects to indicate scar or ischemia. This is a low risk study based on normal perfusion. Nuclear stress EF: 74%.   ASSESSMENT AND PLAN:  1.  ***   Current medicines are reviewed with the patient today.  The patient Has no concerns regarding medicines.  The following changes have been made:  See above Labs/ tests ordered today include:see above  Disposition:   FU:  see above  Signed, Cecilie Kicks, NP  10/04/2022 8:55 PM    [facility] [place], [place], [place], [place] Phone: [phone]; Fax: [phone]

## 2022-10-05 ENCOUNTER — Ambulatory Visit: Payer: Medicare Other | Admitting: Cardiology

## 2022-10-12 ENCOUNTER — Encounter: Payer: Self-pay | Admitting: *Deleted

## 2022-10-12 NOTE — Telephone Encounter (Signed)
Dr. Fletcher Anon has called the patient and she is ready to procedure. Instructions have been provided and will be mailed plus sent to [facility].    [facility] DE[facility] OF [facility] [place], [place] [address] Dept: [phone] Loc: [phone]  Shirley Bush  10/12/2022  You are scheduled for a Peripheral Angiogram on Wednesday, April 24 with Dr. Kathlyn Sacramento.  1. Please arrive at the [facility] (Main Entrance A) at [facility]: [address] [place], [place] [address] at 8:30 AM (This time is two hours before your procedure to ensure your preparation). Free valet parking service is available.   Special note: Every effort is made to have your procedure done on time. Please understand that emergencies sometimes delay scheduled procedures.  2. Diet: Do not eat solid foods after midnight.  The patient may have clear liquids until 5am upon the day of the procedure.  3. Labs: You will need to have blood drawn by 11/01/22. You do not need to be fasting.   You may also go to any of these [facility] locations:   [place] ([facility]) - [id number]   [place] - [address] - [id number]   4. Medication instructions in preparation for your procedure: Hold spironolactone the morning of the procedure  On the morning of your procedure, take your Aspirin 81 mg and any morning medicines NOT listed above.  You may use sips of water.  5. Plan for one night stay--bring personal belongings. 6. Bring a current list of your medications and current insurance cards. 7. You MUST have a responsible person to drive you home. 8. Someone MUST be with you the first 24 hours after you arrive home or your discharge will be delayed. 9. Please wear clothes that are easy to get on and off  and wear slip-on shoes.  Thank you for allowing [place] to care for you!   -- [facility]

## 2022-10-12 NOTE — Telephone Encounter (Signed)
I called the patient and discussed the procedure in details with her as well as risks and benefits.

## 2022-10-30 ENCOUNTER — Telehealth: Payer: Self-pay | Admitting: Family

## 2022-10-30 DIAGNOSIS — I10 Essential (primary) hypertension: Secondary | ICD-10-CM | POA: Diagnosis not present

## 2022-10-30 NOTE — Telephone Encounter (Signed)
Returned call to patient,   Patient states she hasn't been able to start the Repatha due to cost. Advised patient that we can talk about financing options at her appointment. She is scheduled for her renal denervation on 4/24.

## 2022-10-30 NOTE — Telephone Encounter (Signed)
Pt c/o medication issue:  1. Name of Medication:   Evolocumab (REPATHA SURECLICK) 140 MG/ML SOAJ    2. How are you currently taking this medication (dosage and times per day)? Not taking  3. Are you having a reaction (difficulty breathing--STAT)? No   4. What is your medication issue?  Pt states she hasn't been able to take this medication and she wants to know if Luther Parody, NP still wants to see her this Thursday. Please advise.

## 2022-10-31 ENCOUNTER — Ambulatory Visit: Payer: Medicare Other | Attending: Cardiovascular Disease

## 2022-10-31 DIAGNOSIS — Z0000 Encounter for general adult medical examination without abnormal findings: Secondary | ICD-10-CM

## 2022-10-31 LAB — CBC WITH DIFFERENTIAL/PLATELET
Basophils Absolute: 0.1 10*3/uL (ref 0.0–0.2)
Basos: 1 %
EOS (ABSOLUTE): 0.2 10*3/uL (ref 0.0–0.4)
Eos: 3 %
Hematocrit: 40.4 % (ref 34.0–46.6)
Hemoglobin: 13.3 g/dL (ref 11.1–15.9)
Immature Grans (Abs): 0 10*3/uL (ref 0.0–0.1)
Immature Granulocytes: 0 %
Lymphocytes Absolute: 2.1 10*3/uL (ref 0.7–3.1)
Lymphs: 28 %
MCH: 30.2 pg (ref 26.6–33.0)
MCHC: 32.9 g/dL (ref 31.5–35.7)
MCV: 92 fL (ref 79–97)
Monocytes Absolute: 0.7 10*3/uL (ref 0.1–0.9)
Monocytes: 10 %
Neutrophils Absolute: 4.3 10*3/uL (ref 1.4–7.0)
Neutrophils: 58 %
Platelets: 417 10*3/uL (ref 150–450)
RBC: 4.4 x10E6/uL (ref 3.77–5.28)
RDW: 12.2 % (ref 11.7–15.4)
WBC: 7.3 10*3/uL (ref 3.4–10.8)

## 2022-10-31 LAB — BASIC METABOLIC PANEL
BUN/Creatinine Ratio: 10 — ABNORMAL LOW (ref 12–28)
BUN: 9 mg/dL (ref 8–27)
CO2: 23 mmol/L (ref 20–29)
Calcium: 10.2 mg/dL (ref 8.7–10.3)
Chloride: 102 mmol/L (ref 96–106)
Creatinine, Ser: 0.92 mg/dL (ref 0.57–1.00)
Glucose: 102 mg/dL — ABNORMAL HIGH (ref 70–99)
Potassium: 5.1 mmol/L (ref 3.5–5.2)
Sodium: 139 mmol/L (ref 134–144)
eGFR: 70 mL/min/{1.73_m2} (ref >59)

## 2022-10-31 NOTE — Progress Notes (Signed)
Appointment Outcome: Completed, Session #: final                         Start time: 11:00am   End time: 11:27am   Total Mins: 27 minutes  AGREEMENTS SECTION   Overall Goal(s): Stress management                                            Agreement/Action Steps:  Stress management Establish healthy boundaries Practice deep breathing Conduct self-check-ins Implement positive self-talk Engage in daily devotional Seek a counselor    Progress Notes:  Patient shared that her stressors have not changed over the past few months. Patient reported that her stress level is a 5/10 and attributes to have a lower stress level than normal due to being able to take a vacation and trying to focus on taking care of herself. Patient shared that she has breathe and deal with situations as they come. Patient has been able to reduce her reported stress level from a 10/10 over the past few months.   Patient stated that she has conducted self-check-ins and reflected on her responses to others. Patient shared that she is still implementing healthy boundaries and do not have an issue communicating or saying "no" when necessary. Patient shared that she has shifted her mindset on how she perceives stress, which is stemming from other issues that are close to her. Patient shared that she focuses on controlling how she treat people regardless of the present stressors. Patient expressed that this practice is a way for her to place healthy boundaries on herself and how she responds to stressful interactions with others.   Patient shared a moment where she reflected on her response to a stressful event and was able to stop herself from responding with further action and communicated her thoughts and feelings instead of reacting out of emotion. Patient stated that she continues to pray about various issues and read scripture during her daily devotional time, which she finds to be the most effective strategy. Patient  expressed that she repeats scriptures to implement positive self-talk and encourage herself.   Patient shared that a personal stressor is that in her mind she should be working but was forced out of work due to disability. Patient is having to accept this change in her life. 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 Patient stated that her body is still used to being up all night and sleeping during the day, which was based on her third shift work schedule.   Patient shared that other stressors include her blood pressure not being control and her inability to sleep well. Patient expressed that she is hoping for some peace with these issues after she has her scheduled procedure. Patient mentioned that she was not able to get connected with a counselor after the referral was submitted. Patient stated that after the procedure she will focus on getting a counselor.    Indicators of Success and Accountability:  Patient has reduced her stress level from a 10/10 to a 5/10 over a few months by being consistent with implementing action steps.   Readiness: Patient is in the maintenance stage of stress management.  Strengths and Supports: Patient have selected family members for support. Patient is relying  on her faith and ability to consistently enact these steps to manage stress.   Challenges and Barriers: Patient is faced with stressors that are imposed upon her by others, which can make it challenging to manage her stress level.     Coaching Outcomes: Patient has completed her final health coaching session today over the phone. Patient was successfully discharged from the health coaching program.   Patient will continue to implement her action steps as outlined above to manage stress and will focus on getting counseling services after her procedure scheduled for this month.     Attempted: Fulfilled - Patient  completed the monthly agreement in full and was able to meet the challenge.

## 2022-11-02 ENCOUNTER — Ambulatory Visit (HOSPITAL_BASED_OUTPATIENT_CLINIC_OR_DEPARTMENT_OTHER): Payer: Medicare Other | Admitting: Family

## 2022-11-02 ENCOUNTER — Encounter (HOSPITAL_BASED_OUTPATIENT_CLINIC_OR_DEPARTMENT_OTHER): Payer: Self-pay | Admitting: Family

## 2022-11-02 VITALS — BP 156/85 | HR 63 | Ht 63.0 in | Wt 153.8 lb

## 2022-11-02 DIAGNOSIS — I10 Essential (primary) hypertension: Secondary | ICD-10-CM

## 2022-11-02 DIAGNOSIS — E782 Mixed hyperlipidemia: Secondary | ICD-10-CM | POA: Diagnosis not present

## 2022-11-02 MED ORDER — NEBIVOLOL HCL 10 MG PO TABS: 10.0000 mg | ORAL_TABLET | Freq: Every day | ORAL | 1 refills | Status: DC

## 2022-11-02 MED ORDER — AMLODIPINE BESYLATE 10 MG PO TABS: 10.0000 mg | ORAL_TABLET | Freq: Every day | ORAL | 1 refills | Status: DC

## 2022-11-02 NOTE — Patient Instructions (Signed)
Medication Instructions:  Your physician recommends that you continue on your current medications as directed. Please refer to the Current Medication list given to you today.    Follow-Up: 6 weeks in [facility] with Dr. Duke Salvia

## 2022-11-02 NOTE — H&P (View-Only) (Signed)
 [facility] Initial Assessment:    Date:  11/02/2022   ID:  Shirley Bush, DOB 11/09/1955, MRN [id number]  PCP:  Simpson, Margaret E, MD  Cardiologist:  None  Nephrologist:  Referring MD: Simpson, Margaret E, MD   CC: Hypertension  History of Present Illness:    Shirley Bush is a 66 y.o. female with a hx of *** here to establish care in the [facility].   She has not been checking BP routinely at home.  Previous antihypertensives:   Past Medical History:  Diagnosis Date   Allergy Don't remember   Don't remember   Anxiety    Arthritis In 2007   In both of my shoulders and my knees   Depression 2002   Depression    Phreesia 06/28/2020   Depression    Phreesia 07/23/2020   Encounter for annual physical exam 12/03/2015   Fibromyalgia    GERD (gastroesophageal reflux disease) 01/23/2022   Heart murmur Don't remember   I was in 20s   Hypertension 2004   Medial meniscus tear 04/2013   left   Migraines     Past Surgical History:  Procedure Laterality Date   CESAREAN SECTION  1978   CESAREAN SECTION  1980   CESAREAN SECTION  1992   CESAREAN SECTION N/A    Phreesia 06/28/2020   COLONOSCOPY  02/05/2009   COLONOSCOPY N/A 03/06/2014   Procedure: COLONOSCOPY;  Surgeon: Sandi L Fields, MD;  [facility];  Service: Endoscopy;  Laterality: N/A;  10:30 AM   COLONOSCOPY WITH PROPOFOL N/A 01/14/2019   Surgeon: Fields, Sandi L, MD;  one 3 mm tubular adenoma in the mid ascending colon, moderate diverticulosis in entire colon, external and internal hemorrhoids, tortuous left colon. Recommended repeat in 5 years.    FINGER SURGERY Right    long finger   HERNIA REPAIR N/A    Phreesia 06/28/2020   JOINT REPLACEMENT N/A    Phreesia 06/28/2020   KNEE ARTHROSCOPY WITH MEDIAL MENISECTOMY Left 04/24/2013   Procedure: LEFT KNEE ARTHROSCOPY CHONDROPLASTY WITH MEDIAL MENISECTOMY;  Surgeon: Daniel F Murphy, MD;  Location: [facility];  Service: Orthopedics;  Laterality: Left;   SHOULDER ARTHROSCOPY Left 2009   SHOULDER ARTHROSCOPY Right    SHOULDER ARTHROSCOPY WITH ROTATOR CUFF REPAIR Left 11/25/2004   TOTAL KNEE ARTHROPLASTY Left 11/11/2014   Procedure: LEFT TOTAL KNEE ARTHROPLASTY;  Surgeon: Daniel Murphy, MD;  Location: [facility];  Service: Orthopedics;  Laterality: Left;   TUBAL LIGATION  1992   UMBILICAL HERNIA REPAIR  2008    Current Medications: Current Meds  Medication Sig   amLODipine (NORVASC) 10 MG tablet Take 1 tablet by mouth once daily   AREXVY 120 MCG/0.5ML injection    busPIRone (BUSPAR) 5 MG tablet Take 5 mg by mouth 2 (two) times daily.   clotrimazole-betamethasone (LOTRISONE) cream Apply 1 Application topically 2 (two) times daily.   diphenhydrAMINE (BENADRYL) 25 mg capsule Take 25 mg by mouth every 6 (six) hours as needed.   FLUARIX QUADRIVALENT 0.5 ML injection    gabapentin (NEURONTIN) 100 MG capsule TAKE 1 TO 2 CAPSULES BY MOUTH AT BEDTIME FOR PAIN   ibuprofen (ADVIL) 200 MG tablet Take 200 mg by mouth every 6 (six) hours as needed.   Misc Natural Products (AIRBORNE ELDERBERRY) CHEW Chew 1 each by mouth daily.   Multiple Vitamins-Minerals (HAIR SKIN AND NAILS FORMULA) TABS Take 3 tablets by mouth daily.   nebivolol (BYSTOLIC) 10 MG   tablet Take 1 tablet (10 mg total) by mouth daily.   nystatin-triamcinolone ointment (MYCOLOG) Apply 1 Application topically 2 (two) times daily.   Specialty Vitamins Products (COLLAGEN ULTRA) CAPS Take 1 each by mouth daily.   SPIKEVAX syringe    spironolactone (ALDACTONE) 25 MG tablet Take 25 mg by mouth daily.   UNABLE TO FIND TDAP Vaccine DX: Z23   Current Facility-Administered Medications for the 11/02/22 encounter (Office Visit) with Jizel Cheeks S, NP  Medication   sodium chloride flush (NS) 0.9 % injection 3 mL     Allergies:   Lisinopril, Maxzide [hydrochlorothiazide w-triamterene], Other, Buspar [buspirone], Beano meltaways  [alpha-d-galactosidase], Crestor [rosuvastatin calcium], Hydrochlorothiazide, Paxil [paroxetine], and Simethicone   Social History   Socioeconomic History   Marital status: Divorced    Spouse name: Not on file   Number of children: 3   Years of education: 12 grade    Highest education level: 12th grade  Occupational History   Occupation: disability   Tobacco Use   Smoking status: Never   Smokeless tobacco: Never  Vaping Use   Vaping Use: Never used  Substance and Sexual Activity   Alcohol use: Not Currently    Comment: occasionally- rarely.    Drug use: No   Sexual activity: Not Currently    Birth control/protection: None, Post-menopausal  Other Topics Concern   Not on file  Social History Narrative   Son is living with her at the moment, patient states that she is isolating in her home and is depressed more lately    Social Determinants of Health   Financial Resource Strain: Low Risk  (08/07/2022)   Overall Financial Resource Strain (CARDIA)    Difficulty of Paying Living Expenses: Not hard at all  Food Insecurity: No Food Insecurity (08/07/2022)   Hunger Vital Sign    Worried About Running Out of Food in the Last Year: Never true    Ran Out of Food in the Last Year: Never true  Transportation Needs: No Transportation Needs (08/07/2022)   PRAPARE - Transportation    Lack of Transportation (Medical): No    Lack of Transportation (Non-Medical): No  Physical Activity: Inactive (08/07/2022)   Exercise Vital Sign    Days of Exercise per Week: 0 days    Minutes of Exercise per Session: 0 min  Stress: Stress Concern Present (08/07/2022)   [employer] - Occupational Stress Questionnaire    Feeling of Stress : Very much  Social Connections: Socially Isolated (08/07/2022)   Social Connection and Isolation Panel [NHANES]    Frequency of Communication with Friends and Family: More than three times a week    Frequency of Social Gatherings with Friends and  Family: Once a week    Attends Religious Services: Never    Active Member of Clubs or Organizations: No    Attends Club or Organization Meetings: Never    Marital Status: Divorced     Family History: The patient's ***family history includes Alcohol abuse in her father; Bone cancer in her mother; Cancer in her maternal aunt, sister, and sister; Colon cancer in her sister; Heart attack in her maternal grandmother; Hypertension in her father, mother, and sister; Migraines in her sister; Seizures in her father; Thyroid disease in her sister and sister.  ROS:   Please see the history of present illness.    *** All other systems reviewed and are negative.  EKGs/Labs/Other Studies Reviewed:    EKG:  EKG is *** ordered today.  The   ekg ordered today demonstrates ***  Recent Labs: 01/30/2022: ALT 17; TSH 5.010 10/30/2022: BUN 9; Creatinine, Ser 0.92; Hemoglobin 13.3; Platelets 417; Potassium 5.1; Sodium 139   Recent Lipid Panel    Component Value Date/Time   CHOL 223 (H) 01/30/2022 0827   TRIG 85 01/30/2022 0827   HDL 61 01/30/2022 0827   CHOLHDL 3.7 01/30/2022 0827   CHOLHDL 4.4 10/03/2019 0809   VLDL 20 10/07/2015 0808   LDLCALC 147 (H) 01/30/2022 0827   LDLCALC 148 (H) 10/03/2019 0809    Physical Exam:   VS:  BP (!) 156/85 (BP Location: Left Arm, Patient Position: Sitting, Cuff Size: Normal)   Pulse 63   Ht 5' 3" (1.6 m)   Wt 153 lb 12.8 oz (69.8 kg)   SpO2 99%   BMI 27.24 kg/m  , BMI Body mass index is 27.24 kg/m. GENERAL:  Well appearing HEENT: Pupils equal round and reactive, fundi not visualized, oral mucosa unremarkable NECK:  No jugular venous distention, waveform within normal limits, carotid upstroke brisk and symmetric, no bruits, no thyromegaly LYMPHATICS:  No cervical adenopathy LUNGS:  Clear to auscultation bilaterally HEART:  RRR.  PMI not displaced or sustained,S1 and S2 within normal limits, no S3, no S4, no clicks, no rubs, *** murmurs ABD:  Flat, positive  bowel sounds normal in frequency in pitch, no bruits, no rebound, no guarding, no midline pulsatile mass, no hepatomegaly, no splenomegaly EXT:  2 plus pulses throughout, no edema, no cyanosis no clubbing SKIN:  No rashes no nodules NEURO:  Cranial nerves II through XII grossly intact, motor grossly intact throughout PSYCH:  Cognitively intact, oriented to person place and time   ASSESSMENT/PLAN:    No problem-specific Assessment & Plan notes found for this encounter.   Screening for Secondary Hypertension: { Click here to document screening for secondary causes of HTN  :[id number]}    01/23/2022    1:52 PM  Causes  Drugs/Herbals Screened     - Comments Limits sodium intake.  Caffeine tablet and coffee.  Some NSAIDS  Sleep Apnea Screened     - Comments snores but no apnea  Thyroid Disease Screened  Hyperaldosteronism N/A  Pheochromocytoma N/A  Cushing's Syndrome N/A  Hyperparathyroidism N/A  Coarctation of the Aorta Screened     - Comments BP symmetric  Compliance Screened    Relevant Labs/Studies:    Latest Ref Rng & Units 10/30/2022    8:15 AM 02/03/2022    9:09 AM 01/30/2022    8:27 AM  Basic Labs  Sodium 134 - 144 mmol/L 139  137  138   Potassium 3.5 - 5.2 mmol/L 5.1  4.9  4.5   Creatinine 0.57 - 1.00 mg/dL 0.92  0.88  0.91        Latest Ref Rng & Units 01/30/2022    8:27 AM 01/24/2021    9:16 AM  Thyroid   TSH 0.450 - 4.500 uIU/mL 5.010  3.930                     she consents to be monitored in our remote patient monitoring program through [employer].  she will track his blood pressure twice daily and understands that these trends will help us to adjust her medications as needed prior to his next appointment.  she *** interested in enrolling in the PREP exercise and nutrition program through the [employer].     Disposition:    FU with MD/PharmD in {gen number 0-10:310397} {Days to years:10300}      Medication Adjustments/Labs and Tests Ordered: Current medicines are  reviewed at length with the patient today.  Concerns regarding medicines are outlined above.  No orders of the defined types were placed in this encounter.  No orders of the defined types were placed in this encounter.    Signed, Deandrae Wajda S Arnav Cregg, NP  11/02/2022 11:36 AM    [facility]   cm AV Area (Vmean):   1.62 cm AV Area (VTI):     1.64 cm AV Vmax:           168.00 cm/s AV Vmean:          111.000 cm/s AV VTI:            0.355 m AV Peak Grad:      11.3 mmHg AV Mean Grad:      6.0 mmHg LVOT Vmax:         90.30 cm/s LVOT Vmean:        63.400 cm/s LVOT VTI:          0.205 m LVOT/AV VTI ratio: 0.58  AORTA Ao Root diam: 2.60 cm Ao Asc diam:  2.50 cm  MITRAL VALVE MV Area (PHT): 4.19 cm     SHUNTS MV Area VTI:   2.18 cm     Systemic VTI:  0.20 m MV Peak grad:  5.2 mmHg     Systemic Diam: 1.90 cm MV Mean grad:  2.0 mmHg MV Vmax:       1.14 m/s MV Vmean:      67.8 cm/s MV Decel Time: 181 msec MV E velocity: 69.20 cm/s MV A velocity: 100.00 cm/s MV E/A ratio:  0.69  Nona Dell  MD Electronically signed by Nona Dell MD Signature Date/Time: 02/16/2021/4:58:40 PM    Final              Recent Labs: 01/30/2022: ALT 17; TSH 5.010 10/30/2022: BUN 9; Creatinine, Ser 0.92; Hemoglobin 13.3; Platelets 417; Potassium 5.1; Sodium 139   Recent Lipid Panel    Component Value Date/Time   CHOL 223 (H) 01/30/2022 0827   TRIG 85 01/30/2022 0827   HDL 61 01/30/2022 0827   CHOLHDL 3.7 01/30/2022 0827   CHOLHDL 4.4 10/03/2019 0809   VLDL 20 10/07/2015 0808   LDLCALC 147 (H) 01/30/2022 0827   LDLCALC 148 (H) 10/03/2019 0809    Physical Exam:   VS:  BP (!) 156/85 (BP Location: Left Arm, Patient Position: Sitting, Cuff Size: Normal)   Pulse 63   Ht 5\' 3"  (1.6 m)   Wt 153 lb 12.8 oz (69.8 kg)   SpO2 99%   BMI 27.24 kg/m  , BMI Body mass index is 27.24 kg/m. GENERAL:  Well appearing HEENT: Pupils equal round and reactive, fundi not visualized, oral mucosa unremarkable NECK:  No jugular venous distention, waveform within normal limits, carotid upstroke brisk and symmetric, no bruits, no thyromegaly LYMPHATICS:  No cervical adenopathy LUNGS:  Clear to auscultation bilaterally HEART:  RRR.  PMI not displaced or sustained,S1 and S2 within normal limits, no S3, no S4, no clicks, no rubs, no murmurs ABD:  Flat, positive bowel sounds normal in frequency in pitch, no bruits, no rebound, no guarding, no midline pulsatile mass, no hepatomegaly, no splenomegaly EXT:  2 plus pulses throughout, no edema, no cyanosis no clubbing SKIN:  No rashes no nodules NEURO:  Cranial nerves II through XII grossly intact, motor grossly intact throughout PSYCH:  Cognitively intact, oriented to person place and time   ASSESSMENT/PLAN:    HTN -BP not at goal less than 130/80.  Struggles with intolerance to many medications.  Renal denervation upcoming next week.  Continue present dose amlodipine, Bystolic.  Refills provided.  In the future if needing reduced antihypertensive regimen as she has  previously experienced palpitations with reduced dose of amlodipine initially. Discussed to monitor BP at home at least 2 hours after  medications and sitting for 5-10 minutes.   HLD - Intolerant to statin. Did tolerate sample of Repatha with no side effects. However, $45 copay is difficult. Will route to pharmacy team to inquire about [employer]. Alternatively, may need to fill out PAP. She prefers to avoid resuming until after her renal denervation which is reasonable.  Screening for Secondary Hypertension:     01/23/2022    1:52 PM  Causes  Drugs/Herbals Screened     - Comments Limits sodium intake.  Caffeine tablet and coffee.  Some NSAIDS  Sleep Apnea Screened     - Comments snores but no apnea  Thyroid Disease Screened  Hyperaldosteronism N/A  Pheochromocytoma N/A  Cushing's Syndrome N/A  Hyperparathyroidism N/A  Coarctation of the Aorta Screened     - Comments BP symmetric  Compliance Screened    Relevant Labs/Studies:    Latest Ref Rng & Units 10/30/2022    8:15 AM 02/03/2022    9:09 AM 01/30/2022    8:27 AM  Basic Labs  Sodium 134 - 144 mmol/L 139  137  138   Potassium 3.5 - 5.2 mmol/L 5.1  4.9  4.5   Creatinine 0.57 - 1.00 mg/dL 4.09  8.11  9.14        Latest Ref Rng & Units 01/30/2022    8:27 AM 01/24/2021    9:16 AM  Thyroid   TSH 0.450 - 4.500 uIU/mL 5.010  3.930                  Disposition:    FU with MD/PharmD in 6 weeks    Medication Adjustments/Labs and Tests Ordered: Current medicines are reviewed at length with the patient today.  Concerns regarding medicines are outlined above.  No orders of the defined types were placed in this encounter.  No orders of the defined types were placed in this encounter.    Signed, Alver Sorrow, NP  11/02/2022 11:36 AM    [facility]

## 2022-11-02 NOTE — Progress Notes (Unsigned)
[facility] Initial Assessment:    Date:  11/02/2022   ID:  Shirley Bush, DOB 1956/06/03, MRN [id number]  PCP:  Kerri Perches, MD  Cardiologist:  None  Nephrologist:  Referring MD: Kerri Perches, MD   CC: Hypertension  History of Present Illness:    Shirley Bush is a 67 y.o. female with a hx of *** here to establish care in the [facility].   She has not been checking BP routinely at home.  Previous antihypertensives:   Past Medical History:  Diagnosis Date   Allergy Don't remember   Don't remember   Anxiety    Arthritis In 2007   In both of my shoulders and my knees   Depression 2002   Depression    Phreesia 06/28/2020   Depression    Phreesia 07/23/2020   Encounter for annual physical exam 12/03/2015   Fibromyalgia    GERD (gastroesophageal reflux disease) 01/23/2022   Heart murmur Don't remember   I was in 20s   Hypertension 2004   Medial meniscus tear 04/2013   left   Migraines     Past Surgical History:  Procedure Laterality Date   CESAREAN SECTION  1978   CESAREAN SECTION  1980   CESAREAN SECTION  1992   CESAREAN SECTION N/A    Phreesia 06/28/2020   COLONOSCOPY  02/05/2009   COLONOSCOPY N/A 03/06/2014   Procedure: COLONOSCOPY;  Surgeon: West Bali, MD;  [facility];  Service: Endoscopy;  Laterality: N/A;  10:30 AM   COLONOSCOPY WITH PROPOFOL N/A 01/14/2019   Surgeon: West Bali, MD;  one 3 mm tubular adenoma in the mid ascending colon, moderate diverticulosis in entire colon, external and internal hemorrhoids, tortuous left colon. Recommended repeat in 5 years.    FINGER SURGERY Right    long finger   HERNIA REPAIR N/A    Phreesia 06/28/2020   JOINT REPLACEMENT N/A    Phreesia 06/28/2020   KNEE ARTHROSCOPY WITH MEDIAL MENISECTOMY Left 04/24/2013   Procedure: LEFT KNEE ARTHROSCOPY CHONDROPLASTY WITH MEDIAL MENISECTOMY;  Surgeon: Loreta Ave, MD;  Location: [facility];  Service: Orthopedics;  Laterality: Left;   SHOULDER ARTHROSCOPY Left 2009   SHOULDER ARTHROSCOPY Right    SHOULDER ARTHROSCOPY WITH ROTATOR CUFF REPAIR Left 11/25/2004   TOTAL KNEE ARTHROPLASTY Left 11/11/2014   Procedure: LEFT TOTAL KNEE ARTHROPLASTY;  Surgeon: Mckinley Jewel, MD;  Location: [facility];  Service: Orthopedics;  Laterality: Left;   TUBAL LIGATION  1992   UMBILICAL HERNIA REPAIR  2008    Current Medications: Current Meds  Medication Sig   amLODipine (NORVASC) 10 MG tablet Take 1 tablet by mouth once daily   AREXVY 120 MCG/0.5ML injection    busPIRone (BUSPAR) 5 MG tablet Take 5 mg by mouth 2 (two) times daily.   clotrimazole-betamethasone (LOTRISONE) cream Apply 1 Application topically 2 (two) times daily.   diphenhydrAMINE (BENADRYL) 25 mg capsule Take 25 mg by mouth every 6 (six) hours as needed.   FLUARIX QUADRIVALENT 0.5 ML injection    gabapentin (NEURONTIN) 100 MG capsule TAKE 1 TO 2 CAPSULES BY MOUTH AT BEDTIME FOR PAIN   ibuprofen (ADVIL) 200 MG tablet Take 200 mg by mouth every 6 (six) hours as needed.   Misc Natural Products (AIRBORNE ELDERBERRY) CHEW Chew 1 each by mouth daily.   Multiple Vitamins-Minerals (HAIR SKIN AND NAILS FORMULA) TABS Take 3 tablets by mouth daily.   nebivolol (BYSTOLIC) 10 MG  tablet Take 1 tablet (10 mg total) by mouth daily.   nystatin-triamcinolone ointment (MYCOLOG) Apply 1 Application topically 2 (two) times daily.   Specialty Vitamins Products (COLLAGEN ULTRA) CAPS Take 1 each by mouth daily.   SPIKEVAX syringe    spironolactone (ALDACTONE) 25 MG tablet Take 25 mg by mouth daily.   UNABLE TO FIND TDAP Vaccine DX: Z23   Current Facility-Administered Medications for the 11/02/22 encounter (Office Visit) with Alver Sorrow, NP  Medication   sodium chloride flush (NS) 0.9 % injection 3 mL     Allergies:   Lisinopril, Maxzide [hydrochlorothiazide w-triamterene], Other, Buspar [buspirone], Beano meltaways  [alpha-d-galactosidase], Crestor [rosuvastatin calcium], Hydrochlorothiazide, Paxil [paroxetine], and Simethicone   Social History   Socioeconomic History   Marital status: Divorced    Spouse name: Not on file   Number of children: 3   Years of education: 12 grade    Highest education level: 12th grade  Occupational History   Occupation: disability   Tobacco Use   Smoking status: Never   Smokeless tobacco: Never  Vaping Use   Vaping Use: Never used  Substance and Sexual Activity   Alcohol use: Not Currently    Comment: occasionally- rarely.    Drug use: No   Sexual activity: Not Currently    Birth control/protection: None, Post-menopausal  Other Topics Concern   Not on file  Social History Narrative   Son is living with her at the moment, patient states that she is isolating in her home and is depressed more lately    Social Determinants of Health   Financial Resource Strain: Low Risk  (08/07/2022)   Overall Financial Resource Strain (CARDIA)    Difficulty of Paying Living Expenses: Not hard at all  Food Insecurity: No Food Insecurity (08/07/2022)   Hunger Vital Sign    Worried About Running Out of Food in the Last Year: Never true    Ran Out of Food in the Last Year: Never true  Transportation Needs: No Transportation Needs (08/07/2022)   PRAPARE - Administrator, Civil Service (Medical): No    Lack of Transportation (Non-Medical): No  Physical Activity: Inactive (08/07/2022)   Exercise Vital Sign    Days of Exercise per Week: 0 days    Minutes of Exercise per Session: 0 min  Stress: Stress Concern Present (08/07/2022)   [employer] of Occupational Health - Occupational Stress Questionnaire    Feeling of Stress : Very much  Social Connections: Socially Isolated (08/07/2022)   Social Connection and Isolation Panel [NHANES]    Frequency of Communication with Friends and Family: More than three times a week    Frequency of Social Gatherings with Friends and  Family: Once a week    Attends Religious Services: Never    Database administrator or Organizations: No    Attends Engineer, structural: Never    Marital Status: Divorced     Family History: The patient's ***family history includes Alcohol abuse in her father; Bone cancer in her mother; Cancer in her maternal aunt, sister, and sister; Colon cancer in her sister; Heart attack in her maternal grandmother; Hypertension in her father, mother, and sister; Migraines in her sister; Seizures in her father; Thyroid disease in her sister and sister.  ROS:   Please see the history of present illness.    *** All other systems reviewed and are negative.  EKGs/Labs/Other Studies Reviewed:    EKG:  EKG is *** ordered today.  The  ekg ordered today demonstrates ***  Recent Labs: 01/30/2022: ALT 17; TSH 5.010 10/30/2022: BUN 9; Creatinine, Ser 0.92; Hemoglobin 13.3; Platelets 417; Potassium 5.1; Sodium 139   Recent Lipid Panel    Component Value Date/Time   CHOL 223 (H) 01/30/2022 0827   TRIG 85 01/30/2022 0827   HDL 61 01/30/2022 0827   CHOLHDL 3.7 01/30/2022 0827   CHOLHDL 4.4 10/03/2019 0809   VLDL 20 10/07/2015 0808   LDLCALC 147 (H) 01/30/2022 0827   LDLCALC 148 (H) 10/03/2019 0809    Physical Exam:   VS:  BP (!) 156/85 (BP Location: Left Arm, Patient Position: Sitting, Cuff Size: Normal)   Pulse 63   Ht  (1.6 m)   Wt 153 lb 12.8 oz (69.8 kg)   SpO2 99%   BMI 27.24 kg/m  , BMI Body mass index is 27.24 kg/m. GENERAL:  Well appearing HEENT: Pupils equal round and reactive, fundi not visualized, oral mucosa unremarkable NECK:  No jugular venous distention, waveform within normal limits, carotid upstroke brisk and symmetric, no bruits, no thyromegaly LYMPHATICS:  No cervical adenopathy LUNGS:  Clear to auscultation bilaterally HEART:  RRR.  PMI not displaced or sustained,S1 and S2 within normal limits, no S3, no S4, no clicks, no rubs, *** murmurs ABD:  Flat, positive  bowel sounds normal in frequency in pitch, no bruits, no rebound, no guarding, no midline pulsatile mass, no hepatomegaly, no splenomegaly EXT:  2 plus pulses throughout, no edema, no cyanosis no clubbing SKIN:  No rashes no nodules NEURO:  Cranial nerves II through XII grossly intact, motor grossly intact throughout PSYCH:  Cognitively intact, oriented to person place and time   ASSESSMENT/PLAN:    No problem-specific Assessment & Plan notes found for this encounter.   Screening for Secondary Hypertension: { Click here to document screening for secondary causes of HTN  :[id number]}    01/23/2022    1:52 PM  Causes  Drugs/Herbals Screened     - Comments Limits sodium intake.  Caffeine tablet and coffee.  Some NSAIDS  Sleep Apnea Screened     - Comments snores but no apnea  Thyroid Disease Screened  Hyperaldosteronism N/A  Pheochromocytoma N/A  Cushing's Syndrome N/A  Hyperparathyroidism N/A  Coarctation of the Aorta Screened     - Comments BP symmetric  Compliance Screened    Relevant Labs/Studies:    Latest Ref Rng & Units 10/30/2022    8:15 AM 02/03/2022    9:09 AM 01/30/2022    8:27 AM  Basic Labs  Sodium 134 - 144 mmol/L 139  137  138   Potassium 3.5 - 5.2 mmol/L 5.1  4.9  4.5   Creatinine 0.57 - 1.00 mg/dL 7.82  9.56  2.13        Latest Ref Rng & Units 01/30/2022    8:27 AM 01/24/2021    9:16 AM  Thyroid   TSH 0.450 - 4.500 uIU/mL 5.010  3.930                     she consents to be monitored in our remote patient monitoring program through [employer].  she will track his blood pressure twice daily and understands that these trends will help [place] to adjust her medications as needed prior to his next appointment.  she *** interested in enrolling in the PREP exercise and nutrition program through the [facility].     Disposition:    FU with MD/PharmD in {gen number [id number]} {Days to years:10300}  Medication Adjustments/Labs and Tests Ordered: Current medicines are  reviewed at length with the patient today.  Concerns regarding medicines are outlined above.  No orders of the defined types were placed in this encounter.  No orders of the defined types were placed in this encounter.    Signed, Alver Sorrow, NP  11/02/2022 11:36 AM    [facility]

## 2022-11-06 ENCOUNTER — Encounter (HOSPITAL_BASED_OUTPATIENT_CLINIC_OR_DEPARTMENT_OTHER): Payer: Self-pay | Admitting: Family

## 2022-11-07 ENCOUNTER — Telehealth: Payer: Self-pay | Admitting: *Deleted

## 2022-11-07 NOTE — Telephone Encounter (Signed)
Attempted to contact patient to review procedure instructions, message cannot complete call.

## 2022-11-07 NOTE — Telephone Encounter (Addendum)
[facility] [place] [facility] time [facility] Main Entrance A at: 8:30 AM  Nothing to eat after midnight prior to procedure, clear liquids until 5 AM day of procedur  Medication instructions: -Usual morning medications can be taken with sips of water including aspirin 81 mg.  Confirmed patient has responsible adult to drive home post procedure and be with patient first 24 hours after arriving home.  Plan to go home the same day, you will only stay overnight if medically necessary  Call placed to patient to review procedure instructions, message call cannot be completed.

## 2022-11-07 NOTE — Telephone Encounter (Signed)
Several unsuccessful attempts to reach patient at number listed. I spoke with Teia, patient's daughter-she reports patient is having problems with phone voicemail, she has also been unable to reach patient this afternoon. Teia states she will be taking patient to hospital in morning and patient will be at her house this evening around 6-7 PM. I reviewed procedure instructions with Teia, she will review with patient this evening.

## 2022-11-08 ENCOUNTER — Ambulatory Visit (HOSPITAL_COMMUNITY)
Admission: RE | Admit: 2022-11-08 | Discharge: 2022-11-08 | Disposition: A | Discharge status: Home / Self Care | Payer: Medicare Other | Attending: Cardiovascular Disease | Admitting: Cardiovascular Disease

## 2022-11-08 ENCOUNTER — Encounter (HOSPITAL_COMMUNITY)
Admission: RE | Disposition: A | Discharge status: Home / Self Care | Payer: Self-pay | Source: Home / Self Care | Attending: Cardiovascular Disease

## 2022-11-08 ENCOUNTER — Other Ambulatory Visit: Payer: Self-pay

## 2022-11-08 DIAGNOSIS — E785 Hyperlipidemia, unspecified: Secondary | ICD-10-CM | POA: Insufficient documentation

## 2022-11-08 DIAGNOSIS — I10 Essential (primary) hypertension: Secondary | ICD-10-CM | POA: Insufficient documentation

## 2022-11-08 DIAGNOSIS — I1A0 Resistant hypertension: Secondary | ICD-10-CM | POA: Insufficient documentation

## 2022-11-08 HISTORY — PX: RENAL DENERVATION: CATH118329

## 2022-11-08 LAB — POCT ACTIVATED CLOTTING TIME
Activated Clotting Time: 298 seconds
Activated Clotting Time: 266 seconds

## 2022-11-08 SURGERY — RENAL DENERVATION

## 2022-11-08 MED ORDER — LIDOCAINE HCL (PF) 1 % IJ SOLN
INTRAMUSCULAR | Status: AC
  Filled 2022-11-08: qty 30

## 2022-11-08 MED ORDER — NITROGLYCERIN 1 MG/10 ML FOR IR/CATH LAB
INTRA_ARTERIAL | Status: AC
  Filled 2022-11-08: qty 10

## 2022-11-08 MED ORDER — ACETAMINOPHEN 325 MG PO TABS
650.0000 mg | ORAL_TABLET | ORAL | Status: DC | PRN
  Administered 2022-11-08: 650 mg via ORAL
  Filled 2022-11-08: qty 2

## 2022-11-08 MED ORDER — SODIUM CHLORIDE 0.9 % WEIGHT BASED INFUSION: 1.0000 mL/kg/h | INTRAVENOUS | Status: DC

## 2022-11-08 MED ORDER — LIDOCAINE HCL (PF) 1 % IJ SOLN
INTRAMUSCULAR | Status: DC | PRN
  Administered 2022-11-08: 20 mL via INTRADERMAL

## 2022-11-08 MED ORDER — ONDANSETRON HCL 4 MG/2ML IJ SOLN: 4.0000 mg | Freq: Four times a day (QID) | INTRAMUSCULAR | Status: DC | PRN

## 2022-11-08 MED ORDER — FENTANYL CITRATE (PF) 100 MCG/2ML IJ SOLN
INTRAMUSCULAR | Status: AC
  Filled 2022-11-08: qty 2

## 2022-11-08 MED ORDER — IODIXANOL 320 MG/ML IV SOLN
INTRAVENOUS | Status: DC | PRN
  Administered 2022-11-08: 170 mL via INTRA_ARTERIAL

## 2022-11-08 MED ORDER — MIDAZOLAM HCL 2 MG/2ML IJ SOLN
INTRAMUSCULAR | Status: DC | PRN
  Administered 2022-11-08 (×2): 1 mg via INTRAVENOUS

## 2022-11-08 MED ORDER — SODIUM CHLORIDE 0.9 % IV SOLN: 250.0000 mL | INTRAVENOUS | Status: DC | PRN

## 2022-11-08 MED ORDER — SODIUM CHLORIDE 0.9% FLUSH: 3.0000 mL | Freq: Two times a day (BID) | INTRAVENOUS | Status: DC

## 2022-11-08 MED ORDER — HEPARIN SODIUM (PORCINE) 1000 UNIT/ML IJ SOLN
INTRAMUSCULAR | Status: DC | PRN
  Administered 2022-11-08: 7000 [IU] via INTRAVENOUS
  Administered 2022-11-08: 2000 [IU] via INTRAVENOUS

## 2022-11-08 MED ORDER — NITROGLYCERIN 1 MG/10 ML FOR IR/CATH LAB
INTRA_ARTERIAL | Status: DC | PRN
  Administered 2022-11-08 (×3): 200 ug via INTRA_ARTERIAL

## 2022-11-08 MED ORDER — MIDAZOLAM HCL 2 MG/2ML IJ SOLN
INTRAMUSCULAR | Status: AC
  Filled 2022-11-08: qty 2

## 2022-11-08 MED ORDER — SODIUM CHLORIDE 0.9% FLUSH: 3.0000 mL | INTRAVENOUS | Status: DC | PRN

## 2022-11-08 MED ORDER — HEPARIN SODIUM (PORCINE) 1000 UNIT/ML IJ SOLN
INTRAMUSCULAR | Status: AC
  Filled 2022-11-08: qty 10

## 2022-11-08 MED ORDER — SODIUM CHLORIDE 0.9 % WEIGHT BASED INFUSION
3.0000 mL/kg/h | INTRAVENOUS | Status: AC
  Administered 2022-11-08: 3 mL/kg/h via INTRAVENOUS

## 2022-11-08 MED ORDER — FENTANYL CITRATE (PF) 100 MCG/2ML IJ SOLN
INTRAMUSCULAR | Status: DC | PRN
  Administered 2022-11-08 (×2): 50 ug via INTRAVENOUS
  Administered 2022-11-08 (×2): 25 ug via INTRAVENOUS

## 2022-11-08 MED ORDER — HEPARIN (PORCINE) IN NACL 1000-0.9 UT/500ML-% IV SOLN
INTRAVENOUS | Status: DC | PRN
  Administered 2022-11-08 (×2): 500 mL

## 2022-11-08 MED ORDER — SODIUM CHLORIDE 0.9 % IV SOLN: INTRAVENOUS | Status: DC

## 2022-11-08 SURGICAL SUPPLY — 12 items
CATH ANGIO 5F PIGTAIL 65CM (CATHETERS) IMPLANT
CATH SYMPLICITY SPYRAL RDN (CATHETERS) IMPLANT
DEVICE CLOSURE MYNXGRIP 6/7F (Vascular Products) IMPLANT
KIT ESSENTIALS PG (KITS) IMPLANT
KIT MICROPUNCTURE NIT STIFF (SHEATH) IMPLANT
SHEATH PINNACLE 6F 10CM (SHEATH) IMPLANT
SHEATH PROBE COVER 6X72 (BAG) IMPLANT
STOPCOCK MORSE 400PSI 3WAY (MISCELLANEOUS) IMPLANT
SYR MEDRAD MARK 7 150ML (SYRINGE) IMPLANT
TAPE SHOOT N SEE (TAPE) IMPLANT
TUBING CIL FLEX 10 FLL-RA (TUBING) IMPLANT
WIRE HITORQ VERSACORE ST 145CM (WIRE) IMPLANT

## 2022-11-08 NOTE — Interval H&P Note (Signed)
History and Physical Interval Note:  11/08/2022 12:56 PM  Shirley Bush  has presented today for surgery, with the diagnosis of hp.  The various methods of treatment have been discussed with the patient and family. After consideration of risks, benefits and other options for treatment, the patient has consented to  Procedure(s): RENAL DENERVATION (N/A) as a surgical intervention.  The patient's history has been reviewed, patient examined, no change in status, stable for surgery.  I have reviewed the patient's chart and labs.  Questions were answered to the patient's satisfaction.     Lorine Bears

## 2022-11-08 NOTE — Progress Notes (Signed)
Pt ambulated without difficulty or bleeding.   Discharged home with daughter who will drive and stay with pt x 24 hrs 

## 2022-11-08 NOTE — Discharge Instructions (Signed)
Femoral Site Care This sheet gives you information about how to care for yourself after your procedure. Your health care provider may also give you more specific instructions. If you have problems or questions, contact your health care provider. What can I expect after the procedure? After the procedure, it is common to have: Bruising that usually fades within 1-2 weeks. Tenderness at the site. Follow these instructions at home: Wound care May remove bandage after 24 hours. Do not take baths, swim, or use a hot tub for 5 days. You may shower 24-48 hours after the procedure. Gently wash the site with plain soap and water. Pat the area dry with a clean towel. Do not rub the site. This may cause bleeding. Do not apply powder or lotion to the site. Keep the site clean and dry. Check your femoral site every day for signs of infection. Check for: Redness, swelling, or pain. Fluid or blood. Warmth. Pus or a bad smell. Activity For the first 2-3 days after your procedure, or as long as directed: Avoid climbing stairs as much as possible. Do not squat. Do not lift, push or pull anything that is heavier than 10 lb for 5 days. Rest as directed. Avoid sitting for a long time without moving. Get up to take short walks every 1-2 hours. Do not drive for 24 hours. General instructions Take over-the-counter and prescription medicines only as told by your health care provider. Keep all follow-up visits as told by your health care provider. This is important. DRINK PLENTY OF FLUIDS FOR THE NEXT 2-3 DAYS. Contact a health care provider if you have: A fever or chills. You have redness, swelling, or pain around your insertion site. Get help right away if: The catheter insertion area swells very fast. You pass out. You suddenly start to sweat or your skin gets clammy. The catheter insertion area is bleeding, and the bleeding does not stop when you hold steady pressure on the area. The area near or  just beyond the catheter insertion site becomes pale, cool, tingly, or numb. These symptoms may represent a serious problem that is an emergency. Do not wait to see if the symptoms will go away. Get medical help right away. Call your local emergency services (911 in the [place] Do not drive yourself to the hospital. Summary After the procedure, it is common to have bruising that usually fades within 1-2 weeks. Check your femoral site every day for signs of infection. Do not lift, push or pull anything that is heavier than 10 lb for 5 days.  This information is not intended to replace advice given to you by your health care provider. Make sure you discuss any questions you have with your health care provider. Document Revised: 07/16/2017 Document Reviewed: 07/16/2017 Elsevier Patient Education  2020 Elsevier Inc.  

## 2022-11-09 ENCOUNTER — Encounter (HOSPITAL_COMMUNITY): Payer: Self-pay | Admitting: Cardiovascular Disease

## 2022-11-10 ENCOUNTER — Ambulatory Visit: Payer: Medicare Other | Admitting: Student

## 2022-11-16 ENCOUNTER — Telehealth: Payer: Self-pay | Admitting: Cardiovascular Disease

## 2022-11-16 NOTE — Telephone Encounter (Signed)
Patient is calling to speak to someone to get clarification on information for procedure she had done recently on 04/24. Requesting return call.

## 2022-11-16 NOTE — Telephone Encounter (Signed)
Spoke with patient, SBP averaging 140's but has had readings in 160's Was under the impression she was to stop Amlodipine after renal denervation, has only been taking the Nebivolol.  Advised from what I see on report no medication changes however would reach out to Dr Kirke Corin   Also wanted to know if she was to follow up with Dr Kirke Corin after procedure  Will forward to Roderick Pee RN and Dr Kirke Corin for review

## 2022-11-17 NOTE — Telephone Encounter (Signed)
She should continue to take amlodipine.  We do not see the full effects of renal denervation for 1 to 2 months after the procedure.  She should follow-up with Dr. Duke Salvia as planned.

## 2022-11-20 ENCOUNTER — Encounter (HOSPITAL_COMMUNITY): Payer: Self-pay

## 2022-11-20 NOTE — Telephone Encounter (Signed)
Advised patient, verbalized understanding  

## 2022-11-28 ENCOUNTER — Encounter (HOSPITAL_BASED_OUTPATIENT_CLINIC_OR_DEPARTMENT_OTHER): Payer: Self-pay

## 2022-12-21 ENCOUNTER — Encounter (HOSPITAL_BASED_OUTPATIENT_CLINIC_OR_DEPARTMENT_OTHER): Payer: Self-pay | Admitting: Cardiovascular Disease

## 2022-12-21 ENCOUNTER — Ambulatory Visit (HOSPITAL_BASED_OUTPATIENT_CLINIC_OR_DEPARTMENT_OTHER): Payer: Medicare Other | Admitting: Cardiovascular Disease

## 2022-12-21 VITALS — BP 147/84 | HR 63 | Ht 63.0 in | Wt 149.8 lb

## 2022-12-21 DIAGNOSIS — I10 Essential (primary) hypertension: Secondary | ICD-10-CM

## 2022-12-21 MED ORDER — ESCITALOPRAM OXALATE 10 MG PO TABS: 10.0000 mg | ORAL_TABLET | Freq: Every day | ORAL | 5 refills | Status: DC

## 2022-12-21 NOTE — Patient Instructions (Addendum)
Medication Instructions:  START LEXAPRO 10 MG DAILY   Labwork: NONE  Testing/Procedures: NONE  Follow-Up: 03/29/2023 11:20 am with Ronn Melena NP   Any Other Special Instructions Will Be Listed Below (If Applicable).   If you need a refill on your cardiac medications before your next appointment, please call your pharmacy.

## 2022-12-21 NOTE — Progress Notes (Signed)
Advanced [facility]pertension [facility] Follow-up:    Date:  12/21/2022   ID:  Shirley Bush, DOB 1956-04-16, MRN [id number]  PCP:  Kerri Perches, MD  Cardiologist:  None  Nephrologist:  Referring MD: Kerri Perches, MD   CC: Hypertension  History of Present Illness:    Shirley Bush is a 67 y.o. female with a hx of hypertension, hyperlipidemia, and anxiety, here for follow-up. She was initially seen 01/23/2022 in the [facility]. She saw her PCP 12/2021 and her blood pressure was 172/94. Her blood pressure has been difficult to manage for years. At that appointment, amlodipine was increased to 10 mg and HCTZ 12.5 mg was added. She was encouraged to exercise and follow the DASH diet. She has struggled with medication intolerance. She previously saw Dr. Eden Emms for chest pain. Her symptoms were thought to be related to uncontrolled hypertension and possibly indigestion. She had a nuclear stress test 02/2021 that was negative for ischemia. She only achieved 4.6 METS on a Bruce protocol. Echo at that time revealed LVEF 60-65% with normal diastolic function.   She confirmed struggling with hypertension since her 40's in the setting of a work injury that could have been life-threatening. Home blood pressures were 150-160/80-90 on average, so spironolactone 25 mg daily was added and she was enrolled in the [employer] [facility]PM system. She also complained of occasional chest pain that woke her up at night. It was recommended to limit her caffeine and sodium intake. Her potassium supplement was discontinued. She was referred to PREP. On 02/23/22 she saw our pharmacist and her BP was 148/84. She reported being under significant stress at home. She was given a sample course of Edarbi to see if she would tolerate an ARB. She discontinued Edarbi after about 2 weeks due to feeling poorly. It also didn't seem to be very effective. At her follow-up 03/23/22 she brought home readings with mostly high 140s to  low 150s over 80s/90s. She was started on 5 mg nebivolol which she was able to tolerate. Her blood pressures decreased to 130s-140s systolic. At her most recent follow-up 04/28/22 she also asked to discontinue spironolactone due to pill burden, which was agreeable. She was advised it may need to be restarted if her BP increased. Nebivolol was increased to 10 mg daily.   Blood pressures remained in the 130s-140s/70s-80s and reported skin dryness on spironolactone. We discussed alternatives but she preferred to continue with her medications. She continued to struggle with stress, anxiety, and depression. She was not interested in any therapy or medicines. Her lipids were quite elevated. Due to known statin intolerance we gave her a sample of Repatha.  She was referred for renal denervation as she was not interested in trying any additional medications.  She underwent renal denervation/24/24  [name] reports that her blood pressure remains high despite the recent procedure. She states that her blood pressure was 125/70 on the day of the procedure but returned to higher levels after she got home. She believes that stress is a significant contributing factor to her elevated blood pressure. The patient also mentions a history of depression and having tried various medications, which have caused adverse reactions such as breakouts. She expresses feeling stuck and is seeking alternative solutions.    [name] reports that her blood pressure readings have ranged from 130/75 to 142/80, depending on her activities and stress levels. She admits to not engaging in regular exercise, citing joint and knee pain as barriers to  physical activity. She attempted to participate in PREP but experienced pain and discomfort so she stopped.  The patient has discussed the possibility of water activities and water physical therapy for joint pain relief and improved mood but is not interested in pursuing these options at the moment. She  is open to trying a new antidepressant and walking for exercise.    Previous antihypertensives: Lisinopril - rash Maxide Amlodipine - swelling Hydralazine HCTZ - rash  Past Medical History:  Diagnosis Date   Allergy Don't remember   Don't remember   Anxiety    Arthritis In 2007   In both of my shoulders and my knees   Depression 2002   Depression    Phreesia 06/28/2020   Depression    Phreesia 07/23/2020   Encounter for annual physical exam 12/03/2015   Fibromyalgia    GERD (gastroesophageal reflux disease) 01/23/2022   Heart murmur Don't remember   I was in 20s   Hypertension 2004   Medial meniscus tear 04/2013   left   Migraines     Past Surgical History:  Procedure Laterality Date   CESAREAN SECTION  1978   CESAREAN SECTION  1980   CESAREAN SECTION  1992   CESAREAN SECTION N/A    Phreesia 06/28/2020   COLONOSCOPY  02/05/2009   COLONOSCOPY N/A 03/06/2014   Procedure: COLONOSCOPY;  Surgeon: West Bali, MD;  [facility];  Service: Endoscopy;  Laterality: N/A;  10:30 AM   COLONOSCOPY WITH PROPOFOL N/A 01/14/2019   Surgeon: West Bali, MD;  one 3 mm tubular adenoma in the mid ascending colon, moderate diverticulosis in entire colon, external and internal hemorrhoids, tortuous left colon. Recommended repeat in 5 years.    FINGER SURGERY Right    long finger   HERNIA REPAIR N/A    Phreesia 06/28/2020   JOINT REPLACEMENT N/A    Phreesia 06/28/2020   KNEE ARTHROSCOPY WITH MEDIAL MENISECTOMY Left 04/24/2013   Procedure: LEFT KNEE ARTHROSCOPY CHONDROPLASTY WITH MEDIAL MENISECTOMY;  Surgeon: Loreta Ave, MD;  Location: [facility];  Service: Orthopedics;  Laterality: Left;   RENAL DENERVATION N/A 11/08/2022   Procedure: RENAL DENERVATION;  Surgeon: Iran Ouch, MD;  Location: [facility];  Service: Cardiovascular;  Laterality: N/A;   SHOULDER ARTHROSCOPY Left 2009   SHOULDER ARTHROSCOPY Right    SHOULDER ARTHROSCOPY  WITH ROTATOR CUFF REPAIR Left 11/25/2004   TOTAL KNEE ARTHROPLASTY Left 11/11/2014   Procedure: LEFT TOTAL KNEE ARTHROPLASTY;  Surgeon: Mckinley Jewel, MD;  Location: [facility] OR;  Service: Orthopedics;  Laterality: Left;   TUBAL LIGATION  1992   UMBILICAL HERNIA REPAIR  2008    Current Medications: Current Meds  Medication Sig   Alpha-D-Galactosidase (BEANO) TABS Take 1 tablet by mouth daily as needed (gas).   amLODipine (NORVASC) 10 MG tablet Take 1 tablet (10 mg total) by mouth daily. (Patient taking differently: Take 10 mg by mouth at bedtime.)   Aspirin-Acetaminophen (GOODYS BACK & BODY PAIN) 500-325 MG PACK Take 1 packet by mouth daily as needed (pain).   aspirin-acetaminophen-caffeine (EXCEDRIN MIGRAINE) 250-250-65 MG tablet Take 2 tablets by mouth every 6 (six) hours as needed for headache.   BLACK ELDERBERRY PO Take 1 capsule by mouth 2 (two) times daily.   chlorpheniramine (CHLOR-TRIMETON) 4 MG tablet Take 4 mg by mouth at bedtime.   clotrimazole-betamethasone (LOTRISONE) cream Apply 1 Application topically 2 (two) times daily. (Patient taking differently: Apply 1 Application topically 2 (two) times daily as  needed (rash).)   diphenhydrAMINE (BENADRYL) 25 MG tablet Take 25 mg by mouth every 6 (six) hours as needed for itching.   DM-APAP-CPM (CORICIDIN HBP PO) Take 2 tablets by mouth at bedtime as needed (congestion).   escitalopram (LEXAPRO) 10 MG tablet Take 1 tablet (10 mg total) by mouth daily.   Evolocumab (REPATHA SURECLICK) 140 MG/ML SOAJ Inject 140 mg into the skin every 14 (fourteen) days.   FIBER ADULT GUMMIES PO Take 1 capsule by mouth daily.   gabapentin (NEURONTIN) 100 MG capsule TAKE 1 TO 2 CAPSULES BY MOUTH AT BEDTIME FOR PAIN   Iron-Vitamin C (VITRON-C PO) Take 1 tablet by mouth daily.   Multiple Vitamin (MULTIVITAMIN WITH MINERALS) TABS tablet Take 1 tablet by mouth daily.   Multiple Vitamins-Minerals (HAIR SKIN AND NAILS FORMULA) TABS Take 2 tablets by mouth daily.    Multiple Vitamins-Minerals (HEAD CARE PROACTIVE HEALTH) TABS Take 1 tablet by mouth daily.   nebivolol (BYSTOLIC) 10 MG tablet Take 1 tablet (10 mg total) by mouth daily. (Patient taking differently: Take 10 mg by mouth at bedtime.)   nystatin-triamcinolone ointment (MYCOLOG) Apply 1 Application topically 2 (two) times daily. (Patient taking differently: Apply 1 Application topically 2 (two) times daily as needed (rash).)   sodium chloride (OCEAN) 0.65 % SOLN nasal spray Place 1 spray into both nostrils as needed for congestion.   UNABLE TO FIND TDAP Vaccine DX: Z23   Current Facility-Administered Medications for the 12/21/22 encounter (Office Visit) with Chilton Si, MD  Medication   sodium chloride flush (NS) 0.9 % injection 3 mL     Allergies:   Lisinopril, Maxzide [hydrochlorothiazide w-triamterene], Beano meltaways [alpha-d-galactosidase], Buspar [buspirone], Crestor [rosuvastatin calcium], Hydrochlorothiazide, Paxil [paroxetine], and Simethicone   Social History   Socioeconomic History   Marital status: Divorced    Spouse name: Not on file   Number of children: 3   Years of education: 12 grade    Highest education level: 12th grade  Occupational History   Occupation: disability   Tobacco Use   Smoking status: Never   Smokeless tobacco: Never  Vaping Use   Vaping Use: Never used  Substance and Sexual Activity   Alcohol use: Not Currently    Comment: occasionally- rarely.    Drug use: No   Sexual activity: Not Currently    Birth control/protection: None, Post-menopausal  Other Topics Concern   Not on file  Social History Narrative   Son is living with her at the moment, patient states that she is isolating in her home and is depressed more lately    Social Determinants of Health   Financial Resource Strain: Low Risk  (08/07/2022)   Overall Financial Resource Strain (CARDIA)    Difficulty of Paying Living Expenses: Not hard at all  Food Insecurity: No Food Insecurity  (08/07/2022)   Hunger Vital Sign    Worried About Running Out of Food in the Last Year: Never true    Ran Out of Food in the Last Year: Never true  Transportation Needs: No Transportation Needs (08/07/2022)   PRAPARE - Administrator, Civil Service (Medical): No    Lack of Transportation (Non-Medical): No  Physical Activity: Inactive (08/07/2022)   Exercise Vital Sign    Days of Exercise per Week: 0 days    Minutes of Exercise per Session: 0 min  Stress: Stress Concern Present (08/07/2022)   [employer] of Occupational Health - Occupational Stress Questionnaire    Feeling of Stress : Very much  Social Connections: Socially Isolated (08/07/2022)   Social Connection and Isolation Panel [NHANES]    Frequency of Communication with Friends and Family: More than three times a week    Frequency of Social Gatherings with Friends and Family: Once a week    Attends Religious Services: Never    Database administrator or Organizations: No    Attends Engineer, structural: Never    Marital Status: Divorced     Family History: The patient's family history includes Alcohol abuse in her father; Bone cancer in her mother; Cancer in her maternal aunt, sister, and sister; Colon cancer in her sister; Heart attack in her maternal grandmother; Hypertension in her father, mother, and sister; Migraines in her sister; Seizures in her father; Thyroid disease in her sister and sister.  ROS:   Please see the history of present illness.    (+) Myalgias (+) Stress All other systems reviewed and are negative.  EKGs/Labs/Other Studies Reviewed:    Echocardiogram  02/16/2021:  1. Left ventricular ejection fraction, by estimation, is 60 to 65%. The  left ventricle has normal function. The left ventricle has no regional  wall motion abnormalities. Left ventricular diastolic parameters were  normal.   2. Right ventricular systolic function is normal. The right ventricular  size is normal.  Tricuspid regurgitation signal is inadequate for assessing  PA pressure.   3. There is a trivial pericardial effusion posterior to the left  ventricle.   4. The mitral valve is grossly normal. Mild mitral valve regurgitation.   5. The aortic valve is tricuspid. Aortic valve regurgitation is trivial.  Aortic valve mean gradient measures 6.0 mmHg. No aortic stenosis.   6. The inferior vena cava is normal in size with greater than 50%  respiratory variability, suggesting right atrial pressure of 3 mmHg.   Comparison(s): No prior Echocardiogram.   Nuclear Stress Myoview  02/16/2021: Patient exercised for 3 minutes achieving a maximum workload of 4.6 METS and MPHR 95%. No chest pain reported, exercise was limited by knee pain. There were no diagnostic ST segment changes to indicate ischemia. No significant arrhythmias. Calculated Duke treadmill score of 3 is intermediate risk, mainly based on exercise time alone. Blood pressure demonstrated a normal response to exercise. No significant myocardial perfusion defects to indicate scar or ischemia. This is a low risk study based on normal perfusion. Nuclear stress EF: 74%.   EKG:  EKG is personally reviewed. 12/21/22: Sinus rhythm.  Rate 63 bpm. 01/23/2022: Sinus rhythm. Rate 100 bpm.  Recent Labs: 01/30/2022: ALT 17; TSH 5.010 10/30/2022: BUN 9; Creatinine, Ser 0.92; Hemoglobin 13.3; Platelets 417; Potassium 5.1; Sodium 139   Recent Lipid Panel    Component Value Date/Time   CHOL 223 (H) 01/30/2022 0827   TRIG 85 01/30/2022 0827   HDL 61 01/30/2022 0827   CHOLHDL 3.7 01/30/2022 0827   CHOLHDL 4.4 10/03/2019 0809   VLDL 20 10/07/2015 0808   LDLCALC 147 (H) 01/30/2022 0827   LDLCALC 148 (H) 10/03/2019 0809    Physical Exam:    VS:  BP (!) 147/84 (BP Location: Left Arm, Patient Position: Sitting, Cuff Size: Normal)   Pulse 63   Ht 5\' 3"  (1.6 m)   Wt 149 lb 12.8 oz (67.9 kg)   BMI 26.54 kg/m  , BMI Body mass index is 26.54  kg/m. GENERAL:  Well-appearing.   HEENT: Pupils equal round and reactive, fundi not visualized, oral mucosa unremarkable NECK:  No jugular venous distention, waveform within normal limits,  carotid upstroke brisk and symmetric, no bruits, no thyromegaly LUNGS:  Clear to auscultation bilaterally HEART:  RRR.  PMI not displaced or sustained,S1 and S2 within normal limits, no S3, no S4, no clicks, no rubs, no murmurs ABD:  Flat, positive bowel sounds normal in frequency in pitch, no bruits, no rebound, no guarding, no midline pulsatile mass, no hepatomegaly, no splenomegaly EXT:  2 plus pulses throughout, no edema, no cyanosis no clubbing SKIN:  No rashes no nodules NEURO:  Cranial nerves II through XII grossly intact, motor grossly intact throughout PSYCH:  Cognitively intact, oriented to person place and time.  Depressed affect   ASSESSMENT/PLAN:    # Uncontrolled Hypertension: She is discouraged that she has not seen greater improvement in her blood pressure since her renal denervation procedure.  Discussed that the fact of renal denervation increases over time, so not to be discouraged yet.  Management is complicated by intolerance to multiple medications.  Blood pressure readings: 144/74 at visit, home readings 130/75 - 142/80.   - Continue current medications: amlodipine and Bystolic.   - Monitor home blood pressure readings and follow up in a few months. -Work on increasing exercise to least 150 minutes weekly.  I suspect that her anxiety and depression are also contributing significantly.  When she went for her procedure she was hopeful that she was going to feel much better and blood pressures in the hospital actually very well-controlled.  Will start by treating her depression prior to titrating her antihypertensive regimen.  # Anxiety and Depression:  - History of trying Paxil and Buspar with limited success and side effects. -Will try Lexapro 10 mg daily. - Continue to follow up  with PCP -Recommended counseling which she declined.  # Lack of exercise and joint pain: - Difficulty with exercise due to joint pain. Previous negative experience with gabapentin. - Encourage low-impact exercises like walking, water activities or water physical therapy.  She declined therapy referral at this time.   Screening for Secondary Hypertension:     01/23/2022    1:52 PM  Causes  Drugs/Herbals Screened     - Comments Limits sodium intake.  Caffeine tablet and coffee.  Some NSAIDS  Sleep Apnea Screened     - Comments snores but no apnea  Thyroid Disease Screened  Hyperaldosteronism N/A  Pheochromocytoma N/A  Cushing's Syndrome N/A  Hyperparathyroidism N/A  Coarctation of the Aorta Screened     - Comments BP symmetric  Compliance Screened    Relevant Labs/Studies:    Latest Ref Rng & Units 10/30/2022    8:15 AM 02/03/2022    9:09 AM 01/30/2022    8:27 AM  Basic Labs  Sodium 134 - 144 mmol/L 139  137  138   Potassium 3.5 - 5.2 mmol/L 5.1  4.9  4.5   Creatinine 0.57 - 1.00 mg/dL 7.82  9.56  2.13        Latest Ref Rng & Units 01/30/2022    8:27 AM 01/24/2021    9:16 AM  Thyroid   TSH 0.450 - 4.500 uIU/mL 5.010  3.930    Disposition:    FU with Donnelle Olmeda C. Duke Salvia, MD, [facility] in 3 months.  Medication Adjustments/Labs and Tests Ordered: Current medicines are reviewed at length with the patient today.  Concerns regarding medicines are outlined above.   Orders Placed This Encounter  Procedures   EKG 12-Lead   Meds ordered this encounter  Medications   escitalopram (LEXAPRO) 10 MG tablet    Sig:  Take 1 tablet (10 mg total) by mouth daily.    Dispense:  30 tablet    Refill:  5     Signed, Chilton Si, MD  12/21/2022 3:52 PM     [facility]

## 2023-01-11 ENCOUNTER — Ambulatory Visit (INDEPENDENT_AMBULATORY_CARE_PROVIDER_SITE_OTHER): Payer: Medicare Other | Admitting: Family Medicine

## 2023-01-11 ENCOUNTER — Encounter: Payer: Self-pay | Admitting: Family Medicine

## 2023-01-11 VITALS — BP 148/80 | HR 77 | Temp 99.0°F | Ht 63.0 in | Wt 149.1 lb

## 2023-01-11 DIAGNOSIS — J209 Acute bronchitis, unspecified: Secondary | ICD-10-CM | POA: Diagnosis not present

## 2023-01-11 DIAGNOSIS — J309 Allergic rhinitis, unspecified: Secondary | ICD-10-CM | POA: Diagnosis not present

## 2023-01-11 DIAGNOSIS — J0100 Acute maxillary sinusitis, unspecified: Secondary | ICD-10-CM | POA: Diagnosis not present

## 2023-01-11 DIAGNOSIS — I10 Essential (primary) hypertension: Secondary | ICD-10-CM | POA: Diagnosis not present

## 2023-01-11 MED ORDER — MONTELUKAST SODIUM 10 MG PO TABS
10.0000 mg | ORAL_TABLET | Freq: Every day | ORAL | 3 refills | Status: DC
Start: 2023-01-11 — End: 2023-10-15

## 2023-01-11 MED ORDER — PENICILLIN V POTASSIUM 500 MG PO TABS: 500.0000 mg | ORAL_TABLET | Freq: Three times a day (TID) | ORAL | 0 refills | Status: DC

## 2023-01-11 MED ORDER — BENZONATATE 100 MG PO CAPS: 100.0000 mg | ORAL_CAPSULE | Freq: Two times a day (BID) | ORAL | 0 refills | Status: DC | PRN

## 2023-01-11 MED ORDER — FLUTICASONE PROPIONATE 50 MCG/ACT NA SUSP: 2.0000 | Freq: Every day | NASAL | 6 refills | Status: DC

## 2023-01-11 NOTE — Patient Instructions (Signed)
Annual exam in office in November , please schedule  You are treated for uncontrolled allergies and sinusitis and bronchitis, tessalon perles, penicillin, montelukast and fluticasone nasal spray are prescribed  It is important that you exercise regularly at least 30 minutes 5 times a week. If you develop chest pain, have severe difficulty breathing, or feel very tired, stop exercising immediately and seek medical attention   Think about what you will eat, plan ahead. Choose " clean, green, fresh or frozen" over canned, processed or packaged foods which are more sugary, salty and fatty. 70 to 75% of food eaten should be vegetables and fruit. Three meals at set times with snacks allowed between meals, but they must be fruit or vegetables. Aim to eat over a 12 hour period , example 7 am to 7 pm, and STOP after  your last meal of the day. Drink water,generally about 64 ounces per day, no other drink is as healthy. Fruit juice is best enjoyed in a healthy way, by EATING the fruit.  Thanks for choosing [facility], we consider it a privelige to serve you.

## 2023-01-12 DIAGNOSIS — J0100 Acute maxillary sinusitis, unspecified: Secondary | ICD-10-CM | POA: Insufficient documentation

## 2023-01-12 NOTE — Progress Notes (Signed)
   Shirley Bush     MRN: [id number]      DOB: February 02, 1956  Chief Complaint  Patient presents with   Sinusitis    Allergies/sore throat/congestion/ears full     HPI Shirley Bush is here with a 2 week h/o head and chest congestion which is worsenign, she c/o green nasal drainage and green sputum and ahs had intermittent chills, has been using  her allergy meds with no improvement. Also for follow up and re-evaluation of chronic medical conditions,specificall hypertension.  Preventive health is updated, specifically  Cancer screening and Immunization.   Questions or concerns regarding consultations or procedures which the PT has had in the interim are  addressed. The PT denies any adverse reactions to current medications since the last visit.    ROS  Denies chest pains, palpitations and leg swelling Denies abdominal pain, nausea, vomiting,diarrhea or constipation.   Denies dysuria, frequency, hesitancy or incontinence. Denies joint pain, swelling and limitation in mobility. Denies headaches, seizures, numbness, or tingling. Denies depression, anxiety or insomnia. Denies skin break down or rash.   PE  BP (!) 148/80   Pulse 77   Temp 99 F (37.2 C) (Oral)   Ht 5\' 3"  (1.6 m)   Wt 149 lb 1.3 oz (67.6 kg)   SpO2 96%   BMI 26.41 kg/m   Patient alert and oriented and in no cardiopulmonary distress.  HEENT: No facial asymmetry, EOMI,     Neck supple .Maxillary sinus tenderness, TM clear, oropharynx not erythema  or exudate, right ant cervical adenitis  Chest: few basilar crackles bilaterally,  no wheezes CVS: S1, S2 no murmurs, no S3.Regular rate.  ABD: Soft non tender.   Ext: No edema  MS: Adequate ROM spine, shoulders, hips and knees.  Skin: Intact, no ulcerations or rash noted.  Psych: Good eye contact, normal affect. Memory intact not anxious or depressed appearing.  CNS: CN 2-12 intact, power,  normal throughout.no focal deficits noted.   Assessment & Plan  Acute  bronchitis Penicillin V Tessalon Perles are prescribed.  She can is encouraged to ensure she has good water intake.  Allergic rhinitis Uncontrolled cough, she is to commit to daily Flonase and Singulair.  She is also encouraged to wear a facemask when out doors  Maxillary sinusitis, acute Penicillin V prescribed.  Essential hypertension DASH diet and commitment to daily physical activity for a minimum of 30 minutes discussed and encouraged, as a part of hypertension management. The importance of attaining a healthy weight is also discussed.     01/11/2023    1:38 PM 01/11/2023    1:07 PM 01/11/2023    1:06 PM 12/21/2022    1:37 PM 11/08/2022    5:55 PM 11/08/2022    5:05 PM 11/08/2022    4:05 PM  BP/Weight  Systolic BP 148 148 148 147 130 130 138  Diastolic BP 80 81 83 84 78 81 77  Wt. (Lbs)   149.08 149.8     BMI   26.41 kg/m2 26.54 kg/m2        Improved but slightly elevated at this visit, she reports blood pressures within range is tolerating her medications well.

## 2023-01-12 NOTE — Assessment & Plan Note (Signed)
Uncontrolled cough, she is to commit to daily Flonase and Singulair.  She is also encouraged to wear a facemask when out doors

## 2023-01-12 NOTE — Assessment & Plan Note (Signed)
DASH diet and commitment to daily physical activity for a minimum of 30 minutes discussed and encouraged, as a part of hypertension management. The importance of attaining a healthy weight is also discussed.     01/11/2023    1:38 PM 01/11/2023    1:07 PM 01/11/2023    1:06 PM 12/21/2022    1:37 PM 11/08/2022    5:55 PM 11/08/2022    5:05 PM 11/08/2022    4:05 PM  BP/Weight  Systolic BP 148 148 148 147 130 130 138  Diastolic BP 80 81 83 84 78 81 77  Wt. (Lbs)   149.08 149.8     BMI   26.41 kg/m2 26.54 kg/m2        Improved but slightly elevated at this visit, she reports blood pressures within range is tolerating her medications well.

## 2023-01-12 NOTE — Assessment & Plan Note (Signed)
Penicillin V prescribed.

## 2023-01-12 NOTE — Assessment & Plan Note (Signed)
Penicillin V Tessalon Perles are prescribed.  She can is encouraged to ensure she has good water intake.

## 2023-01-26 ENCOUNTER — Ambulatory Visit: Payer: Medicare Other | Admitting: Family Medicine

## 2023-03-06 ENCOUNTER — Other Ambulatory Visit (HOSPITAL_COMMUNITY): Payer: Self-pay

## 2023-03-06 ENCOUNTER — Telehealth: Payer: Self-pay | Admitting: Pharmacy Technician

## 2023-03-06 NOTE — Telephone Encounter (Signed)
Pharmacy Patient Advocate Encounter   Received notification from CoverMyMeds that prior authorization for Repatha SureClick 140MG /ML auto-injectors is required/requested.   Insurance verification completed.   The patient is insured through [employer] .   Per test claim: The current 03/06/23 day co-pay is, $45.00.  No PA needed at this time. This test claim was processed through [facility]- copay amounts may vary at other pharmacies due to pharmacy/plan contracts, or as the patient moves through the different stages of their insurance plan.

## 2023-03-12 DIAGNOSIS — H524 Presbyopia: Secondary | ICD-10-CM | POA: Diagnosis not present

## 2023-03-21 ENCOUNTER — Other Ambulatory Visit: Payer: Self-pay | Admitting: Family Medicine

## 2023-03-26 ENCOUNTER — Other Ambulatory Visit: Payer: Self-pay | Admitting: Family Medicine

## 2023-03-27 ENCOUNTER — Other Ambulatory Visit: Payer: Self-pay

## 2023-03-27 ENCOUNTER — Telehealth: Payer: Self-pay | Admitting: Family Medicine

## 2023-03-27 MED ORDER — BUSPIRONE HCL 5 MG PO TABS: 5.0000 mg | ORAL_TABLET | Freq: Two times a day (BID) | ORAL | 2 refills | Status: DC

## 2023-03-27 NOTE — Telephone Encounter (Signed)
Prescription Request  03/27/2023  LOV: 01/11/2023  What is the name of the medication or equipment? busPIRone (BUSPAR) 5 MG tablet [[id number]]  DISCONTINUED    Have you contacted your pharmacy to request a refill? Yes   Which pharmacy would you like this sent to?  [facility] [address] [place] - [address] [place] [address] Phone: [phone] Fax: [phone]     Patient notified that their request is being sent to the clinical staff for review and that they should receive a response within 2 business days.   Please advise at Mobile [phone] (mobile)

## 2023-03-27 NOTE — Telephone Encounter (Signed)
Refills sent

## 2023-03-29 ENCOUNTER — Encounter (HOSPITAL_BASED_OUTPATIENT_CLINIC_OR_DEPARTMENT_OTHER): Payer: Medicare HMO | Admitting: Family

## 2023-04-12 ENCOUNTER — Encounter (HOSPITAL_BASED_OUTPATIENT_CLINIC_OR_DEPARTMENT_OTHER): Payer: Medicare HMO | Admitting: Family

## 2023-05-24 ENCOUNTER — Encounter (HOSPITAL_BASED_OUTPATIENT_CLINIC_OR_DEPARTMENT_OTHER): Payer: Self-pay | Admitting: Family

## 2023-05-24 ENCOUNTER — Ambulatory Visit (HOSPITAL_BASED_OUTPATIENT_CLINIC_OR_DEPARTMENT_OTHER): Payer: Medicare HMO | Admitting: Family

## 2023-05-24 VITALS — BP 132/84 | HR 105 | Ht 63.0 in | Wt 150.3 lb

## 2023-05-24 DIAGNOSIS — E782 Mixed hyperlipidemia: Secondary | ICD-10-CM

## 2023-05-24 DIAGNOSIS — I10 Essential (primary) hypertension: Secondary | ICD-10-CM | POA: Diagnosis not present

## 2023-05-24 MED ORDER — NEBIVOLOL HCL 10 MG PO TABS: 10.0000 mg | ORAL_TABLET | Freq: Every day | ORAL | 3 refills | Status: DC

## 2023-05-24 NOTE — Progress Notes (Signed)
Advanced Hypertension Clinic Assessment:    Date:  05/24/2023   ID:  Shirley Bush, DOB 09/13/1955, MRN [id number]  PCP:  Kerri Perches, MD  Cardiologist:  None  Nephrologist:  Referring MD: Kerri Perches, MD   CC: Hypertension  History of Present Illness:    Shirley Bush is a 67 y.o. female with a hx of hypertension, aortic atherosclerosis, hyperlipidemia, anxiety here to follow-up in the Advanced Hypertension Clinic.   Established with [facility] 01/23/2022.  Diagnosed with hypertension in her 40s.  Noted her blood pressure has been difficult to manage for many years.  PCP had previously increased amlodipine and added hydrochlorothiazide.  Prior cardiac workup for chest pain 02/2021 with low risk nuclear stress test.  Echo LVEF 60 to 65% with normal diastolic function.  At initial HTN clinic visit enrolled in the [employer] RPM system.  She was referred to PREP exercise program.  She did not tolerate Edarbi due to feeling poorly and did not seem to be effective.  Nebivolol later increased to 10 mg daily.  Known intolerance to statin and Repatha was trialed which she tolerated but then it became expensive.  Of note she has allergic reactions to capsules and softgels.  Seen 09/04/2022.  She noted dry skin on spironolactone but was willing to continue.  Due to multiple medication intolerances and BP not at goal she was set up for renal denervation which was performed 11/08/22.  Last seen 12/21/22 with BP readings 130/75-142/80 worsened at times of anxiety. Did not tolerate PREP exercise program due to discomfort. Amlodipine and BYstolic were continued. Her anxiety and depression were felt to be contributory and Lexapro 10mg  initiated. She declined counseling and water physical therapy referral.   Presents today for follow-up independently. Checks BP at home about two hours after she takes her medications, runs 130s-140s/70s-80s. Has been taking amlodipine mostly  in the evenings but is not taking nebivolol. Uncear how long she has been without this, but notes headaches and feeling more achy with it. Discussed more likely due to her fibromyalgia. Started taking Lexapro after last visit but self-discontinued for unclear reason (aches and pains, rash). Again notes that she feels stress contributes greatly to her hypertension. Exercise is limited due to fibromyalgia, hasn't had a huge appetite recently so eating what she wants. Minimal bilateral LE edema which she states is chronic. Reports no shortness of breath nor dyspnea on exertion. Reports no chest pain, pressure, or tightness. No orthopnea, PND. Reports no palpitations.   Previous antihypertensives: Lisinopril - rash Maxide Amlodipine - swelling Hydralazine HCTZ - rash Edarbi-did not feel well, ineffective Spironolactone - dry skin  Past Medical History:  Diagnosis Date   Allergy Don't remember   Don't remember   Anxiety    Arthritis In 2007   In both of my shoulders and my knees   Depression 2002   Depression    Phreesia 06/28/2020   Depression    Phreesia 07/23/2020   Encounter for annual physical exam 12/03/2015   Fibromyalgia    GERD (gastroesophageal reflux disease) 01/23/2022   Heart murmur Don't remember   I was in 20s   Hypertension 2004   Medial meniscus tear 04/2013   left   Migraines     Past Surgical History:  Procedure Laterality Date   CESAREAN SECTION  1978   CESAREAN SECTION  1980   CESAREAN SECTION  1992   CESAREAN SECTION N/A    Phreesia 06/28/2020   COLONOSCOPY  02/05/2009   COLONOSCOPY N/A 03/06/2014   Procedure: COLONOSCOPY;  Surgeon: West Bali, MD;  [facility];  Service: Endoscopy;  Laterality: N/A;  10:30 AM   COLONOSCOPY WITH PROPOFOL N/A 01/14/2019   Surgeon: West Bali, MD;  one 3 mm tubular adenoma in the mid ascending colon, moderate diverticulosis in entire colon, external and internal hemorrhoids, tortuous left colon.  Recommended repeat in 5 years.    FINGER SURGERY Right    long finger   HERNIA REPAIR N/A    Phreesia 06/28/2020   JOINT REPLACEMENT N/A    Phreesia 06/28/2020   KNEE ARTHROSCOPY WITH MEDIAL MENISECTOMY Left 04/24/2013   Procedure: LEFT KNEE ARTHROSCOPY CHONDROPLASTY WITH MEDIAL MENISECTOMY;  Surgeon: Loreta Ave, MD;  Location: [facility];  Service: Orthopedics;  Laterality: Left;   RENAL DENERVATION N/A 11/08/2022   Procedure: RENAL DENERVATION;  Surgeon: Iran Ouch, MD;  Location: [facility];  Service: Cardiovascular;  Laterality: N/A;   SHOULDER ARTHROSCOPY Left 2009   SHOULDER ARTHROSCOPY Right    SHOULDER ARTHROSCOPY WITH ROTATOR CUFF REPAIR Left 11/25/2004   TOTAL KNEE ARTHROPLASTY Left 11/11/2014   Procedure: LEFT TOTAL KNEE ARTHROPLASTY;  Surgeon: Mckinley Jewel, MD;  Location: [facility];  Service: Orthopedics;  Laterality: Left;   TUBAL LIGATION  1992   UMBILICAL HERNIA REPAIR  2008    Current Medications: Current Meds  Medication Sig   Alpha-D-Galactosidase (BEANO) TABS Take 1 tablet by mouth daily as needed (gas).   amLODipine (NORVASC) 10 MG tablet Take 1 tablet (10 mg total) by mouth daily. (Patient taking differently: Take 10 mg by mouth at bedtime.)   Aspirin-Acetaminophen (GOODYS BACK & BODY PAIN) 500-325 MG PACK Take 1 packet by mouth daily as needed (pain).   aspirin-acetaminophen-caffeine (EXCEDRIN MIGRAINE) 250-250-65 MG tablet Take 2 tablets by mouth every 6 (six) hours as needed for headache.   benzonatate (TESSALON) 100 MG capsule Take 1 capsule (100 mg total) by mouth 2 (two) times daily as needed for cough.   BLACK ELDERBERRY PO Take 1 capsule by mouth 2 (two) times daily.   busPIRone (BUSPAR) 5 MG tablet Take 1 tablet (5 mg total) by mouth 2 (two) times daily.   diphenhydrAMINE (BENADRYL) 25 MG tablet Take 25 mg by mouth every 6 (six) hours as needed for itching.   DM-APAP-CPM (CORICIDIN HBP PO) Take 2 tablets by mouth at bedtime  as needed (congestion).   FIBER ADULT GUMMIES PO Take 1 capsule by mouth daily.   fluticasone (FLONASE) 50 MCG/ACT nasal spray Place 2 sprays into both nostrils daily. (Patient taking differently: Place 2 sprays into both nostrils as needed for allergies.)   gabapentin (NEURONTIN) 100 MG capsule TAKE 1 TO 2 CAPSULES BY MOUTH AT BEDTIME FOR PAIN   Iron-Vitamin C (VITRON-C PO) Take 1 tablet by mouth daily.   Multiple Vitamin (MULTIVITAMIN WITH MINERALS) TABS tablet Take 1 tablet by mouth daily.   Multiple Vitamins-Minerals (HAIR SKIN AND NAILS FORMULA) TABS Take 2 tablets by mouth daily.   [DISCONTINUED] escitalopram (LEXAPRO) 10 MG tablet Take 1 tablet (10 mg total) by mouth daily.   [DISCONTINUED] nebivolol (BYSTOLIC) 10 MG tablet Take 1 tablet (10 mg total) by mouth daily. (Patient taking differently: Take 10 mg by mouth at bedtime.)   Current Facility-Administered Medications for the 05/24/23 encounter (Office Visit) with Alver Sorrow, NP  Medication   sodium chloride flush (NS) 0.9 % injection 3 mL     Allergies:   Lisinopril,  Maxzide [hydrochlorothiazide w-triamterene], Beano meltaways [alpha-d-galactosidase], Buspar [buspirone], Crestor [rosuvastatin calcium], Hydrochlorothiazide, Paxil [paroxetine], and Simethicone   Social History   Socioeconomic History   Marital status: Divorced    Spouse name: Not on file   Number of children: 3   Years of education: 12 grade    Highest education level: 12th grade  Occupational History   Occupation: disability   Tobacco Use   Smoking status: Never   Smokeless tobacco: Never  Vaping Use   Vaping status: Never Used  Substance and Sexual Activity   Alcohol use: Not Currently    Comment: occasionally- rarely.    Drug use: No   Sexual activity: Not Currently    Birth control/protection: None, Post-menopausal  Other Topics Concern   Not on file  Social History Narrative   Son is living with her at the moment, patient states that she  is isolating in her home and is depressed more lately    Social Determinants of Health   Financial Resource Strain: Low Risk  (01/10/2023)   Overall Financial Resource Strain (CARDIA)    Difficulty of Paying Living Expenses: Not very hard  Food Insecurity: Patient Declined (01/10/2023)   Hunger Vital Sign    Worried About Running Out of Food in the Last Year: Patient declined    Ran Out of Food in the Last Year: Patient declined  Transportation Needs: No Transportation Needs (01/10/2023)   PRAPARE - Administrator, Civil Service (Medical): No    Lack of Transportation (Non-Medical): No  Physical Activity: Inactive (01/10/2023)   Exercise Vital Sign    Days of Exercise per Week: 0 days    Minutes of Exercise per Session: 0 min  Stress: Stress Concern Present (01/10/2023)   [employer] of Occupational Health - Occupational Stress Questionnaire    Feeling of Stress : Very much  Social Connections: Socially Isolated (01/10/2023)   Social Connection and Isolation Panel [NHANES]    Frequency of Communication with Friends and Family: Once a week    Frequency of Social Gatherings with Friends and Family: Never    Attends Religious Services: Never    Database administrator or Organizations: No    Attends Engineer, structural: Never    Marital Status: Divorced     Family History: The patient's family history includes Alcohol abuse in her father; Bone cancer in her mother; Cancer in her maternal aunt, sister, and sister; Colon cancer in her sister; Heart attack in her maternal grandmother; Hypertension in her father, mother, and sister; Migraines in her sister; Seizures in her father; Thyroid disease in her sister and sister.  ROS:   Please see the history of present illness.     All other systems reviewed and are negative.  EKGs/Labs/Other Studies Reviewed:         Cardiac Studies & Procedures     STRESS TESTS  NM MYOCAR MULTI W/SPECT W  02/16/2021  Narrative  Patient exercised for 3 minutes achieving a maximum workload of 4.6 METS and MPHR 95%. No chest pain reported, exercise was limited by knee pain. There were no diagnostic ST segment changes to indicate ischemia. No significant arrhythmias. Calculated Duke treadmill score of 3 is intermediate risk, mainly based on exercise time alone.  Blood pressure demonstrated a normal response to exercise.  No significant myocardial perfusion defects to indicate scar or ischemia.  This is a low risk study based on normal perfusion.  Nuclear stress EF: 74%.   ECHOCARDIOGRAM  ECHOCARDIOGRAM COMPLETE 02/16/2021  Narrative ECHOCARDIOGRAM REPORT    Patient Name:   Shirley Bush Beaudin Date of Exam: 02/16/2021 Medical Rec #:  [id number]        Height:       63.0 in Accession #:    [id number]       Weight:       152.8 lb Date of Birth:  10-18-1955        BSA:          1.725 m Patient Age:    64 years         BP:           168/83 mmHg Patient Gender: F                HR:           85 bpm. Ex[facility]  Procedure: 2D Echo, Cardiac Doppler and Color Doppler  Indications:    Murmur  History:        Patient has no prior history of Echocardiogram examinations. Arrythmias:LBBB, Signs/Symptoms:Chest Pain; Risk Factors:Hypertension and Dyslipidemia.  Sonographer:    Mikki Harbor Referring Phys: [id number] Wendall Stade  IMPRESSIONS   1. Left ventricular ejection fraction, by estimation, is 60 to 65%. The left ventricle has normal function. The left ventricle has no regional wall motion abnormalities. Left ventricular diastolic parameters were normal. 2. Right ventricular systolic function is normal. The right ventricular size is normal. Tricuspid regurgitation signal is inadequate for assessing PA pressure. 3. There is a trivial pericardial effusion posterior to the left ventricle. 4. The mitral valve is grossly normal. Mild mitral valve regurgitation. 5. The aortic valve  is tricuspid. Aortic valve regurgitation is trivial. Aortic valve mean gradient measures 6.0 mmHg. No aortic stenosis. 6. The inferior vena cava is normal in size with greater than 50% respiratory variability, suggesting right atrial pressure of 3 mmHg.  Comparison(s): No prior Echocardiogram.  FINDINGS Left Ventricle: Left ventricular ejection fraction, by estimation, is 60 to 65%. The left ventricle has normal function. The left ventricle has no regional wall motion abnormalities. The left ventricular internal cavity size was normal in size. There is no left ventricular hypertrophy. Left ventricular diastolic parameters were normal.  Right Ventricle: The right ventricular size is normal. No increase in right ventricular wall thickness. Right ventricular systolic function is normal. Tricuspid regurgitation signal is inadequate for assessing PA pressure.  Left Atrium: Left atrial size was normal in size.  Right Atrium: Right atrial size was normal in size.  Pericardium: Trivial pericardial effusion is present. The pericardial effusion is posterior to the left ventricle. Presence of pericardial fat pad.  Mitral Valve: The mitral valve is grossly normal. Mild mitral valve regurgitation. MV peak gradient, 5.2 mmHg. The mean mitral valve gradient is 2.0 mmHg.  Tricuspid Valve: The tricuspid valve is grossly normal. Tricuspid valve regurgitation is trivial.  Aortic Valve: The aortic valve is tricuspid. There is mild aortic valve annular calcification. Aortic valve regurgitation is trivial. Aortic valve mean gradient measures 6.0 mmHg. Aortic valve peak gradient measures 11.3 mmHg. Aortic valve area, by VTI measures 1.64 cm.  Pulmonic Valve: The pulmonic valve was grossly normal. Pulmonic valve regurgitation is trivial.  Aorta: The aortic root is normal in size and structure.  Venous: The inferior vena cava is normal in size with greater than 50% respiratory variability, suggesting right  atrial pressure of 3 mmHg.  IAS/Shunts: No atrial level shunt detected by color flow Doppler.  LEFT VENTRICLE PLAX 2D LVIDd:         3.82 cm  Diastology LVIDs:         2.57 cm  LV e' medial:    8.32 cm/s LV PW:         0.82 cm  LV E/e' medial:  8.3 LV IVS:        0.94 cm  LV e' lateral:   8.85 cm/s LVOT diam:     1.90 cm  LV E/e' lateral: 7.8 LV SV:         58 LV SV Index:   34 LVOT Area:     2.84 cm   RIGHT VENTRICLE RV Basal diam:  2.76 cm RV Mid diam:    2.26 cm RV S prime:     11.90 cm/s TAPSE (M-mode): 2.5 cm  LEFT ATRIUM             Index       RIGHT ATRIUM           Index LA diam:        2.90 cm 1.68 cm/m  RA Area:     11.60 cm LA Vol (A2C):   39.0 ml 22.61 ml/m RA Volume:   24.20 ml  14.03 ml/m LA Vol (A4C):   28.5 ml 16.53 ml/m LA Biplane Vol: 33.9 ml 19.66 ml/m AORTIC VALVE AV Area (Vmax):    1.52 cm AV Area (Vmean):   1.62 cm AV Area (VTI):     1.64 cm AV Vmax:           168.00 cm/s AV Vmean:          111.000 cm/s AV VTI:            0.355 m AV Peak Grad:      11.3 mmHg AV Mean Grad:      6.0 mmHg LVOT Vmax:         90.30 cm/s LVOT Vmean:        63.400 cm/s LVOT VTI:          0.205 m LVOT/AV VTI ratio: 0.58  AORTA Ao Root diam: 2.60 cm Ao Asc diam:  2.50 cm  MITRAL VALVE MV Area (PHT): 4.19 cm     SHUNTS MV Area VTI:   2.18 cm     Systemic VTI:  0.20 m MV Peak grad:  5.2 mmHg     Systemic Diam: 1.90 cm MV Mean grad:  2.0 mmHg MV Vmax:       1.14 m/s MV Vmean:      67.8 cm/s MV Decel Time: 181 msec MV E velocity: 69.20 cm/s MV A velocity: 100.00 cm/s MV E/A ratio:  0.69  Nona Dell MD Electronically signed by Nona Dell MD Signature Date/Time: 02/16/2021/4:58:40 PM    Final              Recent Labs: 10/30/2022: BUN 9; Creatinine, Ser 0.92; Hemoglobin 13.3; Platelets 417; Potassium 5.1; Sodium 139   Recent Lipid Panel    Component Value Date/Time   CHOL 223 (H) 01/30/2022 0827   TRIG 85 01/30/2022 0827   HDL 61  01/30/2022 0827   CHOLHDL 3.7 01/30/2022 0827   CHOLHDL 4.4 10/03/2019 0809   VLDL 20 10/07/2015 0808   LDLCALC 147 (H) 01/30/2022 0827   LDLCALC 148 (H) 10/03/2019 0809    Physical Exam:   VS:  BP 132/84   Pulse (!) 105   Ht 5\' 3"  (1.6 m)   Wt 150  lb 4.8 oz (68.2 kg)   SpO2 96%   BMI 26.62 kg/m  , BMI Body mass index is 26.62 kg/m. GENERAL:  Well appearing HEENT: Pupils equal round and reactive, fundi not visualized, oral mucosa unremarkable NECK:  No jugular venous distention, waveform within normal limits, carotid upstroke brisk and symmetric, no bruits, no thyromegaly LYMPHATICS:  No cervical adenopathy LUNGS:  Clear to auscultation bilaterally HEART:  RRR.  PMI not displaced or sustained,S1 and S2 within normal limits, no S3, no S4, no clicks, no rubs, no murmurs ABD:  Flat, positive bowel sounds normal in frequency in pitch, no bruits, no rebound, no guarding, no midline pulsatile mass, no hepatomegaly, no splenomegaly EXT:  2 plus pulses throughout, no edema, no cyanosis no clubbing SKIN:  No rashes no nodules NEURO:  Cranial nerves II through XII grossly intact, motor grossly intact throughout PSYCH:  Cognitively intact, oriented to person place and time   ASSESSMENT/PLAN:    HTN -Struggles with intolerance to many medications.  Renal denervation completed.  BP not at goal less than 130/80 in the setting of her self discontinuing her nebivolol.  Also mildly tachycardic today.  Discussed muscle discomfort and headache would be atypical side effects of nebivolol.  Continue amlodipine and restart nebivolol. Patient agreeable. Educated on stress management strategies. Discussed to monitor BP at home at least 2 hours after medications and sitting for 5-10 minutes.  Aortic atherosclerosis/HLD - Stable with no anginal symptoms. No indication for ischemic evaluation.   Intolerant to statin. Repatha cost-prohibitive.  Has since declined [employer] as she is concerned regarding  side effects.  Recommend aiming for 150 minutes of moderate intensity activity per week and following a heart healthy diet.    Anxiety and depression-she is no longer taking Lexapro and does not wish to trial again at this time.  Encouraged her to reach back out to our team or PCP if this changes.  Screening for Secondary Hypertension:     01/23/2022    1:52 PM  Causes  Drugs/Herbals Screened     - Comments Limits sodium intake.  Caffeine tablet and coffee.  Some NSAIDS  Sleep Apnea Screened     - Comments snores but no apnea  Thyroid Disease Screened  Hyperaldosteronism N/A  Pheochromocytoma N/A  Cushing's Syndrome N/A  Hyperparathyroidism N/A  Coarctation of the Aorta Screened     - Comments BP symmetric  Compliance Screened    Relevant Labs/Studies:    Latest Ref Rng & Units 10/30/2022    8:15 AM 02/03/2022    9:09 AM 01/30/2022    8:27 AM  Basic Labs  Sodium 134 - 144 mmol/L 139  137  138   Potassium 3.5 - 5.2 mmol/L 5.1  4.9  4.5   Creatinine 0.57 - 1.00 mg/dL 9.60  4.54  0.98        Latest Ref Rng & Units 01/30/2022    8:27 AM 01/24/2021    9:16 AM  Thyroid   TSH 0.450 - 4.500 uIU/mL 5.010  3.930                  Disposition:    FU with MD/PharmD/APP in 3 months.   Medication Adjustments/Labs and Tests Ordered: Current medicines are reviewed at length with the patient today.  Concerns regarding medicines are outlined above.  No orders of the defined types were placed in this encounter.  Meds ordered this encounter  Medications   nebivolol (BYSTOLIC) 10 MG tablet  Sig: Take 1 tablet (10 mg total) by mouth at bedtime.    Dispense:  90 tablet    Refill:  3     Signed, Alver Sorrow, NP  05/24/2023 2:28 PM    [facility]

## 2023-05-24 NOTE — Patient Instructions (Signed)
Medication Instructions:  Your physician has recommended you make the following change in your medication:  Lexapro has been discontinued and removed from your medication list. Bystolic has been refilled and sent to your preferred pharmacy.  *If you need a refill on your cardiac medications before your next appointment, please call your pharmacy*   Follow-Up: At [facility], you and your health needs are our priority.  As part of our continuing mission to provide you with exceptional heart care, we have created designated Provider Care Teams.  These Care Teams include your primary Cardiologist (physician) and Advanced Practice Providers (APPs -  Physician Assistants and Nurse Practitioners) who all work together to provide you with the care you need, when you need it.  We recommend signing up for the patient portal called "MyChart".  Sign up information is provided on this After Visit Summary.  MyChart is used to connect with patients for Virtual Visits (Telemedicine).  Patients are able to view lab/test results, encounter notes, upcoming appointments, etc.  Non-urgent messages can be sent to your provider as well.   To learn more about what you can do with MyChart, go to [email or web address].    Your next appointment:   3 month(s) ([facility] CLINIC)  Provider:   Gillian Shields, NP

## 2023-05-30 ENCOUNTER — Other Ambulatory Visit (HOSPITAL_COMMUNITY)
Admission: RE | Admit: 2023-05-30 | Discharge: 2023-05-30 | Disposition: A | Discharge status: Home / Self Care | Payer: Medicare HMO | Source: Ambulatory Visit | Attending: Family Medicine | Admitting: Family Medicine

## 2023-05-30 ENCOUNTER — Ambulatory Visit: Payer: Medicare HMO | Admitting: Family Medicine

## 2023-05-30 ENCOUNTER — Encounter: Payer: Self-pay | Admitting: Family Medicine

## 2023-05-30 VITALS — BP 128/80 | HR 74 | Ht 63.0 in | Wt 149.1 lb

## 2023-05-30 DIAGNOSIS — F411 Generalized anxiety disorder: Secondary | ICD-10-CM | POA: Diagnosis not present

## 2023-05-30 DIAGNOSIS — F332 Major depressive disorder, recurrent severe without psychotic features: Secondary | ICD-10-CM

## 2023-05-30 DIAGNOSIS — Z1151 Encounter for screening for human papillomavirus (HPV): Secondary | ICD-10-CM | POA: Diagnosis not present

## 2023-05-30 DIAGNOSIS — Z23 Encounter for immunization: Secondary | ICD-10-CM | POA: Diagnosis not present

## 2023-05-30 DIAGNOSIS — R7989 Other specified abnormal findings of blood chemistry: Secondary | ICD-10-CM

## 2023-05-30 DIAGNOSIS — E559 Vitamin D deficiency, unspecified: Secondary | ICD-10-CM

## 2023-05-30 DIAGNOSIS — Z1231 Encounter for screening mammogram for malignant neoplasm of breast: Secondary | ICD-10-CM | POA: Diagnosis not present

## 2023-05-30 DIAGNOSIS — Z0001 Encounter for general adult medical examination with abnormal findings: Secondary | ICD-10-CM | POA: Diagnosis not present

## 2023-05-30 DIAGNOSIS — R7301 Impaired fasting glucose: Secondary | ICD-10-CM

## 2023-05-30 DIAGNOSIS — J3089 Other allergic rhinitis: Secondary | ICD-10-CM | POA: Diagnosis not present

## 2023-05-30 DIAGNOSIS — E785 Hyperlipidemia, unspecified: Secondary | ICD-10-CM

## 2023-05-30 DIAGNOSIS — I10 Essential (primary) hypertension: Secondary | ICD-10-CM

## 2023-05-30 DIAGNOSIS — Z780 Asymptomatic menopausal state: Secondary | ICD-10-CM

## 2023-05-30 DIAGNOSIS — Z124 Encounter for screening for malignant neoplasm of cervix: Secondary | ICD-10-CM | POA: Diagnosis not present

## 2023-05-30 DIAGNOSIS — Z01419 Encounter for gynecological examination (general) (routine) without abnormal findings: Secondary | ICD-10-CM | POA: Diagnosis not present

## 2023-05-30 MED ORDER — ESCITALOPRAM OXALATE 10 MG PO TABS: 10.0000 mg | ORAL_TABLET | Freq: Every day | ORAL | 2 refills | Status: DC

## 2023-05-30 MED ORDER — FLUTICASONE PROPIONATE 50 MCG/ACT NA SUSP: 2.0000 | Freq: Every day | NASAL | 6 refills | Status: AC

## 2023-05-30 MED ORDER — BUSPIRONE HCL 5 MG PO TABS
ORAL_TABLET | ORAL | 3 refills | Status: DC
Start: 2023-05-30 — End: 2023-06-19

## 2023-05-30 NOTE — Progress Notes (Signed)
    Shirley Bush     MRN: [id number]      DOB: Aug 23, 1955  Chief Complaint  Patient presents with   Annual Exam    CPE discuss buspirone needs pneumonia vaccine    HPI: Patient is in for annual physical exam. Severe depression and anxiety are addressed, nopw agreeing to return to mental health for medical and psychological support, not suicidal or homicidal. Recent labs,  are reviewed. Immunization is reviewed , and  updated  Patient and/or legal guardian verbally consented to [facility] services about presenting concerns and psychiatric consultation as appropriate.  The services will be billed as appropriate for the patient C/o increased nasal and sinus congestio in past 4 weeks    PE: BP 128/80   Pulse 74   Ht 5\' 3"  (1.6 m)   Wt 149 lb 1.3 oz (67.6 kg)   SpO2 96%   BMI 26.41 kg/m   Pleasant  female, alert and oriented x 3, in no cardio-pulmonary distress. Afebrile. HEENT No facial trauma or asymetry. Sinuses non tender. Positive nasal congestion, TM clear  Extra occullar muscles intact.. External ears normal, . Neck: supple, no adenopathy,JVD or thyromegaly.No bruits.  Chest: Clear to ascultation bilaterally.No crackles or wheezes. Non tender to palpation  Breast: Not examined, asymptomatic  Cardiovascular system; Heart sounds normal,  S1 and  S2 ,no S3.  No murmur, or thrill. Apical beat not displaced Peripheral pulses normal.  Abdomen: Soft, non tender, no organomegaly or masses. No bruits. Bowel sounds normal. No guarding, tenderness or rebound.   GU: External genitalia normal female genitalia , normal female distribution of hair. No lesions. Urethral meatus normal in size, no  Prolapse, no lesions visibly  Present. Bladder non tender. Vagina pink and moist , with no visible lesions , discharge present . Adequate pelvic support no  cystocele or rectocele noted Cervix pink and appears healthy, no lesions or ulcerations  noted, no discharge noted from os Uterus normal size, no adnexal masses, no cervical motion or adnexal tenderness.   Musculoskeletal exam: Full ROM of spine, hips , shoulders and knees. No deformity ,swelling or crepitus noted. No muscle wasting or atrophy.   Neurologic: Cranial nerves 2 to 12 intact. Power, tone ,sensation and reflexes normal throughout. No disturbance in gait. No tremor.  Skin: Intact, no ulceration, erythema , scaling or rash noted. Pigmentation normal throughout  Psych; Severely depressed  mood and  nomal affect. Judgement and concentration normal   Assessment & Plan:  Encounter for Medicare annual examination with abnormal findings Annual exam as documented. Counseling done  re healthy lifestyle involving commitment to 150 minutes exercise per week, heart healthy diet, and attaining healthy weight.The importance of adequate sleep also discussed. Regular seat belt use and home safety, is also discussed. Changes in health habits are decided on by the patient with goals and time frames  set for achieving them. Immunization and cancer screening needs are specifically addressed at this visit.   GAD (generalized anxiety disorder) Uncontrolled , resume buspar and refer to therapy and psychiatry  Depression, major, severe recurrence (HCC) Resume lexapro, refer Psych, she agrees, reported intolerance to multiple meds in the past  Encounter for immunization After obtaining informed consent, the pneumonia 20 vaccine is  administered , with no adverse effect noted at the time of administration.   Allergic rhinosinusitis Increased and uncontrolled symptoms, start daily flonase, script sent

## 2023-05-30 NOTE — Patient Instructions (Addendum)
F/Y in 8 to 10  weeks, call if you need me sooner  Fasting lipid, cmp and EGFr, hBA1C and vit D and TSH this week  Pneumonia 20 today  Pap sent today   Please schedule mammogram and dexa at checkout  You are being  referred to Psychiatry and therapist for mental health, commit to daily lexapro and buspar please  BP is excellent  Flonase is prescribed for allergies  Try to soften stool , like eating greens so that you do not have discomfort and back pain  Thanks for choosing [facility], we consider it a privelige to serve you.

## 2023-06-01 DIAGNOSIS — E785 Hyperlipidemia, unspecified: Secondary | ICD-10-CM | POA: Diagnosis not present

## 2023-06-01 DIAGNOSIS — R7301 Impaired fasting glucose: Secondary | ICD-10-CM | POA: Diagnosis not present

## 2023-06-01 DIAGNOSIS — R7989 Other specified abnormal findings of blood chemistry: Secondary | ICD-10-CM | POA: Diagnosis not present

## 2023-06-01 DIAGNOSIS — I10 Essential (primary) hypertension: Secondary | ICD-10-CM | POA: Diagnosis not present

## 2023-06-01 DIAGNOSIS — E559 Vitamin D deficiency, unspecified: Secondary | ICD-10-CM | POA: Diagnosis not present

## 2023-06-01 LAB — CYTOLOGY - PAP
Comment: NEGATIVE
Diagnosis: NEGATIVE
Diagnosis: REACTIVE
High risk HPV: NEGATIVE

## 2023-06-02 LAB — CMP14+EGFR
ALT: 18 [IU]/L (ref 0–32)
AST: 26 [IU]/L (ref 0–40)
Albumin: 4.5 g/dL (ref 3.9–4.9)
Alkaline Phosphatase: 80 [IU]/L (ref 44–121)
BUN/Creatinine Ratio: 11 — ABNORMAL LOW (ref 12–28)
BUN: 10 mg/dL (ref 8–27)
Bilirubin Total: 0.4 mg/dL (ref 0.0–1.2)
CO2: 25 mmol/L (ref 20–29)
Calcium: 9.9 mg/dL (ref 8.7–10.3)
Chloride: 101 mmol/L (ref 96–106)
Creatinine, Ser: 0.92 mg/dL (ref 0.57–1.00)
Globulin, Total: 2.6 g/dL (ref 1.5–4.5)
Glucose: 103 mg/dL — ABNORMAL HIGH (ref 70–99)
Potassium: 4.6 mmol/L (ref 3.5–5.2)
Sodium: 140 mmol/L (ref 134–144)
Total Protein: 7.1 g/dL (ref 6.0–8.5)
eGFR: 69 mL/min/{1.73_m2} (ref >59)

## 2023-06-02 LAB — LIPID PANEL
Chol/HDL Ratio: 4.3 ratio (ref 0.0–4.4)
Cholesterol, Total: 232 mg/dL — ABNORMAL HIGH (ref 100–199)
HDL: 54 mg/dL (ref >39)
LDL Chol Calc (NIH): 153 mg/dL — ABNORMAL HIGH (ref 0–99)
Triglycerides: 142 mg/dL (ref 0–149)
VLDL Cholesterol Cal: 25 mg/dL (ref 5–40)

## 2023-06-02 LAB — VITAMIN D 25 HYDROXY (VIT D DEFICIENCY, FRACTURES): Vit D, 25-Hydroxy: 72.1 ng/mL (ref 30.0–100.0)

## 2023-06-02 LAB — TSH: TSH: 3.6 u[IU]/mL (ref 0.450–4.500)

## 2023-06-02 LAB — HEMOGLOBIN A1C
Est. average glucose Bld gHb Est-mCnc: 123 mg/dL
Hgb A1c MFr Bld: 5.9 % — ABNORMAL HIGH (ref 4.8–5.6)

## 2023-06-04 DIAGNOSIS — Z23 Encounter for immunization: Secondary | ICD-10-CM | POA: Insufficient documentation

## 2023-06-04 DIAGNOSIS — J309 Allergic rhinitis, unspecified: Secondary | ICD-10-CM | POA: Insufficient documentation

## 2023-06-04 DIAGNOSIS — Z124 Encounter for screening for malignant neoplasm of cervix: Secondary | ICD-10-CM | POA: Insufficient documentation

## 2023-06-04 DIAGNOSIS — Z0001 Encounter for general adult medical examination with abnormal findings: Secondary | ICD-10-CM | POA: Insufficient documentation

## 2023-06-04 NOTE — Assessment & Plan Note (Signed)

## 2023-06-04 NOTE — Assessment & Plan Note (Signed)
Increased and uncontrolled symptoms, start daily flonase, script sent

## 2023-06-04 NOTE — Assessment & Plan Note (Signed)
After obtaining informed consent, the pneumonia 20 vaccine is  administered , with no adverse effect noted at the time of administration.

## 2023-06-04 NOTE — Assessment & Plan Note (Signed)
Uncontrolled , resume buspar and refer to therapy and psychiatry

## 2023-06-04 NOTE — Assessment & Plan Note (Signed)
Resume lexapro, refer Psych, she agrees, reported intolerance to multiple meds in the past

## 2023-06-19 ENCOUNTER — Other Ambulatory Visit: Payer: Self-pay | Admitting: Family Medicine

## 2023-07-22 ENCOUNTER — Other Ambulatory Visit: Payer: Self-pay | Admitting: Family Medicine

## 2023-07-26 ENCOUNTER — Encounter (HOSPITAL_COMMUNITY): Payer: Self-pay

## 2023-07-26 ENCOUNTER — Ambulatory Visit (HOSPITAL_COMMUNITY)
Admission: RE | Admit: 2023-07-26 | Discharge: 2023-07-26 | Disposition: A | Discharge status: Home / Self Care | Payer: Medicare Other | Source: Ambulatory Visit | Attending: Family Medicine | Admitting: Family Medicine

## 2023-07-26 DIAGNOSIS — Z1231 Encounter for screening mammogram for malignant neoplasm of breast: Secondary | ICD-10-CM | POA: Insufficient documentation

## 2023-07-26 DIAGNOSIS — Z780 Asymptomatic menopausal state: Secondary | ICD-10-CM | POA: Insufficient documentation

## 2023-07-26 DIAGNOSIS — M8589 Other specified disorders of bone density and structure, multiple sites: Secondary | ICD-10-CM | POA: Diagnosis not present

## 2023-08-03 ENCOUNTER — Ambulatory Visit (INDEPENDENT_AMBULATORY_CARE_PROVIDER_SITE_OTHER): Payer: Medicare Other | Admitting: Family Medicine

## 2023-08-03 ENCOUNTER — Encounter: Payer: Self-pay | Admitting: Family Medicine

## 2023-08-03 VITALS — BP 166/82 | HR 96 | Ht 63.0 in | Wt 152.1 lb

## 2023-08-03 DIAGNOSIS — F332 Major depressive disorder, recurrent severe without psychotic features: Secondary | ICD-10-CM

## 2023-08-03 DIAGNOSIS — F411 Generalized anxiety disorder: Secondary | ICD-10-CM | POA: Diagnosis not present

## 2023-08-03 DIAGNOSIS — R7301 Impaired fasting glucose: Secondary | ICD-10-CM

## 2023-08-03 DIAGNOSIS — E785 Hyperlipidemia, unspecified: Secondary | ICD-10-CM | POA: Diagnosis not present

## 2023-08-03 DIAGNOSIS — I10 Essential (primary) hypertension: Secondary | ICD-10-CM | POA: Diagnosis not present

## 2023-08-03 DIAGNOSIS — F322 Major depressive disorder, single episode, severe without psychotic features: Secondary | ICD-10-CM

## 2023-08-03 MED ORDER — MIRTAZAPINE 7.5 MG PO TABS: 7.5000 mg | ORAL_TABLET | Freq: Every day | ORAL | 1 refills | Status: DC

## 2023-08-03 NOTE — Patient Instructions (Addendum)
F/U in 14 to 16 weeks, call if you need me sooner  New medication Remeron is prescribed for depression and anxiety, and you are referred to Psychiatry and to a therapist   Start bystolic 10 mg take half daily continue amlodipine 10 mg daily  Fasting lipid, cmp and EGFr and hBA1C  Thanks for choosing [facility], we consider it a privelige to serve you.

## 2023-08-29 DIAGNOSIS — R7301 Impaired fasting glucose: Secondary | ICD-10-CM | POA: Insufficient documentation

## 2023-08-29 NOTE — Assessment & Plan Note (Signed)
Score of 21 in 07/2023, not suicidal or homicidal, will refer to Psych she is in agreememnt

## 2023-08-29 NOTE — Assessment & Plan Note (Signed)
Hyperlipidemia:Low fat diet discussed and encouraged.   Lipid Panel  Lab Results  Component Value Date   CHOL 232 (H) 06/01/2023   HDL 54 06/01/2023   LDLCALC 153 (H) 06/01/2023   TRIG 142 06/01/2023   CHOLHDL 4.3 06/01/2023     Updated lab needed at/ before next visit.

## 2023-08-29 NOTE — Progress Notes (Signed)
Shirley Bush     MRN: [id number]      DOB: 11-20-1955  Chief Complaint  Patient presents with   Follow-up    Follow up    HPI Shirley Bush is here for follow up and re-evaluation of chronic medical conditions, medication management and review of any available recent lab and radiology data.  Preventive health is updated, specifically  Cancer screening and Immunization.   Questions or concerns regarding consultations or procedures which the PT has had in the interim are  addressed. Ongoing severe depression, not suicidal or homicidal also has anxiety. NOw desirous of mental health management again, has been in the past   ROS Denies recent fever or chills. Denies sinus pressure, nasal congestion, ear pain or sore throat. Denies chest congestion, productive cough or wheezing. Denies chest pains, palpitations and leg swelling Denies abdominal pain, nausea, vomiting,diarrhea or constipation.   Denies dysuria, frequency, hesitancy or incontinence. Denies joint pain, swelling and limitation in mobility. Denies headaches, seizures, numbness, or tingling. . Denies skin break down or rash.   PE  BP (!) 166/82 (BP Location: Left Arm, Patient Position: Sitting, Cuff Size: Large)   Pulse 96   Ht 5\' 3"  (1.6 m)   Wt 152 lb 1.3 oz (69 kg)   SpO2 98%   BMI 26.94 kg/m   Patient alert and oriented and in no cardiopulmonary distress.  HEENT: No facial asymmetry, EOMI,     Neck supple .  Chest: Clear to auscultation bilaterally.  CVS: S1, S2 no murmurs, no S3.Regular rate.  ABD: Soft non tender.   Ext: No edema  MS: Adequate ROM spine, shoulders, hips and knees.  Skin: Intact, no ulcerations or rash noted.  Psych: Good eye contact, normal affect. Memory intact mildly  anxious or depressed appearing.  CNS: CN 2-12 intact, power,  normal throughout.no focal deficits noted.   Assessment & Plan  Essential hypertension DASH diet and commitment to daily physical activity for a  minimum of 30 minutes discussed and encouraged, as a part of hypertension management. The importance of attaining a healthy weight is also discussed.     08/03/2023    2:40 PM 08/03/2023    2:38 PM 05/30/2023    1:47 PM 05/30/2023    1:46 PM 05/30/2023    1:09 PM 05/30/2023    1:08 PM 05/24/2023    1:28 PM  BP/Weight  Systolic BP 166 155 128 130 151 157 132  Diastolic BP 82 79 80 82 74 79 84  Wt. (Lbs)  152.08    149.08 150.3  BMI  26.94 kg/m2    26.41 kg/m2 26.62 kg/m2     Uncontrolled , pt to start bystolic which had been prescribed by cardiology 2 months ago  GAD (generalized anxiety disorder) Has d/c buspar and lexapro. New is bedtime remeron and also referred to Psych and therapy  Depression, major, severe recurrence (HCC) Score of 21 in 07/2023, not suicidal or homicidal, will refer to Psych she is in agreememnt  Hyperlipidemia Hyperlipidemia:Low fat diet discussed and encouraged.   Lipid Panel  Lab Results  Component Value Date   CHOL 232 (H) 06/01/2023   HDL 54 06/01/2023   LDLCALC 153 (H) 06/01/2023   TRIG 142 06/01/2023   CHOLHDL 4.3 06/01/2023     Updated lab needed at/ before next visit.   IFG (impaired fasting glucose) Patient educated about the importance of limiting  Carbohydrate intake , the need to commit to daily physical activity for a  minimum of 30 minutes , and to commit weight loss. The fact that changes in all these areas will reduce or eliminate all together the development of diabetes is stressed.      Latest Ref Rng & Units 06/01/2023    8:12 AM 10/30/2022    8:15 AM 02/03/2022    9:09 AM 01/30/2022    8:27 AM 01/24/2021    9:16 AM  Diabetic Labs  HbA1c 4.8 - 5.6 % 5.9    5.9  5.8    CANCELED   Chol 100 - 199 mg/dL 161    096  045   HDL >40 mg/dL 54    61  58   Calc LDL 0 - 99 mg/dL 981    191  478   Triglycerides 0 - 149 mg/dL 295    85  621   Creatinine 0.57 - 1.00 mg/dL 3.08  6.57  8.46  9.62  0.81       08/03/2023    2:40 PM  08/03/2023    2:38 PM 05/30/2023    1:47 PM 05/30/2023    1:46 PM 05/30/2023    1:09 PM 05/30/2023    1:08 PM 05/24/2023    1:28 PM  BP/Weight  Systolic BP 166 155 128 130 151 157 132  Diastolic BP 82 79 80 82 74 79 84  Wt. (Lbs)  152.08    149.08 150.3  BMI  26.94 kg/m2    26.41 kg/m2 26.62 kg/m2       No data to display          Updated lab needed at/ before next visit.

## 2023-08-29 NOTE — Assessment & Plan Note (Signed)
Has d/c buspar and lexapro. New is bedtime remeron and also referred to Psych and therapy

## 2023-08-29 NOTE — Assessment & Plan Note (Addendum)
DASH diet and commitment to daily physical activity for a minimum of 30 minutes discussed and encouraged, as a part of hypertension management. The importance of attaining a healthy weight is also discussed.     08/03/2023    2:40 PM 08/03/2023    2:38 PM 05/30/2023    1:47 PM 05/30/2023    1:46 PM 05/30/2023    1:09 PM 05/30/2023    1:08 PM 05/24/2023    1:28 PM  BP/Weight  Systolic BP 166 155 128 130 151 157 132  Diastolic BP 82 79 80 82 74 79 84  Wt. (Lbs)  152.08    149.08 150.3  BMI  26.94 kg/m2    26.41 kg/m2 26.62 kg/m2     Uncontrolled , pt to start bystolic which had been prescribed by cardiology 2 months ago

## 2023-08-29 NOTE — Assessment & Plan Note (Signed)
Patient educated about the importance of limiting  Carbohydrate intake , the need to commit to daily physical activity for a minimum of 30 minutes , and to commit weight loss. The fact that changes in all these areas will reduce or eliminate all together the development of diabetes is stressed.      Latest Ref Rng & Units 06/01/2023    8:12 AM 10/30/2022    8:15 AM 02/03/2022    9:09 AM 01/30/2022    8:27 AM 01/24/2021    9:16 AM  Diabetic Labs  HbA1c 4.8 - 5.6 % 5.9    5.9  5.8    CANCELED   Chol 100 - 199 mg/dL 161    096  045   HDL >40 mg/dL 54    61  58   Calc LDL 0 - 99 mg/dL 981    191  478   Triglycerides 0 - 149 mg/dL 295    85  621   Creatinine 0.57 - 1.00 mg/dL 3.08  6.57  8.46  9.62  0.81       08/03/2023    2:40 PM 08/03/2023    2:38 PM 05/30/2023    1:47 PM 05/30/2023    1:46 PM 05/30/2023    1:09 PM 05/30/2023    1:08 PM 05/24/2023    1:28 PM  BP/Weight  Systolic BP 166 155 128 130 151 157 132  Diastolic BP 82 79 80 82 74 79 84  Wt. (Lbs)  152.08    149.08 150.3  BMI  26.94 kg/m2    26.41 kg/m2 26.62 kg/m2       No data to display          Updated lab needed at/ before next visit.

## 2023-08-31 ENCOUNTER — Ambulatory Visit: Payer: Medicare Other | Admitting: Professional Counselor

## 2023-08-31 DIAGNOSIS — F332 Major depressive disorder, recurrent severe without psychotic features: Secondary | ICD-10-CM

## 2023-08-31 NOTE — BH Specialist Note (Cosign Needed)
Collaborative Care Initial Assessment  Session Start time: 3:00   Session End time: 4:00  Total time in minutes: 60 min   Type of Contact:  Face to face Patient consent obtained:  Yes  Types of Service: Collaborative care  Summary  Patient is a 68 yo female being referred to collaborative care by her pcp for anxiety and depression. Patient was engaged and cooperative during session.    Reason for referral in patient/family's own words:  "Depression and anxiety"  Patient's goal for today's visit: "I don't even know"  History of Present illness:   The patient is a 68 year old female with a history of depression and anxiety who presented for a collaborative care assessment. She reports feeling down, depressed, and hopeless at times, along with low energy, poor appetite, and persistent feelings of failure. She also experiences significant anxiety, describing herself as nervous, on edge, frequently worrying, and easily annoyed. Her current PHQ-9 score is 17, indicating moderately severe depression, while her GAD-7 score is 13, reflecting moderate anxiety. 3 The patient reports that her struggles began after a work-related accident in 2001, which required surgery. Since that procedure, she has experienced frequent headaches and feels she never fully recovered. She continued working until she suffered another accident that led to further injury and surgery, eventually forcing her to leave the workforce. She describes the adjustment as challenging and reports ongoing stress related to her health, medications, and caregiving responsibilities. 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  While the patient is currently taking Remeron (Mirtazapine) for sleep, she is not on an antidepressant. She has no history of bipolar disorder, psychiatric hospitalizations, suicide attempts, or substance abuse. However, she does endorse  a significant trauma history that she has not processed. She accepted the collaborative care referral primarily because her doctor recommended it, expressing uncertainty about her treatment goals but a general desire for support.  The plan moving forward includes a psychiatric consultation to evaluate the most appropriate care approach, including potential medication management. The patient will be provided with education about collaborative care and other treatment options, allowing her to make an informed decision about her preferred course of action. Behavioral activation strategies and trauma-focused therapy will also be considered based on her readiness. A follow-up appointment will be scheduled after the psychiatric consultation to review recommendations, finalize the care plan, and determine if collaborative care is the best fit for her needs.  Clinical Assessment   PHQ-9 Assessments:    08/31/2023    3:08 PM 08/03/2023    3:21 PM 08/03/2023    2:39 PM 05/30/2023    1:09 PM 01/11/2023    1:07 PM  Depression screen PHQ 2/9  Decreased Interest 3 3 0 3 3  Down, Depressed, Hopeless 3 3 0 3 3  PHQ - 2 Score 6 6 0 6 6  Altered sleeping 0 3  3 3   Tired, decreased energy 3 3  3 2   Change in appetite 3 3  3 3   Feeling bad or failure about yourself  3 3  3 3   Trouble concentrating 2 3  2 1   Moving slowly or fidgety/restless 0 0  1 0  Suicidal thoughts 0 0  0 0  PHQ-9 Score 17 21  21 18   Difficult doing work/chores Very difficult Extremely dIfficult   Somewhat difficult    GAD-7 Assessments:    08/31/2023    3:09 PM 08/03/2023  3:29 PM 05/30/2023    1:09 PM 01/11/2023    1:07 PM  GAD 7 : Generalized Anxiety Score  Nervous, Anxious, on Edge 2 2 2 1   Control/stop worrying 2 1 1 1   Worry too much - different things 2 2 1 1   Trouble relaxing 2 1 1  0  Restless 2 0 1 0  Easily annoyed or irritable 3 3 3 3   Afraid - awful might happen 0 0 0 0  Total GAD 7 Score 13 9 9 6   Anxiety  Difficulty Very difficult  Somewhat difficult Somewhat difficult     Social History:  Household: Son, and Educational psychologist Marital status: Single Number of Children: 3 Employment: Disability Education:   Psychiatric Review of systems: Insomnia: No Changes in appetite:  Decreased need for sleep: No Family history of bipolar disorder: No Hallucinations: No   Paranoia: No    Psychotropic medications: Current medications: Remron Patient taking medications as prescribed:  Yes Side effects reported: No   Current medications (medication list) Current Outpatient Medications on File Prior to Visit  Medication Sig Dispense Refill   Alpha-D-Galactosidase (BEANO) TABS Take 1 tablet by mouth daily as needed (gas).     amLODipine (NORVASC) 10 MG tablet Take 1 tablet (10 mg total) by mouth daily. (Patient taking differently: Take 10 mg by mouth at bedtime.) 90 tablet 1   Aspirin-Acetaminophen (GOODYS BACK & BODY PAIN) 500-325 MG PACK Take 1 packet by mouth daily as needed (pain).     aspirin-acetaminophen-caffeine (EXCEDRIN MIGRAINE) 250-250-65 MG tablet Take 2 tablets by mouth every 6 (six) hours as needed for headache.     benzonatate (TESSALON) 100 MG capsule Take 1 capsule (100 mg total) by mouth 2 (two) times daily as needed for cough. 20 capsule 0   BLACK ELDERBERRY PO Take 1 capsule by mouth 2 (two) times daily.     diphenhydrAMINE (BENADRYL) 25 MG tablet Take 25 mg by mouth every 6 (six) hours as needed for itching.     DM-APAP-CPM (CORICIDIN HBP PO) Take 2 tablets by mouth at bedtime as needed (congestion).     FIBER ADULT GUMMIES PO Take 1 capsule by mouth daily.     fluticasone (FLONASE) 50 MCG/ACT nasal spray Place 2 sprays into both nostrils daily. 16 g 6   gabapentin (NEURONTIN) 100 MG capsule TAKE 1 TO 2 CAPSULES BY MOUTH AT BEDTIME FOR PAIN 60 capsule 0   Iron-Vitamin C (VITRON-C PO) Take 1 tablet by mouth daily.     mirtazapine (REMERON) 7.5 MG tablet Take 1 tablet (7.5 mg total) by  mouth at bedtime. 30 tablet 1   montelukast (SINGULAIR) 10 MG tablet Take 1 tablet (10 mg total) by mouth at bedtime. 30 tablet 3   Multiple Vitamin (MULTIVITAMIN WITH MINERALS) TABS tablet Take 1 tablet by mouth daily.     Multiple Vitamins-Minerals (HAIR SKIN AND NAILS FORMULA) TABS Take 2 tablets by mouth daily.     nebivolol (BYSTOLIC) 10 MG tablet Take 1 tablet (10 mg total) by mouth at bedtime. 90 tablet 3   Current Facility-Administered Medications on File Prior to Visit  Medication Dose Route Frequency Provider Last Rate Last Admin   sodium chloride flush (NS) 0.9 % injection 3 mL  3 mL Intravenous Q12H Chilton Si, MD        Psychiatric History: Past psychiatry diagnosis: Depression, anxiety Patient currently being seen by therapist/psychiatrist: None Prior Suicide Attempts: None Past psychiatry Hospitalization(s): No Past history of violence: No  Traumatic Experiences: History  or current traumatic events (natural disaster, house fire, etc.)? no History or current physical trauma?  yes History or current emotional trauma?  yes History or current sexual trauma?  yes History or current domestic or intimate partner violence?  no PTSD symptoms if any traumatic experiences: no  Alcohol and/or Substance Use History   Tobacco Alcohol Other substances  Current use  Hasn't drank in a couple of years   Past use  Very moderate drinker 1-2 a month   Past treatment      Withdrawal Potential: None  Self-harm Behaviors Risk Assessment Self-harm risk factors: Depression, Trauma,  Patient endorses recent thoughts of harming self: Denies  Guns in the home: No   Protective factors: Has son and grandson and hope  Danger to Others Risk Assessment Danger to others risk factors:  None Patient endorses recent thoughts of harming others:  Denies Dynamic Appraisal of Situational Aggression (DASA):   BH Counselor discussed emergency crisis plan with client and provided local  emergency services resources.  Mental status exam:   General Appearance Shirley Bush:  Casual Eye Contact:  Good Motor Behavior:  Normal Speech:  Normal Level of Consciousness:  Alert Mood:  Negative Affect:  Appropriate Anxiety Level:  Minimal Thought Process:  Coherent Thought Content:  WNL Perception:  Normal Judgment:  Good Insight:  Present  Diagnosis:   Goals: Increase healthy adjustment to current life circumstances   Interventions: CBT Cognitive Behavioral Therapy

## 2023-08-31 NOTE — Patient Instructions (Signed)
If your symptoms worsen or you have thoughts of suicide/homicide, PLEASE SEEK IMMEDIATE MEDICAL ATTENTION.  You may always call:   National Suicide Hotline: 988 or (913)195-1115 [place] Crisis Line: 343-675-8477 Crisis Recovery in [place]: [phone]     These are available 24 hours a day, 7 days a week.

## 2023-08-31 NOTE — BH Specialist Note (Incomplete Revision)
Collaborative Care Initial Assessment  Session Start time: 3:00   Session End time: 4:00  Total time in minutes: 60 min   Type of Contact:  Face to face Patient consent obtained:  Yes  Types of Service: Collaborative care  Summary  Patient is a 68 yo female being referred to collaborative care by her pcp for anxiety and depression. Patient was engaged and cooperative during session.    Reason for referral in patient/family's own words:  "Depression and anxiety"  Patient's goal for today's visit: "I don't even know"  History of Present illness:   Reports that go injured at work injured knee under went knee surgeries and never was the same. Says she has headaches and feels like something happened durrig procedure.  Reports not being able to work after that. Depression increased. Got on disability.  Reports working and had an accident in 2001. Started experiencing depression and started medication. Lives with son and grandson. Says he has ptsd so he acts as a Facilities manager. Medical conditions contribute to stress. Irritability from taking medicine. Been watching her nephew too.    Clinical Assessment   PHQ-9 Assessments:    08/31/2023    3:08 PM 08/03/2023    3:21 PM 08/03/2023    2:39 PM 05/30/2023    1:09 PM 01/11/2023    1:07 PM  Depression screen PHQ 2/9  Decreased Interest 3 3 0 3 3  Down, Depressed, Hopeless 3 3 0 3 3  PHQ - 2 Score 6 6 0 6 6  Altered sleeping 0 3  3 3   Tired, decreased energy 3 3  3 2   Change in appetite 3 3  3 3   Feeling bad or failure about yourself  3 3  3 3   Trouble concentrating 2 3  2 1   Moving slowly or fidgety/restless 0 0  1 0  Suicidal thoughts 0 0  0 0  PHQ-9 Score 17 21  21 18   Difficult doing work/chores Very difficult Extremely dIfficult   Somewhat difficult    GAD-7 Assessments:    08/31/2023    3:09 PM 08/03/2023    3:29 PM 05/30/2023    1:09 PM 01/11/2023    1:07 PM  GAD 7 : Generalized Anxiety Score  Nervous, Anxious, on Edge 2 2  2 1   Control/stop worrying 2 1 1 1   Worry too much - different things 2 2 1 1   Trouble relaxing 2 1 1  0  Restless 2 0 1 0  Easily annoyed or irritable 3 3 3 3   Afraid - awful might happen 0 0 0 0  Total GAD 7 Score 13 9 9 6   Anxiety Difficulty Very difficult  Somewhat difficult Somewhat difficult     Social History:  Household: Son, and Educational psychologist Marital status: Single Number of Children: 3 Employment: Disability Education:   Psychiatric Review of systems: Insomnia: No Changes in appetite:  Decreased need for sleep: No Family history of bipolar disorder: No Hallucinations: No   Paranoia: No    Psychotropic medications: Current medications: Remron Patient taking medications as prescribed:  Yes Side effects reported: No   Current medications (medication list) Current Outpatient Medications on File Prior to Visit  Medication Sig Dispense Refill   Alpha-D-Galactosidase (BEANO) TABS Take 1 tablet by mouth daily as needed (gas).     amLODipine (NORVASC) 10 MG tablet Take 1 tablet (10 mg total) by mouth daily. (Patient taking differently: Take 10 mg by mouth at bedtime.) 90 tablet 1   Aspirin-Acetaminophen (  GOODYS BACK & BODY PAIN) 500-325 MG PACK Take 1 packet by mouth daily as needed (pain).     aspirin-acetaminophen-caffeine (EXCEDRIN MIGRAINE) 250-250-65 MG tablet Take 2 tablets by mouth every 6 (six) hours as needed for headache.     benzonatate (TESSALON) 100 MG capsule Take 1 capsule (100 mg total) by mouth 2 (two) times daily as needed for cough. 20 capsule 0   BLACK ELDERBERRY PO Take 1 capsule by mouth 2 (two) times daily.     diphenhydrAMINE (BENADRYL) 25 MG tablet Take 25 mg by mouth every 6 (six) hours as needed for itching.     DM-APAP-CPM (CORICIDIN HBP PO) Take 2 tablets by mouth at bedtime as needed (congestion).     FIBER ADULT GUMMIES PO Take 1 capsule by mouth daily.     fluticasone (FLONASE) 50 MCG/ACT nasal spray Place 2 sprays into both nostrils daily. 16 g 6    gabapentin (NEURONTIN) 100 MG capsule TAKE 1 TO 2 CAPSULES BY MOUTH AT BEDTIME FOR PAIN 60 capsule 0   Iron-Vitamin C (VITRON-C PO) Take 1 tablet by mouth daily.     mirtazapine (REMERON) 7.5 MG tablet Take 1 tablet (7.5 mg total) by mouth at bedtime. 30 tablet 1   montelukast (SINGULAIR) 10 MG tablet Take 1 tablet (10 mg total) by mouth at bedtime. 30 tablet 3   Multiple Vitamin (MULTIVITAMIN WITH MINERALS) TABS tablet Take 1 tablet by mouth daily.     Multiple Vitamins-Minerals (HAIR SKIN AND NAILS FORMULA) TABS Take 2 tablets by mouth daily.     nebivolol (BYSTOLIC) 10 MG tablet Take 1 tablet (10 mg total) by mouth at bedtime. 90 tablet 3   Current Facility-Administered Medications on File Prior to Visit  Medication Dose Route Frequency Provider Last Rate Last Admin   sodium chloride flush (NS) 0.9 % injection 3 mL  3 mL Intravenous Q12H Chilton Si, MD        Psychiatric History: Past psychiatry diagnosis: Depression, anxiety Patient currently being seen by therapist/psychiatrist: None Prior Suicide Attempts: None Past psychiatry Hospitalization(s): No Past history of violence: No  Traumatic Experiences: History or current traumatic events (natural disaster, house fire, etc.)? no History or current physical trauma?  yes History or current emotional trauma?  yes History or current sexual trauma?  yes History or current domestic or intimate partner violence?  no PTSD symptoms if any traumatic experiences: no  Alcohol and/or Substance Use History   Tobacco Alcohol Other substances  Current use  Hasn't drank in a couple of years   Past use  Very moderate drinker 1-2 a month   Past treatment      Withdrawal Potential: None  Self-harm Behaviors Risk Assessment Self-harm risk factors: Depression, Trauma,  Patient endorses recent thoughts of harming self: Denies  Guns in the home: No   Protective factors: Has son and grandson and hope  Danger to Others Risk  Assessment Danger to others risk factors:   Patient endorses recent thoughts of harming others:   Dynamic Appraisal of Situational Aggression (DASA):   BH Counselor discussed emergency crisis plan with client and provided local emergency services resources.  Mental status exam:   General Appearance Shirley Bush:  Casual Eye Contact:  Good Motor Behavior:  Normal Speech:  Normal Level of Consciousness:  Alert Mood:  Negative Affect:  Appropriate Anxiety Level:  Minimal Thought Process:  Coherent Thought Content:  WNL Perception:  Normal Judgment:  Good Insight:  Present  Diagnosis:   Goals: Increase healthy adjustment  to current life circumstances   Interventions: CBT Cognitive Behavioral Therapy

## 2023-09-05 ENCOUNTER — Ambulatory Visit (INDEPENDENT_AMBULATORY_CARE_PROVIDER_SITE_OTHER): Payer: Medicare Other

## 2023-09-05 VITALS — BP 128/80 | Ht 63.0 in | Wt 152.0 lb

## 2023-09-05 DIAGNOSIS — Z0000 Encounter for general adult medical examination without abnormal findings: Secondary | ICD-10-CM | POA: Diagnosis not present

## 2023-09-05 NOTE — Patient Instructions (Signed)
Shirley Bush , Thank you for taking time to come for your Medicare Wellness Visit. I appreciate your ongoing commitment to your health goals. Please review the following plan we discussed and let me know if I can assist you in the future.   Referrals/Orders/Follow-Ups/Clinician Recommendations:  Next Medicare Annual Wellness Visit:   September 09, 2024 at 8:00 am  virtual visit  This is a list of the screening recommended for you and due dates:  Health Maintenance  Topic Date Due   Colon Cancer Screening  01/14/2024   Mammogram  07/25/2024   Medicare Annual Wellness Visit  09/04/2024   DEXA scan (bone density measurement)  07/25/2025   Pap with HPV screening  05/29/2028   DTaP/Tdap/Td vaccine (4 - Td or Tdap) 09/05/2032   Pneumonia Vaccine  Completed   Flu Shot  Completed   COVID-19 Vaccine  Completed   Hepatitis C Screening  Completed   HIV Screening  Completed   HPV Vaccine  Aged Out   Zoster (Shingles) Vaccine  Discontinued    Advanced directives: (ACP Link)Information on Advanced Care Planning can be found at [facility] Advance Health Care Directives Advance Health Care Directives ([email or web address]   Next Medicare Annual Wellness Visit scheduled for next year: yes  Understanding Your Risk for Falls Millions of people have serious injuries from falls each year. It is important to understand your risk of falling. Talk with your health care provider about your risk and what you can do to lower it. If you do have a serious fall, make sure to tell your provider. Falling once raises your risk of falling again. How can falls affect me? Serious injuries from falls are common. These include: Broken bones, such as hip fractures. Head injuries, such as traumatic brain injuries (TBI) or concussions. A fear of falling can cause you to avoid activities and stay at home. This can make your muscles weaker and raise your risk for a fall. What can increase my risk? There are a  number of risk factors that increase your risk for falling. The more risk factors you have, the higher your risk of falling. Serious injuries from a fall happen most often to people who are older than 66 years old. Teenagers and young adults ages 64-29 are also at higher risk. Common risk factors include: Weakness in the lower body. Being generally weak or confused due to long-term (chronic) illness. Dizziness or balance problems. Poor vision. Medicines that cause dizziness or drowsiness. These may include: Medicines for your blood pressure, heart, anxiety, insomnia, or swelling (edema). Pain medicines. Muscle relaxants. Other risk factors include: Drinking alcohol. Having had a fall in the past. Having foot pain or wearing improper footwear. Working at a dangerous job. Having any of the following in your home: Tripping hazards, such as floor clutter or loose rugs. Poor lighting. Pets. Having dementia or memory loss. What actions can I take to lower my risk of falling?     Physical activity Stay physically fit. Do strength and balance exercises. Consider taking a regular class to build strength and balance. Yoga and tai chi are good options. Vision Have your eyes checked every year and your prescription for glasses or contacts updated as needed. Shoes and walking aids Wear non-skid shoes. Wear shoes that have rubber soles and low heels. Do not wear high heels. Do not walk around the house in socks or slippers. Use a cane or walker as told by your provider. Home safety Attach secure railings  on both sides of your stairs. Install grab bars for your bathtub, shower, and toilet. Use a non-skid mat in your bathtub or shower. Attach bath mats securely with double-sided, non-slip rug tape. Use good lighting in all rooms. Keep a flashlight near your bed. Make sure there is a clear path from your bed to the bathroom. Use night-lights. Do not use throw rugs. Make sure all carpeting is  taped or tacked down securely. Remove all clutter from walkways and stairways, including extension cords. Repair uneven or broken steps and floors. Avoid walking on icy or slippery surfaces. Walk on the grass instead of on icy or slick sidewalks. Use ice melter to get rid of ice on walkways in the winter. Use a cordless phone. Questions to ask your health care provider Can you help me check my risk for a fall? Do any of my medicines make me more likely to fall? Should I take a vitamin D supplement? What exercises can I do to improve my strength and balance? Should I make an appointment to have my vision checked? Do I need a bone density test to check for weak bones (osteoporosis)? Would it help to use a cane or a walker? Where to find more information Centers for Disease Control and Prevention, STEADI: TonerPromos.no Community-Based Fall Prevention Programs: TonerPromos.no [employer] on Aging: BaseRingTones.pl Contact a health care provider if: You fall at home. You are afraid of falling at home. You feel weak, drowsy, or dizzy. This information is not intended to replace advice given to you by your health care provider. Make sure you discuss any questions you have with your health care provider. Document Revised: 03/06/2022 Document Reviewed: 03/06/2022 Elsevier Patient Education  2024 [employer].

## 2023-09-05 NOTE — Progress Notes (Signed)
Please attest and cosign this visit due to patients primary care provider not being in the office at the time the visit was completed.   Because this visit was a virtual/telehealth visit,  certain criteria was not obtained, such a blood pressure, CBG if applicable, and timed get up and go. Any medications not marked as "taking" were not mentioned during the medication reconciliation part of the visit. Any vitals not documented were not able to be obtained due to this being a telehealth visit or patient was unable to self-report a recent blood pressure reading due to a lack of equipment at home via telehealth. Vitals that have been documented are verbally provided by the patient.  Interactive audio and video telecommunications were attempted between this provider and patient, however failed, due to patient having technical difficulties OR patient did not have access to video capability.  We continued and completed visit with audio only.  Subjective:   Shirley Bush is a 68 y.o. female who presents for Medicare Annual (Subsequent) preventive examination.  Visit Complete: Virtual I connected with  FARRA NIKOLIC on 09/05/23 by a audio enabled telemedicine application and verified that I am speaking with the correct person using two identifiers.  Patient Location: Home  Provider Location: Home Office  I discussed the limitations of evaluation and management by telemedicine. The patient expressed understanding and agreed to proceed.  Vital Signs: Because this visit was a virtual/telehealth visit, some criteria may be missing or patient reported. Any vitals not documented were not able to be obtained and vitals that have been documented are patient reported.  Cardiac Risk Factors include: advanced age (>33men, >54 women);sedentary lifestyle     Objective:    Today's Vitals   09/05/23 0937  BP: 128/80  Weight: 152 lb (68.9 kg)  Height: 5\' 3"  (1.6 m)   Body mass index is 26.93  kg/m.     11/08/2022    8:44 AM 08/07/2022    4:44 PM 10/14/2021    1:10 PM 08/01/2021    9:32 AM 07/26/2020   10:34 AM 01/14/2019    9:01 AM 07/22/2018    8:46 AM  Advanced Directives  Does Patient Have a Medical Advance Directive? No No No No No No No  Would patient like information on creating a medical advance directive? No - Patient declined No - Patient declined No - Patient declined Yes (ED - Information included in AVS) No - Patient declined No - Patient declined Yes (ED - Information included in AVS)    Current Medications (verified) Outpatient Encounter Medications as of 09/05/2023  Medication Sig   Alpha-D-Galactosidase (BEANO) TABS Take 1 tablet by mouth daily as needed (gas).   amLODipine (NORVASC) 10 MG tablet Take 1 tablet (10 mg total) by mouth daily. (Patient taking differently: Take 10 mg by mouth at bedtime.)   Aspirin-Acetaminophen (GOODYS BACK & BODY PAIN) 500-325 MG PACK Take 1 packet by mouth daily as needed (pain).   aspirin-acetaminophen-caffeine (EXCEDRIN MIGRAINE) 250-250-65 MG tablet Take 2 tablets by mouth every 6 (six) hours as needed for headache.   benzonatate (TESSALON) 100 MG capsule Take 1 capsule (100 mg total) by mouth 2 (two) times daily as needed for cough.   BLACK ELDERBERRY PO Take 1 capsule by mouth 2 (two) times daily.   diphenhydrAMINE (BENADRYL) 25 MG tablet Take 25 mg by mouth every 6 (six) hours as needed for itching.   DM-APAP-CPM (CORICIDIN HBP PO) Take 2 tablets by mouth at bedtime as needed (congestion).  FIBER ADULT GUMMIES PO Take 1 capsule by mouth daily.   fluticasone (FLONASE) 50 MCG/ACT nasal spray Place 2 sprays into both nostrils daily.   gabapentin (NEURONTIN) 100 MG capsule TAKE 1 TO 2 CAPSULES BY MOUTH AT BEDTIME FOR PAIN   Iron-Vitamin C (VITRON-C PO) Take 1 tablet by mouth daily.   mirtazapine (REMERON) 7.5 MG tablet Take 1 tablet (7.5 mg total) by mouth at bedtime.   montelukast (SINGULAIR) 10 MG tablet Take 1 tablet (10 mg  total) by mouth at bedtime.   Multiple Vitamin (MULTIVITAMIN WITH MINERALS) TABS tablet Take 1 tablet by mouth daily.   Multiple Vitamins-Minerals (HAIR SKIN AND NAILS FORMULA) TABS Take 2 tablets by mouth daily.   nebivolol (BYSTOLIC) 10 MG tablet Take 1 tablet (10 mg total) by mouth at bedtime.   Facility-Administered Encounter Medications as of 09/05/2023  Medication   sodium chloride flush (NS) 0.9 % injection 3 mL    Allergies (verified) Lisinopril, Maxzide [hydrochlorothiazide w-triamterene], Beano meltaways [alpha-d-galactosidase], Buspar [buspirone], Crestor [rosuvastatin calcium], Hydrochlorothiazide, Paxil [paroxetine], and Simethicone   History: Past Medical History:  Diagnosis Date   Allergy Don't remember   Don't remember   Anxiety    Arthritis In 2007   In both of my shoulders and my knees   Depression 2002   Depression    Phreesia 06/28/2020   Depression    Phreesia 07/23/2020   Encounter for annual physical exam 12/03/2015   Fibromyalgia    GERD (gastroesophageal reflux disease) 01/23/2022   Heart murmur Don't remember   I was in 20s   Hypertension 2004   Medial meniscus tear 04/2013   left   Migraines    Past Surgical History:  Procedure Laterality Date   CESAREAN SECTION  1978   CESAREAN SECTION  1980   CESAREAN SECTION  1992   CESAREAN SECTION N/A    Phreesia 06/28/2020   COLONOSCOPY  02/05/2009   COLONOSCOPY N/A 03/06/2014   Procedure: COLONOSCOPY;  Surgeon: West Bali, MD;  Location: [facility];  Service: Endoscopy;  Laterality: N/A;  10:30 AM   COLONOSCOPY WITH PROPOFOL N/A 01/14/2019   Surgeon: West Bali, MD;  one 3 mm tubular adenoma in the mid ascending colon, moderate diverticulosis in entire colon, external and internal hemorrhoids, tortuous left colon. Recommended repeat in 5 years.    FINGER SURGERY Right    long finger   HERNIA REPAIR N/A    Phreesia 06/28/2020   JOINT REPLACEMENT N/A    Phreesia 06/28/2020   KNEE  ARTHROSCOPY WITH MEDIAL MENISECTOMY Left 04/24/2013   Procedure: LEFT KNEE ARTHROSCOPY CHONDROPLASTY WITH MEDIAL MENISECTOMY;  Surgeon: Loreta Ave, MD;  Location: [facility];  Service: Orthopedics;  Laterality: Left;   RENAL DENERVATION N/A 11/08/2022   Procedure: RENAL DENERVATION;  Surgeon: Iran Ouch, MD;  Location: [facility];  Service: Cardiovascular;  Laterality: N/A;   SHOULDER ARTHROSCOPY Left 2009   SHOULDER ARTHROSCOPY Right    SHOULDER ARTHROSCOPY WITH ROTATOR CUFF REPAIR Left 11/25/2004   TOTAL KNEE ARTHROPLASTY Left 11/11/2014   Procedure: LEFT TOTAL KNEE ARTHROPLASTY;  Surgeon: Mckinley Jewel, MD;  Location: [facility] OR;  Service: Orthopedics;  Laterality: Left;   TUBAL LIGATION  1992   UMBILICAL HERNIA REPAIR  2008   Family History  Problem Relation Age of Onset   Heart attack Maternal Grandmother    Hypertension Father    Alcohol abuse Father    Seizures Father    Hypertension Mother  Bone cancer Mother    Hypertension Sister    Migraines Sister    Thyroid disease Sister    Colon cancer Sister        diagnosed in late 64s.    Thyroid disease Sister    Cancer Sister        breast    Cancer Sister    Cancer Maternal Aunt        gyne cancer   Social History   Socioeconomic History   Marital status: Divorced    Spouse name: Not on file   Number of children: 3   Years of education: 12 grade    Highest education level: Some college, no degree  Occupational History   Occupation: disability   Tobacco Use   Smoking status: Never   Smokeless tobacco: Never  Vaping Use   Vaping status: Never Used  Substance and Sexual Activity   Alcohol use: Not Currently    Comment: occasionally- rarely.    Drug use: No   Sexual activity: Not Currently    Birth control/protection: None, Post-menopausal  Other Topics Concern   Not on file  Social History Narrative   Son is living with her at the moment, patient states that she is isolating in  her home and is depressed more lately    Social Drivers of Corporate investment banker Strain: Low Risk  (09/05/2023)   Overall Financial Resource Strain (CARDIA)    Difficulty of Paying Living Expenses: Not hard at all  Food Insecurity: No Food Insecurity (09/05/2023)   Hunger Vital Sign    Worried About Running Out of Food in the Last Year: Never true    Ran Out of Food in the Last Year: Never true  Transportation Needs: No Transportation Needs (09/05/2023)   PRAPARE - Administrator, Civil Service (Medical): No    Lack of Transportation (Non-Medical): No  Physical Activity: Inactive (09/05/2023)   Exercise Vital Sign    Days of Exercise per Week: 0 days    Minutes of Exercise per Session: 0 min  Stress: No Stress Concern Present (09/05/2023)   [employer] of Occupational Health - Occupational Stress Questionnaire    Feeling of Stress : Not at all  Recent Concern: Stress - Stress Concern Present (08/02/2023)   [employer] of Occupational Health - Occupational Stress Questionnaire    Feeling of Stress : Rather much  Social Connections: Socially Isolated (09/05/2023)   Social Connection and Isolation Panel [NHANES]    Frequency of Communication with Friends and Family: Once a week    Frequency of Social Gatherings with Friends and Family: Never    Attends Religious Services: Never    Database administrator or Organizations: No    Attends Engineer, structural: Never    Marital Status: Divorced    Tobacco Counseling Counseling given: Yes   Clinical Intake:  Pre-visit preparation completed: Yes  Pain : No/denies pain     BMI - recorded: 26.93 Nutritional Status: BMI 25 -29 Overweight Nutritional Risks: None Diabetes: No  How often do you need to have someone help you when you read instructions, pamphlets, or other written materials from your doctor or pharmacy?: 1 - Never  Interpreter Needed?: No  Information entered by :: Maryjean Ka  CMA   Activities of Daily Living    09/05/2023    9:40 AM  In your present state of health, do you have any difficulty performing the following activities:  Hearing? 1  Vision? 0  Difficulty concentrating or making decisions? 0  Walking or climbing stairs? 0  Dressing or bathing? 0  Doing errands, shopping? 0  Preparing Food and eating ? N  Using the Toilet? N  In the past six months, have you accidently leaked urine? N  Do you have problems with loss of bowel control? N  Managing your Medications? N  Managing your Finances? N  Housekeeping or managing your Housekeeping? N    Patient Care Team: Kerri Perches, MD as PCP - General Marletta Lor, Hennie Duos, DO as Consulting Physician (Gastroenterology) Randa Spike Kelton Pillar, LCSW as [facility] Care Management (Licensed Clinical Social Worker) Reuel Boom as Counselor (Professional Counselor) [facility] Peck, [place] as Consulting Physician (Ophthalmology) Chilton Si, MD as Attending Physician (Cardiology) Iran Ouch, MD as Consulting Physician (Cardiology) Hermina Staggers, MD (Inactive) (Obstetrics and Gynecology) Elesa Hacker, MD as Referring Physician (Dermatology) Alfonse Spruce, MD as Consulting Physician (Allergy and Immunology) Lucille Passy, MD as Referring Physician (Ophthalmology)  Indicate any recent Medical Services you may have received from other than [facility] providers in the past year (date may be approximate).     Assessment:   This is a routine wellness examination for [place].  Hearing/Vision screen Hearing Screening - Comments:: Patient states she does have hearing difficulty. She has seen ENT and will eventually need surgery to correct the problem. Hearing aids will not help  Vision Screening - Comments:: Patient wears reading glasses only. Up to date with yearly exams.  Patient states her reading glasses with a prescription. Does not need to wear them  other than when reading. Patient sees Dr. Daisy Lazar w/ [facility] [place] office.     Goals Addressed             This Visit's Progress    Patient Stated       To just take one day at a time.        Depression Screen    09/05/2023    9:42 AM 08/31/2023    3:08 PM 08/03/2023    3:21 PM 08/03/2023    2:39 PM 05/30/2023    1:09 PM 01/11/2023    1:07 PM 09/22/2022   11:11 AM  PHQ 2/9 Scores  PHQ - 2 Score 6 6 6  0 6 6 6   PHQ- 9 Score 17 17 21  21 18 19     Fall Risk    08/03/2023    2:39 PM 05/30/2023    1:09 PM 01/11/2023    1:07 PM 09/22/2022   11:11 AM 08/07/2022    4:44 PM  Fall Risk   Falls in the past year? 0 0 0 0 0  Number falls in past yr: 0 0 0  0  Injury with Fall? 0 0 0  0  Risk for fall due to : No Fall Risks No Fall Risks No Fall Risks  No Fall Risks  Follow up Falls evaluation completed Falls evaluation completed Falls evaluation completed  Falls evaluation completed    MEDICARE RISK AT HOME: Medicare Risk at Home Any stairs in or around the home?: Yes If so, are there any without handrails?: No Home free of loose throw rugs in walkways, pet beds, electrical cords, etc?: Yes Adequate lighting in your home to reduce risk of falls?: Yes Life alert?: No Use of a cane, walker or w/c?: No Grab bars in the bathroom?: No Shower chair or bench in shower?: No Elevated toilet  seat or a handicapped toilet?: No  TIMED UP AND GO:  Was the test performed?  No    Cognitive Function:        09/05/2023    9:39 AM 08/07/2022    4:46 PM 08/01/2021    9:32 AM 07/26/2020   10:40 AM 07/24/2019    1:56 PM  6CIT Screen  What Year? 0 points 0 points 0 points 0 points 0 points  What month? 0 points 0 points 0 points 0 points 0 points  What time? 0 points 0 points 0 points 0 points 0 points  Count back from 20 0 points 0 points 0 points 0 points 0 points  Months in reverse 0 points 0 points 0 points 0 points 0 points  Repeat phrase 0 points 0 points 0 points 0  points 0 points  Total Score 0 points 0 points 0 points 0 points 0 points    Immunizations Immunization History  Administered Date(s) Administered   Fluad Quad(high Dose 65+) 04/10/2023   Influenza,inj,Quad PF,6+ Mos 05/06/2014, 10/05/2015, 04/27/2016, 03/11/2018, 04/28/2019, 04/16/2020, 04/15/2021, 04/17/2022   Moderna Covid-19 Vaccine Bivalent Booster 58yrs & up 04/28/2022, 04/10/2023   Moderna SARS-COV2 Booster Vaccination 12/30/2020   Moderna Sars-Covid-2 Vaccination 09/18/2019, 10/21/2019, 05/21/2020, 04/15/2021   PNEUMOCOCCAL CONJUGATE-20 05/30/2023   Pfizer(Comirnaty)Fall Seasonal Vaccine 12 years and older 08/09/2023   Td 05/17/2000   Tdap 02/10/2011, 09/05/2022    TDAP status: Up to date  Flu Vaccine status: Up to date  Pneumococcal vaccine status: Up to date  Covid-19 vaccine status: Completed vaccines  Qualifies for Shingles Vaccine? No   Zostavax completed No   Shingrix Completed?: Yes  Screening Tests Health Maintenance  Topic Date Due   Colonoscopy  01/14/2024   MAMMOGRAM  07/25/2024   Medicare Annual Wellness (AWV)  09/04/2024   DEXA SCAN  07/25/2025   Cervical Cancer Screening (HPV/Pap Cotest)  05/29/2028   DTaP/Tdap/Td (4 - Td or Tdap) 09/05/2032   Pneumonia Vaccine 40+ Years old  Completed   INFLUENZA VACCINE  Completed   COVID-19 Vaccine  Completed   Hepatitis C Screening  Completed   HIV Screening  Completed   HPV VACCINES  Aged Out   Zoster Vaccines- Shingrix  Discontinued    Health Maintenance  There are no preventive care reminders to display for this patient.  Colorectal cancer screening: Type of screening: Colonoscopy. Completed 01/14/2019. Repeat every 5 years  Mammogram status: Completed 07/26/2023. Repeat every year  Bone Density status: Completed 07/26/2023. Results reflect: Bone density results: OSTEOPENIA. Repeat every 2 years.  Lung Cancer Screening: (Low Dose CT Chest recommended if Age 20-80 years, 20 pack-year currently smoking  OR have quit w/in 15years.) does not qualify.   Lung Cancer Screening Referral: na  Additional Screening:  Hepatitis C Screening: does not qualify; Completed   Vision Screening: Recommended annual ophthalmology exams for early detection of glaucoma and other disorders of the eye. Is the patient up to date with their annual eye exam?  Yes  Who is the provider or what is the name of the office in which the patient attends annual eye exams? Daisy Lazar If pt is not established with a provider, would they like to be referred to a provider to establish care? No .   Dental Screening: Recommended annual dental exams for proper oral hygiene  Diabetic Foot Exam: na  Community Resource Referral / Chronic Care Management: CRR required this visit?  No   CCM required this visit?  No  Plan:     I have personally reviewed and noted the following in the patient's chart:   Medical and social history Use of alcohol, tobacco or illicit drugs  Current medications and supplements including opioid prescriptions. Patient is not currently taking opioid prescriptions. Functional ability and status Nutritional status Physical activity Advanced directives List of other physicians Hospitalizations, surgeries, and ER visits in previous 12 months Vitals Screenings to include cognitive, depression, and falls Referrals and appointments  In addition, I have reviewed and discussed with patient certain preventive protocols, quality metrics, and best practice recommendations. A written personalized care plan for preventive services as well as general preventive health recommendations were provided to patient.     Jordan Hawks Edyth Glomb, CMA   09/05/2023   After Visit Summary: (MyChart) Due to this being a telephonic visit, the after visit summary with patients personalized plan was offered to patient via MyChart   Nurse Notes: n/a

## 2023-09-06 ENCOUNTER — Encounter (HOSPITAL_BASED_OUTPATIENT_CLINIC_OR_DEPARTMENT_OTHER): Payer: Medicare HMO | Admitting: Family

## 2023-09-06 ENCOUNTER — Telehealth (INDEPENDENT_AMBULATORY_CARE_PROVIDER_SITE_OTHER): Payer: Self-pay | Admitting: Professional Counselor

## 2023-09-06 DIAGNOSIS — F332 Major depressive disorder, recurrent severe without psychotic features: Secondary | ICD-10-CM

## 2023-09-06 DIAGNOSIS — F4310 Post-traumatic stress disorder, unspecified: Secondary | ICD-10-CM

## 2023-09-06 NOTE — BH Specialist Note (Signed)
Virtual Behavioral Health Treatment Plan Team Note  MRN: [id number] NAME: Shirley Bush  DATE: 09/07/23  Start time: Start Time: 1019 End time: Stop Time: 1024 Total time: Total Time in Minutes (Visit): 5  Total number of Virtual BH Treatment Team Plan encounters: 1/4  Treatment Team Attendees: Esmond Harps and Dr. [facility] Care Psychiatric Consultant Case Review    Assessment/Provisional Diagnosis Shirley Bush is a 68 y.o. year old female with history of anxiety and depression. The patient is referred for anxiety and depression.  Currently on Remeron for insomnia and helping. Reports PTSD symptoms too.  PHQ-9 at 17  GAD7 at 26   # PTSD # MDD, Recurrent , moderate.  # insomnia   Recommendation Increase Mirtazapine to 15 mg Start Zoloft 25 mg daily FU in 6-8 weeks.  BH specialist to follow up.     Thank you for your consult. We will continue to follow the patient. Please contact our collaborative care team for any questions or concerns.    I have spent 15 minutes reviewing patient chart, documenting in the chart and discussing with Mr. Lynnell Chad.   Diagnoses:    ICD-10-CM   1. Severe episode of recurrent major depressive disorder, without psychotic features (HCC)  F33.2     2. PTSD (post-traumatic stress disorder)  F43.10       Goals, Interventions and Follow-up Plan Goals: Increase healthy adjustment to current life circumstances Interventions: CBT Cognitive Behavioral Therapy Medication Management Recommendations: Increase Mirtazapine to 15 mg Start Zoloft 25 mg daily Follow-up Plan: Bridge to traditional therapy  History of the present illness Presenting Problem/Current Symptoms:  The patient is a 68 year old female with a history of depression and anxiety who presented for a collaborative care assessment. She reports feeling down, depressed, and hopeless at times, along with low energy, poor appetite, and persistent feelings of failure. She  also experiences significant anxiety, describing herself as nervous, on edge, frequently worrying, and easily annoyed. Her current PHQ-9 score is 17, indicating moderately severe depression, while her GAD-7 score is 13, reflecting moderate anxiety. 3 The patient reports that her struggles began after a work-related accident in 2001, which required surgery. Since that procedure, she has experienced frequent headaches and feels she never fully recovered. She continued working until she suffered another accident that led to further injury and surgery, eventually forcing her to leave the workforce. She describes the adjustment as challenging and reports ongoing stress related to her health, medications, and caregiving responsibilities. 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   While the patient is currently taking Remeron (Mirtazapine) for sleep, she is not on an antidepressant. She has no history of bipolar disorder, psychiatric hospitalizations, suicide attempts, or substance abuse. However, she does endorse a significant trauma history that she has not processed. She accepted the collaborative care referral primarily because her doctor recommended it, expressing uncertainty about her treatment goals but a general desire for support.   The plan moving forward includes a psychiatric consultation to evaluate the most appropriate care approach, including potential medication management. The patient will be provided with education about collaborative care and other treatment options, allowing her to make an informed decision about her preferred course of action. Behavioral activation strategies and trauma-focused therapy will also be considered based on her readiness. A follow-up appointment will be scheduled after the psychiatric consultation to review recommendations, finalize the care plan, and determine if  collaborative care is  the best fit for her needs.   Screenings PHQ-9 Assessments:     09/05/2023    9:42 AM 08/31/2023    3:08 PM 08/03/2023    3:21 PM  Depression screen PHQ 2/9  Decreased Interest 3 3 3   Down, Depressed, Hopeless 3 3 3   PHQ - 2 Score 6 6 6   Altered sleeping 3 0 3  Tired, decreased energy 3 3 3   Change in appetite 3 3 3   Feeling bad or failure about yourself  2 3 3   Trouble concentrating 0 2 3  Moving slowly or fidgety/restless 0 0 0  Suicidal thoughts 0 0 0  PHQ-9 Score 17 17 21   Difficult doing work/chores Very difficult Very difficult Extremely dIfficult   GAD-7 Assessments:     08/31/2023    3:09 PM 08/03/2023    3:29 PM 05/30/2023    1:09 PM 01/11/2023    1:07 PM  GAD 7 : Generalized Anxiety Score  Nervous, Anxious, on Edge 2 2 2 1   Control/stop worrying 2 1 1 1   Worry too much - different things 2 2 1 1   Trouble relaxing 2 1 1  0  Restless 2 0 1 0  Easily annoyed or irritable 3 3 3 3   Afraid - awful might happen 0 0 0 0  Total GAD 7 Score 13 9 9 6   Anxiety Difficulty Very difficult  Somewhat difficult Somewhat difficult    Past Medical History Past Medical History:  Diagnosis Date   Allergy Don't remember   Don't remember   Anxiety    Arthritis In 2007   In both of my shoulders and my knees   Depression 2002   Depression    [employer]reesia 06/28/2020   Depression    [employer] 07/23/2020   Encounter for annual physical exam 12/03/2015   Fibromyalgia    GERD (gastroesophageal reflux disease) 01/23/2022   Heart murmur Don't remember   I was in 20s   Hypertension 2004   Medial meniscus tear 04/2013   left   Migraines     Vital signs: There were no vitals filed for this visit.  Allergies:  Allergies as of 09/06/2023 - Review Complete 09/05/2023  Allergen Reaction Noted   Lisinopril Cough 06/26/2018   Maxzide [hydrochlorothiazide w-triamterene] Itching 07/16/2019   Beano meltaways [alpha-d-galactosidase] Rash 09/23/2020   Buspar [buspirone] Rash 07/16/2019    Crestor [rosuvastatin calcium] Rash 05/01/2019   Hydrochlorothiazide Rash 01/23/2022   Paxil [paroxetine] Other (See Comments) 12/07/2020   Simethicone Rash 09/23/2020    Medication History Current medications:  Outpatient Encounter Medications as of 09/06/2023  Medication Sig   Alpha-D-Galactosidase (BEANO) TABS Take 1 tablet by mouth daily as needed (gas).   amLODipine (NORVASC) 10 MG tablet Take 1 tablet (10 mg total) by mouth daily. (Patient taking differently: Take 10 mg by mouth at bedtime.)   Aspirin-Acetaminophen (GOODYS BACK & BODY PAIN) 500-325 MG PACK Take 1 packet by mouth daily as needed (pain).   aspirin-acetaminophen-caffeine (EXCEDRIN MIGRAINE) 250-250-65 MG tablet Take 2 tablets by mouth every 6 (six) hours as needed for headache.   benzonatate (TESSALON) 100 MG capsule Take 1 capsule (100 mg total) by mouth 2 (two) times daily as needed for cough.   BLACK ELDERBERRY PO Take 1 capsule by mouth 2 (two) times daily.   diphenhydrAMINE (BENADRYL) 25 MG tablet Take 25 mg by mouth every 6 (six) hours as needed for itching.   DM-APAP-CPM (CORICIDIN HBP PO) Take 2 tablets by mouth  at bedtime as needed (congestion).   FIBER ADULT GUMMIES PO Take 1 capsule by mouth daily.   fluticasone (FLONASE) 50 MCG/ACT nasal spray Place 2 sprays into both nostrils daily.   gabapentin (NEURONTIN) 100 MG capsule TAKE 1 TO 2 CAPSULES BY MOUTH AT BEDTIME FOR PAIN   Iron-Vitamin C (VITRON-C PO) Take 1 tablet by mouth daily.   mirtazapine (REMERON) 7.5 MG tablet Take 1 tablet (7.5 mg total) by mouth at bedtime.   montelukast (SINGULAIR) 10 MG tablet Take 1 tablet (10 mg total) by mouth at bedtime.   Multiple Vitamin (MULTIVITAMIN WITH MINERALS) TABS tablet Take 1 tablet by mouth daily.   Multiple Vitamins-Minerals (HAIR SKIN AND NAILS FORMULA) TABS Take 2 tablets by mouth daily.   nebivolol (BYSTOLIC) 10 MG tablet Take 1 tablet (10 mg total) by mouth at bedtime.   Facility-Administered Encounter  Medications as of 09/06/2023  Medication   sodium chloride flush (NS) 0.9 % injection 3 mL     Scribe for Treatment Team: Reuel Boom

## 2023-09-21 ENCOUNTER — Ambulatory Visit: Payer: Medicare Other | Admitting: Professional Counselor

## 2023-09-26 ENCOUNTER — Other Ambulatory Visit: Payer: Self-pay | Admitting: Family Medicine

## 2023-09-28 ENCOUNTER — Ambulatory Visit: Admitting: Professional Counselor

## 2023-09-28 DIAGNOSIS — F332 Major depressive disorder, recurrent severe without psychotic features: Secondary | ICD-10-CM

## 2023-09-28 NOTE — BH Specialist Note (Signed)
 [facility] Virtual BH Telephone Follow-up  MRN: [id number] NAME: Shirley Bush Date: 09/28/23  Start time: Start Time: 0100 End time: Stop Time: 0130 Total time: Total Time in Minutes (Visit): 30 Call number: Visit Number: 3- Third Visit  Reason for call today:  The patient is a 68 year old female who presented for a collaborative care follow-up to discuss the psychiatric consultation and treatment recommendations. The collaborative care psychiatrist had recommended increasing her mirtazapine to 15 mg, starting Zoloft at 25 mg, and following up in 6-8 weeks. However, during the session, the patient reported that her depression and anxiety symptoms remained unchanged, with a PHQ-9 score of 18 and a GAD-7 score of 16. She also experienced adverse side effects from mirtazapine, making an increase inadvisable, and she did not start Zoloft, as she had tried it in the past and was highly sensitive to certain medications. Given her concerns, we discussed her preferences for treatment and explored the best approach moving forward. She has an upcoming psychiatry appointment in two weeks, which will allow for a face-to-face evaluation to determine the most suitable medication regimen. Additionally, she expressed interest in traditional therapy. Given these factors, we will transition her out of collaborative care and into traditional psychiatry and therapy services with [facility], as this seems to be the most appropriate course of action for her at this time. She appeared to be in agreement with this plan, and we will follow up in three weeks to ensure she successfully connects with the recommended services.  PHQ-9 Scores:     09/28/2023    1:09 PM 09/05/2023    9:42 AM 08/31/2023    3:08 PM 08/03/2023    3:21 PM 08/03/2023    2:39 PM  Depression screen PHQ 2/9  Decreased Interest 3 3 3 3  0  Down, Depressed, Hopeless 3 3 3 3  0  PHQ - 2 Score 6 6 6 6  0  Altered sleeping 3 3 0 3   Tired, decreased  energy 3 3 3 3    Change in appetite 3 3 3 3    Feeling bad or failure about yourself  3 2 3 3    Trouble concentrating 2 0 2 3   Moving slowly or fidgety/restless 2 0 0 0   Suicidal thoughts 0 0 0 0   PHQ-9 Score 22 17 17 21    Difficult doing work/chores Somewhat difficult Very difficult Very difficult Extremely dIfficult    GAD-7 Scores:     09/28/2023    1:11 PM 08/31/2023    3:09 PM 08/03/2023    3:29 PM 05/30/2023    1:09 PM  GAD 7 : Generalized Anxiety Score  Nervous, Anxious, on Edge 3 2 2 2   Control/stop worrying 3 2 1 1   Worry too much - different things 3 2 2 1   Trouble relaxing 2 2 1 1   Restless 2 2 0 1  Easily annoyed or irritable 2 3 3 3   Afraid - awful might happen 0 0 0 0  Total GAD 7 Score 15 13 9 9   Anxiety Difficulty Somewhat difficult Very difficult  Somewhat difficult       Current medications:  Outpatient Encounter Medications as of 09/28/2023  Medication Sig   Alpha-D-Galactosidase (BEANO) TABS Take 1 tablet by mouth daily as needed (gas).   amLODipine (NORVASC) 10 MG tablet Take 1 tablet (10 mg total) by mouth daily. (Patient taking differently: Take 10 mg by mouth at bedtime.)   Aspirin-Acetaminophen (GOODYS BACK & BODY PAIN) 500-325  MG PACK Take 1 packet by mouth daily as needed (pain).   aspirin-acetaminophen-caffeine (EXCEDRIN MIGRAINE) 250-250-65 MG tablet Take 2 tablets by mouth every 6 (six) hours as needed for headache.   benzonatate (TESSALON) 100 MG capsule Take 1 capsule (100 mg total) by mouth 2 (two) times daily as needed for cough.   BLACK ELDERBERRY PO Take 1 capsule by mouth 2 (two) times daily.   diphenhydrAMINE (BENADRYL) 25 MG tablet Take 25 mg by mouth every 6 (six) hours as needed for itching.   DM-APAP-CPM (CORICIDIN HBP PO) Take 2 tablets by mouth at bedtime as needed (congestion).   FIBER ADULT GUMMIES PO Take 1 capsule by mouth daily.   fluticasone (FLONASE) 50 MCG/ACT nasal spray Place 2 sprays into both nostrils daily.   gabapentin  (NEURONTIN) 100 MG capsule TAKE 1 TO 2 CAPSULES BY MOUTH AT BEDTIME FOR PAIN   Iron-Vitamin C (VITRON-C PO) Take 1 tablet by mouth daily.   mirtazapine (REMERON) 7.5 MG tablet Take 1 tablet (7.5 mg total) by mouth at bedtime.   montelukast (SINGULAIR) 10 MG tablet Take 1 tablet (10 mg total) by mouth at bedtime.   Multiple Vitamin (MULTIVITAMIN WITH MINERALS) TABS tablet Take 1 tablet by mouth daily.   Multiple Vitamins-Minerals (HAIR SKIN AND NAILS FORMULA) TABS Take 2 tablets by mouth daily.   nebivolol (BYSTOLIC) 10 MG tablet Take 1 tablet (10 mg total) by mouth at bedtime.   Facility-Administered Encounter Medications as of 09/28/2023  Medication   sodium chloride flush (NS) 0.9 % injection 3 mL    Substance Use Assessment Patient recently consumed alcohol:    Alcohol Use Disorder Identification Test (AUDIT):     07/26/2020   10:34 AM 07/29/2021    3:23 PM 08/04/2022    9:57 AM 08/07/2022    4:40 PM 01/10/2023   10:50 AM 09/05/2023    9:43 AM  Alcohol Use Disorder Test (AUDIT)  1. How often do you have a drink containing alcohol? 0 1 0 0 1 0  2. How many drinks containing alcohol do you have on a typical day when you are drinking? 0 0  0 0 0  3. How often do you have six or more drinks on one occasion? 0 0  0 0 0  AUDIT-C Score 0 1  0 1 0  Alcohol Brief Interventions/Follow-up Patient Refused        Reuel Boom

## 2023-09-28 NOTE — Patient Instructions (Signed)
Refer to psychiatry. 

## 2023-10-10 ENCOUNTER — Encounter (HOSPITAL_BASED_OUTPATIENT_CLINIC_OR_DEPARTMENT_OTHER): Payer: Medicare HMO | Admitting: Cardiovascular Disease

## 2023-10-11 ENCOUNTER — Telehealth (HOSPITAL_COMMUNITY): Payer: Self-pay

## 2023-10-11 NOTE — Telephone Encounter (Signed)
 Confirmed appt.

## 2023-10-11 NOTE — Telephone Encounter (Signed)
 called to confirm 10/15/23 appt no answer and then said call cannot be completed

## 2023-10-15 ENCOUNTER — Encounter (HOSPITAL_COMMUNITY): Payer: Self-pay | Admitting: Registered Nurse

## 2023-10-15 ENCOUNTER — Ambulatory Visit (HOSPITAL_COMMUNITY): Payer: Medicare Other | Admitting: Registered Nurse

## 2023-10-15 VITALS — BP 149/83 | HR 101 | Ht 63.0 in | Wt 152.6 lb

## 2023-10-15 DIAGNOSIS — G4700 Insomnia, unspecified: Secondary | ICD-10-CM

## 2023-10-15 DIAGNOSIS — F411 Generalized anxiety disorder: Secondary | ICD-10-CM

## 2023-10-15 DIAGNOSIS — F39 Unspecified mood [affective] disorder: Secondary | ICD-10-CM

## 2023-10-15 DIAGNOSIS — F331 Major depressive disorder, recurrent, moderate: Secondary | ICD-10-CM | POA: Diagnosis not present

## 2023-10-15 MED ORDER — QUETIAPINE FUMARATE 25 MG PO TABS: 25.0000 mg | ORAL_TABLET | Freq: Every day | ORAL | 0 refills | Status: DC

## 2023-10-15 MED ORDER — QUETIAPINE FUMARATE 50 MG PO TABS: 50.0000 mg | ORAL_TABLET | Freq: Every day | ORAL | 0 refills | Status: DC
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
## 2023-10-15 NOTE — Patient Instructions (Signed)
 Talk to your primary for a referral to gastrologist and dermatologist.    Call 911, 988, mobile crisis, or present to the nearest emergency room should you experience any suicidal/homicidal ideation, auditory/visual/hallucinations, or detrimental worsening of your mental health.  Mobile Crisis Response Teams Listed by counties in vicinity of [facility] providers [facility] [phone] [facility] [phone] [facility] [phone] [facility] [phone] [place]                * [facility] [phone]                * [facility] [phone]  [facility] [phone] [facility]  [phone] * [facility] [phone]      Here are a List of outpatient providers that accept Medicaid and private insurance.  They offer counseling/therapy virtually some also offer in person:    [facility] Phone: [phone] Physical Address:  [address] [place], [place]  [address]  Outpatient Services Life can be a challenge for [place] all. [facility] outpatient services offer a caring and experienced team of professionals who help people take the first step, which is often the most difficult. Together, we develop a well-defined and customized plan for each person that meets the individual's needs and goals. Each plan includes evidence-based practices as proven strategies that work. From board-certified psychiatrists, registered nurses, therapists, and outpatient office administrative professionals--all care and want to help you and your loved ones in every way possible to ensure you succeed.  Open Access:   One way we ensure we get people the help they need when they request is  is through Open Access. This service encourages individuals who are in dire need of our services and are new to [facility] to simply walk in or call us for virtual options, Monday through Friday between 8 a.m. and 3 p.m. On the same day of contact, if the individual has time to do so, he/she/they will complete patient registration and a comprehensive clinical assessment with a therapist. The assessment will provide treatment recommendations and the individual will leave with an appointment for the next service or a referral to the proper level of care.  While this process takes a few hours and is longer than a traditional appointment, it reduces what could otherwise be months of waiting for help or an appointment.   Telehealth Services:  [facility] telehealth services provide a safe, secure, and easy way to connect with a therapist or mental health provider for an individual or group therapy appointment. Click here to learn more about how [facility] telehealth services provide an important treatment option. These services may be accessed from the comfort of an individual's home, or at one of [facility]'s behavioral health offices such as this one where an individual may use on-site equipment for the visit.   Telehealth Services   A SAFE, SECURE, CONVENIENT TREATMENT OPTION:  [facility] telehealth services provide you with a safe, secure, and easy way to connect with your therapist or mental health provider for an individual or group therapy appointment.  Using Psychologist, prison and probation services, telehealth appointments allow you to meet with [employer], therapists, nurse practitioners, and psychiatrists from your desktop or laptop computer, cell phone, or tablet device. Telehealth visits are compliant with all Health Insurance Portability and Accountability Act (HIPAA) requirements and you can complete a telehealth visit from just about  anywhere using internet or wi-fi access.  HOW DOES IT WORK?  [facility] uses the  Doxy.me platform to host telehealth appointments. Prior to your scheduled visit, you will receive a direct link via text or email which will take you to your provider's online waiting room. Simply click that link at your appointment time and your provider will be notified that you've arrived. He or she will meet you online and you will complete your visit. Your provider may also have resources and information posted in his or her virtual waiting room which you may find helpful throughout your treatment.  In addition, you may receive a reminder telephone call from a [facility] team member in the days leading up to your appointment. During that call, you will have an opportunity to provide important health information and medication updates which may save time during your scheduled appointment.     WHO USES TELEHEALTH SERVICES?  Telehealth services provide an alternative to in-person, face-to-face treatment for individuals receiving outpatient behavioral health services. At [facility], telehealth visits may also be used by individuals receiving Assertive Community Treatment (ACT) Team and Individual Placement and Support (IPS) services and other community-based, specialized services as needed. Telehealth services are also used for group therapy sessions, allowing people we support to connect during treatment with others who have similar experiences.       Address:  [address] [place], [place] [address] Outpatient Hours: Mon-Fri 8AM to 5PM Phone: [phone] Fax: [phone] Offers: Behavioral Health Urgent Care [facility]) Hours: 24/7 Phone: [phone] Fax: [phone] --- Address:  [address] [place], [place] [address] Hours: Mon-Fri 8AM-5PM Phone: [phone] Fax: [phone]  Address: [address] [place], [place] [address] Hours: Outpatient: Mon-Fri 8AM to 5PM Behavioral Health Urgent Care [facility]) Hours: 24/7 Phone: [phone] Fax:  [phone]  Services: Adult Services Advanced Access Walk-In Assessments - All Disabilities If you're a walk-in patient there is generally a wait time involved on a "first come, first served basis" otherwise contact us beforehand to setup an appointment. If you're in crisis please use our 24-Hour Crisis Hotline for immediate access to a clinician. If this is your first time, please bring the following with you:   Insurance/Medicaid Card  ID or Social Security Card Any referral documentation Routine Outpatient Therapy Psychiatry/Med Management Substance Abuse Intensive Outpatient (SAIOP) Medication Assisted Treatment - MAT (Suboxone) Tailored Care Management (TCM) Youth Services Advanced Access Walk-In Assessments - All Disabilities Routine Outpatient Therapy Psychiatry/Med Management Tailored Care Management (TCM)   Behavioral Health Care in [place], [place] [facility]s [facility] in [place], [place] provides Integrated Behavioral Health Services to people of all ages. The Integrated Behavioral Health Program applies an evidence-based approach to integrate behavioral health into primary care. This model incorporates mental health treatment into a traditional medical visit. Our philosophical standard values comprehensive wellness for patients. The program is led by Dale Sinton, [facility] Director. Aimee is a Administrator, Civil Service (LCSW). Our organizational vision in implementing this program is to improve patient wellness by serving as a Publishing rights manager for thorough healthcare management. [facility] provides high-quality care for behavioral health patients of all ages, including: Common mental health diagnoses such as Anxiety, Depression, ADD/ADHD, Bipolar, and PTSD Substance Abuse Evaluations and Counseling NARCAN/[employer]al[employer] Distribution Our Behavioral Health Clinicians [facility]) are ready to help you  problem-solve through life stressors to promote healthy coping skills. Together we can identify how past challenges, such as traumatic events, are contributing  to how you're functioning today. Our BHCs are happy to incorporate the principles of your value system to nurture your healing needs. We offer HIPAA-compliant trauma-informed telehealth and in-person therapy to residents of [place] and [place] at our [place], [place], and [place], [place] medical centers. We look forward to meeting and working with you.  We'd love to hear from you!  OUR CONTACT INFO Address:  [address] [place], [place] [address] Hours Operation Monday 8:00AM - 5:00PM Tuesday 8:00AM - 7:00PM Wednesday 8:00AM - 5:00PM Thursday 8:00AM - 5:00PM Friday 8:00AM - 12:00PM  CALL us P: [phone] F: [phone] Email:  [email or web address]  Facebook/Messenger:  [email or web address] , [email or web address]   Hospital doctor:  [email or web address]   Get better, faster with quality mental health care Our providers take a hands-on approach to help you see improvement at every step, no matter how severe your symptoms. Just come as you are and let us take it from there.  Appointments in as little as 2 days Receive quick guidance and support by speaking with a licensed professional in a matter of hours.  Care for even the most severe cases We offer care for mild to severe depression, anxiety, and more --including Crisis Care for adults with elevated suicide risk.  Personalized plans unique to you Our treatment plans are tailored to your specific needs, providing you a clear path to success.  1:1 support from start to finish Get paired with a dedicated provider to serve as your single point of contact throughout treatment.  Results-based care, built for you Your expert provider will ensure your plan is grounded in data, lending support from beginning to end. Choose psychiatry, therapy, or  both.  Psychiatry When medication is necessary, our psychiatric providers get it right fast --analyzing 100+ data points to determine which treatment is likely to be most tolerable and effective for you.  Therapy Our virtual program combines cognitive and behavioral therapy with independent skill practice--all of which have been clinically proven to work for a wide range of symptoms so you can get better, and stay better.  Crisis Care A first-of-its-kind program for individuals with elevated suicide risk, Crisis Care is based on the Collaborative Assessment and Management of Suicidality (CAMS) framework--a care model that's backed by 30 years of research.  Teen Care (new) Give your teen a safe space to connect and get personal support. Our expert therapists help teens 13 and up with many common concerns--from school and peer pressure to anxiety and friendships. Psychiatry services available, if appropriate.  Virtual, dedicated support every step of the way 1:1 Video Sessions Let your provider know how you're feeling, get to know you, and provide 1:1 support.  Anytime Messaging Get questions or concerns off your chest between video visits by messaging your provider at any time.  Interactive Lessons Learn how to integrate new thought and behavior patterns into your daily life.  Proactive Progress Tracking Complete weekly check-ins so your provider can track your progress and, if necessary, adjust your treatment and/or medication.  1:1 Video Sessions Let your provider know how you're feeling, get to know you, and provide 1:1 support.   [facility] [employer], [place].  Address:  [address] [place], [place] [address]  Phone:  [phone] Website:  [email or web address] We are here to serve clients ages 61 - 62, who are challenged by a multitude of behavioral health concerns to include substance use disorders, and complex mental health scenarios where both  medical and psychiatric conditions coexist. Moreover,  as a trained [employer], I seek to provide incarnation al care. The aim of " [facility]" is to be incarnation al. Therefore, to provide a holistic treatment approach to all who call upon our services.  Behavioral Health Services & Treatments Depression  Substance Use Disorders  Mood Disorders  Psychodynamic Psychotherapy  [place] Charity fundraiser) Therapy Anxiety Disorders ADHD Urine Toxicology Screening Psychiatric Medication Management  Insurance [address], [address], [address], [address] and [place], [address] and [employer], [place], [place], [place], [employer], Control and instrumentation engineer, [employer], Cardinal Health (UMR), UnitedHealthcare UHC  UBH, [employer] of Network    Therapist, sports Health Counselor Associate, [place], [facility], CCTP Available both in-person and online [address] [place], [place] [address]  Phone:  [phone]  Specialties and Soil scientist Anxiety Depression Coping Skills Expertise ADHD Behavioral Issues Oppositional Def[place] (ODD) School Issues Self Esteem Stress Trauma and PTSD  How are you doing? Not what you tell others, Be honest with yourself How are you really doing?Marland Kitchen. Let's talk about what's really on your mind. I specialize in supporting individuals, children, and families as they navigate life's challenges and work toward healing. My background includes experience in crisis support as a first responder, which has deepened my understanding of how acute and long-term stress impacts mental health. I use a person-centered approach, ensuring that your unique strengths, experiences, and goals guide our work together. My therapeutic style is eclectic, drawing from evidence-based modalities such as Cognitive Behavioral Therapy (CBT), Trauma-Focused Therapy, mindfulness practices, and somatic techniques to create a personalized path toward wellness.   Shirley Bush Licensed Clinical Mental Health Counselor Associate, LCMHC-A, [facility] Available both in-person and online Location:  [place] - [address], [place], [place], [address] Phone: [phone] Website:  [email or web address] Hello! I'm Shirley Bush, LCMHC-A, NCC. I earned my Master's in Clinical Mental Health Counseling and a Certificate in Marriage and Family Counseling from [facility] [employer]. My experience includes roles as a Science writer for individuals on the Autism Spectrum. I gained most of my clinical experience at a children's center, collaborating with a multidisciplinary team to provide top-notch care. I'm passionate about working with children, adolescents, young adults, and families facing anxiety, depression, trauma, self-esteem issues, and relational challenges. I use play therapy and expressive art techniques when traditional talk therapy isn't effective. My approaches include cognitive-behavioral, solution-focused, trauma-focused therapies, and MATCH-ADTC (Modular Approach to Therapy for Children with Anxiety, Depression, Trauma, and Conduct problems). As your therapist, I'll tailor support to your needs and offer at-home activities to help you achieve your goals. My focus is on fostering self-awareness, growth, and confidence in a supportive environment of hope and empowerment. Outside work, I enjoy reading, trying new foods, and spending time with loved ones. Conditions Treated ADHD Anxiety/Phobias/Panic attacks Autism Bipolar disorder Childhood behavioral issues Couple's issues Depression Family Focus and concentration LGBTQ+ Obsessions or compulsions Postpartum or Peripartum Issues PTSD or Trauma Stress management   Shirley Bush Counselor, [facility], LCASA, NCC (she, her) Available online only Location:  [place], [place] [address] Phone:  [phone] My  clients can expect an environment free of judgment, full of understanding and empathy, where they can face the challenges that they have in a supportive environment. My personal background allows me to look at client issues from a different view and offer real world healing that meets the client where they are. My client's come from various backgrounds, but all seeking to better understand  themselves and a better way of coping. Because I'm seeing clients virtually, they feel freer to share more intimate and personal concerns that might be harder to talk about in person. Top Specialties LGBTQ+ Sex Therapy Expertise Addiction ADHD Anxiety Behavioral Issues Bipolar Disorder Body Positivity Borderline Personality (BPD) Cancer Child Chronic Pain Coping Skills Depression Life Transitions Marital and Premarital Obesity Open Relationships Non-Monogamy Parenting Peer Relationships Relationship Issues Self Esteem Sex-Positive, Kink Allied Sexual Abuse Sexual Addiction Stress Trauma and PTSD Weight Loss   [employer] Professional, MDiv, Med Available online only [address] [place], [place] [address] Phone:  [phone] Website:  [email or web address] Welcome! My name is Brewing technologist Scales, and I am passionate about helping individuals navigate life's challenges, develop resilience, and unlock their full potential. With over a decade of experience in counseling, ministry, and coaching, I provide a holistic approach to personal and spiritual development, drawing on my diverse professional background and extensive training. Administrator Spirituality Education and Learning Disabilities Mood Disorders Expertise ADHD Anxiety Pension scheme manager Counseling Child Coping Skills Depression Family Conflict First Responders Grief Intellectual Disability Life Coaching Life Transitions Marital and Premarital Parenting Peer  Relationships Relationship Issues School Issues Self Esteem Sexual Abuse Teen Violence Trauma and PTSD   Shirley Bush, MSW, LCSW, [facility] [address] [place], [place] [address] Office [phone] Fax [phone] Sometimes in life trying situations occur that may be difficult to handle alone. My goal is to help you successfully manage these challenges so that you can experience a more balanced and fulfilling life. I offer an eclectic approach to therapy, which includes solution-focused, reality-based, person-centered approaches. I will assist you in breaking the cycle of negative thinking and behaviors. If you are in need of support or guidance in order to make your life more manageable, I welcome the opportunity to assist you in doing so. My goal is to empower you to live a life of personal growth and positive well-being. Please call [phone] or email [email or web address] for an individual and family therapy or consultation today.   Contact Details  Locate [place] at [address], [place], [place] [address] [address] [place], [place] [address]  We are a full telehealth service company as we do not do in person visits.  Message or Call us at  Email:  [email or web address]   Phone: [phone]  FAX: [phone]  Who We Are [facility] is a leading provider of personalized mental health care in [place], [place], dedicated to fostering healing and growth in a safe, compassionate environment. Our team of experienced professionals offers innovative solutions tailored to meet the unique needs of each individual, helping them overcome challenges and improve their overall quality of life. We accept most major insurances.  You Are Our Top Priority  Mission Statement [facility] offers personalized mental health care in a safe and compassionate environment. Our team provides effective treatments and innovative solutions that  empower clients to overcome challenges and improve their quality of life.  Vision Statement [facility] aims to redefine mental health care standards through excellence, compassion, and innovation. We aim to make mental health care accessible and destigmatized, ensuring everyone receives the necessary support to thrive.  Services: Anxiety Depression Bipolar disorder Personality Disorder PTSD ADHD Schizophrenia Mood disorder Insomnia Neuropsychological Testing for ADHD and Other Disorders  Comprehensive neuropsychological testing for a better tomorrow Understanding Neuropsychological Testing Neuropsychological testing is a specialized evaluation method used to assess cognitive  functions, behaviors, and mental processing abilities. These tests are essential for diagnosing and managing conditions such as Attention-Deficit/Hyperactivity Disorder (ADHD), learning disabilities, autism spectrum disorders, mood disorders, and other neurological or psychological conditions. Unlike traditional assessments, neuropsychological testing provides objective, measurable data about an individual's cognitive abilities. This helps clinicians develop personalized treatment plans that address specific challenges and improve daily functioning.  Why Neuropsychological Testing is Important for ADHD ADHD is a complex neurodevelopmental disorder that affects attention, impulsivity, and executive function. Because its symptoms often overlap with other conditions, neuropsychological testing helps differentiate ADHD from other disorders, ensuring an accurate diagnosis and appropriate intervention. Through comprehensive testing, clinicians can evaluate:  Attention and Focus - Identifying difficulties in sustained and selective attention Executive Functioning - Assessing skills such as impulse control, problem-solving, and organization Memory and Processing Speed - Measuring how quickly and efficiently  information is understood and retained Behavioral and Emotional Regulation - Understanding mood-related symptoms that may be associated with ADHD Key Neuropsychological Tests Neuropsychological assessments typically include a combination of standardized tests that evaluate different cognitive domains. Some of the most commonly used tests include:  Continuous Performance Test (CPT) Measures sustained attention and response control. Helps identify inattention, impulsivity, and distractibility. Stroop Test Assesses cognitive flexibility and processing speed. Evaluates an individual's ability to control automatic responses and focus on specific tasks. [employer] Test (WCST) Measures executive function, problem-solving, and cognitive flexibility. Helps assess an individual's ability to adapt to changing rules and instructions. Trail Making Test (TMT) Assesses visual attention, processing speed, and task-switching abilities. Commonly used to detect cognitive impairments in ADHD and other disorders. N-Back Test Evaluates working memory and attentional capacity. Helps measure an individual's ability to hold and manipulate information over short periods.  Neuropsychological Testing for Other Disorders Beyond ADHD, neuropsychological testing is a crucial tool for diagnosing and managing a wide range of neurological and psychological conditions. These assessments can help identify cognitive deficits and guide targeted interventions for various disorders, including: Learning Disabilities Helps assess difficulties in reading, writing, or mathematics Identifies underlying cognitive challenges that may be affecting academic performance Autism Spectrum Disorder (ASD) Evaluates cognitive flexibility, attention, and executive functioning Helps determine the presence of social and communication deficits Mood Disorders (Depression, Anxiety, Bipolar Disorder) Identifies cognitive impairments  related to emotional regulation and stress response Assesses memory, attention, and problem-solving skills affected by mood disorders Traumatic Brain Injury (TBI) and Concussions Measures cognitive changes due to brain injuries Helps monitor recovery progress and guide rehabilitation efforts Dementia and Neurodegenerative Disorders Evaluates memory, processing speed, and executive function in conditions like Alzheimer's and Parkinson's disease Assists in early detection and progression monitoring  Benefits of Neuropsychological Testing Accurate Diagnosis - Differentiates ADHD from other conditions with similar symptoms, such as anxiety or learning disabilities Personalized Treatment Planning - Identifies cognitive strengths and weaknesses to tailor individualized intervention Tracking Progress - Enables clinicians to monitor changes over time and assess the effectiveness of treatments or medication Guiding Educational and Workplace Accommodations - Provides data to support school or workplace modifications for individuals struggling with cognitive challenges  Who Should Consider Neuropsychological Testing? Individuals experiencing difficulties with attention, memory, problem-solving, or emotional regulation may benefit from neuropsychological testing. This assessment is ideal for: Children and adults with suspected ADHD or learning disabilities Individuals with behavioral or emotional challenges affecting daily life Those recovering from neurological injuries or illnesses impacting cognitive function Anyone seeking a deeper understanding of their cognitive abilities to optimize performance in school, work, or daily activities  What to Expect  During a Neuropsychological Evaluation The testing process typically includes: Clinical Interview - Gathering medical and developmental history Standardized Testing - Completing various cognitive tasks on a computer or paper Behavioral Assessments -  Using rating scales and questionnaires completed by the individual and/or caregivers Comprehensive Report - A detailed breakdown of strengths, weaknesses, and personalized recommendations    Offers Virtual Therapy  [address] [place], [place] [address]  Phone:  [phone] Fax: [phone]  [facility] (OP) An Outpatient Treatment Program, or OP, is a service for individuals seeking support for substance abuse or mental health concerns who do not require frequent or intense support or safety monitoring. This level is appropriate for people with less severe disorders, or as a step-down from more intensive services. At [facility], OP is available for individuals aged 62 and above. It consists of basic treatment services offered in individual and group format and totals to less than 9 hours a week. Both virtual and in-person options for attending are available. OP is conducted by treatment professionals licensed to serve individuals with mental health and substance use disorders within [place]. OP helps individuals address a broad range of psychological and interpersonal challenges including: Substance Related Disorders Behavioral Addictions Anxiety Depression Trauma and Stressor Related Issues PTSD Parenting and Family Issues Relationship Issues Stress Self-Esteem Bipolar Disorders Life Transitions Personality Disorders ADHD Grief and Loss  *[facility] is proud to offer specialized treatment services on an outpatient level including, EMDR, Marriage/Family Counseling and [facility]  We currently accept all major insurance, including: Medicaid,Uninsured, [employer], [address], [place], [employer], [place], [employer], [employer], [employer], [employer] [facility] Phone:  [phone] Website:  [email or web address]           How to get  started with virtual therapy covered by Medicaid: Medicaid-covered members can call our Admissions Team 24/7 or fill out our online form to learn about their plan's specific benefits and get started with treatment. Once we verify your Medicaid benefits, our Clinical Team will conduct a thorough mental health assessment to create your personalized treatment plan. Medicaid members can get started with their personalized treatment plan (which includes curated groups, individual therapy, and family therapy) in as little as 24 hours. Call [phone]  What we Treat:  Anxiety Treatment for Teens and Adults  Our therapists specialize in cognitive behavioral therapy, a leading anxiety treatment, to provide evidence-based mental healthcare for teens and adults dealing with anxiety disorders. Fill out the short form below or call us directly to start healing from anxiety today with [facility].  Depression Treatment for Teens and Adults Depression affects millions of people worldwide, but healing is possible with evidence-based treatment.   Trauma Treatment for Teens and Adults After surviving trauma, building connections and receiving trauma-informed care are critical for long-lasting healing. That's why [facility] offers trauma-informed therapy in individual and group sessions.   Self-Harm Treatment for Teens and Adults Self-harm is often linked to serious mental health issues, which is why understanding the root of self-harm is key to long-lasting recovery.   Suicidal ideation Passive suicidal ideation, chronic suicidal ideation, previous suicide attempt  Substance Use Disorders Treatment for Teens and Adults:   Alcohol, marijuana, prescription drugs, opioids, amphetamines, cocaine, inhalants, hallucinogens, nicotine Understanding the mental health roots of substance use disorders (SUD) is key to long-lasting recovery.

## 2023-10-17 DIAGNOSIS — F39 Unspecified mood [affective] disorder: Secondary | ICD-10-CM | POA: Insufficient documentation

## 2023-10-18 ENCOUNTER — Telehealth: Payer: Self-pay | Admitting: Cardiovascular Disease

## 2023-10-18 NOTE — Telephone Encounter (Signed)
-----   Message from [place] [facility] sent at 10/09/2023  4:55 PM EDT ----- Regarding: Reschedule HTN Appt Patient is calling to reschedule her HTN appt with Dr. Duke Salvia

## 2023-10-18 NOTE — Telephone Encounter (Signed)
 Attempted to contact pt on 10/12/23 and 10/18/23 and message sent via mychart on 3/25.

## 2023-10-19 ENCOUNTER — Ambulatory Visit: Admitting: Professional Counselor

## 2023-10-29 ENCOUNTER — Encounter (HOSPITAL_COMMUNITY): Payer: Self-pay | Admitting: Registered Nurse

## 2023-10-29 ENCOUNTER — Telehealth (HOSPITAL_COMMUNITY): Admitting: Registered Nurse

## 2023-10-29 DIAGNOSIS — F39 Unspecified mood [affective] disorder: Secondary | ICD-10-CM | POA: Diagnosis not present

## 2023-10-29 DIAGNOSIS — F411 Generalized anxiety disorder: Secondary | ICD-10-CM

## 2023-10-29 DIAGNOSIS — F449 Dissociative and conversion disorder, unspecified: Secondary | ICD-10-CM

## 2023-10-29 DIAGNOSIS — G4700 Insomnia, unspecified: Secondary | ICD-10-CM | POA: Diagnosis not present

## 2023-10-29 MED ORDER — OLANZAPINE 5 MG PO TABS: 5.0000 mg | ORAL_TABLET | Freq: Every day | ORAL | 0 refills | Status: DC

## 2023-10-29 NOTE — Patient Instructions (Signed)
 Please have labs done prior to next visit  Call 911, 988, mobile crisis, or present to the nearest emergency room should you experience any suicidal/homicidal ideation, auditory/visual/hallucinations, or detrimental worsening of your mental health.  Mobile Crisis Response Teams Listed by counties in vicinity of [facility] providers [facility] [phone] [facility] [phone] [facility] [phone] [facility] [phone] [place]                * [facility] [phone]                * [facility] [phone]  [facility] [phone] [facility] [employer]  [phone] * [facility] [phone]
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
## 2023-10-30 DIAGNOSIS — I10 Essential (primary) hypertension: Secondary | ICD-10-CM | POA: Diagnosis not present

## 2023-10-30 DIAGNOSIS — E785 Hyperlipidemia, unspecified: Secondary | ICD-10-CM | POA: Diagnosis not present

## 2023-10-30 DIAGNOSIS — R7301 Impaired fasting glucose: Secondary | ICD-10-CM | POA: Diagnosis not present

## 2023-10-31 LAB — CMP14+EGFR
ALT: 17 IU/L (ref 0–32)
AST: 26 IU/L (ref 0–40)
Albumin: 4.5 g/dL (ref 3.9–4.9)
Alkaline Phosphatase: 77 IU/L (ref 44–121)
BUN/Creatinine Ratio: 6 — ABNORMAL LOW (ref 12–28)
BUN: 5 mg/dL — ABNORMAL LOW (ref 8–27)
Bilirubin Total: 0.3 mg/dL (ref 0.0–1.2)
CO2: 22 mmol/L (ref 20–29)
Calcium: 9.6 mg/dL (ref 8.7–10.3)
Chloride: 104 mmol/L (ref 96–106)
Creatinine, Ser: 0.89 mg/dL (ref 0.57–1.00)
Globulin, Total: 2.5 g/dL (ref 1.5–4.5)
Glucose: 93 mg/dL (ref 70–99)
Potassium: 4 mmol/L (ref 3.5–5.2)
Sodium: 139 mmol/L (ref 134–144)
Total Protein: 7 g/dL (ref 6.0–8.5)
eGFR: 72 mL/min/{1.73_m2} (ref >59)

## 2023-10-31 LAB — HEMOGLOBIN A1C
Est. average glucose Bld gHb Est-mCnc: 117 mg/dL
Hgb A1c MFr Bld: 5.7 % — ABNORMAL HIGH (ref 4.8–5.6)

## 2023-10-31 LAB — LIPID PANEL
Chol/HDL Ratio: 4.7 ratio — ABNORMAL HIGH (ref 0.0–4.4)
Cholesterol, Total: 233 mg/dL — ABNORMAL HIGH (ref 100–199)
HDL: 50 mg/dL (ref >39)
LDL Chol Calc (NIH): 158 mg/dL — ABNORMAL HIGH (ref 0–99)
Triglycerides: 138 mg/dL (ref 0–149)
VLDL Cholesterol Cal: 25 mg/dL (ref 5–40)

## 2023-11-09 ENCOUNTER — Ambulatory Visit: Payer: Medicare Other | Admitting: Family Medicine

## 2023-11-19 ENCOUNTER — Encounter (HOSPITAL_COMMUNITY): Payer: Self-pay | Admitting: Registered Nurse

## 2023-11-19 ENCOUNTER — Ambulatory Visit (HOSPITAL_COMMUNITY): Admitting: Registered Nurse

## 2023-11-19 VITALS — BP 141/77 | HR 77 | Ht 63.0 in | Wt 151.2 lb

## 2023-11-19 DIAGNOSIS — F449 Dissociative and conversion disorder, unspecified: Secondary | ICD-10-CM

## 2023-11-19 DIAGNOSIS — F39 Unspecified mood [affective] disorder: Secondary | ICD-10-CM

## 2023-11-19 MED ORDER — OLANZAPINE 5 MG PO TABS: 5.0000 mg | ORAL_TABLET | Freq: Every day | ORAL | 1 refills | Status: DC

## 2023-11-19 NOTE — Patient Instructions (Signed)
 Call 911, [phone], mobile crisis, or present to the nearest emergency room should you experience any suicidal/homicidal ideation, auditory/visual/hallucinations, or detrimental worsening of your mental health.  Mobile Crisis Response Teams Listed by counties in vicinity of [facility] providers [facility] [phone] [facility] [phone] Sarah Bush [facility] [phone] [facility] [phone] [place]                * [facility] [phone]                * [facility] [phone]  [facility] [phone] [facility]  [phone] * [facility] [phone]       The GeneSight Psychotropic test analyzes how your genes may affect your outcomes with medications commonly prescribed to treat depression, anxiety, ADHD, and other mental health conditions. The GeneSight Psychotropic test provides your clinician with information about which medications may require dose adjustments, may be less likely to work for you or may have an increased risk of side effects based on your genetic makeup.     Understanding the GeneSight test algorithm The GeneSight test uses a unique combinatorial algorithm, which evaluates how variations in multiple genes may influence an individual's outcomes with certain medications. The algorithm provides important information regarding how an individual may metabolize and/or respond to certain medications commonly prescribed to treat depression, anxiety, ADHD and other psychiatric conditions. This information supplements a healthcare provider's comprehensive medical evaluation to help personalize treatment plans. The GeneSight Psychotropic test has been evaluated in multiple published, peer-reviewed clinical studies. It is  the only neuropsychiatric pharmacogenomic test backed by such extensive research.  The role of genetics in predicting outcomes with medications used for psychiatric conditions Understanding the combinatorial approach of the GeneSight test starts with understanding our basic genetics: Each individual has a unique collection of genes responsible for their various genetic traits. The GeneSight test analyzes genetic variation found in 14 total genes that may impact a patient's outcomes with certain medications. This includes 9 pharmacokinetic genes and 5 pharmacodynamic genes: Pharmacokinetic (PK) genes are involved in how the body metabolizes, or breaks down, a particular medication through specific drug metabolizing enzymes. Pharmacodynamic (PD) genes provide information on how your DNA may impact your likelihood of response or risk for side effects with some medications. The GeneSight test takes into consideration all clinically relevant genes known to affect metabolism or response to a medication and assigns weights based on how big (or small) of a role it is expected to play in the medication's overall metabolism or mechanism of action.  The data that informs the algorithm Evidence used to build the algorithm comes from comprehensive literature and data reviews. Sources include published scientific literature (collected from PubMed, Embase, Radio broadcast assistant, etc.), approved regulatory drug labels, and review applications (FDA). Animal-derived, preclinical data is not included - only human-based data. The data can be separated into three categories: FDA-reviewed pharmacokinetic (PK) and pharmacodynamic (PD) documents submitted as part of a new drug application (NDA). in-vitro (test tube) and in-vivo (human) data assessing medication pathways that have been published in peer-reviewed journals. This identifies the genes involved in the metabolism and/or the mechanism of action of a medication. in-vitro and  in-vivo data evaluating genetic variations and applying expected activity levels (i.e., phenotype expression based on genetic variation) that have either been published in peer-reviewed journals or derived from internal lab data. This identifies to what extent the genetic  variation is expected to impact the metabolism and/or the mechanism of action of a medication. The quality of the data is scrutinized, with higher quality evidence having a greater impact in the algorithm. For example, in-vivo data is held in higher regard than in-vitro data. Additionally, data assessed from evidence-grading bodies such as the Pharmacogenomics Knowledgebase (PharmGKB), the Clinical Pharmacogenetics Implementation Consortium Recruitment consultant) and the [place] Pharmacogenetic Working Group [facility]) are also evaluated for inclusion. This ensures that all sources of clinically actionable gene-drug interactions [facility]) and genotype-phenotype relationships have been considered. It is important to note that our evaluation of the data may lead to different interpretations than what is concluded by individual expert PGx groups, evidence grading bodies, or other PGx companies. The field of pharmacogenomics is constantly evolving. As the evidence surrounding the impact of established and newly identified genetic variants on medication metabolism and response evolves, so too will the algorithm. The [facility] team continually reviews available data to determine if and how it impacts the GeneSight proprietary algorithm. A full review of the GeneSight test is completed approximately every 18 months to 2 years.    For more information visit the GeneSight website:  https://genesight.com
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
## 2023-11-20 ENCOUNTER — Telehealth (HOSPITAL_COMMUNITY): Payer: Self-pay

## 2023-11-20 NOTE — Telephone Encounter (Signed)
 TMS Coordinator placed another call to pt. Telephone call "could not be completed as dialed." VM unable to be left.

## 2023-11-20 NOTE — Telephone Encounter (Signed)
 TMS Coordinator placed call to pt to assess eligibility for tx. A female voice answered incoherently and then hung up. [employer] Coordinator called back and call "could not be completed as dialed." Voicemail was unable to be left. Coordinator will attempt to reach out again before EOD.

## 2023-11-23 ENCOUNTER — Encounter: Payer: Self-pay | Admitting: Family Medicine

## 2023-11-23 ENCOUNTER — Ambulatory Visit (INDEPENDENT_AMBULATORY_CARE_PROVIDER_SITE_OTHER): Admitting: Family Medicine

## 2023-11-23 VITALS — BP 142/84 | HR 76 | Resp 16 | Ht 63.0 in | Wt 151.0 lb

## 2023-11-23 DIAGNOSIS — E785 Hyperlipidemia, unspecified: Secondary | ICD-10-CM | POA: Diagnosis not present

## 2023-11-23 DIAGNOSIS — I10 Essential (primary) hypertension: Secondary | ICD-10-CM

## 2023-11-23 DIAGNOSIS — J0100 Acute maxillary sinusitis, unspecified: Secondary | ICD-10-CM | POA: Diagnosis not present

## 2023-11-23 DIAGNOSIS — J0110 Acute frontal sinusitis, unspecified: Secondary | ICD-10-CM | POA: Diagnosis not present

## 2023-11-23 DIAGNOSIS — F332 Major depressive disorder, recurrent severe without psychotic features: Secondary | ICD-10-CM

## 2023-11-23 DIAGNOSIS — R7301 Impaired fasting glucose: Secondary | ICD-10-CM | POA: Diagnosis not present

## 2023-11-23 MED ORDER — PENICILLIN V POTASSIUM 500 MG PO TABS: 500.0000 mg | ORAL_TABLET | Freq: Three times a day (TID) | ORAL | 0 refills | Status: DC

## 2023-11-23 NOTE — Patient Instructions (Addendum)
 F/U in 4 months, call if you n eed me sooner  Penicillin  is prescribed for sinus infection for 1 week  Work on reducing fatty foods and oils and cheese, you cannot get medication I had discussed due to allergy  Continue to work on exercise , eating mainly vegetables AND FRUIT AND REDUCING PROCESSED FOODS TO HELP BLOOD PRESSURE , NEW MEDICATION MENTONED CANNOT BE TAKEN  BECAUSE OF ALLERGY  FASTING LIPID, CMP AND egfR, HBA1C, CBC, TSH 3 TO 7 DAYS BEFORE 4 MONTH F/U APPT  Thanks for choosing  Primary Care, we consider it a privelige to serve you.

## 2023-11-29 ENCOUNTER — Encounter: Payer: Self-pay | Admitting: Family Medicine

## 2023-11-29 ENCOUNTER — Ambulatory Visit (INDEPENDENT_AMBULATORY_CARE_PROVIDER_SITE_OTHER): Admitting: Family Medicine

## 2023-11-29 VITALS — BP 160/84 | HR 73 | Resp 18 | Ht 63.0 in | Wt 153.1 lb

## 2023-11-29 DIAGNOSIS — I10 Essential (primary) hypertension: Secondary | ICD-10-CM | POA: Diagnosis not present

## 2023-11-29 DIAGNOSIS — F332 Major depressive disorder, recurrent severe without psychotic features: Secondary | ICD-10-CM

## 2023-11-29 DIAGNOSIS — F411 Generalized anxiety disorder: Secondary | ICD-10-CM | POA: Diagnosis not present

## 2023-11-29 DIAGNOSIS — Z889 Allergy status to unspecified drugs, medicaments and biological substances status: Secondary | ICD-10-CM | POA: Diagnosis not present

## 2023-11-29 NOTE — Patient Instructions (Addendum)
 F/U as before, call if you need me sooner  You are  referred to an Allergist due to stated concerns  Avoid foods that you feel may be a problem in the interim  Thanks for choosing [facility], we consider it a privelige to serve you.

## 2023-12-01 ENCOUNTER — Other Ambulatory Visit (HOSPITAL_BASED_OUTPATIENT_CLINIC_OR_DEPARTMENT_OTHER): Payer: Self-pay | Admitting: Family

## 2023-12-01 DIAGNOSIS — I10 Essential (primary) hypertension: Secondary | ICD-10-CM

## 2023-12-03 ENCOUNTER — Encounter (HOSPITAL_COMMUNITY): Payer: Self-pay | Admitting: Registered Nurse

## 2023-12-03 ENCOUNTER — Ambulatory Visit (HOSPITAL_COMMUNITY): Admitting: Registered Nurse

## 2023-12-03 VITALS — BP 114/73 | HR 71 | Ht 63.0 in | Wt 150.6 lb

## 2023-12-03 DIAGNOSIS — F449 Dissociative and conversion disorder, unspecified: Secondary | ICD-10-CM

## 2023-12-03 DIAGNOSIS — F39 Unspecified mood [affective] disorder: Secondary | ICD-10-CM | POA: Diagnosis not present

## 2023-12-03 DIAGNOSIS — F411 Generalized anxiety disorder: Secondary | ICD-10-CM

## 2023-12-03 MED ORDER — OLANZAPINE 5 MG PO TABS: 5.0000 mg | ORAL_TABLET | Freq: Every day | ORAL | 3 refills | Status: DC
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
## 2023-12-03 NOTE — Patient Instructions (Signed)

## 2023-12-04 ENCOUNTER — Encounter: Payer: Self-pay | Admitting: Family Medicine

## 2023-12-04 DIAGNOSIS — Z889 Allergy status to unspecified drugs, medicaments and biological substances status: Secondary | ICD-10-CM | POA: Insufficient documentation

## 2023-12-04 NOTE — Assessment & Plan Note (Signed)
 Concerned that she is gluten sensitive and that foods are contributing to allergic reactions , as well as a significant number of meds, refer Allergist for formal eval

## 2023-12-04 NOTE — Assessment & Plan Note (Signed)
 Being treated by Psych and reports improvement

## 2023-12-04 NOTE — Progress Notes (Signed)
   Shirley Bush     MRN: [id number]      DOB: 29-Aug-1955  Chief Complaint  Patient presents with   Alopecia    Pt would like a referral for dermatologist or allergist to discuss hair loss and bumps on scalp that get worse when eating gluten/wheat     HPI Shirley Bush is herewith main concern as above, she has been evaluated by Dermatology for hair loss, believes she may be sensitive to gluten , and reports multiple allergies listed 8 on record, and feels that certain foods cause a reaction also  ROS Denies recent fever or chills. Denies sinus pressure, nasal congestion, ear pain or sore throat. Denies chest congestion, productive cough or wheezing. .  C/o  depression, anxiety feels that current medication is working being treated by Psych C/o hair loss  PE  BP (!) 160/84   Pulse 73   Resp 18   Ht 5\' 3"  (1.6 m)   Wt 153 lb 1.9 oz (69.5 kg)   SpO2 98%   BMI 27.12 kg/m   Patient alert and oriented and in no cardiopulmonary distress.  HEENT: No facial asymmetry, EOMI,     Neck supple .  Chest: Clear to auscultation bilaterally.  CVS: S1, S2 no murmurs, no S3.Regular rate.  Ext: No edema  MS: Adequate ROM spine, shoulders, hips and knees.  Skin: Intact, no ulcerations or rash noted.;patchy hair loss noted  Psych: Good eye contact, normal affect.    Assessment & Plan  Multiple allergies Concerned that she is gluten sensitive and that foods are contributing to allergic reactions , as well as a significant number of meds, refer Allergist for formal eval  Essential hypertension Improved tough still not at goal, needs to continnue with [facility] DASH diet and commitment to daily physical activity for a minimum of 30 minutes discussed and encouraged, as a part of hypertension management. The importance of attaining a healthy weight is also discussed.     12/03/2023    1:50 PM 11/29/2023    4:28 PM 11/29/2023    4:00 PM 11/23/2023    3:46 PM 11/23/2023    3:00 PM 11/23/2023     2:58 PM 11/19/2023   11:41 AM  BP/Weight  Systolic BP  160 324 142 142 146   Diastolic BP  84 82 84 82 77   Wt. (Lbs)   153.12   151.04   BMI   27.12 kg/m2   26.76 kg/m2      Information is confidential and restricted. Go to Review Flowsheets to unlock data.       GAD (generalized anxiety disorder) Being treated by Psych and reports improvement  Depression, major, severe recurrence (HCC) Reports improvement on current med , managed by Psych

## 2023-12-04 NOTE — Assessment & Plan Note (Signed)
 Reports improvement on current med , managed by Psych

## 2023-12-04 NOTE — Assessment & Plan Note (Signed)
 Improved tough still not at goal, needs to continnue with [facility] DASH diet and commitment to daily physical activity for a minimum of 30 minutes discussed and encouraged, as a part of hypertension management. The importance of attaining a healthy weight is also discussed.     12/03/2023    1:50 PM 11/29/2023    4:28 PM 11/29/2023    4:00 PM 11/23/2023    3:46 PM 11/23/2023    3:00 PM 11/23/2023    2:58 PM 11/19/2023   11:41 AM  BP/Weight  Systolic BP  160 295 142 142 146   Diastolic BP  84 82 84 82 77   Wt. (Lbs)   153.12   151.04   BMI   27.12 kg/m2   26.76 kg/m2      Information is confidential and restricted. Go to Review Flowsheets to unlock data.

## 2023-12-06 ENCOUNTER — Ambulatory Visit (HOSPITAL_COMMUNITY): Admitting: Registered Nurse

## 2023-12-17 ENCOUNTER — Encounter: Payer: Self-pay | Admitting: Family Medicine

## 2023-12-17 DIAGNOSIS — J019 Acute sinusitis, unspecified: Secondary | ICD-10-CM | POA: Insufficient documentation

## 2023-12-17 NOTE — Assessment & Plan Note (Signed)
Pen V x 1 week prescribed

## 2023-12-17 NOTE — Assessment & Plan Note (Signed)
 Being treated by Psych, tolerant of new med and states she " feels it is helping her" which is good, score not reflecting this yet , but  I am hopeful improvement will happen

## 2023-12-17 NOTE — Assessment & Plan Note (Signed)
 Improved, continue management through [facility] DASH diet and commitment to daily physical activity for a minimum of 30 minutes discussed and encouraged, as a part of hypertension management. The importance of attaining a healthy weight is also discussed.     12/03/2023    1:50 PM 11/29/2023    4:28 PM 11/29/2023    4:00 PM 11/23/2023    3:46 PM 11/23/2023    3:00 PM 11/23/2023    2:58 PM 11/19/2023   11:41 AM  BP/Weight  Systolic BP  160 161 142 142 146   Diastolic BP  84 82 84 82 77   Wt. (Lbs)   153.12   151.04   BMI   27.12 kg/m2   26.76 kg/m2      Information is confidential and restricted. Go to Review Flowsheets to unlock data.

## 2023-12-17 NOTE — Assessment & Plan Note (Signed)
 Hyperlipidemia:Low fat diet discussed and encouraged.   Lipid Panel  Lab Results  Component Value Date   CHOL 233 (H) 10/30/2023   HDL 50 10/30/2023   LDLCALC 158 (H) 10/30/2023   TRIG 138 10/30/2023   CHOLHDL 4.7 (H) 10/30/2023     Needs to lower fat intake

## 2023-12-17 NOTE — Progress Notes (Signed)
 Shirley Bush     MRN: [id number]      DOB: 09/17/1955  Chief Complaint  Patient presents with   Medical Management of Chronic Issues    14 week follow up    Cough    Complains of cough, congestions, sinus congestion x2 weeks. Has tried otc allergy medication but no improvement     HPI [name] is here for follow up and re-evaluation of chronic medical conditions, medication management and review of any available recent lab and radiology data.  Preventive health is updated, specifically  Cancer screening and Immunization.   Questions or concerns regarding consultations or procedures which the PT has had in the interim are  addressed. The PT denies any adverse reactions to current medications since the last visit.  1 week h/o sinus pressure and drainage with cough ROS Denies recent fever or chills. C/o  sinus pressure, nasal congestion, denies ear pain or sore throat. Denies chest congestion, productive cough or wheezing. Denies chest pains, palpitations and leg swelling Denies abdominal pain, nausea, vomiting,diarrhea or constipation.   Denies dysuria, frequency, hesitancy or incontinence. Denies joint pain, swelling and limitation in mobility. Denies headaches, seizures, numbness, or tingling. C/o depression, anxiety feels as though medication she is on is helping her and she is being treated by Psych, not suicidal or homicidal. Denies skin break down or rash.   PE  BP (!) 142/84   Pulse 76   Resp 16   Ht 5\' 3"  (1.6 m)   Wt 151 lb 0.6 oz (68.5 kg)   SpO2 97%   BMI 26.76 kg/m   Patient alert and oriented and in no cardiopulmonary distress.  HEENT: No facial asymmetry, EOMI,     Neck supple .frontal sinus tender and positve nasal congestiuon  Chest: Clear to auscultation bilaterally.  CVS: S1, S2 no murmurs, no S3.Regular rate.  ABD: Soft non tender.   Ext: No edema  MS: Adequate ROM spine, shoulders, hips and knees.  Skin: Intact, no ulcerations or rash  noted.  Psych: Good eye contact, normal affect. Memory intact not anxious or depressed appearing.  CNS: CN 2-12 intact, power,  normal throughout.no focal deficits noted.   Assessment & Plan  Maxillary sinusitis, acute Pen V x 1 week prescribed  Essential hypertension Improved, continue management through [facility] DASH diet and commitment to daily physical activity for a minimum of 30 minutes discussed and encouraged, as a part of hypertension management. The importance of attaining a healthy weight is also discussed.     12/03/2023    1:50 PM 11/29/2023    4:28 PM 11/29/2023    4:00 PM 11/23/2023    3:46 PM 11/23/2023    3:00 PM 11/23/2023    2:58 PM 11/19/2023   11:41 AM  BP/Weight  Systolic BP  160 782 142 142 146   Diastolic BP  84 82 84 82 77   Wt. (Lbs)   153.12   151.04   BMI   27.12 kg/m2   26.76 kg/m2      Information is confidential and restricted. Go to Review Flowsheets to unlock data.       Hyperlipidemia Hyperlipidemia:Low fat diet discussed and encouraged.   Lipid Panel  Lab Results  Component Value Date   CHOL 233 (H) 10/30/2023   HDL 50 10/30/2023   LDLCALC 158 (H) 10/30/2023   TRIG 138 10/30/2023   CHOLHDL 4.7 (H) 10/30/2023     Needs to lower fat intake   Depression, major,  severe recurrence (HCC) Being treated by Psych, tolerant of new med and states she " feels it is helping her" which is good, score not reflecting this yet , but  I am hopeful improvement will happen

## 2023-12-17 NOTE — Assessment & Plan Note (Signed)
 Patient educated about the importance of limiting  Carbohydrate intake , the need to commit to daily physical activity for a minimum of 30 minutes , and to commit weight loss. The fact that changes in all these areas will reduce or eliminate all together the development of diabetes is stressed.      Latest Ref Rng & Units 10/30/2023    8:04 AM 06/01/2023    8:12 AM 10/30/2022    8:15 AM 02/03/2022    9:09 AM 01/30/2022    8:27 AM  Diabetic Labs  HbA1c 4.8 - 5.6 % 5.7  5.9    5.9   Chol 100 - 199 mg/dL 409  811    914   HDL >78 mg/dL 50  54    61   Calc LDL 0 - 99 mg/dL 295  621    308   Triglycerides 0 - 149 mg/dL 657  846    85   Creatinine 0.57 - 1.00 mg/dL 9.62  9.52  8.41  3.24  0.91       12/03/2023    1:50 PM 11/29/2023    4:28 PM 11/29/2023    4:00 PM 11/23/2023    3:46 PM 11/23/2023    3:00 PM 11/23/2023    2:58 PM 11/19/2023   11:41 AM  BP/Weight  Systolic BP  160 401 142 142 146   Diastolic BP  84 82 84 82 77   Wt. (Lbs)   153.12   151.04   BMI   27.12 kg/m2   26.76 kg/m2      Information is confidential and restricted. Go to Review Flowsheets to unlock data.       No data to display          Updated lab needed at/ before next visit.

## 2023-12-20 ENCOUNTER — Ambulatory Visit (INDEPENDENT_AMBULATORY_CARE_PROVIDER_SITE_OTHER): Admitting: Psychiatry

## 2023-12-20 ENCOUNTER — Encounter (INDEPENDENT_AMBULATORY_CARE_PROVIDER_SITE_OTHER): Payer: Self-pay | Admitting: *Deleted

## 2023-12-20 ENCOUNTER — Telehealth: Payer: Self-pay | Admitting: Family Medicine

## 2023-12-20 DIAGNOSIS — Z800 Family history of malignant neoplasm of digestive organs: Secondary | ICD-10-CM

## 2023-12-20 DIAGNOSIS — F332 Major depressive disorder, recurrent severe without psychotic features: Secondary | ICD-10-CM | POA: Diagnosis not present

## 2023-12-20 DIAGNOSIS — F411 Generalized anxiety disorder: Secondary | ICD-10-CM | POA: Diagnosis not present

## 2023-12-20 DIAGNOSIS — D126 Benign neoplasm of colon, unspecified: Secondary | ICD-10-CM

## 2023-12-20 NOTE — Telephone Encounter (Signed)
 Please advise    Copied from CRM [phone]. Topic: Referral - Question >> Dec 20, 2023  4:07 PM DeAngela L wrote: Reason for CRM: Patient calling because she got a MyChat reminder about a colonoscopy and her next is due 01/14/24, after calling to schedule the appt the office informed her she needs a referral from her provider, she would like her Dr to refer in [place] [facility] her to for a colonoscopy  Pt number [phone] (M)
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
## 2023-12-21 NOTE — Telephone Encounter (Signed)
 Referral entered , record states rept colonoscopy in 5 yrs from 2020 per GI

## 2023-12-31 ENCOUNTER — Ambulatory Visit (HOSPITAL_COMMUNITY): Admitting: Registered Nurse

## 2024-01-10 ENCOUNTER — Ambulatory Visit: Admitting: Internal Medicine

## 2024-01-10 ENCOUNTER — Encounter: Payer: Self-pay | Admitting: *Deleted

## 2024-01-10 ENCOUNTER — Other Ambulatory Visit: Payer: Self-pay | Admitting: *Deleted

## 2024-01-10 ENCOUNTER — Encounter: Payer: Self-pay | Admitting: Internal Medicine

## 2024-01-10 VITALS — BP 113/73 | HR 82 | Temp 98.6°F | Ht 63.0 in | Wt 151.8 lb

## 2024-01-10 DIAGNOSIS — K5904 Chronic idiopathic constipation: Secondary | ICD-10-CM

## 2024-01-10 DIAGNOSIS — R140 Abdominal distension (gaseous): Secondary | ICD-10-CM | POA: Diagnosis not present

## 2024-01-10 DIAGNOSIS — K5909 Other constipation: Secondary | ICD-10-CM

## 2024-01-10 DIAGNOSIS — Z800 Family history of malignant neoplasm of digestive organs: Secondary | ICD-10-CM | POA: Diagnosis not present

## 2024-01-10 DIAGNOSIS — Z860101 Personal history of adenomatous and serrated colon polyps: Secondary | ICD-10-CM | POA: Diagnosis not present

## 2024-01-10 MED ORDER — PEG 3350-KCL-NA BICARB-NACL 420 G PO SOLR: 4000.0000 mL | Freq: Once | ORAL | 0 refills | Status: AC

## 2024-01-10 NOTE — Progress Notes (Signed)
 Primary Care Physician:  Shirley Rollene BRAVO, MD Primary Gastroenterologist:  Dr. Cindie  Chief Complaint  Patient presents with   New Patient (Initial Visit)    Referred for colonoscopy. Constipation  and gas    HPI:   Shirley Bush is a 68 y.o. female who presents to the clinic today by referral from her PCP Dr. Antonetta for evaluation.  History of adenomatous colon polyps: Last colonoscopy 01/14/2019 with 1 small tubular adenoma removed from her ascending colon.  Also notes family history of colon cancer in her sister age less than 75.  Denies any melena hematochezia.  No unintentional weight loss.  No abdominal pain.  Chronic constipation: Mild, currently maintained on diet modification only.  Previously took MiraLAX with which caused to frequent BMs.  Has tried to incorporate more fiber in her diet with fruits on a daily basis.  Abdominal bloating: Previous celiac testing negative, alpha gal testing negative, ESR/CRP WNL.  Currently using activated charcoal which is keeping her symptoms under good control.  Previously on Lactaid prior to dairy products though she does not appear to be doing this currently.  Past Medical History:  Diagnosis Date   Allergy Don't remember   Don't remember   Anxiety    Arthritis In 2007   In both of my shoulders and my knees   Depression 2002   Depression    Phreesia 06/28/2020   Depression    Phreesia 07/23/2020   Encounter for annual physical exam 12/03/2015   Fibromyalgia    GERD (gastroesophageal reflux disease) 01/23/2022   Heart murmur Don't remember   I was in 20s   Hypertension 2004   Medial meniscus tear 04/2013   left   Migraines     Past Surgical History:  Procedure Laterality Date   CESAREAN SECTION  1978   CESAREAN SECTION  1980   CESAREAN SECTION  1992   CESAREAN SECTION N/A    Phreesia 06/28/2020   COLONOSCOPY  02/05/2009   COLONOSCOPY N/A 03/06/2014   Procedure: COLONOSCOPY;  Surgeon: Shirley Bush Haddock, MD;   Location: [facility];  Service: Endoscopy;  Laterality: N/A;  10:30 AM   COLONOSCOPY WITH PROPOFOL  N/A 01/14/2019   Surgeon: Bush Shirley LITTIE, MD;  one 3 mm tubular adenoma in the mid ascending colon, moderate diverticulosis in entire colon, external and internal hemorrhoids, tortuous left colon. Recommended repeat in 5 years.    FINGER SURGERY Right    long finger   HERNIA REPAIR N/A    Phreesia 06/28/2020   JOINT REPLACEMENT N/A    Phreesia 06/28/2020   KNEE ARTHROSCOPY WITH MEDIAL MENISECTOMY Left 04/24/2013   Procedure: LEFT KNEE ARTHROSCOPY CHONDROPLASTY WITH MEDIAL MENISECTOMY;  Surgeon: Shirley JULIANNA Chancy, MD;  Location: [facility];  Service: Orthopedics;  Laterality: Left;   RENAL DENERVATION N/A 11/08/2022   Procedure: RENAL DENERVATION;  Surgeon: Shirley Deatrice LABOR, MD;  Location: [facility];  Service: Cardiovascular;  Laterality: N/A;   SHOULDER ARTHROSCOPY Left 2009   SHOULDER ARTHROSCOPY Right    SHOULDER ARTHROSCOPY WITH ROTATOR CUFF REPAIR Left 11/25/2004   TOTAL KNEE ARTHROPLASTY Left 11/11/2014   Procedure: LEFT TOTAL KNEE ARTHROPLASTY;  Surgeon: Shirley Chancy, MD;  Location: [facility];  Service: Orthopedics;  Laterality: Left;   TUBAL LIGATION  1992   UMBILICAL HERNIA REPAIR  2008    Current Outpatient Medications  Medication Sig Dispense Refill   amLODipine  (NORVASC ) 10 MG tablet Take 1 tablet (10 mg total) by mouth  at bedtime. 90 tablet 0   Aspirin -Acetaminophen  (GOODYS BACK & BODY PAIN) 500-325 MG PACK Take 1 packet by mouth daily as needed (pain).     aspirin -acetaminophen -caffeine  (EXCEDRIN  MIGRAINE) 250-250-65 MG tablet Take 2 tablets by mouth every 6 (six) hours as needed for headache.     diphenhydrAMINE  (BENADRYL ) 25 MG tablet Take 25 mg by mouth every 6 (six) hours as needed for itching.     DM-APAP-CPM (CORICIDIN HBP PO) Take 2 tablets by mouth at bedtime as needed (congestion).     FIBER ADULT GUMMIES PO Take 1 capsule by mouth daily.      fluticasone  (FLONASE ) 50 MCG/ACT nasal spray Place 2 sprays into both nostrils daily. 16 g 6   Iron-Vitamin C (VITRON-C PO) Take 1 tablet by mouth daily.     Multiple Vitamin (MULTIVITAMIN WITH MINERALS) TABS tablet Take 1 tablet by mouth daily.     Multiple Vitamins-Minerals (HAIR SKIN AND NAILS FORMULA) TABS Take 2 tablets by mouth daily.     nebivolol  (BYSTOLIC ) 10 MG tablet Take 1 tablet (10 mg total) by mouth at bedtime. 90 tablet 3   Alpha-D-Galactosidase (BEANO) TABS Take 1 tablet by mouth daily as needed (gas). (Patient not taking: Reported on 01/10/2024)     BLACK ELDERBERRY PO Take 1 capsule by mouth 2 (two) times daily.     OLANZapine  (ZYPREXA ) 5 MG tablet Take 1 tablet (5 mg total) by mouth at bedtime. 30 tablet 3   penicillin  v potassium (VEETID) 500 MG tablet Take 1 tablet (500 mg total) by mouth 3 (three) times daily. (Patient not taking: Reported on 01/10/2024) 21 tablet 0   Current Facility-Administered Medications  Medication Dose Route Frequency Provider Last Rate Last Admin   sodium chloride  flush (NS) 0.9 % injection 3 mL  3 mL Intravenous Q12H Shirley Riggs, MD        Allergies as of 01/10/2024 - Review Complete 01/10/2024  Allergen Reaction Noted   Lisinopril  Cough 06/26/2018   Maxzide [hydrochlorothiazide -triamterene ] Itching 07/16/2019   Beano meltaways [alpha-d-galactosidase] Rash 09/23/2020   Buspar  [buspirone ] Rash 07/16/2019   Crestor  [rosuvastatin  calcium ] Rash 05/01/2019   Hydrochlorothiazide  Rash 01/23/2022   Paxil  [paroxetine ] Other (See Comments) 12/07/2020   Simethicone Rash 09/23/2020    Family History  Problem Relation Age of Onset   Heart attack Maternal Grandmother    Hypertension Father    Alcohol abuse Father    Seizures Father    Hypertension Mother    Bone cancer Mother    Hypertension Sister    Migraines Sister    Thyroid  disease Sister    Colon cancer Sister        diagnosed in late 20s.    Thyroid  disease Sister    Cancer Sister         breast    Cancer Sister    Cancer Maternal Aunt        gyne cancer    Social History   Socioeconomic History   Marital status: Divorced    Spouse name: Not on file   Number of children: 3   Years of education: 12 grade    Highest education level: Some college, no degree  Occupational History   Occupation: disability   Tobacco Use   Smoking status: Never   Smokeless tobacco: Never  Vaping Use   Vaping status: Never Used  Substance and Sexual Activity   Alcohol use: Not Currently    Comment: occasionally- rarely.    Drug use: No   Sexual  activity: Not Currently    Birth control/protection: None, Post-menopausal  Other Topics Concern   Not on file  Social History Narrative   Son is living with her at the moment, patient states that she is isolating in her home and is depressed more lately    Social Drivers of Corporate investment banker Strain: Low Risk  (09/05/2023)   Overall Financial Resource Strain (CARDIA)    Difficulty of Paying Living Expenses: Not hard at all  Food Insecurity: No Food Insecurity (09/05/2023)   Hunger Vital Sign    Worried About Running Out of Food in the Last Year: Never true    Ran Out of Food in the Last Year: Never true  Transportation Needs: No Transportation Needs (09/05/2023)   PRAPARE - Administrator, Civil Service (Medical): No    Lack of Transportation (Non-Medical): No  Physical Activity: Inactive (09/05/2023)   Exercise Vital Sign    Days of Exercise per Week: 0 days    Minutes of Exercise per Session: 0 min  Stress: No Stress Concern Present (09/05/2023)   [employer] of Occupational Health - Occupational Stress Questionnaire    Feeling of Stress : Not at all  Recent Concern: Stress - Stress Concern Present (08/02/2023)   [employer] of Occupational Health - Occupational Stress Questionnaire    Feeling of Stress : Rather much  Social Connections: Socially Isolated (09/05/2023)   Social Connection and  Isolation Panel    Frequency of Communication with Friends and Family: Once a week    Frequency of Social Gatherings with Friends and Family: Never    Attends Religious Services: Never    Database administrator or Organizations: No    Attends Banker Meetings: Never    Marital Status: Divorced  Catering manager Violence: Not At Risk (09/05/2023)   Humiliation, Afraid, Rape, and Kick questionnaire    Fear of Current or Ex-Partner: No    Emotionally Abused: No    Physically Abused: No    Sexually Abused: No    Subjective: Review of Systems  Constitutional:  Negative for chills and fever.  HENT:  Negative for congestion and hearing loss.   Eyes:  Negative for blurred vision and double vision.  Respiratory:  Negative for cough and shortness of breath.   Cardiovascular:  Negative for chest pain and palpitations.  Gastrointestinal:  Positive for constipation. Negative for abdominal pain, blood in stool, diarrhea, heartburn, melena and vomiting.       Abdominal bloating  Genitourinary:  Negative for dysuria and urgency.  Musculoskeletal:  Negative for joint pain and myalgias.  Skin:  Negative for itching and rash.  Neurological:  Negative for dizziness and headaches.  Psychiatric/Behavioral:  Negative for depression. The patient is not nervous/anxious.        Objective: BP 113/73   Pulse 82   Temp 98.6 F (37 C)   Ht 5' 3 (1.6 m)   Wt 151 lb 12.8 oz (68.9 kg)   BMI 26.89 kg/m  Physical Exam Constitutional:      Appearance: Normal appearance.  HENT:     Head: Normocephalic and atraumatic.   Eyes:     Extraocular Movements: Extraocular movements intact.     Conjunctiva/sclera: Conjunctivae normal.    Cardiovascular:     Rate and Rhythm: Normal rate and regular rhythm.  Pulmonary:     Effort: Pulmonary effort is normal.     Breath sounds: Normal breath sounds.  Abdominal:  General: Bowel sounds are normal.     Palpations: Abdomen is soft.    Musculoskeletal:        General: No swelling. Normal range of motion.     Cervical back: Normal range of motion and neck supple.   Skin:    General: Skin is warm and dry.     Coloration: Skin is not jaundiced.   Neurological:     General: No focal deficit present.     Mental Status: She is alert and oriented to person, place, and time.   Psychiatric:        Mood and Affect: Mood normal.        Behavior: Behavior normal.      Assessment/Plan:  1.  Personal history of adenomatous colon polyps, family history of colon cancer in sister (age less than 96)- Will schedule for surveillance colonoscopy.The risks including infection, bleed, or perforation as well as benefits, limitations, alternatives and imponderables have been reviewed with the patient. Questions have been answered. All parties agreeable.  2.  Chronic constipation-currently improved on diet modification.  Continue to monitor.  3.  Abdominal bloating-improved on activated charcoal, continue.  Recommend she restart Lactaid prior to dairy consumption.  Thank you Dr. Antonetta for the kind referral  01/10/2024 11:21 AM   Disclaimer: This note was dictated with voice recognition software. Similar sounding words can inadvertently be transcribed and may not be corrected upon review.

## 2024-01-10 NOTE — Patient Instructions (Signed)
 We will schedule you for colonoscopy for surveillance purposes as well as family history of colon cancer.  Continue on activated charcoal for your bloating.  It was very nice meeting you today.  Dr. Cindie

## 2024-01-21 ENCOUNTER — Other Ambulatory Visit: Payer: Self-pay | Admitting: *Deleted

## 2024-01-21 ENCOUNTER — Encounter: Payer: Self-pay | Admitting: *Deleted

## 2024-01-21 MED ORDER — NA SULFATE-K SULFATE-MG SULF 17.5-3.13-1.6 GM/177ML PO SOLN
ORAL | 0 refills | Status: AC
Start: 2024-01-21 — End: ?

## 2024-01-28 ENCOUNTER — Telehealth: Payer: Self-pay | Admitting: *Deleted
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
## 2024-02-01 ENCOUNTER — Other Ambulatory Visit: Payer: Self-pay

## 2024-02-01 ENCOUNTER — Encounter: Payer: Self-pay | Admitting: Allergy & Immunology

## 2024-02-01 ENCOUNTER — Ambulatory Visit: Admitting: Allergy & Immunology

## 2024-02-01 VITALS — BP 138/84 | HR 71 | Temp 97.3°F | Resp 18 | Ht 62.21 in | Wt 150.8 lb

## 2024-02-01 DIAGNOSIS — R1084 Generalized abdominal pain: Secondary | ICD-10-CM

## 2024-02-01 DIAGNOSIS — R140 Abdominal distension (gaseous): Secondary | ICD-10-CM

## 2024-02-01 DIAGNOSIS — K5900 Constipation, unspecified: Secondary | ICD-10-CM

## 2024-02-01 NOTE — Patient Instructions (Addendum)
 1. Bloating and constipation - Some of these symptoms are not consistent with IgE mediated (allergic) reactions. - We are going to get some labs to look for Celiac disease and mast cell disease. - Food sheet provided so you can circle what you are interested in testing and go from there. - You might need to see a GI doctor to evaluate for this.   2. Return in about 1 week (around 02/08/2024) for SELECTED FOOD TESTING. You can have the follow up appointment with Dr. Iva or a Nurse Practicioner (our Nurse Practitioners are excellent and always have Physician oversight!).    Please inform us  of any Emergency Department visits, hospitalizations, or changes in symptoms. Call us  before going to the ED for breathing or allergy symptoms since we might be able to fit you in for a sick visit. Feel free to contact us  anytime with any questions, problems, or concerns.  It was a pleasure to see you again today!  Websites that have reliable patient information: 1. American Academy of Asthma, Allergy, and Immunology: www.aaaai.org 2. Food Allergy Research and Education (FARE): foodallergy.org 3. Mothers of Asthmatics: http://www.asthmacommunitynetwork.org 4. American College of Allergy, Asthma, and Immunology: www.acaai.org      "Like" us  on Facebook and Instagram for our latest updates!      A healthy democracy works best when [employer] participate! Make sure you are registered to vote! If you have moved or changed any of your contact information, you will need to get this updated before voting! Scan the QR codes below to learn more!

## 2024-02-01 NOTE — Progress Notes (Signed)
 FOLLOW UP   Date of Service/Encounter:  02/01/24  Consult requested by: Antonetta Rollene BRAVO, MD   Assessment:   Rash   Food intolerance - planning for skin testing in the future   Fibromyalgia  Plan/Recommendations:   1. Bloating and constipation - Some of these symptoms are not consistent with IgE mediated (allergic) reactions. - We are going to get some labs to look for Celiac disease and mast cell disease. - Food sheet provided so you can circle what you are interested in testing and go from there. - You might need to see a GI doctor to evaluate for this.   2. Return in about 1 week (around 02/08/2024) for SELECTED FOOD TESTING. You can have the follow up appointment with Dr. Iva or a Nurse Practicioner (our Nurse Practitioners are excellent and always have Physician oversight!).   This note in its entirety was forwarded to the Provider who requested this consultation.  Subjective:   Shirley Bush is a 68 y.o. female presenting today for evaluation of  Chief Complaint  Patient presents with   gluten issues    Wants to be tested for gluten issues, having problems with hair loss, also having issue with stomach issues.    Shirley Bush has a history of the following: Patient Active Problem List   Diagnosis Date Noted   Acute sinusitis 12/17/2023   Multiple allergies 12/04/2023   Unspecified mood (affective) disorder (HCC) 10/17/2023   IFG (impaired fasting glucose) 08/29/2023   Encounter for Medicare annual examination with abnormal findings 06/04/2023   Encounter for immunization 06/04/2023   Routine cervical smear 06/04/2023   Allergic rhinosinusitis 06/04/2023   Maxillary sinusitis, acute 01/12/2023   Depression, major, severe recurrence (HCC) 09/28/2022   Dermatitis 08/29/2022   Insomnia due to anxiety and fear 05/15/2022   GERD (gastroesophageal reflux disease) 01/23/2022   Multiple drug allergies 09/26/2021   Generalized joint pain 09/26/2021    Otalgia, left 08/15/2021   Hearing loss 08/15/2021   Leg swelling 06/12/2021   GAD (generalized anxiety disorder) 04/15/2021   High vitamin D  level 04/15/2021   Flatulence 09/23/2020   Tinea capitis 07/18/2019   Alopecia 07/18/2019   Ridged nails 07/18/2019   Situational anxiety 05/18/2019   Constipation 04/25/2018   Change in bowel movement 01/23/2018   FH: colon cancer in relative <76 years old 08/09/2017   DJD (degenerative joint disease) of knee 11/11/2014   Allergic rhinitis 07/19/2014   Plantar fasciitis 05/08/2013   Bilateral hand pain 04/18/2013   Heel pain 04/18/2013   Inflammatory polyarthropathy (HCC) 02/28/2013   Back pain with radiation 02/07/2013   Fibromyalgia 04/05/2012   Hyperlipidemia 02/11/2011   Chest pain in adult 05/04/2008   FIBROIDS, UTERUS 03/31/2008   NECK AND BACK PAIN 02/14/2008   Essential hypertension 10/18/2007    History obtained from: chart review and patient.  Discussed the use of AI scribe software for clinical note transcription with the patient and/or guardian, who gave verbal consent to proceed.  Shirley Bush was referred by Antonetta Rollene BRAVO, MD.     Shirley Bush is a 68 y.o. female presenting for a follow up visit.  She was last seen in April 2022.  At that time, Shirley Bush recommended adding back red meats into her diet since she had tried avoiding them with no improvement of the symptoms.  She has continued Claritin 10 mg twice daily.  We referred her to [facility] dermatology due to concerns about alopecia.  She did have  an elevated blood pressure as well.  PCP follow-up was recommended.  She was asked to follow-up as needed.  Since last visit, she has done somewhat well.  She experiences bloating, breakouts on her scalp, fever blisters on her lip, and blisters on her face. The blisters on her scalp are particularly concerning as they occur in areas where her hair is falling out. She initially thought wheat was the cause and  has since been cautious about gluten intake.  She has been experiencing stomach pain and constipation, which she attributes to certain foods and medications. She takes vitamin supplements and gas medications to manage these symptoms. She notes that she gets gas from eating potatoes, including instant potatoes, which she finds easier to prepare and consume daily. She uses charcoal capsules made with vegetable capsules to manage gas, as she cannot tolerate soft gels due to stomach upset.  She has a history of a herniated disc from an accident at work in 1998, which causes back pain. She received a steroid injection in the past, which worsened her pain, and she now manages her pain with ibuprofen  and Tylenol . She also has a history of fibromyalgia, for which she was previously prescribed gabapentin , but she no longer takes it as it was not effective long-term.  She has not seen a gastroenterologist for these issues but has had colonoscopies in the past due to a family history of colon cancer, as her sister had colon cancer. She canceled a recent colonoscopy appointment due to the preparation requirements. No blood in stool but reports some back pain associated with bloating.     Otherwise, there is no history of other atopic diseases, including asthma, food allergies, drug allergies, stinging insect allergies, or contact dermatitis. There is no significant infectious history. Vaccinations are up to date.    Past Medical History: Patient Active Problem List   Diagnosis Date Noted   Acute sinusitis 12/17/2023   Multiple allergies 12/04/2023   Unspecified mood (affective) disorder (HCC) 10/17/2023   IFG (impaired fasting glucose) 08/29/2023   Encounter for Medicare annual examination with abnormal findings 06/04/2023   Encounter for immunization 06/04/2023   Routine cervical smear 06/04/2023   Allergic rhinosinusitis 06/04/2023   Maxillary sinusitis, acute 01/12/2023   Depression, major, severe  recurrence (HCC) 09/28/2022   Dermatitis 08/29/2022   Insomnia due to anxiety and fear 05/15/2022   GERD (gastroesophageal reflux disease) 01/23/2022   Multiple drug allergies 09/26/2021   Generalized joint pain 09/26/2021   Otalgia, left 08/15/2021   Hearing loss 08/15/2021   Leg swelling 06/12/2021   GAD (generalized anxiety disorder) 04/15/2021   High vitamin D  level 04/15/2021   Flatulence 09/23/2020   Tinea capitis 07/18/2019   Alopecia 07/18/2019   Ridged nails 07/18/2019   Situational anxiety 05/18/2019   Constipation 04/25/2018   Change in bowel movement 01/23/2018   FH: colon cancer in relative <91 years old 08/09/2017   DJD (degenerative joint disease) of knee 11/11/2014   Allergic rhinitis 07/19/2014   Plantar fasciitis 05/08/2013   Bilateral hand pain 04/18/2013   Heel pain 04/18/2013   Inflammatory polyarthropathy (HCC) 02/28/2013   Back pain with radiation 02/07/2013   Fibromyalgia 04/05/2012   Hyperlipidemia 02/11/2011   Chest pain in adult 05/04/2008   FIBROIDS, UTERUS 03/31/2008   NECK AND BACK PAIN 02/14/2008   Essential hypertension 10/18/2007    Medication List:  Allergies as of 02/01/2024       Reactions   Lisinopril  Cough   Maxzide [hydrochlorothiazide -triamterene ] Itching  Rash   Beano Meltaways [alpha-d-galactosidase] Rash   Buspar  [buspirone ] Rash   Crestor  [rosuvastatin  Calcium ] Rash   Hydrochlorothiazide  Rash   Paxil  [paroxetine ] Other (See Comments)   Neck pain   Simethicone  Rash        Medication List        Accurate as of February 01, 2024 11:59 PM. If you have any questions, ask your nurse or doctor.          amLODipine  10 MG tablet Commonly known as: NORVASC  Take 1 tablet (10 mg total) by mouth at bedtime.   aspirin -acetaminophen -caffeine  250-250-65 MG tablet Commonly known as: EXCEDRIN  MIGRAINE Take 2 tablets by mouth every 6 (six) hours as needed for headache.   BLACK ELDERBERRY PO Take 1 capsule by mouth 2 (two)  times daily.   CORICIDIN HBP PO Take 2 tablets by mouth at bedtime as needed (congestion).   diphenhydrAMINE  25 MG tablet Commonly known as: BENADRYL  Take 25 mg by mouth every 6 (six) hours as needed for itching.   FIBER ADULT GUMMIES PO Take 1 capsule by mouth daily.   fluticasone  50 MCG/ACT nasal spray Commonly known as: FLONASE  Place 2 sprays into both nostrils daily.   Goodys Back & Body Pain 500-325 MG Pack Generic drug: Aspirin -Acetaminophen  Take 1 packet by mouth daily as needed (pain).   Hair Skin and Nails Formula Tabs Take 2 tablets by mouth daily.   multivitamin with minerals Tabs tablet Take 1 tablet by mouth daily.   Na Sulfate-K Sulfate-Mg Sulfate concentrate 17.5-3.13-1.6 GM/177ML Soln Commonly known as: SUPREP As directed   nebivolol  10 MG tablet Commonly known as: BYSTOLIC  Take 1 tablet (10 mg total) by mouth at bedtime.   OLANZapine  5 MG tablet Commonly known as: ZyPREXA  Take 1 tablet (5 mg total) by mouth at bedtime.   VITRON-C PO Take 1 tablet by mouth daily.         Review of systems otherwise negative other than that mentioned in the HPI.    Objective:   Blood pressure 138/84, pulse 71, temperature (!) 97.3 F (36.3 C), temperature source Temporal, resp. rate 18, height 5' 2.21 (1.58 m), weight 150 lb 12.8 oz (68.4 kg), SpO2 98%. Body mass index is 27.4 kg/m.     Physical Exam Constitutional:      Appearance: She is well-developed.     Comments: Talkative.  Flight of ideas at times.  HENT:     Head: Normocephalic and atraumatic.     Right Ear: Tympanic membrane, ear canal and external ear normal. No drainage, swelling or tenderness. Tympanic membrane is not injected, scarred, erythematous, retracted or bulging.     Left Ear: Tympanic membrane, ear canal and external ear normal. No drainage, swelling or tenderness. Tympanic membrane is not injected, scarred, erythematous, retracted or bulging.     Nose: No nasal deformity,  septal deviation, mucosal edema or rhinorrhea.     Right Turbinates: Enlarged, swollen and pale.     Left Turbinates: Enlarged, swollen and pale.     Right Sinus: No maxillary sinus tenderness or frontal sinus tenderness.     Left Sinus: No maxillary sinus tenderness or frontal sinus tenderness.     Mouth/Throat:     Mouth: Mucous membranes are not pale and not dry.     Pharynx: Uvula midline.  Eyes:     General:        Right eye: No discharge.        Left eye: No discharge.  Conjunctiva/sclera: Conjunctivae normal.     Right eye: Right conjunctiva is not injected. No chemosis.    Left eye: Left conjunctiva is not injected. No chemosis.    Pupils: Pupils are equal, round, and reactive to light.  Cardiovascular:     Rate and Rhythm: Normal rate and regular rhythm.     Heart sounds: Normal heart sounds.  Pulmonary:     Effort: Pulmonary effort is normal. No tachypnea, accessory muscle usage or respiratory distress.     Breath sounds: Normal breath sounds. No wheezing, rhonchi or rales.  Chest:     Chest wall: No tenderness.  Lymphadenopathy:     Head:     Right side of head: No submandibular, tonsillar or occipital adenopathy.     Left side of head: No submandibular, tonsillar or occipital adenopathy.     Cervical: No cervical adenopathy.  Skin:    General: Skin is warm.     Capillary Refill: Capillary refill takes less than 2 seconds.     Coloration: Skin is not pale.     Findings: No abrasion, erythema, petechiae or rash. Rash is not papular, urticarial or vesicular.     Comments: No eczematous or urticarial lesions noted.  Neurological:     Mental Status: She is alert.      Diagnostic studies: deferred due to insurance stipulations that require a separate visit for testing         Marty Shaggy, MD [facility] 

## 2024-02-04 ENCOUNTER — Encounter (HOSPITAL_COMMUNITY): Payer: Self-pay

## 2024-02-04 ENCOUNTER — Ambulatory Visit (HOSPITAL_COMMUNITY): Admit: 2024-02-04 | Admitting: Internal Medicine

## 2024-02-04 LAB — CELIAC DISEASE COMPREHENSIVE PANEL WITH REFLEXES
IgA/Immunoglobulin A, Serum: 168 mg/dL (ref 87–352)
Transglutaminase IgA: <2 U/mL (ref 0–3)

## 2024-02-04 LAB — ALPHA-GAL PANEL
Allergen Lamb IgE: <0.1 kU/L
Beef IgE: <0.1 kU/L
IgE (Immunoglobulin E), Serum: <2 [IU]/mL — ABNORMAL LOW (ref 6–495)
O215-IgE Alpha-Gal: <0.1 kU/L
Pork IgE: <0.1 kU/L

## 2024-02-04 LAB — TRYPTASE: Tryptase: 4.3 ug/L (ref 2.2–13.2)

## 2024-02-04 SURGERY — COLONOSCOPY
Anesthesia: Choice

## 2024-02-13 ENCOUNTER — Ambulatory Visit: Payer: Self-pay | Admitting: Allergy & Immunology

## 2024-02-13 ENCOUNTER — Ambulatory Visit (HOSPITAL_COMMUNITY): Admitting: Psychiatry

## 2024-02-20 ENCOUNTER — Encounter: Payer: Self-pay | Admitting: Allergy & Immunology

## 2024-02-20 ENCOUNTER — Ambulatory Visit: Admitting: Allergy & Immunology

## 2024-02-20 DIAGNOSIS — R140 Abdominal distension (gaseous): Secondary | ICD-10-CM | POA: Diagnosis not present

## 2024-02-20 DIAGNOSIS — R1084 Generalized abdominal pain: Secondary | ICD-10-CM | POA: Diagnosis not present

## 2024-02-20 DIAGNOSIS — T781XXD Other adverse food reactions, not elsewhere classified, subsequent encounter: Secondary | ICD-10-CM | POA: Diagnosis not present

## 2024-02-20 NOTE — Patient Instructions (Addendum)
 1. Bloating and constipation - Everything we tested today was NEGATIVE with an excellent positive control.  - There is a the low positive predictive value of food allergy  testing and hence the high possibility of false positives. - In contrast, food allergy  testing has a high negative predictive value, therefore if testing is negative we can be relatively assured that they are indeed negative.  - This could still be an intolerance. - Feel free to try to avoid wheat to see if this helps. - Thankfully testing was NEGATIVE in the blood for Celiac disease.  - We can send you to see GI if you are interested.   2. Return in about 3 months (around 05/22/2024). You can have the follow up appointment with Dr. Iva or a Nurse Practicioner (our Nurse Practitioners are excellent and always have Physician oversight!).    Please inform us  of any Emergency Department visits, hospitalizations, or changes in symptoms. Call us  before going to the ED for breathing or allergy  symptoms since we might be able to fit you in for a sick visit. Feel free to contact us  anytime with any questions, problems, or concerns.  It was a pleasure to see you again today!  Websites that have reliable patient information: 1. American Academy of Asthma, Allergy , and Immunology: www.aaaai.org 2. Food Allergy  Research and Education (FARE): foodallergy.org 3. Mothers of Asthmatics: http://www.asthmacommunitynetwork.org 4. American College of Allergy , Asthma, and Immunology: www.acaai.org      "Like" us  on Facebook and Instagram for our latest updates!      A healthy democracy works best when [employer] participate! Make sure you are registered to vote! If you have moved or changed any of your contact information, you will need to get this updated before voting! Scan the QR codes below to learn more!      Component     Latest Ref Rng 02/01/2024  Transglutaminase IgA     0 - 3 U/mL <2   IgA/Immunoglobulin A, Serum      87 - 352 mg/dL 831     Component     Latest Ref Rng 02/01/2024  Tryptase     2.2 - 13.2 ug/L 4.3      Component     Latest Ref Rng 02/01/2024  Beef IgE     Class 0 kU/L <0.10   Pork IgE     Class 0 kU/L <0.10   Class Description Allergens Comment   IgE (Immunoglobulin E), Serum     6 - 495 IU/mL <2 (L)   Allergen Lamb IgE     Class 0 kU/L <0.10   O215-IgE Alpha-Gal     Class 0 kU/L <0.10     Legend: (L) Low\   Airborne Adult Perc - 02/20/24 0925     Time Antigen Placed 0915    Allergen Manufacturer Jestine    Location Back    Number of Test 25          Food Adult Perc - 02/20/24 0900     Time Antigen Placed 9073    Allergen Manufacturer Jestine    Location Back    Number of allergen test 25    1. Peanut Negative    2. Soybean Negative    3. Wheat Negative    4. Sesame Negative    5. Milk, Cow Negative    6. Casein Negative    7. Egg White, Chicken Negative    8. Shellfish Mix Negative    9. Fish Mix Negative  10. Cashew Negative    11. Walnut Food Negative    12. Almond Negative    13. Hazelnut Negative    14. Pecan Food Negative    15. Pistachio Negative    16. [place] Nut Negative    17. Coconut Negative    28. Oat  Negative    30. Barley Negative    31. Rye  Negative    33. [place] Meat Negative    39. White Potato Negative    40. Sweet Potato Negative    66. Chocolate/Cacao Bean Negative    71. Pepper, Black Negative         Food Intolerance Versus Food Allergy   Food IntoleranceSome of the symptoms of food intolerance and food allergy  are similar, but the differences between the two are very important. Eating a food you are intolerant to can leave you feeling miserable. However, if you have a true food allergy , your body's reaction to this food could be life-threatening.  Digestive system versus immune system  A food intolerance response takes place in the digestive system. It occurs when you are unable to properly breakdown the food. This  could be due to enzyme deficiencies, sensitivity to food additives or reactions to naturally occurring chemicals in foods. Often, people can eat small amounts of the food without causing problems.  A food allergic reaction involves the immune system. Your immune system controls how your body defends itself. For instance, if you have an allergy  to cow's milk, your immune system identifies cow's milk as an invader or allergen. Your immune system overreacts by producing antibodies called Immunoglobulin E (IgE). These antibodies travel to cells that release chemicals, causing an allergic reaction. Each type of IgE has a specific "radar" for each type of allergen.  Unlike an intolerance to food, a food allergy  can cause a serious or even life-threatening reaction by eating a microscopic amount, touching or inhaling the food.  Symptoms of allergic reactions to foods are generally seen on the skin (hives, itchiness, swelling of the skin). Gastrointestinal symptoms may include vomiting and diarrhea. Respiratory symptoms may accompany skin and gastrointestinal symptoms, but don't usually occur alone.  Anaphylaxis (pronounced an-a-fi-LAK-sis) is a serious allergic reaction that happens very quickly. Symptoms of anaphylaxis may include difficulty breathing, dizziness or loss of consciousness. Without immediate treatment--an injection of epinephrine (adrenalin) and expert care--anaphylaxis can be fatal.  To the Point: there is a very serious difference between being intolerant to a food and having a food allergy .     Regarding Food IgG Testing: In IgG testing, the blood is tested for IgG antibodies instead of being tested for IgE antibodies (i.e., the antibodies typically associated with food allergies). The existence of serum IgG antibodies towards particular foods is claimed by many practitioners as a tool to diagnose food allergy  or intolerance. The problem with this is that IgG is a "memory antibody." IgG  signifies exposure to a food, not allergy  to a food. Since a normal immune system should make IgG antibodies to foreign proteins, a positive IgG test to a food is a sign of a normal immune system. In fact, a positive result can actually indicate tolerance for the food, not intolerance. There is no scientific evidence to support IgG testing for the diagnosis of food allergies.

## 2024-02-20 NOTE — Progress Notes (Signed)
 FOLLOW UP  Date of Service/Encounter:  02/20/24   Assessment:   Rash   Food intolerance -  with negative results to the foods tested with adequate controls   Fibromyalgia  Plan/Recommendations:   1. Bloating and constipation - Everything we tested today was NEGATIVE with an excellent positive control.  - There is a the low positive predictive value of food allergy  testing and hence the high possibility of false positives. - In contrast, food allergy  testing has a high negative predictive value, therefore if testing is negative we can be relatively assured that they are indeed negative.  - This could still be an intolerance. - Feel free to try to avoid wheat to see if this helps. - Thankfully testing was NEGATIVE in the blood for Celiac disease.  - We can send you to see GI if you are interested.   2. Return in about 3 months (around 05/22/2024). You can have the follow up appointment with Dr. Iva or a Nurse Practicioner (our Nurse Practitioners are excellent and always have Physician oversight!).   Subjective:   Shirley Bush is a 68 y.o. female presenting today for follow up of No chief complaint on file.   Shirley Bush has a history of the following: Patient Active Problem List   Diagnosis Date Noted   Acute sinusitis 12/17/2023   Multiple allergies 12/04/2023   Unspecified mood (affective) disorder (HCC) 10/17/2023   IFG (impaired fasting glucose) 08/29/2023   Encounter for Medicare annual examination with abnormal findings 06/04/2023   Encounter for immunization 06/04/2023   Routine cervical smear 06/04/2023   Allergic rhinosinusitis 06/04/2023   Maxillary sinusitis, acute 01/12/2023   Depression, major, severe recurrence (HCC) 09/28/2022   Dermatitis 08/29/2022   Insomnia due to anxiety and fear 05/15/2022   GERD (gastroesophageal reflux disease) 01/23/2022   Multiple drug allergies 09/26/2021   Generalized joint pain 09/26/2021   Otalgia, left  08/15/2021   Hearing loss 08/15/2021   Leg swelling 06/12/2021   GAD (generalized anxiety disorder) 04/15/2021   High vitamin D  level 04/15/2021   Flatulence 09/23/2020   Tinea capitis 07/18/2019   Alopecia 07/18/2019   Ridged nails 07/18/2019   Situational anxiety 05/18/2019   Constipation 04/25/2018   Change in bowel movement 01/23/2018   FH: colon cancer in relative <81 years old 08/09/2017   DJD (degenerative joint disease) of knee 11/11/2014   Allergic rhinitis 07/19/2014   Plantar fasciitis 05/08/2013   Bilateral hand pain 04/18/2013   Heel pain 04/18/2013   Inflammatory polyarthropathy (HCC) 02/28/2013   Back pain with radiation 02/07/2013   Fibromyalgia 04/05/2012   Hyperlipidemia 02/11/2011   Chest pain in adult 05/04/2008   FIBROIDS, UTERUS 03/31/2008   NECK AND BACK PAIN 02/14/2008   Essential hypertension 10/18/2007    History obtained from: chart review and patient.  Discussed the use of AI scribe software for clinical note transcription with the patient and/or guardian, who gave verbal consent to proceed.  Shirley Bush is a 68 y.o. female presenting for skin testing. She was last seen on July 2025. We could not do testing because her insurance company does not cover testing on the same day as a New Patient visit. She has been off of all antihistamines 3 days in anticipation of the testing.   She was last seen in July 2025.   She has been experiencing symptoms suggestive of histamine reactions. Testing has been conducted, and the results show that she has not taken antihistamines such as Benadryl  or  Zyrtec recently. All other tests returned negative.  She has been experimenting with her diet to identify potential food intolerances. She is specifically trying to avoid wheat, despite her fondness for 'hundred wheat bread'. She is considering gluten-free options but notes that they do not provide the same satisfaction.  Otherwise, there have been no changes to her past  medical history, surgical history, family history, or social history.    Review of systems otherwise negative other than that mentioned in the HPI.    Objective:   There were no vitals taken for this visit. There is no height or weight on file to calculate BMI.    Physical exam deferred since this was a skin testing appointment only.   Diagnostic studies:   Allergy  Studies:     Food Adult Perc - 02/20/24 0900     Time Antigen Placed 9073    Allergen Manufacturer Jestine    Location Back    Number of allergen test 25    1. Peanut Negative    2. Soybean Negative    3. Wheat Negative    4. Sesame Negative    5. Milk, Cow Negative    6. Casein Negative    7. Egg White, Chicken Negative    8. Shellfish Mix Negative    9. Fish Mix Negative    10. Cashew Negative    11. Walnut Food Negative    12. Almond Negative    13. Hazelnut Negative    14. Pecan Food Negative    15. Pistachio Negative    16. [place] Nut Negative    17. Coconut Negative    28. Oat  Negative    30. Barley Negative    31. Rye  Negative    33. [place] Meat Negative    39. White Potato Negative    40. Sweet Potato Negative    66. Chocolate/Cacao Bean Negative    71. Pepper, Black Negative          Allergy  testing results were read and interpreted by myself, documented by clinical staff.      Marty Shaggy, MD  [facility] 

## 2024-02-22 ENCOUNTER — Encounter: Payer: Self-pay | Admitting: Internal Medicine

## 2024-02-27 ENCOUNTER — Ambulatory Visit (INDEPENDENT_AMBULATORY_CARE_PROVIDER_SITE_OTHER): Admitting: Psychiatry

## 2024-02-27 DIAGNOSIS — F332 Major depressive disorder, recurrent severe without psychotic features: Secondary | ICD-10-CM | POA: Diagnosis not present
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
## 2024-02-28 ENCOUNTER — Other Ambulatory Visit (HOSPITAL_BASED_OUTPATIENT_CLINIC_OR_DEPARTMENT_OTHER): Payer: Self-pay | Admitting: Family

## 2024-02-28 DIAGNOSIS — I10 Essential (primary) hypertension: Secondary | ICD-10-CM

## 2024-03-03 ENCOUNTER — Ambulatory Visit (HOSPITAL_COMMUNITY): Admitting: Registered Nurse

## 2024-03-03 ENCOUNTER — Encounter (HOSPITAL_COMMUNITY): Payer: Self-pay | Admitting: Registered Nurse

## 2024-03-03 VITALS — BP 171/80 | HR 81 | Temp 98.1°F | Ht 62.21 in | Wt 147.2 lb

## 2024-03-03 DIAGNOSIS — F332 Major depressive disorder, recurrent severe without psychotic features: Secondary | ICD-10-CM | POA: Diagnosis not present

## 2024-03-03 DIAGNOSIS — F411 Generalized anxiety disorder: Secondary | ICD-10-CM | POA: Diagnosis not present

## 2024-03-03 MED ORDER — FLUOXETINE HCL 10 MG PO TABS
10.0000 mg | ORAL_TABLET | Freq: Every day | ORAL | 0 refills | Status: DC
Start: 2024-03-03 — End: 2024-05-26

## 2024-03-03 NOTE — Patient Instructions (Signed)
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
## 2024-03-12 ENCOUNTER — Ambulatory Visit (HOSPITAL_COMMUNITY): Admitting: Psychiatry

## 2024-03-25 ENCOUNTER — Ambulatory Visit: Admitting: Family Medicine

## 2024-03-26 ENCOUNTER — Telehealth: Payer: Self-pay | Admitting: Family Medicine

## 2024-03-26 ENCOUNTER — Ambulatory Visit (HOSPITAL_COMMUNITY): Admitting: Psychiatry

## 2024-03-26 NOTE — Telephone Encounter (Signed)
 Copied from CRM [id number]. Topic: General - Other >> Mar 26, 2024 12:48 PM Zebedee SAUNDERS wrote: Reason for CRM: Pt will need prescription for flu shot sent to [facility] - [place], [place] - [address] [place]  [address] Phone: [phone] Fax: [phone]

## 2024-03-27 ENCOUNTER — Encounter (HOSPITAL_COMMUNITY): Payer: Self-pay

## 2024-03-31 ENCOUNTER — Other Ambulatory Visit: Payer: Self-pay

## 2024-03-31 DIAGNOSIS — Z23 Encounter for immunization: Secondary | ICD-10-CM

## 2024-03-31 MED ORDER — UNABLE TO FIND: 0 refills | Status: AC

## 2024-03-31 NOTE — Telephone Encounter (Signed)
 Faxed

## 2024-04-09 ENCOUNTER — Ambulatory Visit: Admitting: Internal Medicine

## 2024-04-14 ENCOUNTER — Ambulatory Visit (HOSPITAL_COMMUNITY): Admitting: Registered Nurse

## 2024-05-06 ENCOUNTER — Ambulatory Visit (HOSPITAL_COMMUNITY): Admitting: Psychiatry

## 2024-05-06 ENCOUNTER — Encounter (HOSPITAL_COMMUNITY): Payer: Self-pay

## 2024-05-13 ENCOUNTER — Ambulatory Visit: Payer: Self-pay

## 2024-05-13 NOTE — Telephone Encounter (Signed)
 FYI Only or Action Required?: FYI only for provider.  Patient was last seen in primary care on 11/29/2023 by Shirley Bush BRAVO, MD.  Called Nurse Triage reporting Sore Throat.  Symptoms began weekend and worsening.  Interventions attempted: Nothing.  Symptoms are: gradually worsening.  Triage Disposition: No disposition on file.  Patient/caregiver understands and will follow disposition?:     Copied from CRM [id number]. Topic: Clinical - Red Word Triage >> May 13, 2024  4:01 PM Tobias L wrote: Red Word that prompted transfer to Nurse Triage: Patient states her throat is sore on both sides, and her glands on both sides of her ears are a bit swollen. Denies any fever, congestion in head. Tonsil stones she is unable to pop out. Reason for Disposition  SEVERE throat pain (e.g., excruciating)    Scheduled patient at [facility] urgent care  Answer Assessment - Initial Assessment Questions 1. ONSET: When did the throat start hurting? (Hours or days ago)      Weekend and worsening 2. SEVERITY: How bad is the sore throat? (Scale 1-10; mild, moderate or severe)     Moderate  3. STREP EXPOSURE: Has there been any exposure to strep within the past week? If Yes, ask: What type of contact occurred?      na 4.  VIRAL SYMPTOMS: Are there any symptoms of a cold, such as a runny nose, cough, hoarse voice or red eyes?      Runny nose, cough 5. FEVER: Do you have a fever? If Yes, ask: What is your temperature, how was it measured, and when did it start?     no 6. PUS ON THE TONSILS: Is there pus on the tonsils in the back of your throat?     Tonsil stones 7. OTHER SYMPTOMS: Do you have any other symptoms? (e.g., difficulty breathing, headache, rash)    bilateral Ears pops, glands swollen, headache, post nasal drip, nasal congestion-grayish 8. PREGNANCY: Is there any chance you are pregnant? When was your last menstrual period?     Na  Pt would like to request ABX  for  sinus infection - pt currently having transportation issues  Protocols used: Sore Throat-A-AH

## 2024-05-14 ENCOUNTER — Ambulatory Visit: Admitting: Family Medicine

## 2024-05-14 ENCOUNTER — Ambulatory Visit: Payer: Self-pay

## 2024-05-14 NOTE — Telephone Encounter (Signed)
Patient advised UC.

## 2024-05-16 ENCOUNTER — Ambulatory Visit: Admitting: Family Medicine

## 2024-05-20 ENCOUNTER — Ambulatory Visit (HOSPITAL_COMMUNITY): Admitting: Psychiatry

## 2024-05-26 ENCOUNTER — Encounter (HOSPITAL_COMMUNITY): Payer: Self-pay | Admitting: Registered Nurse

## 2024-05-26 ENCOUNTER — Ambulatory Visit (HOSPITAL_COMMUNITY): Admitting: Registered Nurse

## 2024-05-26 VITALS — BP 175/101 | HR 72 | Ht 62.0 in | Wt 147.8 lb

## 2024-05-26 DIAGNOSIS — F332 Major depressive disorder, recurrent severe without psychotic features: Secondary | ICD-10-CM

## 2024-05-26 MED ORDER — OLANZAPINE 2.5 MG PO TABS: 2.5000 mg | ORAL_TABLET | Freq: Every day | ORAL | 0 refills | Status: AC

## 2024-05-26 MED ORDER — OLANZAPINE 7.5 MG PO TABS: 7.5000 mg | ORAL_TABLET | Freq: Every day | ORAL | 3 refills | Status: AC

## 2024-05-26 NOTE — Patient Instructions (Signed)
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
## 2024-06-05 ENCOUNTER — Ambulatory Visit (HOSPITAL_COMMUNITY): Admitting: Psychiatry

## 2024-06-27 ENCOUNTER — Ambulatory Visit: Admitting: Family Medicine

## 2024-06-27 ENCOUNTER — Other Ambulatory Visit (HOSPITAL_COMMUNITY): Payer: Self-pay | Admitting: Family Medicine

## 2024-06-27 ENCOUNTER — Encounter: Payer: Self-pay | Admitting: Family Medicine

## 2024-06-27 VITALS — BP 158/84 | HR 70 | Resp 16 | Ht 62.0 in | Wt 146.1 lb

## 2024-06-27 DIAGNOSIS — Z1211 Encounter for screening for malignant neoplasm of colon: Secondary | ICD-10-CM | POA: Insufficient documentation

## 2024-06-27 DIAGNOSIS — R102 Pelvic and perineal pain unspecified side: Secondary | ICD-10-CM

## 2024-06-27 DIAGNOSIS — Z0001 Encounter for general adult medical examination with abnormal findings: Secondary | ICD-10-CM | POA: Insufficient documentation

## 2024-06-27 DIAGNOSIS — Z1231 Encounter for screening mammogram for malignant neoplasm of breast: Secondary | ICD-10-CM

## 2024-06-27 DIAGNOSIS — E785 Hyperlipidemia, unspecified: Secondary | ICD-10-CM

## 2024-06-27 DIAGNOSIS — R7301 Impaired fasting glucose: Secondary | ICD-10-CM

## 2024-06-27 DIAGNOSIS — I10 Essential (primary) hypertension: Secondary | ICD-10-CM

## 2024-06-27 DIAGNOSIS — D219 Benign neoplasm of connective and other soft tissue, unspecified: Secondary | ICD-10-CM

## 2024-06-27 DIAGNOSIS — E673 Hypervitaminosis D: Secondary | ICD-10-CM

## 2024-06-27 MED ORDER — NEBIVOLOL HCL 10 MG PO TABS: 10.0000 mg | ORAL_TABLET | Freq: Every day | ORAL | 5 refills | Status: AC

## 2024-06-27 NOTE — Assessment & Plan Note (Signed)
 Gyne eval c/o pelvic pain x 4 to 6 monhts, worse with constipation

## 2024-06-27 NOTE — Assessment & Plan Note (Addendum)
 6 month history , increased pain, associated with constipation up to an 8 , baseline is 3 to 4

## 2024-06-27 NOTE — Assessment & Plan Note (Signed)

## 2024-06-27 NOTE — Progress Notes (Signed)
° ° °  Shirley Bush     MRN: [id number]      DOB: [birth year]  Chief Complaint  Patient presents with   Hypertension    4 month follow up     HPI: Patient is in for annual physical exam. No other health concerns are expressed or addressed at the visit. Recent labs,  are reviewed. Immunization is reviewed , and  updated if needed.   PE: BP (!) 158/84   Pulse 70   Resp 16   Ht 5' 2 (1.575 m)   Wt 146 lb 1.9 oz (66.3 kg)   SpO2 98%   BMI 26.73 kg/m   Pleasant  female, alert and oriented x 3, in no cardio-pulmonary distress. Afebrile. HEENT No facial trauma or asymetry. Sinuses non tender.  Extra occullar muscles intact.. External ears normal, . Neck: supple, no adenopathy,JVD or thyromegaly.No bruits.  Chest: Clear to ascultation bilaterally.No crackles or wheezes. Non tender to palpation   Cardiovascular system; Heart sounds normal,  S1 and  S2 ,no S3.  No murmur, or thrill. Apical beat not displaced Peripheral pulses normal.  Abdomen: Soft, lower abdomen tender to superficial and deep palpation, no guarding or rebound tenderness  Musculoskeletal exam: Full ROM of spine, hips , shoulders and knees. No deformity ,swelling or crepitus noted. No muscle wasting or atrophy.   Neurologic: Cranial nerves 2 to 12 intact. Power, tone ,sensation and reflexes normal throughout. No disturbance in gait. No tremor.  Skin: Intact, no ulceration, erythema , scaling or rash noted. Pigmentation normal throughout  Psych; Normal mood and affect. Judgement and concentration normal   Assessment & Plan:  Encounter for Medicare annual examination with abnormal findings Annual exam as documented. Counseling done  re healthy lifestyle involving commitment to 150 minutes exercise per week, heart healthy diet, and attaining healthy weight.The importance of adequate sleep also discussed. Regular seat belt use and home safety, is also discussed. Changes in health habits are  decided on by the patient with goals and time frames  set for achieving them. Immunization and cancer screening needs are specifically addressed at this visit.   Annual visit for general adult medical examination with abnormal findings Annual exam as documented. Counseling done  re healthy lifestyle involving commitment to 150 minutes exercise per week, heart healthy diet, and attaining healthy weight.The importance of adequate sleep also discussed. Regular seat belt use and home safety, is also discussed. Changes in health habits are decided on by the patient with goals and time frames  set for achieving them. Immunization and cancer screening needs are specifically addressed at this visit.   Pelvic pain 6 month history , increased pain, associated with constipation up to an 8 , baseline is 3 to 4  Fibroids Gyne eval c/o pelvic pain x 4 to 6 monhts, worse with constipation  Colon cancer screening Colonoscopy past due , h/o polyp, refer for colonoscopy  5 year rept per recommendation of GI)

## 2024-06-27 NOTE — Patient Instructions (Addendum)
 F/U in 18 weeks  Fasting labs ordered in May 2025 next week and add vit D level please  Please schedule mammogram at checkout  You are referred for colonoscopy very important you get this  You are referred to Gynecology  Pls take th  2nd BP pill every day also, amlodipine  and bystolic   It is important that you exercise regularly at least 30 minutes 5 times a week. If you develop chest pain, have severe difficulty breathing, or feel very tired, stop exercising immediately and seek medical attention

## 2024-06-27 NOTE — Assessment & Plan Note (Signed)
 Colonoscopy past due , h/o polyp, refer for colonoscopy  5 year rept per recommendation of GI)

## 2024-07-02 ENCOUNTER — Encounter (INDEPENDENT_AMBULATORY_CARE_PROVIDER_SITE_OTHER): Payer: Self-pay | Admitting: *Deleted

## 2024-07-03 LAB — CMP14+EGFR
ALT: 22 IU/L (ref 0–32)
AST: 25 IU/L (ref 0–40)
Albumin: 4.5 g/dL (ref 3.9–4.9)
Alkaline Phosphatase: 85 IU/L (ref 49–135)
BUN/Creatinine Ratio: 7 — ABNORMAL LOW (ref 12–28)
BUN: 7 mg/dL — ABNORMAL LOW (ref 8–27)
Bilirubin Total: 0.5 mg/dL (ref 0.0–1.2)
CO2: 23 mmol/L (ref 20–29)
Calcium: 9.8 mg/dL (ref 8.7–10.3)
Chloride: 102 mmol/L (ref 96–106)
Creatinine, Ser: 1.03 mg/dL — ABNORMAL HIGH (ref 0.57–1.00)
Globulin, Total: 2.8 g/dL (ref 1.5–4.5)
Glucose: 105 mg/dL — ABNORMAL HIGH (ref 70–99)
Potassium: 4.6 mmol/L (ref 3.5–5.2)
Sodium: 140 mmol/L (ref 134–144)
Total Protein: 7.3 g/dL (ref 6.0–8.5)
eGFR: 60 mL/min/1.73 (ref >59)

## 2024-07-03 LAB — LIPID PANEL
Chol/HDL Ratio: 3.9 ratio (ref 0.0–4.4)
Cholesterol, Total: 223 mg/dL — ABNORMAL HIGH (ref 100–199)
HDL: 57 mg/dL (ref >39)
LDL Chol Calc (NIH): 148 mg/dL — ABNORMAL HIGH (ref 0–99)
Triglycerides: 99 mg/dL (ref 0–149)
VLDL Cholesterol Cal: 18 mg/dL (ref 5–40)

## 2024-07-03 LAB — HEMOGLOBIN A1C
Est. average glucose Bld gHb Est-mCnc: 114 mg/dL
Hgb A1c MFr Bld: 5.6 % (ref 4.8–5.6)

## 2024-07-03 LAB — CBC WITH DIFFERENTIAL/PLATELET
Basophils Absolute: 0.1 x10E3/uL (ref 0.0–0.2)
Basos: 1 %
EOS (ABSOLUTE): 0.2 x10E3/uL (ref 0.0–0.4)
Eos: 4 %
Hematocrit: 38.4 % (ref 34.0–46.6)
Hemoglobin: 12.9 g/dL (ref 11.1–15.9)
Immature Grans (Abs): 0 x10E3/uL (ref 0.0–0.1)
Immature Granulocytes: 0 %
Lymphocytes Absolute: 1.9 x10E3/uL (ref 0.7–3.1)
Lymphs: 32 %
MCH: 30.9 pg (ref 26.6–33.0)
MCHC: 33.6 g/dL (ref 31.5–35.7)
MCV: 92 fL (ref 79–97)
Monocytes Absolute: 0.5 x10E3/uL (ref 0.1–0.9)
Monocytes: 8 %
Neutrophils Absolute: 3.4 x10E3/uL (ref 1.4–7.0)
Neutrophils: 55 %
Platelets: 422 x10E3/uL (ref 150–450)
RBC: 4.18 x10E6/uL (ref 3.77–5.28)
RDW: 12.6 % (ref 11.7–15.4)
WBC: 6 x10E3/uL (ref 3.4–10.8)

## 2024-07-03 LAB — VITAMIN D 25 HYDROXY (VIT D DEFICIENCY, FRACTURES): Vit D, 25-Hydroxy: 72.1 ng/mL (ref 30.0–100.0)

## 2024-07-03 LAB — TSH: TSH: 4.08 u[IU]/mL (ref 0.450–4.500)

## 2024-07-07 ENCOUNTER — Ambulatory Visit: Payer: Self-pay | Admitting: Family Medicine

## 2024-07-07 NOTE — Telephone Encounter (Signed)
Pt called back and verbalized understanding.

## 2024-07-14 ENCOUNTER — Ambulatory Visit (HOSPITAL_COMMUNITY)

## 2024-07-28 ENCOUNTER — Encounter (HOSPITAL_COMMUNITY): Payer: Self-pay

## 2024-07-28 ENCOUNTER — Ambulatory Visit (HOSPITAL_COMMUNITY)
Admission: RE | Admit: 2024-07-28 | Discharge: 2024-07-28 | Disposition: A | Discharge status: Home / Self Care | Source: Ambulatory Visit | Attending: Family Medicine | Admitting: Family Medicine

## 2024-07-28 DIAGNOSIS — Z1231 Encounter for screening mammogram for malignant neoplasm of breast: Secondary | ICD-10-CM | POA: Diagnosis present

## 2024-08-25 ENCOUNTER — Ambulatory Visit (HOSPITAL_COMMUNITY): Admitting: Registered Nurse

## 2024-09-09 ENCOUNTER — Ambulatory Visit: Payer: Medicare Other

## 2024-09-15 ENCOUNTER — Encounter: Admitting: Adult Health

## 2024-10-31 ENCOUNTER — Ambulatory Visit: Payer: Self-pay | Admitting: Family Medicine

## 2024-11-07 ENCOUNTER — Ambulatory Visit: Admitting: Family Medicine
